# Patient Record
Sex: Female | Born: 1945 | Race: White | Hispanic: No | Smoking: Never smoker
Health system: Southern US, Community
[De-identification: ages and names within clinical notes are randomized; demographics above are authoritative.]

## PROBLEM LIST (undated history)

## (undated) DIAGNOSIS — N959 Unspecified menopausal and perimenopausal disorder: Secondary | ICD-10-CM

## (undated) DIAGNOSIS — M545 Low back pain, unspecified: Secondary | ICD-10-CM

## (undated) DIAGNOSIS — I495 Sick sinus syndrome: Secondary | ICD-10-CM

## (undated) DIAGNOSIS — E039 Hypothyroidism, unspecified: Secondary | ICD-10-CM

## (undated) DIAGNOSIS — M810 Age-related osteoporosis without current pathological fracture: Secondary | ICD-10-CM

## (undated) DIAGNOSIS — L259 Unspecified contact dermatitis, unspecified cause: Secondary | ICD-10-CM

## (undated) DIAGNOSIS — R252 Cramp and spasm: Secondary | ICD-10-CM

## (undated) DIAGNOSIS — E785 Hyperlipidemia, unspecified: Secondary | ICD-10-CM

## (undated) DIAGNOSIS — R12 Heartburn: Secondary | ICD-10-CM

## (undated) DIAGNOSIS — D151 Benign neoplasm of heart: Secondary | ICD-10-CM

## (undated) DIAGNOSIS — E119 Type 2 diabetes mellitus without complications: Secondary | ICD-10-CM

## (undated) DIAGNOSIS — F32A Depression, unspecified: Secondary | ICD-10-CM

## (undated) DIAGNOSIS — I10 Essential (primary) hypertension: Secondary | ICD-10-CM

## (undated) DIAGNOSIS — Z9861 Coronary angioplasty status: Secondary | ICD-10-CM

## (undated) DIAGNOSIS — I428 Other cardiomyopathies: Secondary | ICD-10-CM

## (undated) DIAGNOSIS — I498 Other specified cardiac arrhythmias: Secondary | ICD-10-CM

## (undated) DIAGNOSIS — I480 Paroxysmal atrial fibrillation: Secondary | ICD-10-CM

## (undated) DIAGNOSIS — T148XXA Other injury of unspecified body region, initial encounter: Secondary | ICD-10-CM

## (undated) DIAGNOSIS — I5022 Chronic systolic (congestive) heart failure: Secondary | ICD-10-CM

## (undated) DIAGNOSIS — I2510 Atherosclerotic heart disease of native coronary artery without angina pectoris: Secondary | ICD-10-CM

## (undated) HISTORY — DX: Atherosclerotic heart disease of native coronary artery without angina pectoris: I25.10

## (undated) HISTORY — DX: Unspecified menopausal and perimenopausal disorder: N95.9

## (undated) HISTORY — DX: Sick sinus syndrome: I49.5

## (undated) HISTORY — DX: Heartburn: R12

## (undated) HISTORY — DX: Cramp and spasm: R25.2

## (undated) HISTORY — DX: Other specified cardiac arrhythmias: I49.8

## (undated) HISTORY — DX: Type 2 diabetes mellitus without complications: E11.9

## (undated) HISTORY — DX: Depression, unspecified: F32.A

## (undated) HISTORY — DX: Benign neoplasm of heart: D15.1

## (undated) HISTORY — DX: Chronic systolic (congestive) heart failure: I50.22

## (undated) HISTORY — DX: Coronary angioplasty status: Z98.61

## (undated) HISTORY — PX: ATRIAL MYXOMA EXCISION: SHX1198

## (undated) HISTORY — DX: Unspecified contact dermatitis, unspecified cause: L25.9

## (undated) HISTORY — DX: Essential (primary) hypertension: I10

## (undated) HISTORY — DX: Low back pain: M54.5

## (undated) HISTORY — DX: Paroxysmal atrial fibrillation: I48.0

## (undated) HISTORY — DX: Age-related osteoporosis without current pathological fracture: M81.0

## (undated) HISTORY — PX: VAGINAL HYSTERECTOMY: SUR661

## (undated) HISTORY — DX: Hyperlipidemia, unspecified: E78.5

## (undated) HISTORY — DX: Hypothyroidism, unspecified: E03.9

## (undated) HISTORY — DX: Low back pain, unspecified: M54.50

## (undated) HISTORY — DX: Other cardiomyopathies: I42.8

## (undated) HISTORY — DX: Other injury of unspecified body region, initial encounter: T14.8XXA

---

## 2003-09-05 ENCOUNTER — Other Ambulatory Visit: Payer: Self-pay

## 2004-06-29 ENCOUNTER — Emergency Department: Payer: Self-pay | Admitting: Internal Medicine

## 2004-11-23 ENCOUNTER — Emergency Department: Payer: Self-pay | Admitting: Internal Medicine

## 2005-09-25 ENCOUNTER — Emergency Department: Payer: Self-pay | Admitting: Internal Medicine

## 2005-09-25 ENCOUNTER — Other Ambulatory Visit: Payer: Self-pay

## 2005-12-13 ENCOUNTER — Emergency Department: Payer: Self-pay | Admitting: Unknown Physician Specialty

## 2006-08-30 ENCOUNTER — Other Ambulatory Visit: Payer: Self-pay

## 2006-08-30 ENCOUNTER — Emergency Department: Payer: Self-pay | Admitting: General Practice

## 2007-05-29 ENCOUNTER — Emergency Department: Payer: Self-pay

## 2007-08-10 ENCOUNTER — Emergency Department: Payer: Self-pay | Admitting: Emergency Medicine

## 2007-10-26 ENCOUNTER — Emergency Department: Payer: Self-pay | Admitting: Emergency Medicine

## 2008-02-01 ENCOUNTER — Emergency Department: Payer: Self-pay | Admitting: Emergency Medicine

## 2008-09-05 ENCOUNTER — Emergency Department: Payer: Self-pay | Admitting: Emergency Medicine

## 2009-06-07 HISTORY — PX: CORONARY ARTERY BYPASS GRAFT: SHX141

## 2010-03-04 ENCOUNTER — Ambulatory Visit: Payer: Self-pay | Admitting: Gastroenterology

## 2010-03-06 LAB — PATHOLOGY REPORT

## 2010-06-07 HISTORY — PX: CARDIAC CATHETERIZATION: SHX172

## 2010-08-11 ENCOUNTER — Emergency Department: Payer: Self-pay | Admitting: Emergency Medicine

## 2011-07-07 ENCOUNTER — Ambulatory Visit: Payer: Self-pay | Admitting: Family Medicine

## 2011-07-07 DIAGNOSIS — Z1231 Encounter for screening mammogram for malignant neoplasm of breast: Secondary | ICD-10-CM | POA: Diagnosis not present

## 2011-10-01 DIAGNOSIS — I2510 Atherosclerotic heart disease of native coronary artery without angina pectoris: Secondary | ICD-10-CM | POA: Diagnosis not present

## 2011-10-01 DIAGNOSIS — E119 Type 2 diabetes mellitus without complications: Secondary | ICD-10-CM | POA: Diagnosis not present

## 2011-10-01 DIAGNOSIS — E039 Hypothyroidism, unspecified: Secondary | ICD-10-CM | POA: Diagnosis not present

## 2011-10-01 DIAGNOSIS — R12 Heartburn: Secondary | ICD-10-CM | POA: Diagnosis not present

## 2011-10-12 DIAGNOSIS — R011 Cardiac murmur, unspecified: Secondary | ICD-10-CM | POA: Diagnosis not present

## 2011-10-12 DIAGNOSIS — I209 Angina pectoris, unspecified: Secondary | ICD-10-CM | POA: Diagnosis not present

## 2011-10-12 DIAGNOSIS — R0609 Other forms of dyspnea: Secondary | ICD-10-CM | POA: Diagnosis not present

## 2012-01-19 DIAGNOSIS — E039 Hypothyroidism, unspecified: Secondary | ICD-10-CM | POA: Diagnosis not present

## 2012-01-19 DIAGNOSIS — E119 Type 2 diabetes mellitus without complications: Secondary | ICD-10-CM | POA: Diagnosis not present

## 2012-01-19 DIAGNOSIS — E785 Hyperlipidemia, unspecified: Secondary | ICD-10-CM | POA: Diagnosis not present

## 2012-01-19 DIAGNOSIS — I10 Essential (primary) hypertension: Secondary | ICD-10-CM | POA: Diagnosis not present

## 2012-01-19 DIAGNOSIS — R252 Cramp and spasm: Secondary | ICD-10-CM | POA: Diagnosis not present

## 2012-01-24 DIAGNOSIS — I2510 Atherosclerotic heart disease of native coronary artery without angina pectoris: Secondary | ICD-10-CM | POA: Diagnosis not present

## 2012-01-24 DIAGNOSIS — R0609 Other forms of dyspnea: Secondary | ICD-10-CM | POA: Diagnosis not present

## 2012-01-24 DIAGNOSIS — R0989 Other specified symptoms and signs involving the circulatory and respiratory systems: Secondary | ICD-10-CM | POA: Diagnosis not present

## 2012-03-08 DIAGNOSIS — E119 Type 2 diabetes mellitus without complications: Secondary | ICD-10-CM | POA: Diagnosis not present

## 2012-03-08 DIAGNOSIS — E039 Hypothyroidism, unspecified: Secondary | ICD-10-CM | POA: Diagnosis not present

## 2012-03-08 DIAGNOSIS — E785 Hyperlipidemia, unspecified: Secondary | ICD-10-CM | POA: Diagnosis not present

## 2012-03-08 DIAGNOSIS — Z23 Encounter for immunization: Secondary | ICD-10-CM | POA: Diagnosis not present

## 2012-03-08 DIAGNOSIS — I10 Essential (primary) hypertension: Secondary | ICD-10-CM | POA: Diagnosis not present

## 2012-07-11 DIAGNOSIS — Z0000 Encounter for general adult medical examination without abnormal findings: Secondary | ICD-10-CM | POA: Diagnosis not present

## 2012-07-11 DIAGNOSIS — Z1211 Encounter for screening for malignant neoplasm of colon: Secondary | ICD-10-CM | POA: Diagnosis not present

## 2012-07-11 DIAGNOSIS — Z1331 Encounter for screening for depression: Secondary | ICD-10-CM | POA: Diagnosis not present

## 2012-07-11 DIAGNOSIS — Z1239 Encounter for other screening for malignant neoplasm of breast: Secondary | ICD-10-CM | POA: Diagnosis not present

## 2012-07-11 DIAGNOSIS — Z1159 Encounter for screening for other viral diseases: Secondary | ICD-10-CM | POA: Diagnosis not present

## 2012-07-26 DIAGNOSIS — I209 Angina pectoris, unspecified: Secondary | ICD-10-CM | POA: Diagnosis not present

## 2012-07-26 DIAGNOSIS — R011 Cardiac murmur, unspecified: Secondary | ICD-10-CM | POA: Diagnosis not present

## 2012-08-28 DIAGNOSIS — I2510 Atherosclerotic heart disease of native coronary artery without angina pectoris: Secondary | ICD-10-CM | POA: Diagnosis not present

## 2012-08-28 DIAGNOSIS — I10 Essential (primary) hypertension: Secondary | ICD-10-CM | POA: Diagnosis not present

## 2012-08-28 DIAGNOSIS — I209 Angina pectoris, unspecified: Secondary | ICD-10-CM | POA: Diagnosis not present

## 2012-08-30 ENCOUNTER — Ambulatory Visit: Payer: Self-pay | Admitting: General Surgery

## 2012-08-30 DIAGNOSIS — Z5309 Procedure and treatment not carried out because of other contraindication: Secondary | ICD-10-CM | POA: Diagnosis not present

## 2012-08-30 DIAGNOSIS — Z7982 Long term (current) use of aspirin: Secondary | ICD-10-CM | POA: Diagnosis not present

## 2012-08-30 DIAGNOSIS — Z1211 Encounter for screening for malignant neoplasm of colon: Secondary | ICD-10-CM

## 2012-08-30 DIAGNOSIS — E119 Type 2 diabetes mellitus without complications: Secondary | ICD-10-CM | POA: Diagnosis not present

## 2012-08-30 DIAGNOSIS — Z79899 Other long term (current) drug therapy: Secondary | ICD-10-CM | POA: Diagnosis not present

## 2012-08-30 DIAGNOSIS — Z951 Presence of aortocoronary bypass graft: Secondary | ICD-10-CM | POA: Diagnosis not present

## 2012-08-30 DIAGNOSIS — K644 Residual hemorrhoidal skin tags: Secondary | ICD-10-CM | POA: Diagnosis not present

## 2012-08-30 DIAGNOSIS — I10 Essential (primary) hypertension: Secondary | ICD-10-CM | POA: Diagnosis not present

## 2012-08-30 DIAGNOSIS — K5730 Diverticulosis of large intestine without perforation or abscess without bleeding: Secondary | ICD-10-CM

## 2012-08-30 DIAGNOSIS — E7800 Pure hypercholesterolemia, unspecified: Secondary | ICD-10-CM | POA: Diagnosis not present

## 2012-09-04 ENCOUNTER — Encounter: Payer: Self-pay | Admitting: General Surgery

## 2012-09-05 ENCOUNTER — Ambulatory Visit: Payer: Self-pay | Admitting: Family Medicine

## 2012-09-05 DIAGNOSIS — M545 Low back pain, unspecified: Secondary | ICD-10-CM | POA: Diagnosis not present

## 2012-09-05 DIAGNOSIS — I10 Essential (primary) hypertension: Secondary | ICD-10-CM | POA: Diagnosis not present

## 2012-09-05 DIAGNOSIS — E039 Hypothyroidism, unspecified: Secondary | ICD-10-CM | POA: Diagnosis not present

## 2012-09-05 DIAGNOSIS — E119 Type 2 diabetes mellitus without complications: Secondary | ICD-10-CM | POA: Diagnosis not present

## 2012-09-05 DIAGNOSIS — M539 Dorsopathy, unspecified: Secondary | ICD-10-CM | POA: Diagnosis not present

## 2012-09-21 DIAGNOSIS — I498 Other specified cardiac arrhythmias: Secondary | ICD-10-CM | POA: Diagnosis not present

## 2012-09-21 DIAGNOSIS — I10 Essential (primary) hypertension: Secondary | ICD-10-CM | POA: Diagnosis not present

## 2012-10-20 DIAGNOSIS — I2510 Atherosclerotic heart disease of native coronary artery without angina pectoris: Secondary | ICD-10-CM | POA: Diagnosis not present

## 2012-10-20 DIAGNOSIS — R252 Cramp and spasm: Secondary | ICD-10-CM | POA: Diagnosis not present

## 2012-10-20 DIAGNOSIS — I10 Essential (primary) hypertension: Secondary | ICD-10-CM | POA: Diagnosis not present

## 2012-11-20 DIAGNOSIS — T148XXA Other injury of unspecified body region, initial encounter: Secondary | ICD-10-CM | POA: Diagnosis not present

## 2012-11-20 DIAGNOSIS — N959 Unspecified menopausal and perimenopausal disorder: Secondary | ICD-10-CM | POA: Diagnosis not present

## 2012-11-20 DIAGNOSIS — I10 Essential (primary) hypertension: Secondary | ICD-10-CM | POA: Diagnosis not present

## 2012-11-22 ENCOUNTER — Ambulatory Visit: Payer: Self-pay | Admitting: Family Medicine

## 2012-11-22 DIAGNOSIS — Z780 Asymptomatic menopausal state: Secondary | ICD-10-CM | POA: Diagnosis not present

## 2012-11-22 DIAGNOSIS — N959 Unspecified menopausal and perimenopausal disorder: Secondary | ICD-10-CM | POA: Diagnosis not present

## 2012-11-22 DIAGNOSIS — M810 Age-related osteoporosis without current pathological fracture: Secondary | ICD-10-CM | POA: Diagnosis not present

## 2012-12-21 DIAGNOSIS — R000 Tachycardia, unspecified: Secondary | ICD-10-CM | POA: Diagnosis not present

## 2012-12-21 DIAGNOSIS — M810 Age-related osteoporosis without current pathological fracture: Secondary | ICD-10-CM | POA: Diagnosis not present

## 2012-12-21 DIAGNOSIS — I10 Essential (primary) hypertension: Secondary | ICD-10-CM | POA: Diagnosis not present

## 2013-01-01 ENCOUNTER — Encounter: Payer: Self-pay | Admitting: *Deleted

## 2013-01-12 ENCOUNTER — Encounter: Payer: Self-pay | Admitting: Cardiovascular Disease

## 2013-01-12 ENCOUNTER — Telehealth: Payer: Self-pay | Admitting: *Deleted

## 2013-01-12 ENCOUNTER — Ambulatory Visit (INDEPENDENT_AMBULATORY_CARE_PROVIDER_SITE_OTHER): Payer: Medicare Other | Admitting: Cardiovascular Disease

## 2013-01-12 VITALS — BP 134/84 | HR 49 | Ht 59.0 in | Wt 152.5 lb

## 2013-01-12 DIAGNOSIS — R002 Palpitations: Secondary | ICD-10-CM | POA: Diagnosis not present

## 2013-01-12 DIAGNOSIS — I10 Essential (primary) hypertension: Secondary | ICD-10-CM | POA: Insufficient documentation

## 2013-01-12 DIAGNOSIS — R000 Tachycardia, unspecified: Secondary | ICD-10-CM | POA: Diagnosis not present

## 2013-01-12 DIAGNOSIS — I2510 Atherosclerotic heart disease of native coronary artery without angina pectoris: Secondary | ICD-10-CM | POA: Insufficient documentation

## 2013-01-12 DIAGNOSIS — D151 Benign neoplasm of heart: Secondary | ICD-10-CM | POA: Diagnosis not present

## 2013-01-12 MED ORDER — METOPROLOL SUCCINATE ER 50 MG PO TB24: 50.0000 mg | ORAL_TABLET | Freq: Two times a day (BID) | ORAL | Status: DC

## 2013-01-12 NOTE — Telephone Encounter (Signed)
Faxed order to [facility] for pt to receive 48 hour holter monitor. Elaine/Kayron will contact pt and let her know when she can come in to have 48 hour holter monitor placed.

## 2013-01-12 NOTE — Assessment & Plan Note (Signed)
She had recurrent atypical chest pain since her CABG. Most recent stress test was in March of this year which showed no evidence of ischemia with normal ejection fraction.

## 2013-01-12 NOTE — Telephone Encounter (Signed)
Message copied by Kendrick Fries on Fri Jan 12, 2013 12:11 PM ------      Message from: Oneida Arenas      Created: Fri Jan 12, 2013 11:42 AM      Regarding: Holter Monitor       Needs 48 hour holter monitor from [facility]. ------

## 2013-01-12 NOTE — Assessment & Plan Note (Signed)
The etiology of this is and not entirely clear. Although his symptoms improved after increasing the dose of Toprol, she is now ready bradycardic with a heart rate of 49 beats per minute. Thus, I will decrease the dose back to 50 mg once daily and request a 48-hour Holter monitor for evaluation.

## 2013-01-12 NOTE — Progress Notes (Signed)
HPI  This is a 67 year old female who was referred by Dr. Thana Ates for evaluation of palpitations and tachycardia. She had previous atrial myxoma resection and one-vessel CABG in 2012 done at [facility]. She has multiple chronic medical conditions including hypertension, hyperlipidemia and diabetes. She had recurrent chest pain post CABG. She underwent a nuclear stress test in March of 2014 by Dr. Juliann Pares, her previous cardiologist which showed no evidence of ischemia with normal ejection fraction. She reports chest pain when she is under stress. She has been having palpitations. It was mentioned that she was tachycardic during a recent routine visit. However, by vital signs and heart rate was 80 beats per minute. Due to her symptoms, the dose of Toprol was increased to 100 mg once daily. She reports improvement and palpitations but she is more tired with a heart rate of 49 beats per minute today. She complains of exertional dyspnea. No orthopnea or PND.  No Known Allergies   Current Outpatient Prescriptions on File Prior to Visit  Medication Sig Dispense Refill  . amLODipine (NORVASC) 10 MG tablet Take 10 mg by mouth daily.      Marland Kitchen aspirin 81 MG tablet Take 81 mg by mouth daily.      Marland Kitchen atorvastatin (LIPITOR) 80 MG tablet Take 80 mg by mouth daily.      . chlorthalidone (HYGROTON) 25 MG tablet Take 25 mg by mouth daily.      . furosemide (LASIX) 20 MG tablet Take 20 mg by mouth as needed.      Marland Kitchen levothyroxine (SYNTHROID, LEVOTHROID) 50 MCG tablet Take 50 mcg by mouth daily before breakfast.      . magnesium oxide (MAG-OX) 400 MG tablet Take 400 mg by mouth 2 (two) times daily.      . metFORMIN (GLUCOPHAGE) 500 MG tablet Take 500 mg by mouth daily with breakfast.      . ranitidine (ZANTAC) 300 MG tablet Take 300 mg by mouth at bedtime.       No current facility-administered medications on file prior to visit.     Past Medical History  Diagnosis Date  . Postsurgical percutaneous transluminal  coronary angioplasty status   . Type II or unspecified type diabetes mellitus without mention of complication, not stated as uncontrolled   . Other and unspecified hyperlipidemia   . Osteoporosis, unspecified   . Lumbago   . Unspecified hypothyroidism   . Contusion of unspecified site   . Unspecified menopausal and postmenopausal disorder   . Cramp of limb   . Heartburn   . Contact dermatitis and other eczema, due to unspecified cause   . Other specified cardiac dysrhythmias(427.89)   . Benign neoplasm of heart   . Essential hypertension, benign   . Atrial myxoma     Status post resection in 2012 at [facility]  . Coronary atherosclerosis of unspecified type of vessel, native or graft     Status post CABG in March of 2012 at the time of atrial myxoma resection with LIMA to LAD done at [facility]     Past Surgical History  Procedure Laterality Date  . Atrial myxoma excision Left   . Vaginal hysterectomy    . Cardiac catheterization  2011    [facility]; right and left heart 75% ostial lesion  . Coronary artery bypass graft  2011    [facility]     Family History  Problem Relation Age of Onset  . Heart failure Mother      History  Social History  . Marital Status: Married    Spouse Name: N/A    Number of Children: N/A  . Years of Education: N/A   Occupational History  . Not on file.   Social History Main Topics  . Smoking status: Never Smoker   . Smokeless tobacco: Not on file  . Alcohol Use: No  . Drug Use: No  . Sexually Active: Not on file   Other Topics Concern  . Not on file   Social History Narrative  . No narrative on file     ROS Constitutional: Negative for fever, chills, diaphoresis, activity change, appetite change and fatigue.  HENT: Negative for hearing loss, nosebleeds, congestion, sore throat, facial swelling, drooling, trouble swallowing, neck pain, voice change, sinus pressure and tinnitus.  Eyes: Negative for photophobia, pain, discharge and visual disturbance.    Respiratory: Negative for apnea, cough and wheezing.  Cardiovascular: Negative for  leg swelling.  Gastrointestinal: Negative for nausea, vomiting, abdominal pain, diarrhea, constipation, blood in stool and abdominal distention.  Genitourinary: Negative for dysuria, urgency, frequency, hematuria and decreased urine volume.  Musculoskeletal: Negative for myalgias, back pain, joint swelling, arthralgias and gait problem.  Skin: Negative for color change, pallor, rash and wound.  Neurological: Negative for dizziness, tremors, seizures, syncope, speech difficulty, weakness, light-headedness, numbness and headaches.  Psychiatric/Behavioral: Negative for suicidal ideas, hallucinations, behavioral problems and agitation. The patient is not nervous/anxious.     PHYSICAL EXAM   BP 134/84  Pulse 49  Ht 4\' 11"  (1.499 m)  Wt 152 lb 8 oz (69.174 kg)  BMI 30.79 kg/m2 Constitutional: She is oriented to person, place, and time. She appears well-developed and well-nourished. No distress.  HENT: No nasal discharge.  Head: Normocephalic and atraumatic.  Eyes: Pupils are equal and round. Right eye exhibits no discharge. Left eye exhibits no discharge.  Neck: Normal range of motion. Neck supple. No JVD present. No thyromegaly present.  Cardiovascular: Normal rate, regular rhythm, normal heart sounds. Exam reveals no gallop and no friction rub. No murmur heard.  Pulmonary/Chest: Effort normal and breath sounds normal. No stridor. No respiratory distress. She has no wheezes. She has no rales. She exhibits no tenderness.  Abdominal: Soft. Bowel sounds are normal. She exhibits no distension. There is no tenderness. There is no rebound and no guarding.  Musculoskeletal: Normal range of motion. She exhibits no edema and no tenderness.  Neurological: She is alert and oriented to person, place, and time. Coordination normal.  Skin: Skin is warm and dry. No rash noted. She is not diaphoretic. No erythema. No  pallor.  Psychiatric: She has a normal mood and affect. Her behavior is normal. Judgment and thought content normal.     EKG: Marked   sinus Bradycardia  -  Nonspecific T-abnormality.   ABNORMAL     ASSESSMENT AND PLAN

## 2013-01-12 NOTE — Patient Instructions (Addendum)
Decrease Metoprolol (Toprol) back to 50 mg once daily.   Your physician has requested that you have an echocardiogram. Echocardiography is a painless test that uses sound waves to create images of your heart. It provides your doctor with information about the size and shape of your heart and how well your heart's chambers and valves are working. This procedure takes approximately one hour. There are no restrictions for this procedure.  Your physician has recommended that you wear a holter monitor. Holter monitors are medical devices that record the heart's electrical activity. Doctors most often use these monitors to diagnose arrhythmias. Arrhythmias are problems with the speed or rhythm of the heartbeat. The monitor is a small, portable device. You can wear one while you do your normal daily activities. This is usually used to diagnose what is causing palpitations/syncope (passing out).  Follow up in 1 month

## 2013-01-12 NOTE — Assessment & Plan Note (Signed)
She had previous atrial myxoma resection. A yearly echocardiogram is recommended to ensure no recurrent tumor. I requested an echocardiogram for evaluation.

## 2013-01-13 IMAGING — MG MM CAD SCREENING MAMMO
1 series · 5 of 5 positions shown · non-contrast
Comparison: none

REASON FOR EXAM: scr mammo no order
COMMENTS:

[R CC · right · 5 of 5 slices shown]
[im 1/5]
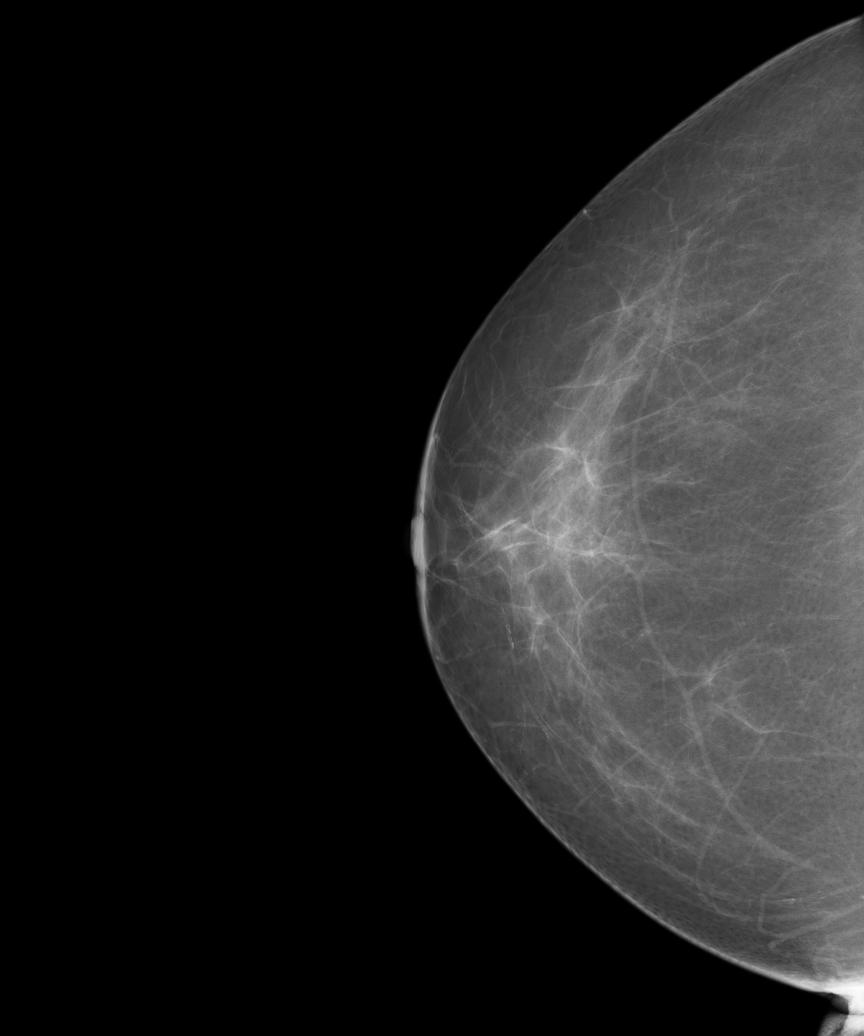
[im 2/5]
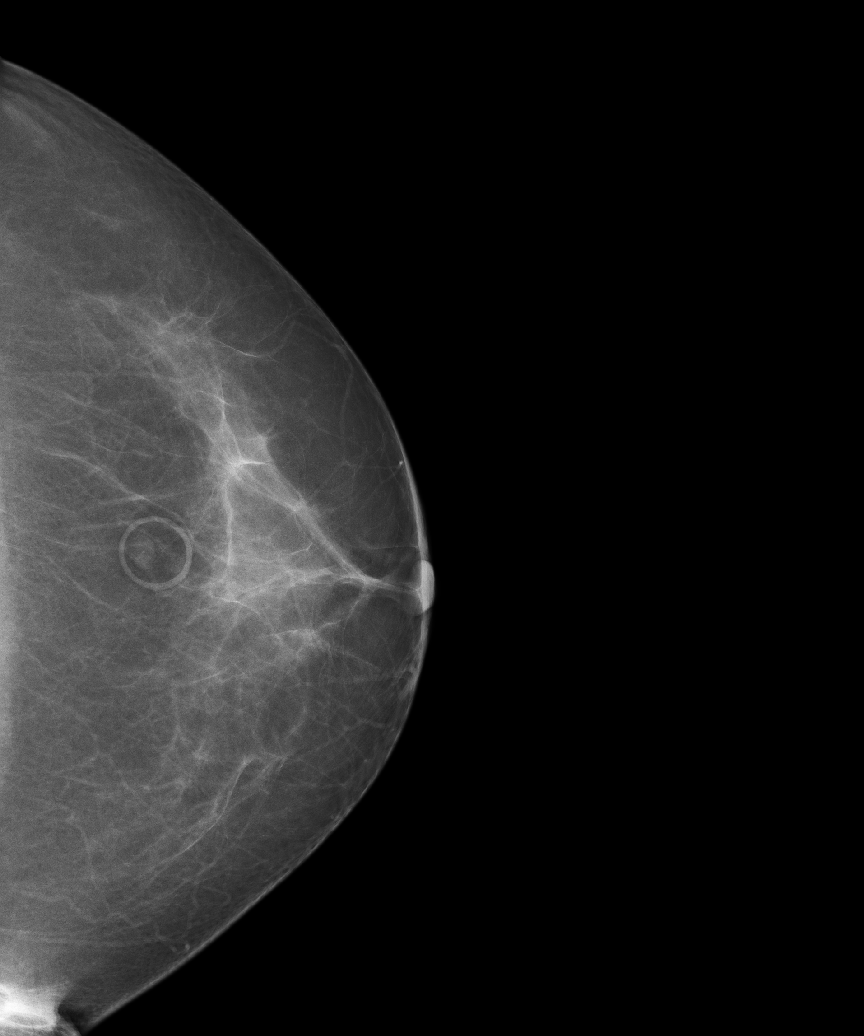
[im 3/5]
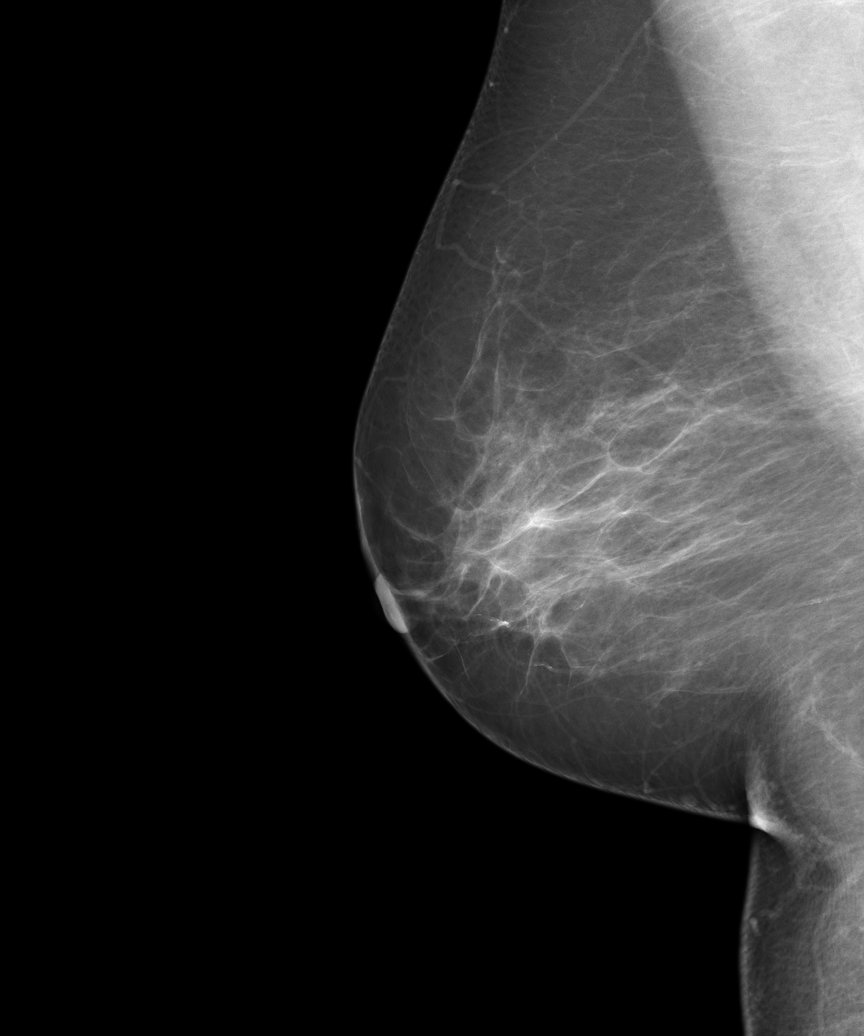
[im 4/5]
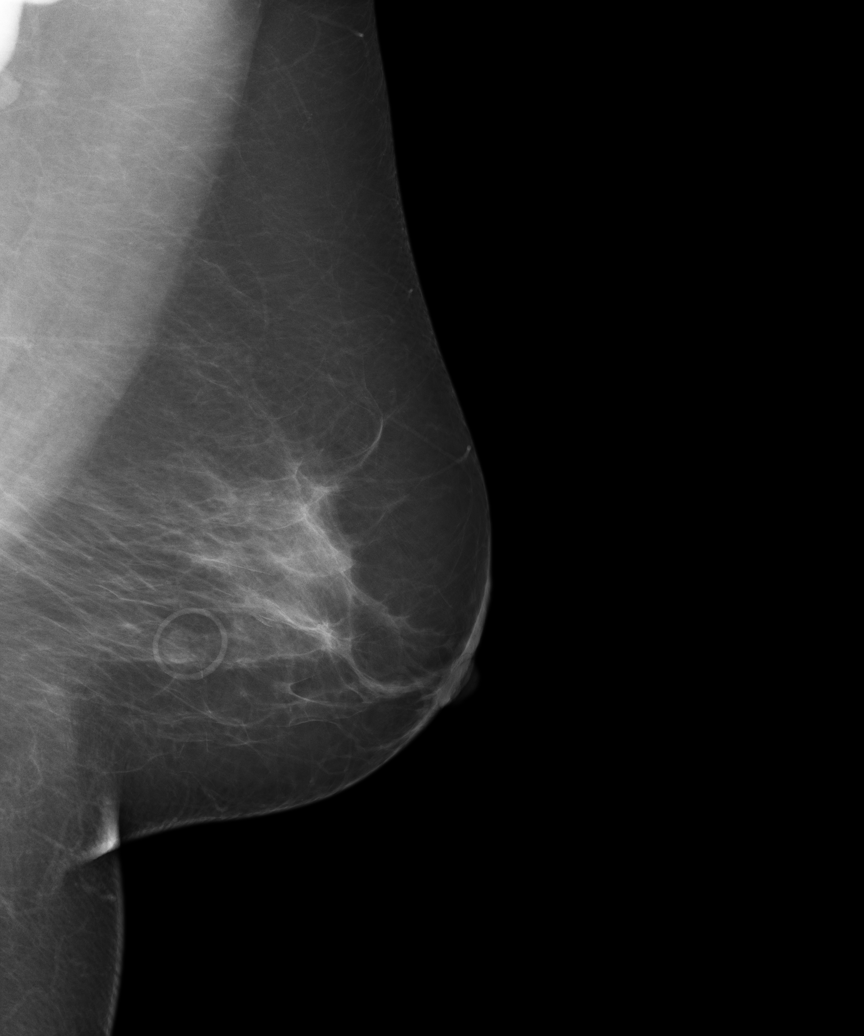
[im 5/5]
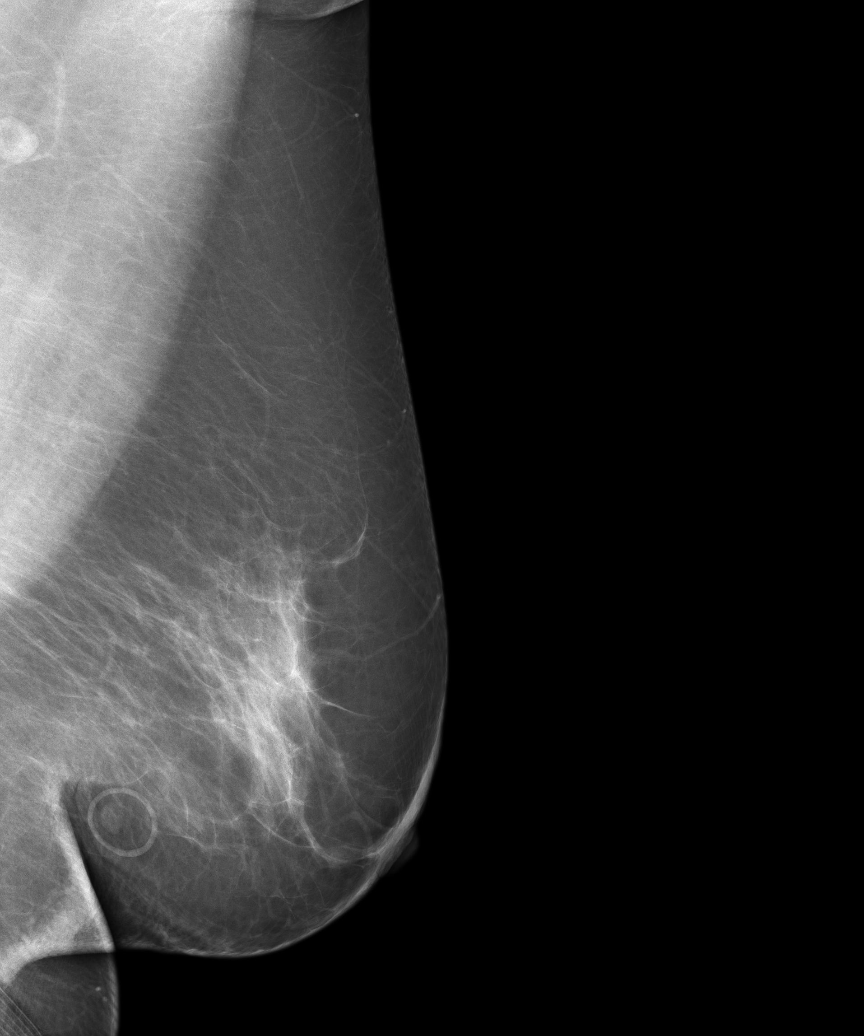

[5 of 5 positions shown; findings below may reference images not displayed]

PROCEDURE:     MAM - MAM DGTL SCRN MAM NO ORDER W/CAD  - July 07, 2011  [DATE]

RESULT:     This is a baseline mammogram. There is no known family history
of breast cancer. The breasts exhibit a mild parenchymal density. A skin
lesion is marked in the 6 o'clock region of the left breast. There is no
evidence of an area of dominant mass or malignant-appearing calcification.
No architectural distortion is present.
IMPRESSION: 1. Benign-appearing bilateral mammogram.

BI-RADS: Category 1 - Negative

RECOMMENDATIONS:

1. Please continue to encourage appropriate mammographic follow-up.

A NEGATIVE MAMMOGRAM REPORT DOES NOT PRECLUDE BIOPSY OR OTHER EVALUATION OF
A CLINICALLY PALPABLE OR OTHERWISE SUSPICIOUS MASS OR LESION. BREAST CANCER
MAY NOT BE DETECTED BY MAMMOGRAPHY IN UP TO 10% OF CASES.

## 2013-01-16 ENCOUNTER — Other Ambulatory Visit: Payer: Medicare Other

## 2013-01-24 DIAGNOSIS — I10 Essential (primary) hypertension: Secondary | ICD-10-CM | POA: Diagnosis not present

## 2013-01-24 DIAGNOSIS — E119 Type 2 diabetes mellitus without complications: Secondary | ICD-10-CM | POA: Diagnosis not present

## 2013-01-24 DIAGNOSIS — E039 Hypothyroidism, unspecified: Secondary | ICD-10-CM | POA: Diagnosis not present

## 2013-01-24 DIAGNOSIS — E785 Hyperlipidemia, unspecified: Secondary | ICD-10-CM | POA: Diagnosis not present

## 2013-01-30 ENCOUNTER — Other Ambulatory Visit: Payer: Self-pay

## 2013-01-30 ENCOUNTER — Other Ambulatory Visit (INDEPENDENT_AMBULATORY_CARE_PROVIDER_SITE_OTHER): Payer: Medicare Other

## 2013-01-30 DIAGNOSIS — I059 Rheumatic mitral valve disease, unspecified: Secondary | ICD-10-CM | POA: Diagnosis not present

## 2013-01-30 DIAGNOSIS — R079 Chest pain, unspecified: Secondary | ICD-10-CM | POA: Diagnosis not present

## 2013-01-30 DIAGNOSIS — R002 Palpitations: Secondary | ICD-10-CM

## 2013-02-04 ENCOUNTER — Encounter: Payer: Self-pay | Admitting: Cardiovascular Disease

## 2013-02-05 HISTORY — PX: INSERT / REPLACE / REMOVE PACEMAKER: SUR710

## 2013-02-09 DIAGNOSIS — I209 Angina pectoris, unspecified: Secondary | ICD-10-CM | POA: Diagnosis not present

## 2013-02-09 DIAGNOSIS — I10 Essential (primary) hypertension: Secondary | ICD-10-CM | POA: Diagnosis not present

## 2013-02-09 DIAGNOSIS — I2510 Atherosclerotic heart disease of native coronary artery without angina pectoris: Secondary | ICD-10-CM | POA: Diagnosis not present

## 2013-02-12 ENCOUNTER — Encounter: Payer: Self-pay | Admitting: Cardiovascular Disease

## 2013-02-12 ENCOUNTER — Ambulatory Visit (INDEPENDENT_AMBULATORY_CARE_PROVIDER_SITE_OTHER): Payer: Medicare Other | Admitting: Cardiovascular Disease

## 2013-02-12 VITALS — BP 141/78 | HR 51 | Ht 60.0 in | Wt 156.0 lb

## 2013-02-12 DIAGNOSIS — R002 Palpitations: Secondary | ICD-10-CM | POA: Diagnosis not present

## 2013-02-12 DIAGNOSIS — I10 Essential (primary) hypertension: Secondary | ICD-10-CM | POA: Diagnosis not present

## 2013-02-12 DIAGNOSIS — I2510 Atherosclerotic heart disease of native coronary artery without angina pectoris: Secondary | ICD-10-CM

## 2013-02-12 DIAGNOSIS — D151 Benign neoplasm of heart: Secondary | ICD-10-CM | POA: Diagnosis not present

## 2013-02-12 NOTE — Patient Instructions (Addendum)
Your physician recommends that you continue on your current medications as directed. Please refer to the Current Medication list given to you today.  Your physician has recommended that you wear an 30 day event monitor. Event monitors are medical devices that record the heart's electrical activity. Doctors most often [place] these monitors to diagnose arrhythmias. Arrhythmias are problems with the speed or rhythm of the heartbeat. The monitor is a small, portable device. You can wear one while you do your normal daily activities. This is usually used to diagnose what is causing palpitations/syncope (passing out).  Your physician recommends that you schedule a follow-up appointment in: 2 months with Dr. Kirke Corin

## 2013-02-12 NOTE — Assessment & Plan Note (Signed)
Recent echo showed no evidence of recurrent myxoma.

## 2013-02-12 NOTE — Assessment & Plan Note (Signed)
The patient is having intermittent episodes of tachycardia of unclear etiology. Symptoms include palpitations and dizziness without syncope or presyncope. She was noted to be tachycardic during the echocardiogram. It was possibly multifocal atrial tachycardia given that the rhythm was irregular but A waves were present on Doppler. This makes it fibrillation less likely. Current episodes are happening at an average of once weekly. Thus, a Holter monitor will probably be not sufficient to capture this.  Thus, I recommend a two-week outpatient telemetry. Continue current dose of Toprol. She has baseline bradycardia.

## 2013-02-12 NOTE — Assessment & Plan Note (Signed)
She has no symptoms suggestive of angina. Continue medical therapy. 

## 2013-02-12 NOTE — Progress Notes (Signed)
HPI  This is a 67 year old female who is here today for a followup visit regarding palpitations and tachycardia. She had previous atrial myxoma resection and one-vessel CABG in 2012 done at [facility]. She has known nonischemic cardiomyopathy with previous ejection fraction of 45%. She has multiple chronic medical conditions including hypertension, hyperlipidemia and diabetes. She had recurrent chest pain post CABG. She underwent a nuclear stress test in March of 2014 by Dr. Juliann Pares, her previous cardiologist which showed no evidence of ischemia with normal ejection fraction. She reports chest pain when she is under stress.  She was seen recently for palpitations and intermittent tachycardia. Toprol was increased to 100 mg once daily. During my initial evaluation she was noted to be bradycardic with a heart rate of  49 beats per minute with significant fatigue. Thus, I decreased the dose of Toprol back to 50 mg once daily. I scheduled her for an echocardiogram which showed mildly reduced LV systolic function with an EF of 45-50% which is stable with mild to moderate mitral regurgitation and moderate tricuspid regurgitation. She was noted to be tachycardic during the echocardiogram with a heart rate around 110 beats per minute. She reports having palpitations and tachycardia when she is upset and under distress. I requested a 48-hour Holter monitor but she never picked up the monitor .  She reports now that her symptoms are happening on average of once a week. She feels fast heartbeats associated with mild dizziness but no syncope or presyncope.    No Known Allergies   Current Outpatient Prescriptions on File Prior to Visit  Medication Sig Dispense Refill  . amLODipine (NORVASC) 10 MG tablet Take 10 mg by mouth daily.      Marland Kitchen aspirin 81 MG tablet Take 81 mg by mouth daily.      Marland Kitchen atorvastatin (LIPITOR) 80 MG tablet Take 80 mg by mouth daily.      . chlorthalidone (HYGROTON) 25 MG tablet Take 25 mg by mouth  daily.      . furosemide (LASIX) 20 MG tablet Take 20 mg by mouth as needed.      Marland Kitchen levothyroxine (SYNTHROID, LEVOTHROID) 50 MCG tablet Take 50 mcg by mouth daily before breakfast.      . magnesium oxide (MAG-OX) 400 MG tablet Take 400 mg by mouth 2 (two) times daily.      . metFORMIN (GLUCOPHAGE) 500 MG tablet Take 500 mg by mouth daily with breakfast.      . metoprolol succinate (TOPROL-XL) 50 MG 24 hr tablet Take 1 tablet (50 mg total) by mouth 2 (two) times daily. Take with or immediately following a meal.      . ranitidine (ZANTAC) 300 MG tablet Take 300 mg by mouth at bedtime.       No current facility-administered medications on file prior to visit.     Past Medical History  Diagnosis Date  . Postsurgical percutaneous transluminal coronary angioplasty status   . Type II or unspecified type diabetes mellitus without mention of complication, not stated as uncontrolled   . Other and unspecified hyperlipidemia   . Osteoporosis, unspecified   . Lumbago   . Unspecified hypothyroidism   . Contusion of unspecified site   . Unspecified menopausal and postmenopausal disorder   . Cramp of limb   . Heartburn   . Contact dermatitis and other eczema, due to unspecified cause   . Other specified cardiac dysrhythmias(427.89)   . Benign neoplasm of heart   . Essential hypertension, benign   .  Atrial myxoma     Status post resection in 2012 at [facility]  . Coronary atherosclerosis of unspecified type of vessel, native or graft     Status post CABG in March of 2012 at the time of atrial myxoma resection with LIMA to LAD done at [facility]  . Nonischemic cardiomyopathy     EF: 45% in 2012     Past Surgical History  Procedure Laterality Date  . Atrial myxoma excision Left   . Vaginal hysterectomy    . Coronary artery bypass graft  2011    [facility]  . Cardiac catheterization  2012    [facility]; right and left heart 75% ostial LAD lesion     Family History  Problem Relation Age of Onset  . Heart failure  Mother      History   Social History  . Marital Status: Married    Spouse Name: N/A    Number of Children: N/A  . Years of Education: N/A   Occupational History  . Not on file.   Social History Main Topics  . Smoking status: Never Smoker   . Smokeless tobacco: Not on file  . Alcohol Use: No  . Drug Use: No  . Sexual Activity: Not on file   Other Topics Concern  . Not on file   Social History Narrative  . No narrative on file       PHYSICAL EXAM   There were no vitals taken for this visit. Constitutional: She is oriented to person, place, and time. She appears well-developed and well-nourished. No distress.  HENT: No nasal discharge.  Head: Normocephalic and atraumatic.  Eyes: Pupils are equal and round. Right eye exhibits no discharge. Left eye exhibits no discharge.  Neck: Normal range of motion. Neck supple. No JVD present. No thyromegaly present.  Cardiovascular: Normal rate, regular rhythm, normal heart sounds. Exam reveals no gallop and no friction rub. No murmur heard.  Pulmonary/Chest: Effort normal and breath sounds normal. No stridor. No respiratory distress. She has no wheezes. She has no rales. She exhibits no tenderness.  Abdominal: Soft. Bowel sounds are normal. She exhibits no distension. There is no tenderness. There is no rebound and no guarding.  Musculoskeletal: Normal range of motion. She exhibits no edema and no tenderness.  Neurological: She is alert and oriented to person, place, and time. Coordination normal.  Skin: Skin is warm and dry. No rash noted. She is not diaphoretic. No erythema. No pallor.  Psychiatric: She has a normal mood and affect. Her behavior is normal. Judgment and thought content normal.     EKG: Sinus bradycardia with PACs and sinus arrhythmia -  Nonspecific T-abnormality.   Low voltage -possible pulmonary disease.   ABNORMAL    ASSESSMENT AND PLAN

## 2013-02-13 ENCOUNTER — Telehealth: Payer: Self-pay

## 2013-02-13 NOTE — Telephone Encounter (Signed)
Message copied by Marilynne Halsted on Tue Feb 13, 2013  9:31 AM ------      Message from: Lorine Bears A      Created: Sun Feb 04, 2013  1:18 PM       Inform patient that echo showed stable heart function. Heart rate was high during the study. I need to review her Holter monitor. Do we have it yet? ------

## 2013-02-13 NOTE — Telephone Encounter (Signed)
Message copied by Marilynne Halsted on Tue Feb 13, 2013  9:32 AM ------      Message from: Felicia Poole      Created: Sun Feb 04, 2013  1:18 PM       Inform patient that echo showed stable heart function. Heart rate was high during the study. I need to review her Holter monitor. Do we have it yet? ------

## 2013-02-13 NOTE — Telephone Encounter (Signed)
Pt came in for visit w/ Dr. Kirke Corin 02/12/13.  Has not worn monitor yet.

## 2013-02-15 DIAGNOSIS — R002 Palpitations: Secondary | ICD-10-CM

## 2013-02-20 DIAGNOSIS — R002 Palpitations: Secondary | ICD-10-CM | POA: Diagnosis not present

## 2013-02-21 ENCOUNTER — Telehealth: Payer: Self-pay

## 2013-02-21 NOTE — Telephone Encounter (Signed)
Spoke w/ pt's daughter.  She did not want to make an appt until she talked to her husband and asked if she could call our office next week. Explained the gravity of her mother's condition and the need for a pacemaker.  Daughter asked if she could call in the morning.  Dr. Graciela Husbands spoke with pt and explained situation in detail. Daughter asked if she could take mother to [facility]. Dr. Graciela Husbands asked me to copy pt's [facility] report and leave for pt at front desk to pick up.

## 2013-02-21 NOTE — Telephone Encounter (Signed)
After multiple attempts, spoke w/ pt's daughter.  She states that pt is sleeping at the moment. Pt did not complain of any type of symptom during episode reported to [facility] last night and did not want to schedule an appt before November. Instructed her that if pt has any symptoms of syncope or dizziness to call 911.

## 2013-02-21 NOTE — Telephone Encounter (Signed)
To Dr. Kirke Corin

## 2013-02-21 NOTE — Telephone Encounter (Signed)
Received call from [facility] that pt had instance of atrial flutter with heart rate reaching 163. They will fax report to our office. Spoke w/ daughter who states that pt is resting comfortably with no symptoms of any kind.

## 2013-02-21 NOTE — Telephone Encounter (Signed)
She needs to be seen by EP ASAP  for consultation regarding need for pacemaker. IF we treat the fast heart beats, we might slow down the heart too much. Thus, a pacemaker is needed.

## 2013-02-21 NOTE — Telephone Encounter (Signed)
Spoke w/ Dr. Kirke Corin about e[facility] report; 9/16 8:19pm, a fib w/ 3.3 sec pause.   Instructed to call and check on pt to see if she experienced any dizziness or other symptoms at that time.  No answer, left message to call back as soon as possible.

## 2013-02-22 ENCOUNTER — Telehealth: Payer: Self-pay | Admitting: Physician Assistant

## 2013-02-22 ENCOUNTER — Telehealth: Payer: Self-pay | Admitting: Cardiovascular Disease

## 2013-02-22 ENCOUNTER — Telehealth: Payer: Self-pay | Admitting: *Deleted

## 2013-02-22 DIAGNOSIS — I517 Cardiomegaly: Secondary | ICD-10-CM | POA: Diagnosis not present

## 2013-02-22 DIAGNOSIS — E785 Hyperlipidemia, unspecified: Secondary | ICD-10-CM | POA: Diagnosis present

## 2013-02-22 DIAGNOSIS — E119 Type 2 diabetes mellitus without complications: Secondary | ICD-10-CM | POA: Insufficient documentation

## 2013-02-22 DIAGNOSIS — Z951 Presence of aortocoronary bypass graft: Secondary | ICD-10-CM | POA: Diagnosis not present

## 2013-02-22 DIAGNOSIS — M810 Age-related osteoporosis without current pathological fracture: Secondary | ICD-10-CM | POA: Diagnosis present

## 2013-02-22 DIAGNOSIS — I495 Sick sinus syndrome: Secondary | ICD-10-CM | POA: Insufficient documentation

## 2013-02-22 DIAGNOSIS — I519 Heart disease, unspecified: Secondary | ICD-10-CM | POA: Diagnosis not present

## 2013-02-22 DIAGNOSIS — R079 Chest pain, unspecified: Secondary | ICD-10-CM | POA: Diagnosis not present

## 2013-02-22 DIAGNOSIS — I5020 Unspecified systolic (congestive) heart failure: Secondary | ICD-10-CM | POA: Diagnosis not present

## 2013-02-22 DIAGNOSIS — I498 Other specified cardiac arrhythmias: Secondary | ICD-10-CM | POA: Diagnosis not present

## 2013-02-22 DIAGNOSIS — I4430 Unspecified atrioventricular block: Secondary | ICD-10-CM | POA: Diagnosis not present

## 2013-02-22 DIAGNOSIS — I10 Essential (primary) hypertension: Secondary | ICD-10-CM | POA: Diagnosis present

## 2013-02-22 DIAGNOSIS — E039 Hypothyroidism, unspecified: Secondary | ICD-10-CM | POA: Diagnosis not present

## 2013-02-22 DIAGNOSIS — I499 Cardiac arrhythmia, unspecified: Secondary | ICD-10-CM | POA: Diagnosis not present

## 2013-02-22 DIAGNOSIS — R918 Other nonspecific abnormal finding of lung field: Secondary | ICD-10-CM | POA: Diagnosis not present

## 2013-02-22 DIAGNOSIS — I4581 Long QT syndrome: Secondary | ICD-10-CM | POA: Diagnosis not present

## 2013-02-22 DIAGNOSIS — I2510 Atherosclerotic heart disease of native coronary artery without angina pectoris: Secondary | ICD-10-CM | POA: Diagnosis not present

## 2013-02-22 DIAGNOSIS — J90 Pleural effusion, not elsewhere classified: Secondary | ICD-10-CM | POA: Diagnosis not present

## 2013-02-22 DIAGNOSIS — I059 Rheumatic mitral valve disease, unspecified: Secondary | ICD-10-CM | POA: Diagnosis not present

## 2013-02-22 DIAGNOSIS — I4891 Unspecified atrial fibrillation: Secondary | ICD-10-CM | POA: Diagnosis not present

## 2013-02-22 DIAGNOSIS — R9431 Abnormal electrocardiogram [ECG] [EKG]: Secondary | ICD-10-CM | POA: Diagnosis not present

## 2013-02-22 DIAGNOSIS — K219 Gastro-esophageal reflux disease without esophagitis: Secondary | ICD-10-CM | POA: Diagnosis not present

## 2013-02-22 NOTE — Telephone Encounter (Signed)
Call from [facility] that the patient is still in a-fib with a rate around 160 bpm at 9:01 am. I advised that Dr. Kirke Corin has already spoken with the patient's daughter this morning and has advised them to go to [facility] ER for evaluation and possible PPM implant. [facility] states they will not notify us of further events today.

## 2013-02-22 NOTE — Telephone Encounter (Signed)
Received call from Tany with [facility] that pt had episode of atrial fib 160's then conversion to SB HR 48 with 4.4sec pause. Called pt twice with no answer. LMOM to call us back. See previous phone notes - our office has been in communication with pt and her daughter regarding need for pacemaker/EP eval based on similar monitor findings yesterday and pt's daughter asked to take pt to [facility]. Will update EP team as well and continue to attempt to contact pt. Daune Divirgilio PA-C

## 2013-02-22 NOTE — Telephone Encounter (Signed)
Monitoring showed atrial fib 160's then conversion to SB HR 48 with 4.4sec pause. I called the patient's daughter and instructed to take the patient to the emergency room for admission and pacemaker placement. They live closer to [facility] and want to take her there. We will fax our records and monitoring strips.

## 2013-02-22 NOTE — Telephone Encounter (Signed)
Was able to reach pt's daughter on 3rd try. Her mom was sleeping during time of the pause/AF. She woke her mom up and her mom felt fine, just a little tired. No syncope or other current symptoms. It was not clear to me that the daughter had concrete plans of what to do next. She initially mentioned going to [facility], then said they were going to just show up at the office today. I asked her what office and she told me our office but said she didn't have an appointment. I spoke with Dr. Kirke Corin - our office tried to get her to come into clinic yesterday for eval but they declined, instead requesting an appointment further out in timeframe. We are not sure she understood the importance of the monitor findings. At this point we both agree she needs further intervention and would recommend hospitalization. Dr. Kirke Corin said the [facility] office staff would take it from here in contacting the patient with further instructions. I told the patient's daughter that our office would be contacting her, and in the meantime if the patient develops any dizziness, syncope, presyncope, CP, SOB or other concerning symptoms, to call 911. She verbalized understanding. Kess Mcilwain PA-C

## 2013-02-26 DIAGNOSIS — R5381 Other malaise: Secondary | ICD-10-CM | POA: Insufficient documentation

## 2013-02-28 ENCOUNTER — Ambulatory Visit (INDEPENDENT_AMBULATORY_CARE_PROVIDER_SITE_OTHER): Payer: Medicare Other

## 2013-02-28 DIAGNOSIS — I4891 Unspecified atrial fibrillation: Secondary | ICD-10-CM | POA: Diagnosis not present

## 2013-02-28 LAB — POCT INR: INR: 5.2

## 2013-03-01 DIAGNOSIS — E119 Type 2 diabetes mellitus without complications: Secondary | ICD-10-CM | POA: Diagnosis not present

## 2013-03-01 DIAGNOSIS — I2510 Atherosclerotic heart disease of native coronary artery without angina pectoris: Secondary | ICD-10-CM | POA: Diagnosis not present

## 2013-03-01 DIAGNOSIS — I10 Essential (primary) hypertension: Secondary | ICD-10-CM | POA: Diagnosis not present

## 2013-03-01 DIAGNOSIS — Z7901 Long term (current) use of anticoagulants: Secondary | ICD-10-CM | POA: Diagnosis not present

## 2013-03-01 DIAGNOSIS — I5020 Unspecified systolic (congestive) heart failure: Secondary | ICD-10-CM | POA: Diagnosis not present

## 2013-03-01 DIAGNOSIS — Z9181 History of falling: Secondary | ICD-10-CM | POA: Diagnosis not present

## 2013-03-01 DIAGNOSIS — Z48812 Encounter for surgical aftercare following surgery on the circulatory system: Secondary | ICD-10-CM | POA: Diagnosis not present

## 2013-03-01 DIAGNOSIS — K449 Diaphragmatic hernia without obstruction or gangrene: Secondary | ICD-10-CM | POA: Diagnosis not present

## 2013-03-01 DIAGNOSIS — Z950 Presence of cardiac pacemaker: Secondary | ICD-10-CM | POA: Diagnosis not present

## 2013-03-01 DIAGNOSIS — IMO0002 Reserved for concepts with insufficient information to code with codable children: Secondary | ICD-10-CM | POA: Diagnosis not present

## 2013-03-01 DIAGNOSIS — I495 Sick sinus syndrome: Secondary | ICD-10-CM | POA: Diagnosis not present

## 2013-03-01 DIAGNOSIS — I4430 Unspecified atrioventricular block: Secondary | ICD-10-CM | POA: Diagnosis not present

## 2013-03-01 DIAGNOSIS — I509 Heart failure, unspecified: Secondary | ICD-10-CM | POA: Diagnosis not present

## 2013-03-02 ENCOUNTER — Ambulatory Visit (INDEPENDENT_AMBULATORY_CARE_PROVIDER_SITE_OTHER): Payer: Medicare Other | Admitting: Cardiovascular Disease

## 2013-03-02 ENCOUNTER — Encounter: Payer: Self-pay | Admitting: Internal Medicine

## 2013-03-02 ENCOUNTER — Encounter: Payer: Self-pay | Admitting: Cardiovascular Disease

## 2013-03-02 ENCOUNTER — Ambulatory Visit (INDEPENDENT_AMBULATORY_CARE_PROVIDER_SITE_OTHER): Payer: Medicare Other | Admitting: *Deleted

## 2013-03-02 VITALS — BP 114/81 | HR 70 | Ht 60.0 in | Wt 147.2 lb

## 2013-03-02 DIAGNOSIS — I509 Heart failure, unspecified: Secondary | ICD-10-CM | POA: Diagnosis not present

## 2013-03-02 DIAGNOSIS — I4430 Unspecified atrioventricular block: Secondary | ICD-10-CM | POA: Diagnosis not present

## 2013-03-02 DIAGNOSIS — I5020 Unspecified systolic (congestive) heart failure: Secondary | ICD-10-CM | POA: Diagnosis not present

## 2013-03-02 DIAGNOSIS — I428 Other cardiomyopathies: Secondary | ICD-10-CM | POA: Diagnosis not present

## 2013-03-02 DIAGNOSIS — I4891 Unspecified atrial fibrillation: Secondary | ICD-10-CM

## 2013-03-02 DIAGNOSIS — I495 Sick sinus syndrome: Secondary | ICD-10-CM | POA: Insufficient documentation

## 2013-03-02 DIAGNOSIS — E119 Type 2 diabetes mellitus without complications: Secondary | ICD-10-CM | POA: Diagnosis not present

## 2013-03-02 DIAGNOSIS — Z48812 Encounter for surgical aftercare following surgery on the circulatory system: Secondary | ICD-10-CM | POA: Diagnosis not present

## 2013-03-02 LAB — PACEMAKER DEVICE OBSERVATION
AL IMPEDENCE PM: 475 Ohm
AL THRESHOLD: 0.5 V
ATRIAL PACING PM: 99.91
BAMS-0001: 170 {beats}/min
RV LEAD AMPLITUDE: 13.875 mv
RV LEAD IMPEDENCE PM: 646 Ohm
RV LEAD THRESHOLD: 0.75 V
VENTRICULAR PACING PM: 0.03

## 2013-03-02 NOTE — Progress Notes (Signed)
Wound check-PPM in office. 

## 2013-03-02 NOTE — Assessment & Plan Note (Signed)
She is maintaining in sinus rhythm with sotalol. QT interval appears to be normal. Continue long-term anticoagulation with warfarin. She was enrolled in our anticoagulation clinic. Recent INR was elevated with adjustments in the dose. If her INR is difficult to maintain, I will consider switching her to a novel agent.

## 2013-03-02 NOTE — Patient Instructions (Addendum)
Stop taking Aspirin.  Continue other medications.   Your physician wants you to follow-up in: 3 months.  You will receive a reminder letter in the mail two months in advance. If you don't receive a letter, please call our office to schedule the follow-up appointment.  Your physician recommends that you schedule a follow-up appointment in: 3 months with Dr Graciela Husbands for pacer check

## 2013-03-02 NOTE — Progress Notes (Signed)
Primary care physician: Dr. Thana Ates  HPI  This is a 67 year old female who is here today for a followup after a recent pacemaker placement. She had previous atrial myxoma resection and one-vessel CABG in 2012 done at [facility]. She has known nonischemic cardiomyopathy with previous ejection fraction of 45%. She has multiple chronic medical conditions including hypertension, hyperlipidemia and diabetes. She had recurrent chest pain post CABG. She underwent a nuclear stress test in March of 2014 by Dr. Juliann Pares, her previous cardiologist which showed no evidence of ischemia with normal ejection fraction. She reports chest pain when she is under stress.  She was seen recently for palpitations and dizziness. Echocardiogram showed mildly reduced LV systolic function with an EF of 45-50% which is stable with mild to moderate mitral regurgitation and moderate tricuspid regurgitation.  She underwent evaluation with an outpatient telemetry which showed frequent episodes of atrial fibrillation with rapid ventricular response up to 160 beats per minute followed by prolonged 3-6 seconds pauses with bradycardia. She was instructed to go to the emergency room and decided to go to [facility]. She underwent dual-chamber pacemaker placement without complications. She was started on sotalol for rhythm control. Warfarin was initiated after pacemaker placement. Since then, the patient reports resolution of palpitations and dizziness.   No Known Allergies   Current Outpatient Prescriptions on File Prior to Visit  Medication Sig Dispense Refill  . amLODipine (NORVASC) 10 MG tablet Take 10 mg by mouth daily.      Marland Kitchen atorvastatin (LIPITOR) 80 MG tablet Take 80 mg by mouth daily.      . chlorthalidone (HYGROTON) 25 MG tablet Take 25 mg by mouth daily.      Marland Kitchen levothyroxine (SYNTHROID, LEVOTHROID) 50 MCG tablet Take 50 mcg by mouth daily before breakfast.      . magnesium oxide (MAG-OX) 400 MG tablet Take 800 mg by mouth daily.       .  metFORMIN (GLUCOPHAGE) 500 MG tablet Take 500 mg by mouth daily with breakfast.      . ranitidine (ZANTAC) 300 MG tablet Take 300 mg by mouth at bedtime.      Marland Kitchen warfarin (COUMADIN) 2.5 MG tablet Take 2.5 mg by mouth as directed.       No current facility-administered medications on file prior to visit.     Past Medical History  Diagnosis Date  . Postsurgical percutaneous transluminal coronary angioplasty status   . Type II or unspecified type diabetes mellitus without mention of complication, not stated as uncontrolled   . Other and unspecified hyperlipidemia   . Osteoporosis, unspecified   . Lumbago   . Unspecified hypothyroidism   . Contusion of unspecified site   . Unspecified menopausal and postmenopausal disorder   . Cramp of limb   . Heartburn   . Contact dermatitis and other eczema, due to unspecified cause   . Other specified cardiac dysrhythmias(427.89)   . Benign neoplasm of heart   . Essential hypertension, benign   . Atrial myxoma     Status post resection in 2012 at [facility]  . Coronary atherosclerosis of unspecified type of vessel, native or graft     Status post CABG in March of 2012 at the time of atrial myxoma resection with LIMA to LAD done at [facility]  . Nonischemic cardiomyopathy     EF: 45% in 2012     Past Surgical History  Procedure Laterality Date  . Atrial myxoma excision Left   . Vaginal hysterectomy    . Coronary artery  bypass graft  2011    [facility]  . Cardiac catheterization  2012    [facility]; right and left heart 75% ostial LAD lesion  . Insert / replace / remove pacemaker  02/2013    Dual chamber MDT advisa pacemaker. [id number]     Family History  Problem Relation Age of Onset  . Heart failure Mother      History   Social History  . Marital Status: Married    Spouse Name: N/A    Number of Children: N/A  . Years of Education: N/A   Occupational History  . Not on file.   Social History Main Topics  . Smoking status: Never Smoker   . Smokeless  tobacco: Not on file  . Alcohol Use: No  . Drug Use: No  . Sexual Activity: Not on file   Other Topics Concern  . Not on file   Social History Narrative  . No narrative on file       PHYSICAL EXAM   BP 114/81  Pulse 70  Ht 5' (1.524 m)  Wt 147 lb 4 oz (66.792 kg)  BMI 28.76 kg/m2 Constitutional: She is oriented to person, place, and time. She appears well-developed and well-nourished. No distress.  HENT: No nasal discharge.  Head: Normocephalic and atraumatic.  Eyes: Pupils are equal and round. Right eye exhibits no discharge. Left eye exhibits no discharge.  Neck: Normal range of motion. Neck supple. No JVD present. No thyromegaly present.  Cardiovascular: Normal rate, regular rhythm, normal heart sounds. Exam reveals no gallop and no friction rub. No murmur heard.  Pulmonary/Chest: Effort normal and breath sounds normal. No stridor. No respiratory distress. She has no wheezes. She has no rales. She exhibits no tenderness.  Abdominal: Soft. Bowel sounds are normal. She exhibits no distension. There is no tenderness. There is no rebound and no guarding.  Musculoskeletal: Normal range of motion. She exhibits no edema and no tenderness.  Neurological: She is alert and oriented to person, place, and time. Coordination normal.  Skin: Skin is warm and dry. No rash noted. She is not diaphoretic. No erythema. No pallor.  Psychiatric: She has a normal mood and affect. Her behavior is normal. Judgment and thought content normal.     EKG: Electronic atrial pacemaker  Low voltage in limb leads.   -  Negative T-waves  - Anterior ischemia. Normal QT interval  ABNORMAL     ASSESSMENT AND PLAN

## 2013-03-02 NOTE — Assessment & Plan Note (Signed)
Recent ejection fraction was 45% which is stable. Continue blood pressure control. I might consider increasing the dose of lisinopril and decreasing other antihypertensive medications in the near future.

## 2013-03-02 NOTE — Assessment & Plan Note (Signed)
She underwent recent dual-chamber pacemaker placement at [facility] without complications. The device was interrogated today with wound check. She is to continue followup at the device clinic.

## 2013-03-06 DIAGNOSIS — I495 Sick sinus syndrome: Secondary | ICD-10-CM | POA: Diagnosis not present

## 2013-03-06 DIAGNOSIS — E119 Type 2 diabetes mellitus without complications: Secondary | ICD-10-CM | POA: Diagnosis not present

## 2013-03-06 DIAGNOSIS — Z950 Presence of cardiac pacemaker: Secondary | ICD-10-CM | POA: Diagnosis not present

## 2013-03-06 DIAGNOSIS — I4430 Unspecified atrioventricular block: Secondary | ICD-10-CM | POA: Diagnosis not present

## 2013-03-06 DIAGNOSIS — I509 Heart failure, unspecified: Secondary | ICD-10-CM | POA: Diagnosis not present

## 2013-03-06 DIAGNOSIS — Z48812 Encounter for surgical aftercare following surgery on the circulatory system: Secondary | ICD-10-CM | POA: Diagnosis not present

## 2013-03-06 DIAGNOSIS — I5020 Unspecified systolic (congestive) heart failure: Secondary | ICD-10-CM | POA: Diagnosis not present

## 2013-03-06 DIAGNOSIS — Z9861 Coronary angioplasty status: Secondary | ICD-10-CM | POA: Diagnosis not present

## 2013-03-06 DIAGNOSIS — I10 Essential (primary) hypertension: Secondary | ICD-10-CM | POA: Diagnosis not present

## 2013-03-07 ENCOUNTER — Telehealth: Payer: Self-pay

## 2013-03-07 NOTE — Telephone Encounter (Signed)
Attempted to call pt after missed appt at [facility] in [place] today.  LMOM TCB.

## 2013-03-09 DIAGNOSIS — I4430 Unspecified atrioventricular block: Secondary | ICD-10-CM | POA: Diagnosis not present

## 2013-03-09 DIAGNOSIS — I5020 Unspecified systolic (congestive) heart failure: Secondary | ICD-10-CM | POA: Diagnosis not present

## 2013-03-09 DIAGNOSIS — Z48812 Encounter for surgical aftercare following surgery on the circulatory system: Secondary | ICD-10-CM | POA: Diagnosis not present

## 2013-03-09 DIAGNOSIS — I509 Heart failure, unspecified: Secondary | ICD-10-CM | POA: Diagnosis not present

## 2013-03-09 DIAGNOSIS — E119 Type 2 diabetes mellitus without complications: Secondary | ICD-10-CM | POA: Diagnosis not present

## 2013-03-09 DIAGNOSIS — I495 Sick sinus syndrome: Secondary | ICD-10-CM | POA: Diagnosis not present

## 2013-03-14 ENCOUNTER — Ambulatory Visit (INDEPENDENT_AMBULATORY_CARE_PROVIDER_SITE_OTHER): Payer: Medicare Other | Admitting: General Practice

## 2013-03-14 DIAGNOSIS — I4891 Unspecified atrial fibrillation: Secondary | ICD-10-CM | POA: Diagnosis not present

## 2013-03-14 NOTE — Telephone Encounter (Signed)
Pt is scheduled to follow-up in [facility] today 03/14/13.  Will discuss importance of compliance and risks associated with non-compliance at today's OV.

## 2013-03-15 ENCOUNTER — Telehealth: Payer: Self-pay

## 2013-03-15 ENCOUNTER — Encounter: Payer: Self-pay | Admitting: Cardiovascular Disease

## 2013-03-15 NOTE — Telephone Encounter (Signed)
Patient's daughter called to confirm that patient has been taking the warfarin 2.5 mg tablets. The patient was instructed to follow the instructions on coag visit from 03/14/2013.

## 2013-03-15 NOTE — Telephone Encounter (Signed)
Spoke with Bonita Quin pt's daughter, she states pt has been holding Coumadin since visit on 02/28/13.  Per Daughter's report, she advised pt not to take anymore Coumadin until INR recheck.  She states pt did resume Coumadin per our instructions yesterday.  Advised daughter to administer Coumadin per dosage instructions on AVS from anticoagulation visit 03/14/13.  Pt's daughter verbalized understanding.

## 2013-03-16 DIAGNOSIS — I4430 Unspecified atrioventricular block: Secondary | ICD-10-CM | POA: Diagnosis not present

## 2013-03-16 DIAGNOSIS — E119 Type 2 diabetes mellitus without complications: Secondary | ICD-10-CM | POA: Diagnosis not present

## 2013-03-16 DIAGNOSIS — I5020 Unspecified systolic (congestive) heart failure: Secondary | ICD-10-CM | POA: Diagnosis not present

## 2013-03-16 DIAGNOSIS — I495 Sick sinus syndrome: Secondary | ICD-10-CM | POA: Diagnosis not present

## 2013-03-16 DIAGNOSIS — I509 Heart failure, unspecified: Secondary | ICD-10-CM | POA: Diagnosis not present

## 2013-03-16 DIAGNOSIS — Z48812 Encounter for surgical aftercare following surgery on the circulatory system: Secondary | ICD-10-CM | POA: Diagnosis not present

## 2013-03-21 ENCOUNTER — Ambulatory Visit (INDEPENDENT_AMBULATORY_CARE_PROVIDER_SITE_OTHER): Payer: Medicare Other | Admitting: General Practice

## 2013-03-21 DIAGNOSIS — I4891 Unspecified atrial fibrillation: Secondary | ICD-10-CM | POA: Diagnosis not present

## 2013-03-28 ENCOUNTER — Ambulatory Visit (INDEPENDENT_AMBULATORY_CARE_PROVIDER_SITE_OTHER): Payer: Medicare Other | Admitting: General Practice

## 2013-03-28 DIAGNOSIS — R002 Palpitations: Secondary | ICD-10-CM

## 2013-03-28 DIAGNOSIS — I4891 Unspecified atrial fibrillation: Secondary | ICD-10-CM

## 2013-04-04 ENCOUNTER — Ambulatory Visit (INDEPENDENT_AMBULATORY_CARE_PROVIDER_SITE_OTHER): Payer: Medicare Other | Admitting: General Practice

## 2013-04-04 DIAGNOSIS — I4891 Unspecified atrial fibrillation: Secondary | ICD-10-CM

## 2013-04-04 LAB — POCT INR: INR: 3

## 2013-04-12 DIAGNOSIS — J40 Bronchitis, not specified as acute or chronic: Secondary | ICD-10-CM | POA: Diagnosis not present

## 2013-04-12 DIAGNOSIS — I499 Cardiac arrhythmia, unspecified: Secondary | ICD-10-CM | POA: Diagnosis not present

## 2013-04-16 ENCOUNTER — Ambulatory Visit (INDEPENDENT_AMBULATORY_CARE_PROVIDER_SITE_OTHER): Payer: Medicare Other | Admitting: General Practice

## 2013-04-16 ENCOUNTER — Ambulatory Visit (INDEPENDENT_AMBULATORY_CARE_PROVIDER_SITE_OTHER): Payer: Medicare Other | Admitting: Cardiovascular Disease

## 2013-04-16 ENCOUNTER — Encounter: Payer: Self-pay | Admitting: Cardiovascular Disease

## 2013-04-16 VITALS — BP 118/86 | HR 71 | Ht 59.0 in | Wt 144.5 lb

## 2013-04-16 DIAGNOSIS — I2510 Atherosclerotic heart disease of native coronary artery without angina pectoris: Secondary | ICD-10-CM

## 2013-04-16 DIAGNOSIS — I10 Essential (primary) hypertension: Secondary | ICD-10-CM | POA: Diagnosis not present

## 2013-04-16 DIAGNOSIS — I4891 Unspecified atrial fibrillation: Secondary | ICD-10-CM

## 2013-04-16 DIAGNOSIS — I495 Sick sinus syndrome: Secondary | ICD-10-CM

## 2013-04-16 NOTE — Progress Notes (Signed)
Primary care physician: Dr. Thana Ates  HPI  This is a 67 year old female who is here today for a followup visit. She had previous atrial myxoma resection and one-vessel CABG in 2012 done at [facility]. She has known nonischemic cardiomyopathy with previous ejection fraction of 45%. She has multiple chronic medical conditions including hypertension, hyperlipidemia and diabetes. She had recurrent chest pain post CABG. Most recent nuclear stress test in March of 2014 showed no evidence of ischemia with normal ejection fraction. She reports chest pain when she is under stress.  She was seen in August for palpitations and dizziness. Echocardiogram showed mildly reduced LV systolic function with an EF of 45-50% which is stable with mild to moderate mitral regurgitation and moderate tricuspid regurgitation.  She underwent evaluation with an outpatient telemetry which showed frequent episodes of atrial fibrillation with rapid ventricular response up to 160 beats per minute followed by prolonged 3-6 seconds pauses with bradycardia.  She underwent dual-chamber pacemaker placement at [facility] without complications. She was started on sotalol for rhythm control. Warfarin was initiated after pacemaker placement. Since then, the patient reports resolution of palpitations and dizziness. She had a cold and bronchitis recently and was started on Levaquin.    No Known Allergies   Current Outpatient Prescriptions on File Prior to Visit  Medication Sig Dispense Refill  . amLODipine (NORVASC) 10 MG tablet Take 10 mg by mouth daily.      Marland Kitchen atorvastatin (LIPITOR) 80 MG tablet Take 80 mg by mouth daily.      . Calcium Carbonate-Vitamin D (CALCIUM-VITAMIN D) 500-200 MG-UNIT per tablet Take 1 tablet by mouth 2 (two) times daily with a meal.      . chlorthalidone (HYGROTON) 25 MG tablet Take 25 mg by mouth daily.      . ergocalciferol (VITAMIN D2) 50000 UNITS capsule Take 50,000 Units by mouth once a week.      . furosemide (LASIX)  40 MG tablet Take 40 mg by mouth daily.      Marland Kitchen levothyroxine (SYNTHROID, LEVOTHROID) 50 MCG tablet Take 50 mcg by mouth daily before breakfast.      . lisinopril (PRINIVIL,ZESTRIL) 5 MG tablet Take 5 mg by mouth daily.      . magnesium oxide (MAG-OX) 400 MG tablet Take 800 mg by mouth daily.       . metFORMIN (GLUCOPHAGE) 500 MG tablet Take 500 mg by mouth daily with breakfast.      . ranitidine (ZANTAC) 300 MG tablet Take 300 mg by mouth at bedtime.      . sotalol (BETAPACE) 80 MG tablet Take 80 mg by mouth every 12 (twelve) hours.      Marland Kitchen warfarin (COUMADIN) 2.5 MG tablet Take 2.5 mg by mouth as directed.       No current facility-administered medications on file prior to visit.     Past Medical History  Diagnosis Date  . Postsurgical percutaneous transluminal coronary angioplasty status   . Type II or unspecified type diabetes mellitus without mention of complication, not stated as uncontrolled   . Other and unspecified hyperlipidemia   . Osteoporosis, unspecified   . Lumbago   . Unspecified hypothyroidism   . Contusion of unspecified site   . Unspecified menopausal and postmenopausal disorder   . Cramp of limb   . Heartburn   . Contact dermatitis and other eczema, due to unspecified cause   . Other specified cardiac dysrhythmias(427.89)   . Benign neoplasm of heart   . Essential hypertension, benign   .  Atrial myxoma     Status post resection in 2012 at [facility]  . Coronary atherosclerosis of unspecified type of vessel, native or graft     Status post CABG in March of 2012 at the time of atrial myxoma resection with LIMA to LAD done at [facility]  . Nonischemic cardiomyopathy     EF: 45% in 2012  . Nonischemic cardiomyopathy      Past Surgical History  Procedure Laterality Date  . Atrial myxoma excision Left   . Vaginal hysterectomy    . Coronary artery bypass graft  2011    [facility]  . Cardiac catheterization  2012    [facility]; right and left heart 75% ostial LAD lesion  . Insert /  replace / remove pacemaker  02/2013    Dual chamber MDT advisa pacemaker. [id number]     Family History  Problem Relation Age of Onset  . Heart failure Mother      History   Social History  . Marital Status: Married    Spouse Name: N/A    Number of Children: N/A  . Years of Education: N/A   Occupational History  . Not on file.   Social History Main Topics  . Smoking status: Never Smoker   . Smokeless tobacco: Not on file  . Alcohol Use: No  . Drug Use: No  . Sexual Activity: Not on file   Other Topics Concern  . Not on file   Social History Narrative  . No narrative on file       PHYSICAL EXAM   BP 118/86  Pulse 71  Ht 4\' 11"  (1.499 m)  Wt 144 lb 8 oz (65.545 kg)  BMI 29.17 kg/m2 Constitutional: She is oriented to person, place, and time. She appears well-developed and well-nourished. No distress.  HENT: No nasal discharge.  Head: Normocephalic and atraumatic.  Eyes: Pupils are equal and round. Right eye exhibits no discharge. Left eye exhibits no discharge.  Neck: Normal range of motion. Neck supple. No JVD present. No thyromegaly present.  Cardiovascular: Normal rate, regular rhythm, normal heart sounds. Exam reveals no gallop and no friction rub. No murmur heard.  Pulmonary/Chest: Effort normal and breath sounds normal. No stridor. No respiratory distress. She has no wheezes. She has no rales. She exhibits no tenderness.  Abdominal: Soft. Bowel sounds are normal. She exhibits no distension. There is no tenderness. There is no rebound and no guarding.  Musculoskeletal: Normal range of motion. She exhibits no edema and no tenderness.  Neurological: She is alert and oriented to person, place, and time. Coordination normal.  Skin: Skin is warm and dry. No rash noted. She is not diaphoretic. No erythema. No pallor.  Psychiatric: She has a normal mood and affect. Her behavior is normal. Judgment and thought content normal.     EKG: Electronic atrial pacemaker    Low voltage in limb leads.   -  Non specific STTW changes.     ASSESSMENT AND PLAN

## 2013-04-16 NOTE — Patient Instructions (Signed)
Continue same medications.   Your physician wants you to follow-up in: 4 months.  You will receive a reminder letter in the mail two months in advance. If you don't receive a letter, please call our office to schedule the follow-up appointment.  

## 2013-04-17 NOTE — Assessment & Plan Note (Signed)
She has no symptoms of angina. Continue medical therapy. 

## 2013-04-17 NOTE — Assessment & Plan Note (Signed)
Status post dual-chamber pacemaker placement. She has a followup appointment with Dr. Graciela Husbands in December.

## 2013-04-17 NOTE — Assessment & Plan Note (Signed)
Blood pressure is well controlled on current medications. She uses Lasix only as needed and on the average of once a week.

## 2013-04-17 NOTE — Assessment & Plan Note (Signed)
She is maintaining in NSR with Sotalol which will be continued. Continue anticoagulation with warfarin. INR is therapeutic today. We had some difficulty maintaining therapeutic INR initially but this has improved. We can consider switching to a NOAC in the future if needed.

## 2013-04-18 ENCOUNTER — Ambulatory Visit (INDEPENDENT_AMBULATORY_CARE_PROVIDER_SITE_OTHER): Payer: Medicare Other | Admitting: *Deleted

## 2013-04-18 DIAGNOSIS — Z48812 Encounter for surgical aftercare following surgery on the circulatory system: Secondary | ICD-10-CM | POA: Diagnosis not present

## 2013-04-18 DIAGNOSIS — I4891 Unspecified atrial fibrillation: Secondary | ICD-10-CM

## 2013-04-18 LAB — POCT INR: INR: 2.5

## 2013-04-25 ENCOUNTER — Ambulatory Visit (INDEPENDENT_AMBULATORY_CARE_PROVIDER_SITE_OTHER): Payer: Medicare Other | Admitting: General Practice

## 2013-04-25 DIAGNOSIS — I4891 Unspecified atrial fibrillation: Secondary | ICD-10-CM

## 2013-04-27 ENCOUNTER — Other Ambulatory Visit: Payer: Self-pay

## 2013-04-27 MED ORDER — SOTALOL HCL 80 MG PO TABS: 80.0000 mg | ORAL_TABLET | Freq: Two times a day (BID) | ORAL | Status: DC

## 2013-04-27 NOTE — Telephone Encounter (Signed)
Requested Prescriptions   Signed Prescriptions Disp Refills  . sotalol (BETAPACE) 80 MG tablet 60 tablet 3    Sig: Take 1 tablet (80 mg total) by mouth every 12 (twelve) hours.    Authorizing Provider: Lorine Bears A    Ordering User: Kendrick Fries

## 2013-05-09 ENCOUNTER — Ambulatory Visit (INDEPENDENT_AMBULATORY_CARE_PROVIDER_SITE_OTHER): Payer: Medicare Other | Admitting: General Practice

## 2013-05-09 DIAGNOSIS — I4891 Unspecified atrial fibrillation: Secondary | ICD-10-CM | POA: Diagnosis not present

## 2013-05-24 ENCOUNTER — Ambulatory Visit (INDEPENDENT_AMBULATORY_CARE_PROVIDER_SITE_OTHER): Payer: Medicare Other | Admitting: Internal Medicine

## 2013-05-24 ENCOUNTER — Other Ambulatory Visit: Payer: Self-pay | Admitting: Internal Medicine

## 2013-05-24 ENCOUNTER — Encounter: Payer: Self-pay | Admitting: Internal Medicine

## 2013-05-24 ENCOUNTER — Ambulatory Visit (INDEPENDENT_AMBULATORY_CARE_PROVIDER_SITE_OTHER): Payer: Medicare Other | Admitting: Pharmacist

## 2013-05-24 VITALS — BP 100/72 | HR 72 | Ht 59.0 in | Wt 140.5 lb

## 2013-05-24 DIAGNOSIS — I495 Sick sinus syndrome: Secondary | ICD-10-CM | POA: Diagnosis not present

## 2013-05-24 DIAGNOSIS — I4891 Unspecified atrial fibrillation: Secondary | ICD-10-CM

## 2013-05-24 DIAGNOSIS — Z45018 Encounter for adjustment and management of other part of cardiac pacemaker: Secondary | ICD-10-CM

## 2013-05-24 DIAGNOSIS — I428 Other cardiomyopathies: Secondary | ICD-10-CM | POA: Diagnosis not present

## 2013-05-24 NOTE — Progress Notes (Signed)
Patient Care Team: Dennison Mascot, MD as PCP - General (Unknown Physician Specialty)   HPI  Felicia Poole is a 67 y.o. female Seen for pacemaker followup for atrial fibrillation and significant post termination pauses. She then given an event recorder by Dr. Darin Engels demonstrate pauses were evident she and her daughter chose to go to [facility] where pacemaker insertion was accomplished  She has a history of a prior atrial myxoma status post resection with one-vessel CABG at [facility] in 2012. She is a history of a nonischemic cardiomyopathy with ejection fraction 45% and a nuclear test March 2014 by Dr. Juliann Pares that apparently showed no ischemia and normal left ventricular function.    Past Medical History  Diagnosis Date  . Postsurgical percutaneous transluminal coronary angioplasty status   . Type II or unspecified type diabetes mellitus without mention of complication, not stated as uncontrolled   . Other and unspecified hyperlipidemia   . Osteoporosis, unspecified   . Lumbago   . Unspecified hypothyroidism   . Contusion of unspecified site   . Unspecified menopausal and postmenopausal disorder   . Cramp of limb   . Heartburn   . Contact dermatitis and other eczema, due to unspecified cause   . Other specified cardiac dysrhythmias(427.89)   . Benign neoplasm of heart   . Essential hypertension, benign   . Atrial myxoma     Status post resection in 2012 at [facility]  . Coronary atherosclerosis of unspecified type of vessel, native or graft     Status post CABG in March of 2012 at the time of atrial myxoma resection with LIMA to LAD done at [facility]  . Nonischemic cardiomyopathy     EF: 45% in 2012  . Nonischemic cardiomyopathy     Past Surgical History  Procedure Laterality Date  . Atrial myxoma excision Left   . Vaginal hysterectomy    . Coronary artery bypass graft  2011    [facility]  . Cardiac catheterization  2012    [facility]; right and left heart 75% ostial LAD lesion  . Insert /  replace / remove pacemaker  02/2013    Dual chamber MDT advisa pacemaker. [id number]    Current Outpatient Prescriptions  Medication Sig Dispense Refill  . alendronate (FOSAMAX) 70 MG tablet Take 70 mg by mouth once a week.       Marland Kitchen amLODipine (NORVASC) 10 MG tablet Take 10 mg by mouth daily.      Marland Kitchen atorvastatin (LIPITOR) 80 MG tablet Take 80 mg by mouth daily.      . Calcium Carbonate-Vitamin D (CALCIUM-VITAMIN D) 500-200 MG-UNIT per tablet Take 1 tablet by mouth 2 (two) times daily with a meal.      . chlorthalidone (HYGROTON) 25 MG tablet Take 25 mg by mouth daily.      . ergocalciferol (VITAMIN D2) 50000 UNITS capsule Take 50,000 Units by mouth once a week.      . furosemide (LASIX) 40 MG tablet Take 40 mg by mouth daily.      Marland Kitchen levothyroxine (SYNTHROID, LEVOTHROID) 50 MCG tablet Take 50 mcg by mouth daily before breakfast.      . lisinopril (PRINIVIL,ZESTRIL) 5 MG tablet Take 5 mg by mouth daily.      . magnesium oxide (MAG-OX) 400 MG tablet Take 800 mg by mouth daily.       . metFORMIN (GLUCOPHAGE) 500 MG tablet Take 500 mg by mouth daily with breakfast.      . ranitidine (ZANTAC) 300  MG tablet Take 300 mg by mouth at bedtime.      . sotalol (BETAPACE) 80 MG tablet Take 1 tablet (80 mg total) by mouth every 12 (twelve) hours.  60 tablet  3  . warfarin (COUMADIN) 2.5 MG tablet Take 2.5 mg by mouth as directed.       No current facility-administered medications for this visit.    No Known Allergies  Review of Systems negative except from HPI and PMH  Physical Exam BP 100/72  Pulse 72  Ht 4\' 11"  (1.499 m)  Wt 140 lb 8 oz (63.73 kg)  BMI 28.36 kg/m2 Well developed and well nourished in no acute distress HENT normal E scleral and icterus clear Neck Supple JVP flat; carotids brisk and full Clear to ausculation Device pocket well healed; without hematoma or erythema.  There is no tethering Regular rate and rhythm, no murmurs gallops or rub Soft with active bowel sounds No  clubbing cyanosis none Edema Alert and oriented, grossly normal motor and sensory function Skin Warm and Dry   ECG demonstrates atrial pacing with a PR interval of 240 ms/08/42 Assessment and  Plan

## 2013-05-24 NOTE — Assessment & Plan Note (Signed)
Her ejection fraction was 45%. She has been treated with ARB therapy  The event that we can add a beta blocker that may help LV function over the long-term. Currently her blood pressures to low so we will stop her amlodipine. I will defer this decision to Dr. Marcheta Grammes when he sees her in a few months

## 2013-05-24 NOTE — Patient Instructions (Addendum)
Your physician wants you to follow-up in: 9 months with Dr. Graciela Husbands.  You will receive a reminder letter in the mail two months in advance. If you don't receive a letter, please call our office to schedule the follow-up appointment.  Your physician wants you to follow-up in: 5 months with Dr. Kirke Corin.  You will receive a reminder letter in the mail two months in advance. If you don't receive a letter, please call our office to schedule the follow-up appointment.  Labs today: BMP, Mg, and INR  Your physician has recommended you make the following change in your medication: STOP AMLODIPINE  Please call us to clarify if you are taking furosemide or chlorthalidone.

## 2013-05-24 NOTE — Assessment & Plan Note (Signed)
She is on sotalol. We will check ametabolic profile and magnesium. She has not had significant atrial fibrillation as detected by her device. She is atrial pacing a great deal of the time. She has no heart rate excursion but has no limitations on activity  QT interval is okay. The dosing of her sotalol will depend upon her renal function which was normal 7/14

## 2013-05-24 NOTE — Assessment & Plan Note (Signed)
The patient's device was interrogated and the information was fully reviewed.  The device was reprogrammed to  Maximize longevity 

## 2013-05-25 ENCOUNTER — Other Ambulatory Visit: Payer: Self-pay | Admitting: *Deleted

## 2013-05-25 DIAGNOSIS — E875 Hyperkalemia: Secondary | ICD-10-CM

## 2013-05-25 LAB — BASIC METABOLIC PANEL
BUN/Creatinine Ratio: 22 (ref 11–26)
GFR calc Af Amer: 61 mL/min/{1.73_m2} (ref >59)
GFR calc non Af Amer: 53 mL/min/{1.73_m2} — ABNORMAL LOW (ref >59)
Glucose: 83 mg/dL (ref 65–99)
Potassium: 5.3 mmol/L — ABNORMAL HIGH (ref 3.5–5.2)
Sodium: 140 mmol/L (ref 134–144)

## 2013-05-25 LAB — MAGNESIUM: Magnesium: 2 mg/dL (ref 1.6–2.6)

## 2013-05-28 ENCOUNTER — Ambulatory Visit: Payer: Medicare Other | Admitting: Cardiovascular Disease

## 2013-06-04 DIAGNOSIS — E039 Hypothyroidism, unspecified: Secondary | ICD-10-CM | POA: Diagnosis not present

## 2013-06-04 DIAGNOSIS — E119 Type 2 diabetes mellitus without complications: Secondary | ICD-10-CM | POA: Diagnosis not present

## 2013-06-04 DIAGNOSIS — E785 Hyperlipidemia, unspecified: Secondary | ICD-10-CM | POA: Diagnosis not present

## 2013-06-04 DIAGNOSIS — I10 Essential (primary) hypertension: Secondary | ICD-10-CM | POA: Diagnosis not present

## 2013-06-11 ENCOUNTER — Encounter: Payer: Self-pay | Admitting: Cardiovascular Disease

## 2013-06-11 ENCOUNTER — Ambulatory Visit (INDEPENDENT_AMBULATORY_CARE_PROVIDER_SITE_OTHER): Payer: Medicare Other | Admitting: Cardiovascular Disease

## 2013-06-11 VITALS — BP 137/88 | HR 72 | Ht 59.0 in | Wt 142.5 lb

## 2013-06-11 DIAGNOSIS — I2510 Atherosclerotic heart disease of native coronary artery without angina pectoris: Secondary | ICD-10-CM

## 2013-06-11 DIAGNOSIS — I428 Other cardiomyopathies: Secondary | ICD-10-CM

## 2013-06-11 DIAGNOSIS — I4891 Unspecified atrial fibrillation: Secondary | ICD-10-CM

## 2013-06-11 DIAGNOSIS — D151 Benign neoplasm of heart: Secondary | ICD-10-CM

## 2013-06-11 NOTE — Patient Instructions (Signed)
Your physician recommends that you schedule a follow-up appointment in: 4 months.  Your physician recommends that you continue on your current medications as directed. Please refer to the Current Medication list given to you today.  

## 2013-06-11 NOTE — Assessment & Plan Note (Signed)
She has no symptoms of angina. Continue medical therapy. 

## 2013-06-11 NOTE — Assessment & Plan Note (Signed)
Most recent ejection fraction was 45-50%. There was a question from Dr. Caryl Comes whether she should be on a beta blocker due to that. Given that she is on sotalol which has a beta blocker activity, I prefer to increase the dose of lisinopril instead. She had recent borderline hypokalemia. She had blood work done with Dr. Rutherford Nail. I will request these labs. Most likely I will increase the dose of lisinopril. She uses Lasix only as needed. An average of once a week.

## 2013-06-11 NOTE — Assessment & Plan Note (Signed)
Status post resection. An echocardiogram every 2-3 years is recommended to evaluate for recurrence.

## 2013-06-11 NOTE — Assessment & Plan Note (Signed)
She is maintaining a normal sinus rhythm with sotalol. Recent renal function was normal. QT is mildly prolonged. Continue to monitor. Avoid medications that can cause QT prolongation especially certain antibiotics. Continue long-term anticoagulation with warfarin.

## 2013-06-11 NOTE — Progress Notes (Signed)
Primary care physician: Dr. Rutherford Nail  HPI  This is a 68 year old female who is here today for a followup visit. She had previous atrial myxoma resection and one-vessel CABG in 2012 done at [facility]. She has known nonischemic cardiomyopathy with previous ejection fraction of 45-50%. She has multiple chronic medical conditions including hypertension, hyperlipidemia and diabetes. She had recurrent chest pain post CABG. Most recent nuclear stress test in March of 2014 showed no evidence of ischemia with normal ejection fraction.  She was seen in August for palpitations and dizziness. Echocardiogram showed mildly reduced LV systolic function with an EF of 45-50% which is stable with mild to moderate mitral regurgitation and moderate tricuspid regurgitation.  She underwent evaluation with an outpatient telemetry which showed frequent episodes of atrial fibrillation with rapid ventricular response up to 160 beats per minute followed by prolonged 3-6 seconds pauses with bradycardia.  She underwent dual-chamber pacemaker placement at [facility] without complications. She was started on sotalol for rhythm control. She is on warfarin for anticoagulation. She has been doing very well and denies any chest pain, dizziness, worsening dyspnea or palpitations. She was taken off amlodipine by Dr. Caryl Comes during last visit.   No Known Allergies   Current Outpatient Prescriptions on File Prior to Visit  Medication Sig Dispense Refill  . atorvastatin (LIPITOR) 80 MG tablet Take 80 mg by mouth daily.      . chlorthalidone (HYGROTON) 25 MG tablet Take 25 mg by mouth daily.      . furosemide (LASIX) 40 MG tablet Take 40 mg by mouth daily.      Marland Kitchen levothyroxine (SYNTHROID, LEVOTHROID) 50 MCG tablet Take 50 mcg by mouth daily before breakfast.      . lisinopril (PRINIVIL,ZESTRIL) 5 MG tablet Take 5 mg by mouth daily.      . ranitidine (ZANTAC) 300 MG tablet Take 300 mg by mouth at bedtime.      . sotalol (BETAPACE) 80 MG tablet Take  1 tablet (80 mg total) by mouth every 12 (twelve) hours.  60 tablet  3  . warfarin (COUMADIN) 2.5 MG tablet Take 2.5 mg by mouth as directed.       No current facility-administered medications on file prior to visit.     Past Medical History  Diagnosis Date  . Postsurgical percutaneous transluminal coronary angioplasty status   . Type II or unspecified type diabetes mellitus without mention of complication, not stated as uncontrolled   . Other and unspecified hyperlipidemia   . Osteoporosis, unspecified   . Lumbago   . Unspecified hypothyroidism   . Contusion of unspecified site   . Unspecified menopausal and postmenopausal disorder   . Cramp of limb   . Heartburn   . Contact dermatitis and other eczema, due to unspecified cause   . Other specified cardiac dysrhythmias(427.89)   . Benign neoplasm of heart   . Essential hypertension, benign   . Atrial myxoma     Status post resection in 2012 at [facility]  . Coronary atherosclerosis of unspecified type of vessel, native or graft     Status post CABG in March of 2012 at the time of atrial myxoma resection with LIMA to LAD done at [facility]  . Nonischemic cardiomyopathy     EF: 45% in 2012  . Nonischemic cardiomyopathy      Past Surgical History  Procedure Laterality Date  . Atrial myxoma excision Left   . Vaginal hysterectomy    . Coronary artery bypass graft  2011  [facility]  . Cardiac catheterization  2012    [facility]; right and left heart 75% ostial LAD lesion  . Insert / replace / remove pacemaker  02/2013    Dual chamber MDT advisa pacemaker. [id number]     Family History  Problem Relation Age of Onset  . Heart failure Mother      History   Social History  . Marital Status: Married    Spouse Name: N/A    Number of Children: N/A  . Years of Education: N/A   Occupational History  . Not on file.   Social History Main Topics  . Smoking status: Never Smoker   . Smokeless tobacco: Not on file  . Alcohol Use: No  . Drug Use: No    . Sexual Activity: Not on file   Other Topics Concern  . Not on file   Social History Narrative  . No narrative on file       PHYSICAL EXAM   BP 137/88  Pulse 72  Ht 4\' 11"  (1.499 m)  Wt 142 lb 8 oz (64.638 kg)  BMI 28.77 kg/m2 Constitutional: She is oriented to person, place, and time. She appears well-developed and well-nourished. No distress.  HENT: No nasal discharge.  Head: Normocephalic and atraumatic.  Eyes: Pupils are equal and round. Right eye exhibits no discharge. Left eye exhibits no discharge.  Neck: Normal range of motion. Neck supple. No JVD present. No thyromegaly present.  Cardiovascular: Normal rate, regular rhythm, normal heart sounds. Exam reveals no gallop and no friction rub. No murmur heard.  Pulmonary/Chest: Effort normal and breath sounds normal. No stridor. No respiratory distress. She has no wheezes. She has no rales. She exhibits no tenderness.  Abdominal: Soft. Bowel sounds are normal. She exhibits no distension. There is no tenderness. There is no rebound and no guarding.  Musculoskeletal: Normal range of motion. She exhibits no edema and no tenderness.  Neurological: She is alert and oriented to person, place, and time. Coordination normal.  Skin: Skin is warm and dry. No rash noted. She is not diaphoretic. No erythema. No pallor.  Psychiatric: She has a normal mood and affect. Her behavior is normal. Judgment and thought content normal.     EKG: Electronic atrial pacemaker  Low voltage in limb leads.   -  Non specific STTW changes. Mildly prolonged QT interval    ASSESSMENT AND PLAN

## 2013-06-15 ENCOUNTER — Other Ambulatory Visit: Payer: Medicare Other

## 2013-06-18 ENCOUNTER — Other Ambulatory Visit: Payer: Self-pay | Admitting: *Deleted

## 2013-06-18 ENCOUNTER — Other Ambulatory Visit: Payer: Self-pay

## 2013-06-18 MED ORDER — WARFARIN SODIUM 2.5 MG PO TABS: 2.5000 mg | ORAL_TABLET | ORAL | Status: DC

## 2013-06-18 NOTE — Telephone Encounter (Signed)
Please review for refill.  

## 2013-06-18 NOTE — Telephone Encounter (Signed)
Please review and refill, Thank you.

## 2013-06-19 MED ORDER — LISINOPRIL 10 MG PO TABS: 10.0000 mg | ORAL_TABLET | Freq: Every day | ORAL | Status: DC

## 2013-06-19 NOTE — Telephone Encounter (Signed)
Left voicemail for patient to call office so I could inform her that per Dr. Fletcher Anon "Renal function looks ok. Increase Lisinopril to 10 mg once daily".

## 2013-06-19 NOTE — Telephone Encounter (Signed)
Left voicemail to inform patient that per Dr. Fletcher Anon "Renal function looks ok. Increase Lisinopril to 10 mg once daily". Asked patient to call us back.

## 2013-06-19 NOTE — Telephone Encounter (Signed)
Message copied by Tracie Harrier on Tue Jun 19, 2013  2:52 PM ------      Message from: Kathlyn Sacramento A      Created: Mon Jun 18, 2013  5:25 PM       Renal function looks ok. Increase Lisinopril to 10 mg once daily.             ----- Message -----         From: Tracie Harrier, RN         Sent: 06/18/2013  10:28 AM           To: Wellington Hampshire, MD            Just FYI the labs on this patient that you requested from Dr. Rutherford Nail are being scanned in right now.         ------

## 2013-06-19 NOTE — Telephone Encounter (Signed)
Message copied by Tracie Harrier on Tue Jun 19, 2013  2:57 PM ------      Message from: Kathlyn Sacramento A      Created: Mon Jun 18, 2013  5:25 PM       Renal function looks ok. Increase Lisinopril to 10 mg once daily.             ----- Message -----         From: Tracie Harrier, RN         Sent: 06/18/2013  10:28 AM           To: Wellington Hampshire, MD            Just FYI the labs on this patient that you requested from Dr. Rutherford Nail are being scanned in right now.         ------

## 2013-06-20 NOTE — Telephone Encounter (Signed)
Message copied by Tracie Harrier on Wed Jun 20, 2013  8:47 AM ------      Message from: Kathlyn Sacramento A      Created: Mon Jun 18, 2013  5:25 PM       Renal function looks ok. Increase Lisinopril to 10 mg once daily.             ----- Message -----         From: Tracie Harrier, RN         Sent: 06/18/2013  10:28 AM           To: Wellington Hampshire, MD            Just FYI the labs on this patient that you requested from Dr. Rutherford Nail are being scanned in right now.         ------

## 2013-06-20 NOTE — Telephone Encounter (Signed)
Informed daughter of patient that patient renal function is ok and she can increase her lisinopril to 10 mg daily. Daughter states she gives her mother her medications and will make the change as ordered. She will have patient call me be back to confirm change.

## 2013-06-26 ENCOUNTER — Telehealth: Payer: Self-pay | Admitting: *Deleted

## 2013-06-26 NOTE — Telephone Encounter (Signed)
Attempted to call patient everyday everyday since 06/18/13. Left voicemail each time to inform patient that per Dr. Fletcher Anon her renal function looks ok and she needs to increase her lisinopril to 10 mg once daily. Patient has not returned call. Dr. Jacklynn Ganong message was left on her voicemail several times.

## 2013-06-26 NOTE — Telephone Encounter (Signed)
Message copied by Tracie Harrier on Tue Jun 26, 2013 11:33 AM ------      Message from: Kathlyn Sacramento A      Created: Mon Jun 18, 2013  5:25 PM       Renal function looks ok. Increase Lisinopril to 10 mg once daily.             ----- Message -----         From: Tracie Harrier, RN         Sent: 06/18/2013  10:28 AM           To: Wellington Hampshire, MD            Just FYI the labs on this patient that you requested from Dr. Rutherford Nail are being scanned in right now.         ------

## 2013-06-27 ENCOUNTER — Ambulatory Visit (INDEPENDENT_AMBULATORY_CARE_PROVIDER_SITE_OTHER): Payer: Medicare Other

## 2013-06-27 DIAGNOSIS — I4891 Unspecified atrial fibrillation: Secondary | ICD-10-CM | POA: Diagnosis not present

## 2013-06-27 LAB — POCT INR: INR: 2.6

## 2013-06-27 NOTE — Telephone Encounter (Signed)
Informed patient during INR visit that per Dr. Fletcher Anon her renal function looks ok and she can increase lisinopril to 10 mg daily. Patient verbalized understanding.

## 2013-08-08 ENCOUNTER — Ambulatory Visit (INDEPENDENT_AMBULATORY_CARE_PROVIDER_SITE_OTHER): Payer: Medicare Other

## 2013-08-08 DIAGNOSIS — I4891 Unspecified atrial fibrillation: Secondary | ICD-10-CM

## 2013-08-08 DIAGNOSIS — Z5181 Encounter for therapeutic drug level monitoring: Secondary | ICD-10-CM | POA: Diagnosis not present

## 2013-08-08 LAB — POCT INR: INR: 1.6

## 2013-08-17 ENCOUNTER — Other Ambulatory Visit: Payer: Self-pay | Admitting: Cardiovascular Disease

## 2013-08-17 ENCOUNTER — Telehealth: Payer: Self-pay

## 2013-08-17 NOTE — Telephone Encounter (Signed)
Error

## 2013-08-29 ENCOUNTER — Ambulatory Visit (INDEPENDENT_AMBULATORY_CARE_PROVIDER_SITE_OTHER): Payer: Medicare Other

## 2013-08-29 DIAGNOSIS — Z5181 Encounter for therapeutic drug level monitoring: Secondary | ICD-10-CM | POA: Diagnosis not present

## 2013-08-29 DIAGNOSIS — I4891 Unspecified atrial fibrillation: Secondary | ICD-10-CM | POA: Diagnosis not present

## 2013-08-29 LAB — POCT INR: INR: 2.1

## 2013-09-26 ENCOUNTER — Ambulatory Visit (INDEPENDENT_AMBULATORY_CARE_PROVIDER_SITE_OTHER): Payer: Medicare Other

## 2013-09-26 DIAGNOSIS — I4891 Unspecified atrial fibrillation: Secondary | ICD-10-CM | POA: Diagnosis not present

## 2013-09-26 DIAGNOSIS — Z5181 Encounter for therapeutic drug level monitoring: Secondary | ICD-10-CM

## 2013-09-26 LAB — POCT INR: INR: 1.9

## 2013-10-05 ENCOUNTER — Other Ambulatory Visit: Payer: Self-pay | Admitting: Cardiovascular Disease

## 2013-10-08 NOTE — Telephone Encounter (Signed)
Please review for refill, Thank You. 

## 2013-10-12 ENCOUNTER — Encounter: Payer: Self-pay | Admitting: Cardiovascular Disease

## 2013-10-12 ENCOUNTER — Ambulatory Visit (INDEPENDENT_AMBULATORY_CARE_PROVIDER_SITE_OTHER): Payer: Medicare Other | Admitting: Cardiovascular Disease

## 2013-10-12 VITALS — BP 138/97 | HR 76 | Ht 59.0 in | Wt 132.2 lb

## 2013-10-12 DIAGNOSIS — I10 Essential (primary) hypertension: Secondary | ICD-10-CM

## 2013-10-12 DIAGNOSIS — I428 Other cardiomyopathies: Secondary | ICD-10-CM

## 2013-10-12 DIAGNOSIS — D151 Benign neoplasm of heart: Secondary | ICD-10-CM

## 2013-10-12 DIAGNOSIS — I2510 Atherosclerotic heart disease of native coronary artery without angina pectoris: Secondary | ICD-10-CM

## 2013-10-12 DIAGNOSIS — I4891 Unspecified atrial fibrillation: Secondary | ICD-10-CM | POA: Diagnosis not present

## 2013-10-12 MED ORDER — LISINOPRIL 20 MG PO TABS: 20.0000 mg | ORAL_TABLET | Freq: Every day | ORAL | Status: DC

## 2013-10-12 NOTE — Assessment & Plan Note (Signed)
He is maintaining in sinus rhythm on sotalol. Continue current dose. Continue long-term anticoagulation with warfarin. I will check basic metabolic profile in 6 months from now to ensure stable renal function.

## 2013-10-12 NOTE — Assessment & Plan Note (Signed)
Blood pressure is still elevated. Increase lisinopril to 20 mg once daily.

## 2013-10-12 NOTE — Assessment & Plan Note (Signed)
She appears to be euvolemic. She takes Lasix only as needed.

## 2013-10-12 NOTE — Patient Instructions (Signed)
Your physician has recommended you make the following change in your medication:  Increase Lisinopril to 20 mg daily   Your physician wants you to follow-up in: 6 months.  You will receive a reminder letter in the mail two months in advance. If you don't receive a letter, please call our office to schedule the follow-up appointment.

## 2013-10-12 NOTE — Progress Notes (Signed)
Primary care physician: Dr. Rutherford Nail  HPI  This is a 68 year old female who is here today for a followup visit. She had previous atrial myxoma resection and one-vessel CABG in 2012 done at [facility]. She has known nonischemic cardiomyopathy with previous ejection fraction of 45-50%, persistent atrial fibrillation maintained in sinus rhythm on sotalol and sick sinus syndrome status post pacemaker placement. She has multiple chronic medical conditions including hypertension, hyperlipidemia and diabetes. She had recurrent chest pain post CABG. Most recent nuclear stress test in March of 2014 showed no evidence of ischemia with normal ejection fraction. She has been doing reasonably well and denies chest pain or significant dyspnea. No palpitations, dizziness or syncope.   No Known Allergies   Current Outpatient Prescriptions on File Prior to Visit  Medication Sig Dispense Refill  . atorvastatin (LIPITOR) 80 MG tablet Take 80 mg by mouth daily.      . chlorthalidone (HYGROTON) 25 MG tablet Take 25 mg by mouth daily.      . fluticasone (FLONASE) 50 MCG/ACT nasal spray 1 spray as needed.       . furosemide (LASIX) 40 MG tablet Take 40 mg by mouth daily as needed.       Marland Kitchen levothyroxine (SYNTHROID, LEVOTHROID) 50 MCG tablet Take 50 mcg by mouth daily before breakfast.      . ranitidine (ZANTAC) 300 MG tablet Take 300 mg by mouth at bedtime.      . sotalol (BETAPACE) 80 MG tablet TAKE 1 TABLET (80 MG TOTAL) BY MOUTH EVERY 12 (TWELVE) HOURS.  60 tablet  3  . warfarin (COUMADIN) 2.5 MG tablet TAKE 1 TABLET (2.5 MG TOTAL) BY MOUTH AS DIRECTED.  30 tablet  3   No current facility-administered medications on file prior to visit.     Past Medical History  Diagnosis Date  . Postsurgical percutaneous transluminal coronary angioplasty status   . Type II or unspecified type diabetes mellitus without mention of complication, not stated as uncontrolled   . Other and unspecified hyperlipidemia   . Osteoporosis,  unspecified   . Lumbago   . Unspecified hypothyroidism   . Contusion of unspecified site   . Unspecified menopausal and postmenopausal disorder   . Cramp of limb   . Heartburn   . Contact dermatitis and other eczema, due to unspecified cause   . Other specified cardiac dysrhythmias(427.89)   . Benign neoplasm of heart   . Essential hypertension, benign   . Atrial myxoma     Status post resection in 2012 at [facility]  . Coronary atherosclerosis of unspecified type of vessel, native or graft     Status post CABG in March of 2012 at the time of atrial myxoma resection with LIMA to LAD done at [facility]  . Nonischemic cardiomyopathy     EF: 45% in 2012  . Nonischemic cardiomyopathy      Past Surgical History  Procedure Laterality Date  . Atrial myxoma excision Left   . Vaginal hysterectomy    . Coronary artery bypass graft  2011    [facility]  . Cardiac catheterization  2012    [facility]; right and left heart 75% ostial LAD lesion  . Insert / replace / remove pacemaker  02/2013    Dual chamber MDT advisa pacemaker. [id number]     Family History  Problem Relation Age of Onset  . Heart failure Mother      History   Social History  . Marital Status: Married    Spouse Name: N/A  Number of Children: N/A  . Years of Education: N/A   Occupational History  . Not on file.   Social History Main Topics  . Smoking status: Never Smoker   . Smokeless tobacco: Not on file  . Alcohol Use: No  . Drug Use: No  . Sexual Activity: Not on file   Other Topics Concern  . Not on file   Social History Narrative  . No narrative on file       PHYSICAL EXAM   BP 138/97  Pulse 76  Ht 4\' 11"  (1.499 m)  Wt 132 lb 4 oz (59.988 kg)  BMI 26.70 kg/m2 Constitutional: She is oriented to person, place, and time. She appears well-developed and well-nourished. No distress.  HENT: No nasal discharge.  Head: Normocephalic and atraumatic.  Eyes: Pupils are equal and round. Right eye exhibits no discharge. Left  eye exhibits no discharge.  Neck: Normal range of motion. Neck supple. No JVD present. No thyromegaly present.  Cardiovascular: Normal rate, regular rhythm, normal heart sounds. Exam reveals no gallop and no friction rub. No murmur heard.  Pulmonary/Chest: Effort normal and breath sounds normal. No stridor. No respiratory distress. She has no wheezes. She has no rales. She exhibits no tenderness.  Abdominal: Soft. Bowel sounds are normal. She exhibits no distension. There is no tenderness. There is no rebound and no guarding.  Musculoskeletal: Normal range of motion. She exhibits no edema and no tenderness.  Neurological: She is alert and oriented to person, place, and time. Coordination normal.  Skin: Skin is warm and dry. No rash noted. She is not diaphoretic. No erythema. No pallor.  Psychiatric: She has a normal mood and affect. Her behavior is normal. Judgment and thought content normal.     EKG: Sinus rhythm P:QRS - 1:1, Superior P axis, Short PRi, H Rate 76 -  Negative T-waves  -Possible  Anterior  ischemia.   Low voltage -possible pulmonary disease. Normal QT interval  ABNORMAL     ASSESSMENT AND PLAN

## 2013-10-12 NOTE — Assessment & Plan Note (Signed)
Status post resection. An echocardiogram every 2-3 years is recommended to evaluate for recurrence.

## 2013-10-15 DIAGNOSIS — E7800 Pure hypercholesterolemia, unspecified: Secondary | ICD-10-CM | POA: Insufficient documentation

## 2013-10-15 DIAGNOSIS — R609 Edema, unspecified: Secondary | ICD-10-CM | POA: Insufficient documentation

## 2013-10-15 DIAGNOSIS — D219 Benign neoplasm of connective and other soft tissue, unspecified: Secondary | ICD-10-CM | POA: Insufficient documentation

## 2013-10-24 ENCOUNTER — Ambulatory Visit (INDEPENDENT_AMBULATORY_CARE_PROVIDER_SITE_OTHER): Payer: Medicare Other

## 2013-10-24 DIAGNOSIS — Z5181 Encounter for therapeutic drug level monitoring: Secondary | ICD-10-CM

## 2013-10-24 DIAGNOSIS — I4891 Unspecified atrial fibrillation: Secondary | ICD-10-CM

## 2013-10-24 LAB — POCT INR: INR: 1.7

## 2013-10-25 DIAGNOSIS — E785 Hyperlipidemia, unspecified: Secondary | ICD-10-CM | POA: Diagnosis not present

## 2013-10-25 DIAGNOSIS — I10 Essential (primary) hypertension: Secondary | ICD-10-CM | POA: Diagnosis not present

## 2013-10-25 DIAGNOSIS — E119 Type 2 diabetes mellitus without complications: Secondary | ICD-10-CM | POA: Diagnosis not present

## 2013-10-25 DIAGNOSIS — E039 Hypothyroidism, unspecified: Secondary | ICD-10-CM | POA: Diagnosis not present

## 2013-11-28 ENCOUNTER — Ambulatory Visit (INDEPENDENT_AMBULATORY_CARE_PROVIDER_SITE_OTHER): Payer: Medicare Other

## 2013-11-28 DIAGNOSIS — I4891 Unspecified atrial fibrillation: Secondary | ICD-10-CM | POA: Diagnosis not present

## 2013-11-28 DIAGNOSIS — Z5181 Encounter for therapeutic drug level monitoring: Secondary | ICD-10-CM | POA: Diagnosis not present

## 2013-11-28 LAB — POCT INR: INR: 1.5

## 2013-11-28 MED ORDER — WARFARIN SODIUM 2.5 MG PO TABS: ORAL_TABLET | ORAL | Status: DC

## 2013-12-18 ENCOUNTER — Other Ambulatory Visit: Payer: Self-pay | Admitting: Cardiovascular Disease

## 2013-12-19 ENCOUNTER — Ambulatory Visit (INDEPENDENT_AMBULATORY_CARE_PROVIDER_SITE_OTHER): Payer: Medicare Other

## 2013-12-19 DIAGNOSIS — I4891 Unspecified atrial fibrillation: Secondary | ICD-10-CM | POA: Diagnosis not present

## 2013-12-19 DIAGNOSIS — Z5181 Encounter for therapeutic drug level monitoring: Secondary | ICD-10-CM

## 2013-12-19 LAB — POCT INR: INR: 1.7

## 2014-01-23 ENCOUNTER — Ambulatory Visit (INDEPENDENT_AMBULATORY_CARE_PROVIDER_SITE_OTHER): Payer: Medicare Other

## 2014-01-23 DIAGNOSIS — Z5181 Encounter for therapeutic drug level monitoring: Secondary | ICD-10-CM | POA: Diagnosis not present

## 2014-01-23 DIAGNOSIS — I4891 Unspecified atrial fibrillation: Secondary | ICD-10-CM | POA: Diagnosis not present

## 2014-01-23 LAB — POCT INR: INR: 1.9

## 2014-02-13 ENCOUNTER — Ambulatory Visit (INDEPENDENT_AMBULATORY_CARE_PROVIDER_SITE_OTHER): Payer: Medicare Other

## 2014-02-13 DIAGNOSIS — Z5181 Encounter for therapeutic drug level monitoring: Secondary | ICD-10-CM

## 2014-02-13 DIAGNOSIS — I4891 Unspecified atrial fibrillation: Secondary | ICD-10-CM | POA: Diagnosis not present

## 2014-02-13 LAB — POCT INR: INR: 2.3

## 2014-02-16 ENCOUNTER — Other Ambulatory Visit: Payer: Self-pay | Admitting: Cardiovascular Disease

## 2014-02-18 NOTE — Telephone Encounter (Signed)
Please review for refill, Thank you. 

## 2014-02-27 DIAGNOSIS — E785 Hyperlipidemia, unspecified: Secondary | ICD-10-CM | POA: Diagnosis not present

## 2014-02-27 DIAGNOSIS — E119 Type 2 diabetes mellitus without complications: Secondary | ICD-10-CM | POA: Diagnosis not present

## 2014-02-27 DIAGNOSIS — I10 Essential (primary) hypertension: Secondary | ICD-10-CM | POA: Diagnosis not present

## 2014-02-27 DIAGNOSIS — E039 Hypothyroidism, unspecified: Secondary | ICD-10-CM | POA: Diagnosis not present

## 2014-02-27 DIAGNOSIS — L84 Corns and callosities: Secondary | ICD-10-CM | POA: Diagnosis not present

## 2014-03-05 ENCOUNTER — Encounter: Payer: Medicare Other | Admitting: Internal Medicine

## 2014-03-12 ENCOUNTER — Encounter: Payer: Self-pay | Admitting: *Deleted

## 2014-03-15 IMAGING — CR DG LUMBAR SPINE COMPLETE W/ BEND
1 series · 7 of 7 positions shown · non-contrast
Comparison: none

REASON FOR EXAM: lumbago
COMMENTS:

[Series 1: w lumbar spine lat · 0.14mm/px · 7 of 7 slices shown]
[im 1/7]
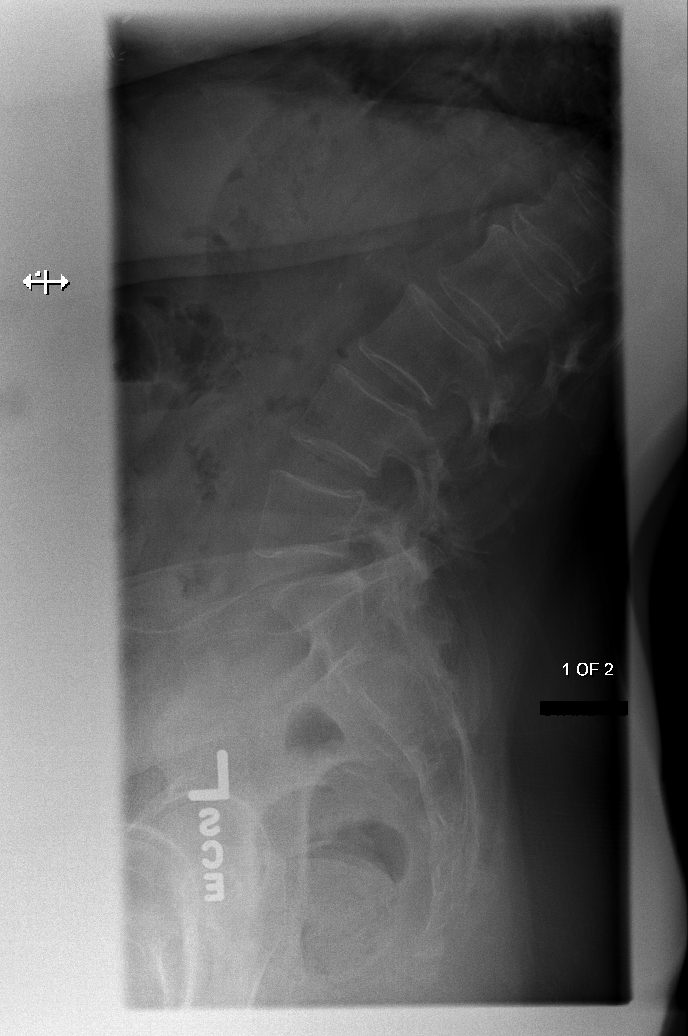
[im 2/7]
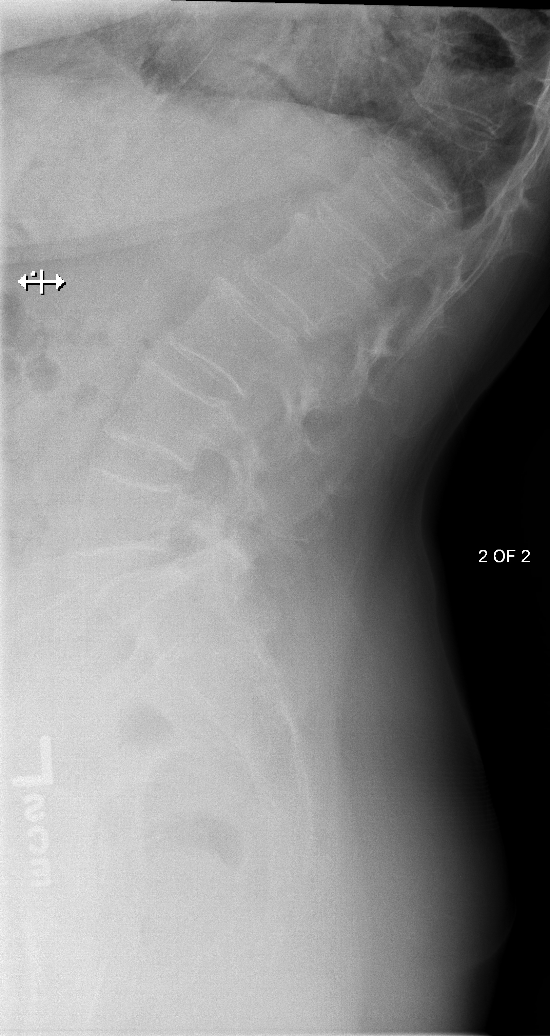
[im 3/7]
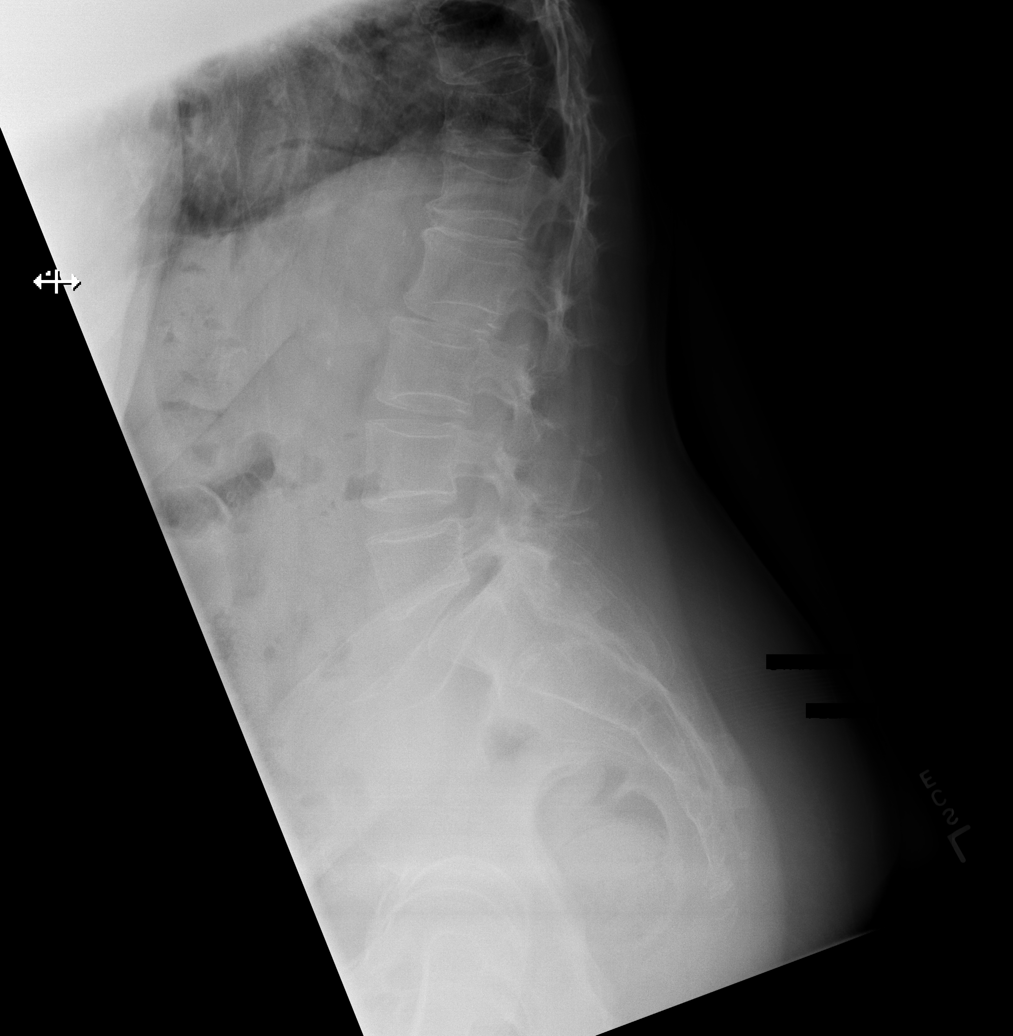
[im 4/7]
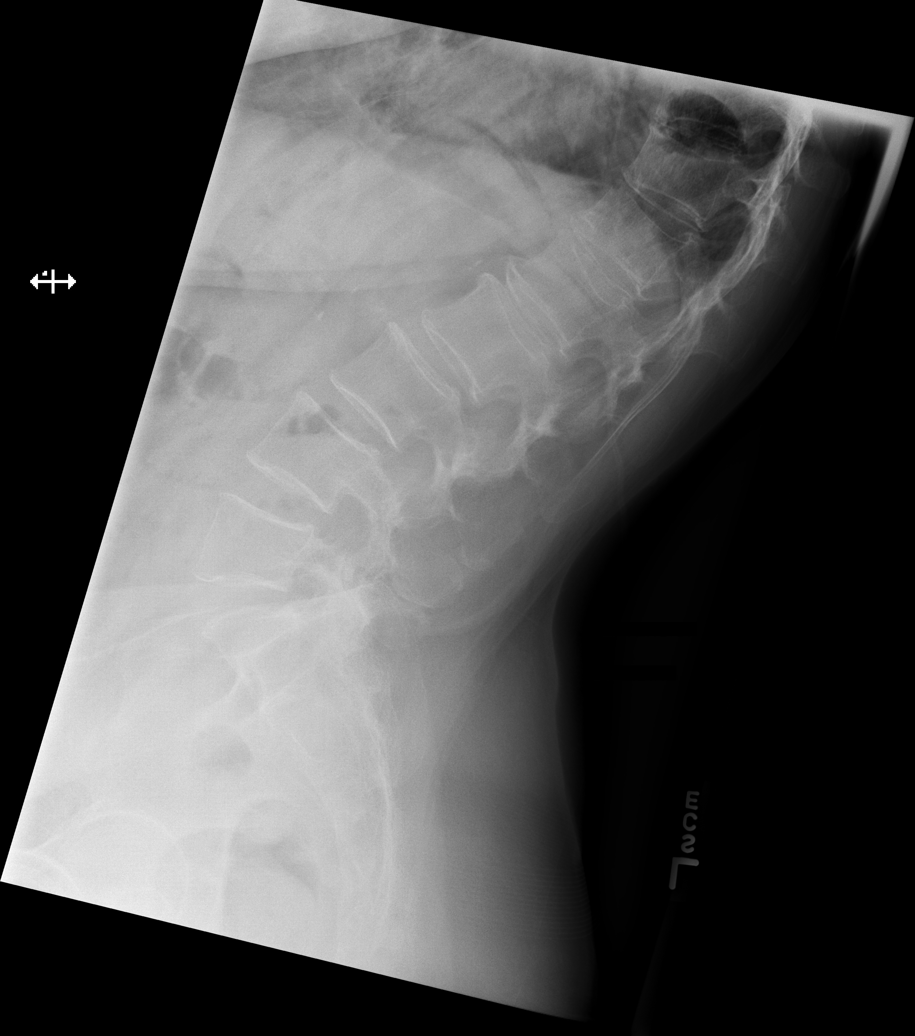
[im 5/7]
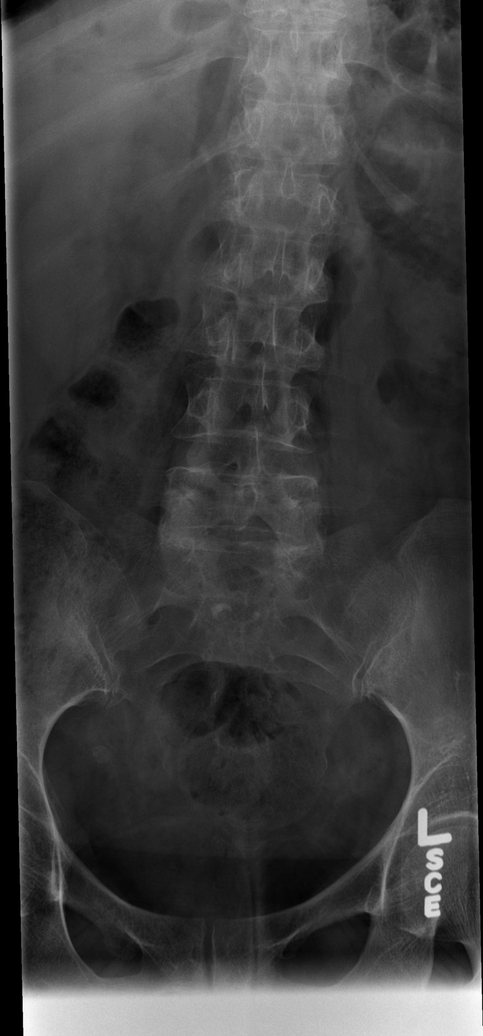
[im 6/7]
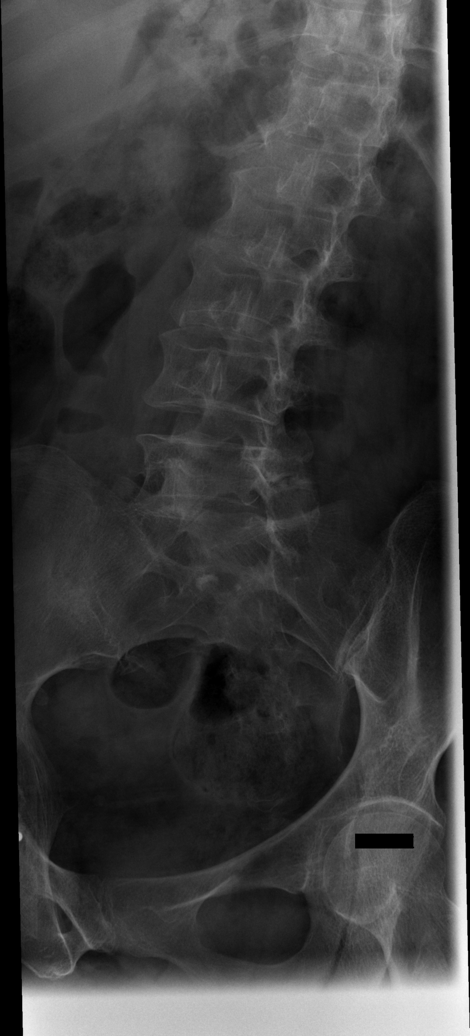
[im 7/7]
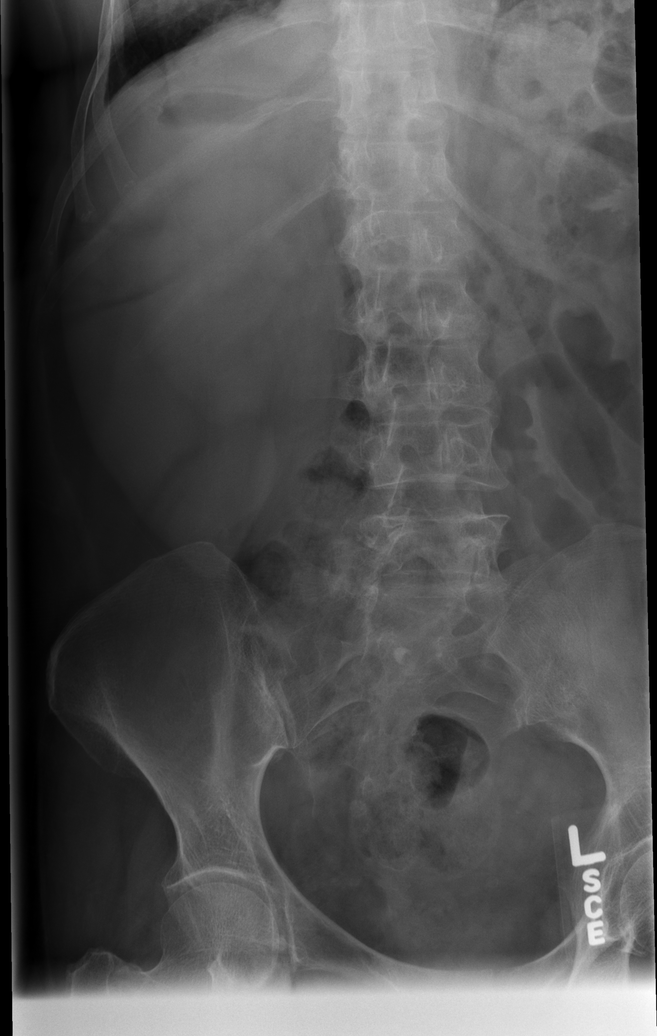

[7 of 7 positions shown; findings below may reference images not displayed]

PROCEDURE:     DXR - DXR LSP COMPLETE W/ FLEX/EXTEN  - September 05, 2012 [DATE]

RESULT:     Neutral, flexion and extension lateral views with the patient
standing along with full lumbar spine series were obtained. Within the
standing neutral position there appears to be an exaggerated lumbar lordosis
in the L4-S1 region. There does not appear to be significant difference
between the extension and standing neutral images. The standing lateral
flexion view shows slight reduction of the extension of the spine. There is
grade 1 anterolisthesis of L5 on S1 that appears to be present on the
lateral views diffusely without evidence of instability demonstrated. No
bony destruction is evident otherwise. There is no bony destruction. The
facets appear to be normally aligned. No fracture is seen.
IMPRESSION: 1. There is limited range of motion. Admitted for position it appears the
patient is in extension. There is no significant flexion motion. The study
does not show evidence of instability. Minimal anterolisthesis of L5 on S1
is noted.

[REDACTED]

## 2014-03-20 ENCOUNTER — Ambulatory Visit (INDEPENDENT_AMBULATORY_CARE_PROVIDER_SITE_OTHER): Payer: Medicare Other

## 2014-03-20 DIAGNOSIS — I4891 Unspecified atrial fibrillation: Secondary | ICD-10-CM

## 2014-03-20 DIAGNOSIS — Z5181 Encounter for therapeutic drug level monitoring: Secondary | ICD-10-CM

## 2014-03-20 LAB — POCT INR: INR: 1.6

## 2014-04-03 ENCOUNTER — Ambulatory Visit (INDEPENDENT_AMBULATORY_CARE_PROVIDER_SITE_OTHER): Payer: Medicare Other

## 2014-04-03 DIAGNOSIS — I4891 Unspecified atrial fibrillation: Secondary | ICD-10-CM | POA: Diagnosis not present

## 2014-04-03 DIAGNOSIS — Z5181 Encounter for therapeutic drug level monitoring: Secondary | ICD-10-CM | POA: Diagnosis not present

## 2014-04-03 LAB — POCT INR: INR: 1.8

## 2014-04-16 DIAGNOSIS — Z9181 History of falling: Secondary | ICD-10-CM | POA: Diagnosis not present

## 2014-04-16 DIAGNOSIS — Z1389 Encounter for screening for other disorder: Secondary | ICD-10-CM | POA: Diagnosis not present

## 2014-04-16 DIAGNOSIS — Z1211 Encounter for screening for malignant neoplasm of colon: Secondary | ICD-10-CM | POA: Diagnosis not present

## 2014-04-16 DIAGNOSIS — Z0000 Encounter for general adult medical examination without abnormal findings: Secondary | ICD-10-CM | POA: Diagnosis not present

## 2014-04-17 ENCOUNTER — Ambulatory Visit (INDEPENDENT_AMBULATORY_CARE_PROVIDER_SITE_OTHER): Payer: Medicare Other | Admitting: Pharmacist

## 2014-04-17 DIAGNOSIS — Z5181 Encounter for therapeutic drug level monitoring: Secondary | ICD-10-CM | POA: Diagnosis not present

## 2014-04-17 DIAGNOSIS — I4891 Unspecified atrial fibrillation: Secondary | ICD-10-CM

## 2014-04-17 LAB — POCT INR: INR: 2.1

## 2014-04-20 ENCOUNTER — Other Ambulatory Visit: Payer: Self-pay | Admitting: Cardiovascular Disease

## 2014-04-23 ENCOUNTER — Ambulatory Visit (INDEPENDENT_AMBULATORY_CARE_PROVIDER_SITE_OTHER): Payer: Medicare Other | Admitting: Internal Medicine

## 2014-04-23 ENCOUNTER — Encounter: Payer: Self-pay | Admitting: Internal Medicine

## 2014-04-23 VITALS — BP 104/78 | HR 72 | Ht 59.0 in | Wt 141.2 lb

## 2014-04-23 DIAGNOSIS — R0602 Shortness of breath: Secondary | ICD-10-CM

## 2014-04-23 DIAGNOSIS — Z45018 Encounter for adjustment and management of other part of cardiac pacemaker: Secondary | ICD-10-CM | POA: Diagnosis not present

## 2014-04-23 DIAGNOSIS — I2510 Atherosclerotic heart disease of native coronary artery without angina pectoris: Secondary | ICD-10-CM | POA: Diagnosis not present

## 2014-04-23 DIAGNOSIS — I495 Sick sinus syndrome: Secondary | ICD-10-CM | POA: Diagnosis not present

## 2014-04-23 NOTE — Progress Notes (Signed)
Patient Care Team: Ashok Norris, MD as PCP - General (Unknown Physician Specialty)   HPI  Felicia Poole is a 68 y.o. female Seen for pacemaker followup for atrial fibrillation and significant post termination pauses. She then given an event recorder by Dr. Fletcher Anon which demonstrate pauses were evident she and her daughter chose to go to [facility] where pacemaker insertion was accomplished  She complains that her heart rate races every time she walks.  She has a history of a prior atrial myxoma status post resection with one-vessel CABG at [facility] in 2012. She is a history of a nonischemic cardiomyopathy with ejection fraction 45% and a nuclear test March 2014 by Dr. Clayborn Bigness that apparently showed no ischemia and normal left ventricular function.    Past Medical History  Diagnosis Date  . Postsurgical percutaneous transluminal coronary angioplasty status   . Type II or unspecified type diabetes mellitus without mention of complication, not stated as uncontrolled   . Other and unspecified hyperlipidemia   . Osteoporosis, unspecified   . Lumbago   . Unspecified hypothyroidism   . Contusion of unspecified site   . Unspecified menopausal and postmenopausal disorder   . Cramp of limb   . Heartburn   . Contact dermatitis and other eczema, due to unspecified cause   . Other specified cardiac dysrhythmias(427.89)   . Benign neoplasm of heart   . Essential hypertension, benign   . Atrial myxoma     Status post resection in 2012 at [facility]  . Coronary atherosclerosis of unspecified type of vessel, native or graft     Status post CABG in March of 2012 at the time of atrial myxoma resection with LIMA to LAD done at [facility]  . Nonischemic cardiomyopathy     EF: 45% in 2012  . Nonischemic cardiomyopathy     Past Surgical History  Procedure Laterality Date  . Atrial myxoma excision Left   . Vaginal hysterectomy    . Coronary artery bypass graft  2011    [facility]  . Cardiac catheterization   2012    [facility]; right and left heart 75% ostial LAD lesion  . Insert / replace / remove pacemaker  02/2013    Dual chamber MDT advisa pacemaker. [id number]    Current Outpatient Prescriptions  Medication Sig Dispense Refill  . atorvastatin (LIPITOR) 80 MG tablet Take 80 mg by mouth daily.    . chlorthalidone (HYGROTON) 25 MG tablet Take 25 mg by mouth daily.    . fluticasone (FLONASE) 50 MCG/ACT nasal spray 1 spray as needed.     . furosemide (LASIX) 40 MG tablet Take 40 mg by mouth daily as needed.     Marland Kitchen levothyroxine (SYNTHROID, LEVOTHROID) 50 MCG tablet Take 50 mcg by mouth daily before breakfast.    . lisinopril (PRINIVIL,ZESTRIL) 20 MG tablet Take 1 tablet (20 mg total) by mouth daily. 90 tablet 3  . ranitidine (ZANTAC) 300 MG tablet Take 300 mg by mouth at bedtime.    . sotalol (BETAPACE) 80 MG tablet TAKE 1 TABLET BY MOUTH EVERY 12 HRS 60 tablet 3  . warfarin (COUMADIN) 2.5 MG tablet Take 1-1.5 tablets (2.5-3.75 mg total) by mouth daily. As directed 45 tablet 3   No current facility-administered medications for this visit.    No Known Allergies  Review of Systems negative except from HPI and PMH  Physical Exam BP 104/78 mmHg  Ht 4\' 11"  (1.499 m)  Wt 141 lb 4 oz (64.071 kg)  BMI 28.51 kg/m2 Well developed and well nourished in no acute distress HENT normal E scleral and icterus clear Neck Supple JVP flat; carotids brisk and full Clear to ausculation Device pocket well healed; without hematoma or erythema.  There is no tethering Regular rate and rhythm, no murmurs gallops or rub Soft with active bowel sounds No clubbing cyanosis none Edema Alert and oriented, grossly normal motor and sensory function Skin Warm and Dry   ECG demonstrates atrial pacing with a PR interval of 240 ms/08/42 Assessment and  Plan  Paroxysmal atrial fibrillation with posttermination pausing  Sinus node dysfunction  Pacemaker The patient's device was interrogated and the information was fully  reviewed.  The device was reprogrammed to  Decrease rate response to minimize her tachypalpitations  Atrial myxoma history of resection  tachypalpitations   She is bette with reprogramming even walking in the office

## 2014-04-23 NOTE — Patient Instructions (Signed)
Your physician wants you to follow-up in: 6 months with Nevin Bloodgood in the device clinic & 1 year with Dr. Caryl Comes. You will receive a reminder letter in the mail two months in advance. If you don't receive a letter, please call our office to schedule the follow-up appointment.  Your physician recommends that you continue on your current medications as directed. Please refer to the Current Medication list given to you today.

## 2014-05-09 ENCOUNTER — Ambulatory Visit (INDEPENDENT_AMBULATORY_CARE_PROVIDER_SITE_OTHER): Payer: Medicare Other | Admitting: Pharmacist

## 2014-05-09 DIAGNOSIS — I4891 Unspecified atrial fibrillation: Secondary | ICD-10-CM | POA: Diagnosis not present

## 2014-05-09 DIAGNOSIS — Z5181 Encounter for therapeutic drug level monitoring: Secondary | ICD-10-CM

## 2014-05-09 LAB — POCT INR: INR: 2.1

## 2014-06-05 ENCOUNTER — Ambulatory Visit (INDEPENDENT_AMBULATORY_CARE_PROVIDER_SITE_OTHER): Payer: Medicare Other

## 2014-06-05 DIAGNOSIS — Z5181 Encounter for therapeutic drug level monitoring: Secondary | ICD-10-CM

## 2014-06-05 DIAGNOSIS — I4891 Unspecified atrial fibrillation: Secondary | ICD-10-CM | POA: Diagnosis not present

## 2014-06-05 LAB — POCT INR: INR: 1.6

## 2014-06-17 ENCOUNTER — Other Ambulatory Visit: Payer: Self-pay | Admitting: Cardiovascular Disease

## 2014-06-17 NOTE — Telephone Encounter (Signed)
Please review for refill. Thanks!  

## 2014-06-21 ENCOUNTER — Ambulatory Visit (INDEPENDENT_AMBULATORY_CARE_PROVIDER_SITE_OTHER): Payer: Medicare Other | Admitting: Pharmacist

## 2014-06-21 DIAGNOSIS — Z5181 Encounter for therapeutic drug level monitoring: Secondary | ICD-10-CM

## 2014-06-21 DIAGNOSIS — I4891 Unspecified atrial fibrillation: Secondary | ICD-10-CM | POA: Diagnosis not present

## 2014-06-21 LAB — POCT INR: INR: 2.1

## 2014-07-17 ENCOUNTER — Ambulatory Visit (INDEPENDENT_AMBULATORY_CARE_PROVIDER_SITE_OTHER): Payer: Medicare Other | Admitting: Pharmacist

## 2014-07-17 DIAGNOSIS — Z5181 Encounter for therapeutic drug level monitoring: Secondary | ICD-10-CM

## 2014-07-17 DIAGNOSIS — I4891 Unspecified atrial fibrillation: Secondary | ICD-10-CM | POA: Diagnosis not present

## 2014-07-17 LAB — POCT INR: INR: 2.1

## 2014-07-24 ENCOUNTER — Telehealth: Payer: Self-pay | Admitting: Cardiology

## 2014-07-24 ENCOUNTER — Encounter: Payer: Medicare Other | Admitting: *Deleted

## 2014-07-24 NOTE — Telephone Encounter (Signed)
LMOVM reminding pt to send remote transmission.   

## 2014-07-25 ENCOUNTER — Encounter: Payer: Self-pay | Admitting: Cardiology

## 2014-08-06 DIAGNOSIS — Z7901 Long term (current) use of anticoagulants: Secondary | ICD-10-CM | POA: Diagnosis not present

## 2014-08-06 DIAGNOSIS — K219 Gastro-esophageal reflux disease without esophagitis: Secondary | ICD-10-CM | POA: Diagnosis not present

## 2014-08-06 DIAGNOSIS — Z950 Presence of cardiac pacemaker: Secondary | ICD-10-CM | POA: Diagnosis not present

## 2014-08-06 DIAGNOSIS — E559 Vitamin D deficiency, unspecified: Secondary | ICD-10-CM | POA: Diagnosis not present

## 2014-08-06 DIAGNOSIS — E669 Obesity, unspecified: Secondary | ICD-10-CM | POA: Diagnosis not present

## 2014-08-06 DIAGNOSIS — E785 Hyperlipidemia, unspecified: Secondary | ICD-10-CM | POA: Diagnosis not present

## 2014-08-06 DIAGNOSIS — I2510 Atherosclerotic heart disease of native coronary artery without angina pectoris: Secondary | ICD-10-CM | POA: Diagnosis not present

## 2014-08-06 DIAGNOSIS — I10 Essential (primary) hypertension: Secondary | ICD-10-CM | POA: Diagnosis not present

## 2014-08-14 ENCOUNTER — Ambulatory Visit (INDEPENDENT_AMBULATORY_CARE_PROVIDER_SITE_OTHER): Payer: Medicare Other

## 2014-08-14 DIAGNOSIS — Z5181 Encounter for therapeutic drug level monitoring: Secondary | ICD-10-CM | POA: Diagnosis not present

## 2014-08-14 DIAGNOSIS — I4891 Unspecified atrial fibrillation: Secondary | ICD-10-CM

## 2014-08-14 LAB — POCT INR: INR: 2

## 2014-08-21 ENCOUNTER — Other Ambulatory Visit: Payer: Self-pay | Admitting: *Deleted

## 2014-08-21 ENCOUNTER — Other Ambulatory Visit: Payer: Self-pay | Admitting: Cardiovascular Disease

## 2014-08-21 MED ORDER — SOTALOL HCL 80 MG PO TABS: ORAL_TABLET | ORAL | Status: DC

## 2014-08-21 NOTE — Telephone Encounter (Signed)
°  1. Which medications need to be refilled? Sotalol   2. Which pharmacy is medication to be sent to? [facility] in [place]  3. Do they need a 30 day or 90 day supply? 90 day please   4. Would they like a call back once the medication has been sent to the pharmacy? Yes

## 2014-08-21 NOTE — Telephone Encounter (Signed)
Refill sent for a 90 day supply for sotalol.

## 2014-09-25 ENCOUNTER — Ambulatory Visit (INDEPENDENT_AMBULATORY_CARE_PROVIDER_SITE_OTHER): Payer: Medicare Other

## 2014-09-25 DIAGNOSIS — Z5181 Encounter for therapeutic drug level monitoring: Secondary | ICD-10-CM

## 2014-09-25 DIAGNOSIS — I4891 Unspecified atrial fibrillation: Secondary | ICD-10-CM

## 2014-09-25 LAB — POCT INR: INR: 1.9

## 2014-11-06 ENCOUNTER — Ambulatory Visit (INDEPENDENT_AMBULATORY_CARE_PROVIDER_SITE_OTHER): Payer: Medicare Other

## 2014-11-06 DIAGNOSIS — I4891 Unspecified atrial fibrillation: Secondary | ICD-10-CM | POA: Diagnosis not present

## 2014-11-06 DIAGNOSIS — Z5181 Encounter for therapeutic drug level monitoring: Secondary | ICD-10-CM

## 2014-11-06 LAB — POCT INR: INR: 2.1

## 2014-11-21 ENCOUNTER — Other Ambulatory Visit: Payer: Self-pay | Admitting: Family Medicine

## 2014-12-10 ENCOUNTER — Encounter: Payer: Self-pay | Admitting: Family Medicine

## 2014-12-10 ENCOUNTER — Encounter (INDEPENDENT_AMBULATORY_CARE_PROVIDER_SITE_OTHER): Payer: Self-pay

## 2014-12-10 ENCOUNTER — Ambulatory Visit (INDEPENDENT_AMBULATORY_CARE_PROVIDER_SITE_OTHER): Payer: Medicare Other | Admitting: Family Medicine

## 2014-12-10 VITALS — BP 118/74 | HR 79 | Temp 97.7°F | Resp 16 | Ht <= 58 in | Wt 151.4 lb

## 2014-12-10 DIAGNOSIS — E669 Obesity, unspecified: Secondary | ICD-10-CM | POA: Insufficient documentation

## 2014-12-10 DIAGNOSIS — E785 Hyperlipidemia, unspecified: Secondary | ICD-10-CM | POA: Diagnosis not present

## 2014-12-10 DIAGNOSIS — I429 Cardiomyopathy, unspecified: Secondary | ICD-10-CM | POA: Diagnosis not present

## 2014-12-10 DIAGNOSIS — I10 Essential (primary) hypertension: Secondary | ICD-10-CM | POA: Diagnosis not present

## 2014-12-10 DIAGNOSIS — E039 Hypothyroidism, unspecified: Secondary | ICD-10-CM | POA: Diagnosis not present

## 2014-12-10 DIAGNOSIS — I5020 Unspecified systolic (congestive) heart failure: Secondary | ICD-10-CM | POA: Diagnosis not present

## 2014-12-10 DIAGNOSIS — Z8639 Personal history of other endocrine, nutritional and metabolic disease: Secondary | ICD-10-CM | POA: Diagnosis not present

## 2014-12-10 DIAGNOSIS — E119 Type 2 diabetes mellitus without complications: Secondary | ICD-10-CM | POA: Diagnosis not present

## 2014-12-10 DIAGNOSIS — I480 Paroxysmal atrial fibrillation: Secondary | ICD-10-CM | POA: Diagnosis not present

## 2014-12-10 DIAGNOSIS — I428 Other cardiomyopathies: Secondary | ICD-10-CM

## 2014-12-10 LAB — POCT GLYCOSYLATED HEMOGLOBIN (HGB A1C): Hemoglobin A1C: 5.5

## 2014-12-10 LAB — GLUCOSE, POCT (MANUAL RESULT ENTRY): POC Glucose: 91 mg/dl (ref 70–99)

## 2014-12-10 NOTE — Progress Notes (Signed)
Name: Felicia Poole   MRN: [id number]    DOB: November 06, 1945   Date:12/10/2014       Progress Note  Subjective  Chief Complaint  Chief Complaint  Patient presents with  . Hypertension  . Thyroid Problem  . Hyperlipidemia  . Gastrophageal Reflux    Hypertension This is a chronic problem. The current episode started more than 1 year ago. The problem is unchanged. The problem is controlled. Associated symptoms include anxiety, chest pain, headaches, malaise/fatigue, palpitations, PND and shortness of breath. Pertinent negatives include no blurred vision, neck pain or orthopnea. There are no associated agents to hypertension. Risk factors for coronary artery disease include dyslipidemia, obesity, diabetes mellitus, post-menopausal state and sedentary lifestyle. Past treatments include calcium channel blockers and diuretics. Hypertensive end-organ damage includes a thyroid problem.  Thyroid Problem Presents for follow-up visit. Symptoms include fatigue, palpitations and weight gain. Patient reports no anxiety, constipation, diarrhea, tremors or weight loss. The symptoms have been improving. Past treatments include levothyroxine. Her past medical history is significant for diabetes and hyperlipidemia.  Hyperlipidemia This is a chronic problem. The current episode started more than 1 year ago. The problem is uncontrolled. Recent lipid tests were reviewed and are high. Exacerbating diseases include diabetes, hypothyroidism and obesity. Associated symptoms include chest pain and shortness of breath. Pertinent negatives include no focal weakness or myalgias. Risk factors for coronary artery disease include diabetes mellitus, dyslipidemia, hypertension, obesity, post-menopausal, a sedentary lifestyle and stress.  Gastrophageal Reflux She complains of chest pain and heartburn. She reports no coughing, no nausea or no sore throat. This is a chronic problem. The current episode started more than 1 year ago. The  problem occurs occasionally. The symptoms are aggravated by certain foods. Associated symptoms include fatigue. Pertinent negatives include no weight loss.  Heart Problem This is a chronic problem. The current episode started more than 1 year ago. The problem occurs rarely. The problem has been resolved. Associated symptoms include chest pain, fatigue, headaches and weakness. Pertinent negatives include no chills, congestion, coughing, fever, myalgias, nausea, neck pain, sore throat or vomiting. The symptoms are aggravated by stress. The treatment provided moderate relief.  Arthritis Presents for follow-up visit. She complains of pain and stiffness. Affected locations include the right knee, left knee, left shoulder and right shoulder. Associated symptoms include fatigue. Pertinent negatives include no diarrhea, dysuria, fever or weight loss. There is no history of rheumatoid arthritis.  Her pertinent risk factors include overuse. Past treatments include acetaminophen.      Past Medical History  Diagnosis Date  . Postsurgical percutaneous transluminal coronary angioplasty status   . Type II or unspecified type diabetes mellitus without mention of complication, not stated as uncontrolled   . Other and unspecified hyperlipidemia   . Osteoporosis, unspecified   . Lumbago   . Unspecified hypothyroidism   . Contusion of unspecified site   . Unspecified menopausal and postmenopausal disorder   . Cramp of limb   . Heartburn   . Contact dermatitis and other eczema, due to unspecified cause   . Other specified cardiac dysrhythmias(427.89)   . Benign neoplasm of heart   . Essential hypertension, benign   . Atrial myxoma     Status post resection in 2012 at [facility]  . Coronary atherosclerosis of unspecified type of vessel, native or graft     Status post CABG in March of 2012 at the time of atrial myxoma resection with LIMA to LAD done at [facility]  . Nonischemic cardiomyopathy  EF: 45% in 2012  .  Nonischemic cardiomyopathy     History  Substance Use Topics  . Smoking status: Never Smoker   . Smokeless tobacco: Not on file  . Alcohol Use: No     Current outpatient prescriptions:  .  amLODipine (NORVASC) 10 MG tablet, TAKE 1 TABLET BY MOUTH DAILY, Disp: 30 tablet, Rfl: 7 .  atorvastatin (LIPITOR) 80 MG tablet, Take 80 mg by mouth daily., Disp: , Rfl:  .  chlorthalidone (HYGROTON) 25 MG tablet, Take 25 mg by mouth daily., Disp: , Rfl:  .  fluticasone (FLONASE) 50 MCG/ACT nasal spray, 1 spray as needed. , Disp: , Rfl:  .  furosemide (LASIX) 40 MG tablet, Take 40 mg by mouth daily as needed. , Disp: , Rfl:  .  levothyroxine (SYNTHROID, LEVOTHROID) 50 MCG tablet, Take 50 mcg by mouth daily before breakfast., Disp: , Rfl:  .  levothyroxine (SYNTHROID, LEVOTHROID) 75 MCG tablet, TAKE 1 TABLET BY MOUTH EVERY DAY, Disp: 30 tablet, Rfl: 7 .  lisinopril (PRINIVIL,ZESTRIL) 20 MG tablet, Take 1 tablet (20 mg total) by mouth daily., Disp: 90 tablet, Rfl: 3 .  ranitidine (ZANTAC) 300 MG tablet, Take 300 mg by mouth at bedtime., Disp: , Rfl:  .  sotalol (BETAPACE) 80 MG tablet, TAKE 1 TABLET BY MOUTH EVERY 12 HRS, Disp: 180 tablet, Rfl: 3 .  warfarin (COUMADIN) 2.5 MG tablet, TAKE 1-1.5 TABLETS (2.5-3.75 MG TOTAL) BY MOUTH DAILY. AS DIRECTED, Disp: 45 tablet, Rfl: 3  No Known Allergies  Review of Systems  Constitutional: Positive for weight gain, malaise/fatigue and fatigue. Negative for fever, chills and weight loss.  HENT: Negative for congestion, hearing loss, sore throat and tinnitus.   Eyes: Negative for blurred vision, double vision and redness.  Respiratory: Positive for shortness of breath. Negative for cough and hemoptysis.   Cardiovascular: Positive for chest pain, palpitations, leg swelling and PND. Negative for orthopnea and claudication.  Gastrointestinal: Positive for heartburn. Negative for nausea, vomiting, diarrhea, constipation and blood in stool.  Genitourinary: Negative  for dysuria, urgency, frequency and hematuria.  Musculoskeletal: Positive for back pain, joint pain, arthritis and stiffness. Negative for myalgias, falls and neck pain.  Skin: Negative for itching.  Neurological: Positive for dizziness, weakness and headaches. Negative for tingling, tremors, focal weakness, seizures and loss of consciousness.  Endo/Heme/Allergies: Does not bruise/bleed easily.  Psychiatric/Behavioral: Negative for depression and substance abuse. The patient is not nervous/anxious and does not have insomnia.      Objective  Filed Vitals:   12/10/14 1506  BP: 118/74  Pulse: 79  Temp: 97.7 F (36.5 C)  Resp: 16  Height: 4\' 7"  (1.397 m)  Weight: 151 lb 7 oz (68.692 kg)  SpO2: 96%     Physical Exam  Constitutional: She is oriented to person, place, and time and well-developed, well-nourished, and in no distress.  HENT:  Head: Normocephalic.  Eyes: EOM are normal. Pupils are equal, round, and reactive to light.  Neck: Normal range of motion. No thyromegaly present.  Cardiovascular: Normal rate, regular rhythm and normal heart sounds.   No murmur heard. Pulmonary/Chest: Effort normal and breath sounds normal.  Abdominal: Soft. Bowel sounds are normal.  Musculoskeletal: Normal range of motion. She exhibits no edema.  Neurological: She is alert and oriented to person, place, and time. No cranial nerve deficit. Gait normal.  Skin: Skin is warm and dry. No rash noted.  Psychiatric: Memory and affect normal.      Assessment & Plan  1.  Obesity Encourage dietary compliance and exercise as tolerated  2. Essential hypertension, benign Well-controlled  3. Paroxysmal atrial fibrillation Controlled  4. Nonischemic cardiomyopathy Controlled and followed by cardiologist  5. Congestive heart failure with left ventricular systolic dysfunction Controlled follow-up with cardiologist - Comprehensive metabolic panel  6. Hypothyroidism, unspecified hypothyroidism  type Today - TSH  7. Type 2 diabetes mellitus without complication   - POCT Glucose (CBG) - POCT HgB A1C  9. Hyperlipidemia Pap today - Lipid panel

## 2014-12-11 LAB — COMPREHENSIVE METABOLIC PANEL
A/G RATIO: 1.6 (ref 1.1–2.5)
ALBUMIN: 4.1 g/dL (ref 3.6–4.8)
ALK PHOS: 98 IU/L (ref 39–117)
ALT: 13 IU/L (ref 0–32)
AST: 17 IU/L (ref 0–40)
BUN / CREAT RATIO: 16 (ref 11–26)
BUN: 16 mg/dL (ref 8–27)
Bilirubin Total: 0.6 mg/dL (ref 0.0–1.2)
CO2: 24 mmol/L (ref 18–29)
CREATININE: 1.01 mg/dL — AB (ref 0.57–1.00)
Calcium: 9.1 mg/dL (ref 8.7–10.3)
Chloride: 105 mmol/L (ref 97–108)
GFR calc Af Amer: 66 mL/min/{1.73_m2} (ref >59)
GFR, EST NON AFRICAN AMERICAN: 57 mL/min/{1.73_m2} — AB (ref >59)
GLOBULIN, TOTAL: 2.5 g/dL (ref 1.5–4.5)
Glucose: 91 mg/dL (ref 65–99)
Potassium: 4.5 mmol/L (ref 3.5–5.2)
SODIUM: 142 mmol/L (ref 134–144)
TOTAL PROTEIN: 6.6 g/dL (ref 6.0–8.5)

## 2014-12-11 LAB — LIPID PANEL
CHOLESTEROL TOTAL: 227 mg/dL — AB (ref 100–199)
Chol/HDL Ratio: 4.7 ratio units — ABNORMAL HIGH (ref 0.0–4.4)
HDL: 48 mg/dL (ref >39)
LDL Calculated: 151 mg/dL — ABNORMAL HIGH (ref 0–99)
TRIGLYCERIDES: 138 mg/dL (ref 0–149)
VLDL Cholesterol Cal: 28 mg/dL (ref 5–40)

## 2014-12-11 LAB — TSH: TSH: 0.527 u[IU]/mL (ref 0.450–4.500)

## 2014-12-12 ENCOUNTER — Other Ambulatory Visit: Payer: Self-pay | Admitting: Family Medicine

## 2014-12-15 NOTE — Patient Instructions (Signed)
Compliant with diet and exercise

## 2014-12-18 ENCOUNTER — Encounter: Payer: Self-pay | Admitting: Internal Medicine

## 2014-12-20 ENCOUNTER — Ambulatory Visit (INDEPENDENT_AMBULATORY_CARE_PROVIDER_SITE_OTHER): Payer: Medicare Other

## 2014-12-20 DIAGNOSIS — I480 Paroxysmal atrial fibrillation: Secondary | ICD-10-CM

## 2014-12-20 DIAGNOSIS — I4891 Unspecified atrial fibrillation: Secondary | ICD-10-CM | POA: Diagnosis not present

## 2014-12-20 DIAGNOSIS — Z5181 Encounter for therapeutic drug level monitoring: Secondary | ICD-10-CM

## 2014-12-20 LAB — POCT INR: INR: 1.5

## 2014-12-26 ENCOUNTER — Other Ambulatory Visit: Payer: Self-pay | Admitting: Pharmacist

## 2014-12-26 ENCOUNTER — Other Ambulatory Visit: Payer: Self-pay | Admitting: Family Medicine

## 2014-12-26 MED ORDER — WARFARIN SODIUM 2.5 MG PO TABS: ORAL_TABLET | ORAL | Status: DC

## 2014-12-27 ENCOUNTER — Ambulatory Visit (INDEPENDENT_AMBULATORY_CARE_PROVIDER_SITE_OTHER): Payer: Medicare Other

## 2014-12-27 DIAGNOSIS — Z5181 Encounter for therapeutic drug level monitoring: Secondary | ICD-10-CM

## 2014-12-27 DIAGNOSIS — I480 Paroxysmal atrial fibrillation: Secondary | ICD-10-CM

## 2014-12-27 DIAGNOSIS — I4891 Unspecified atrial fibrillation: Secondary | ICD-10-CM | POA: Diagnosis not present

## 2014-12-27 LAB — POCT INR: INR: 1.5

## 2015-01-10 ENCOUNTER — Ambulatory Visit (INDEPENDENT_AMBULATORY_CARE_PROVIDER_SITE_OTHER): Payer: Medicare Other | Admitting: Pharmacist

## 2015-01-10 DIAGNOSIS — Z5181 Encounter for therapeutic drug level monitoring: Secondary | ICD-10-CM | POA: Diagnosis not present

## 2015-01-10 DIAGNOSIS — I480 Paroxysmal atrial fibrillation: Secondary | ICD-10-CM

## 2015-01-10 DIAGNOSIS — I4891 Unspecified atrial fibrillation: Secondary | ICD-10-CM | POA: Diagnosis not present

## 2015-01-10 LAB — POCT INR: INR: 1.7

## 2015-01-24 ENCOUNTER — Encounter: Payer: Medicare Other | Admitting: Pharmacist

## 2015-01-24 ENCOUNTER — Ambulatory Visit (INDEPENDENT_AMBULATORY_CARE_PROVIDER_SITE_OTHER): Payer: Medicare Other | Admitting: *Deleted

## 2015-01-24 DIAGNOSIS — I480 Paroxysmal atrial fibrillation: Secondary | ICD-10-CM

## 2015-01-24 DIAGNOSIS — I4891 Unspecified atrial fibrillation: Secondary | ICD-10-CM | POA: Diagnosis not present

## 2015-01-24 DIAGNOSIS — Z5181 Encounter for therapeutic drug level monitoring: Secondary | ICD-10-CM

## 2015-01-24 LAB — POCT INR: INR: 1.2

## 2015-01-29 ENCOUNTER — Ambulatory Visit (INDEPENDENT_AMBULATORY_CARE_PROVIDER_SITE_OTHER): Payer: Medicare Other | Admitting: Pharmacist

## 2015-01-29 DIAGNOSIS — Z5181 Encounter for therapeutic drug level monitoring: Secondary | ICD-10-CM | POA: Diagnosis not present

## 2015-01-29 DIAGNOSIS — I480 Paroxysmal atrial fibrillation: Secondary | ICD-10-CM | POA: Diagnosis not present

## 2015-01-29 DIAGNOSIS — I4891 Unspecified atrial fibrillation: Secondary | ICD-10-CM | POA: Diagnosis not present

## 2015-01-29 LAB — POCT INR: INR: 1.4

## 2015-01-29 MED ORDER — WARFARIN SODIUM 2.5 MG PO TABS: ORAL_TABLET | ORAL | Status: DC

## 2015-02-05 ENCOUNTER — Ambulatory Visit (INDEPENDENT_AMBULATORY_CARE_PROVIDER_SITE_OTHER): Payer: Medicare Other

## 2015-02-05 DIAGNOSIS — I4891 Unspecified atrial fibrillation: Secondary | ICD-10-CM

## 2015-02-05 DIAGNOSIS — Z5181 Encounter for therapeutic drug level monitoring: Secondary | ICD-10-CM

## 2015-02-05 DIAGNOSIS — I480 Paroxysmal atrial fibrillation: Secondary | ICD-10-CM

## 2015-02-05 LAB — POCT INR: INR: 2.2

## 2015-03-05 ENCOUNTER — Ambulatory Visit (INDEPENDENT_AMBULATORY_CARE_PROVIDER_SITE_OTHER): Payer: Medicare Other

## 2015-03-05 DIAGNOSIS — Z5181 Encounter for therapeutic drug level monitoring: Secondary | ICD-10-CM

## 2015-03-05 DIAGNOSIS — I4891 Unspecified atrial fibrillation: Secondary | ICD-10-CM

## 2015-03-05 DIAGNOSIS — I480 Paroxysmal atrial fibrillation: Secondary | ICD-10-CM | POA: Diagnosis not present

## 2015-03-05 LAB — POCT INR: INR: 2.2

## 2015-04-02 ENCOUNTER — Encounter (INDEPENDENT_AMBULATORY_CARE_PROVIDER_SITE_OTHER): Payer: Medicare Other

## 2015-04-02 DIAGNOSIS — I4891 Unspecified atrial fibrillation: Secondary | ICD-10-CM | POA: Diagnosis not present

## 2015-04-02 LAB — POCT INR: INR: 0

## 2015-04-02 NOTE — Progress Notes (Signed)
This encounter was created in error - please disregard.  This encounter was created in error - please disregard.

## 2015-04-09 ENCOUNTER — Ambulatory Visit (INDEPENDENT_AMBULATORY_CARE_PROVIDER_SITE_OTHER): Payer: Medicare Other

## 2015-04-09 DIAGNOSIS — Z5181 Encounter for therapeutic drug level monitoring: Secondary | ICD-10-CM | POA: Diagnosis not present

## 2015-04-09 DIAGNOSIS — I4891 Unspecified atrial fibrillation: Secondary | ICD-10-CM

## 2015-04-09 DIAGNOSIS — I480 Paroxysmal atrial fibrillation: Secondary | ICD-10-CM

## 2015-04-09 LAB — POCT INR: INR: 2.2

## 2015-04-15 ENCOUNTER — Ambulatory Visit: Payer: Medicare Other | Admitting: Family Medicine

## 2015-04-24 ENCOUNTER — Encounter: Payer: Medicare Other | Admitting: Internal Medicine

## 2015-04-29 ENCOUNTER — Ambulatory Visit (INDEPENDENT_AMBULATORY_CARE_PROVIDER_SITE_OTHER): Payer: Medicare Other | Admitting: Family Medicine

## 2015-04-29 ENCOUNTER — Encounter: Payer: Self-pay | Admitting: Family Medicine

## 2015-04-29 VITALS — BP 106/68 | HR 83 | Temp 98.2°F | Resp 16 | Ht <= 58 in | Wt 148.1 lb

## 2015-04-29 DIAGNOSIS — E7800 Pure hypercholesterolemia, unspecified: Secondary | ICD-10-CM

## 2015-04-29 DIAGNOSIS — Z7901 Long term (current) use of anticoagulants: Secondary | ICD-10-CM | POA: Insufficient documentation

## 2015-04-29 DIAGNOSIS — E1169 Type 2 diabetes mellitus with other specified complication: Secondary | ICD-10-CM

## 2015-04-29 DIAGNOSIS — D692 Other nonthrombocytopenic purpura: Secondary | ICD-10-CM | POA: Insufficient documentation

## 2015-04-29 DIAGNOSIS — B354 Tinea corporis: Secondary | ICD-10-CM | POA: Diagnosis not present

## 2015-04-29 DIAGNOSIS — E1129 Type 2 diabetes mellitus with other diabetic kidney complication: Secondary | ICD-10-CM | POA: Diagnosis not present

## 2015-04-29 DIAGNOSIS — R809 Proteinuria, unspecified: Secondary | ICD-10-CM

## 2015-04-29 DIAGNOSIS — R238 Other skin changes: Secondary | ICD-10-CM | POA: Diagnosis not present

## 2015-04-29 DIAGNOSIS — I482 Chronic atrial fibrillation, unspecified: Secondary | ICD-10-CM

## 2015-04-29 DIAGNOSIS — I10 Essential (primary) hypertension: Secondary | ICD-10-CM | POA: Diagnosis not present

## 2015-04-29 DIAGNOSIS — I2510 Atherosclerotic heart disease of native coronary artery without angina pectoris: Secondary | ICD-10-CM | POA: Diagnosis not present

## 2015-04-29 DIAGNOSIS — Z23 Encounter for immunization: Secondary | ICD-10-CM

## 2015-04-29 DIAGNOSIS — E039 Hypothyroidism, unspecified: Secondary | ICD-10-CM | POA: Diagnosis not present

## 2015-04-29 DIAGNOSIS — R233 Spontaneous ecchymoses: Secondary | ICD-10-CM

## 2015-04-29 LAB — POCT UA - MICROALBUMIN: MICROALBUMIN (UR) POC: 100 mg/L

## 2015-04-29 LAB — GLUCOSE, POCT (MANUAL RESULT ENTRY): POC Glucose: 97 mg/dl (ref 70–99)

## 2015-04-29 LAB — POCT GLYCOSYLATED HEMOGLOBIN (HGB A1C): Hemoglobin A1C: 5.7

## 2015-04-29 MED ORDER — TERBINAFINE HCL 1 % EX CREA: 1.0000 "application " | TOPICAL_CREAM | Freq: Two times a day (BID) | CUTANEOUS | Status: DC

## 2015-04-29 NOTE — Progress Notes (Signed)
Name: Felicia Poole   MRN: [id number]    DOB: May 02, 1946   Date:04/29/2015       Progress Note  Subjective  Chief Complaint  Chief Complaint  Patient presents with  . Hypertension    4 month follow up  . Diabetes  . Hypothyroidism  . Hyperlipidemia    HPI  Hypertension   Patient presents for follow-up of hypertension. It has been present for over over 5 years.  Patient states that there is compliance with medical regimen which consists of hypertonic 25 mg daily lisinopril 20 mg daily furosemide 40 mg daily amlodipine 10 mg daily . There is no end organ disease. Cardiac risk factors include hypertension hyperlipidemia and diabetes.  Exercise regimen consist of minimal walking .  Diet consist of salt restriction  Diabetes  Patient presents for follow-up of diabetes which is present for over 5 years. Is currently on a regimen of diet and exercise only . Patient states is compliance with their diet and exercise. There's been no hypoglycemic episodes and there is no polyuria polydipsia polyphagia. His average fasting glucoses been in the low around - with a high around - . There is no end organ disease.  Last diabetic eye exam was earlier this year.   Last visit with dietitian was earlier this year. Last microalbumin was obtained today and is 100 . Marland Kitchen  Patient presents for follow-up of hypothyroidism. It has been present for over 5 years.  Current symptoms consist of fatigue . Current medication regimen consist of levothyroxin 50 micrograms daily .   There is good compliance with regimen.    Coronary artery disease and atrial fibrillation  Patient currently followed by cardiologist at [facility]. She is on a regimen of warfarin 2.5 mg and Betapace 80 mg daily. She currently denies any sustained problem with any irregularity of heartbeat.  Past Medical History  Diagnosis Date  . Postsurgical percutaneous transluminal coronary angioplasty status   . Type II or unspecified type diabetes  mellitus without mention of complication, not stated as uncontrolled   . Other and unspecified hyperlipidemia   . Osteoporosis, unspecified   . Lumbago   . Unspecified hypothyroidism   . Contusion of unspecified site   . Unspecified menopausal and postmenopausal disorder   . Cramp of limb   . Heartburn   . Contact dermatitis and other eczema, due to unspecified cause   . Other specified cardiac dysrhythmias(427.89)   . Benign neoplasm of heart   . Essential hypertension, benign   . Atrial myxoma     Status post resection in 2012 at [facility]  . Coronary atherosclerosis of unspecified type of vessel, native or graft     Status post CABG in March of 2012 at the time of atrial myxoma resection with LIMA to LAD done at [facility]  . Nonischemic cardiomyopathy ([place])     EF: 45% in 2012  . Nonischemic cardiomyopathy [facility])     Social History  Substance Use Topics  . Smoking status: Never Smoker   . Smokeless tobacco: Not on file  . Alcohol Use: No     Current outpatient prescriptions:  .  amLODipine (NORVASC) 10 MG tablet, TAKE 1 TABLET BY MOUTH DAILY, Disp: 30 tablet, Rfl: 7 .  atorvastatin (LIPITOR) 80 MG tablet, Take 80 mg by mouth daily., Disp: , Rfl:  .  chlorthalidone (HYGROTON) 25 MG tablet, Take 25 mg by mouth daily., Disp: , Rfl:  .  fluticasone (FLONASE) 50 MCG/ACT nasal spray, SPRAY 1 SPRAY  IN EACH NOSTRIL 2 TIMES A DAY, Disp: 16 g, Rfl: 0 .  furosemide (LASIX) 40 MG tablet, Take 40 mg by mouth daily as needed. , Disp: , Rfl:  .  levothyroxine (SYNTHROID, LEVOTHROID) 50 MCG tablet, Take 50 mcg by mouth daily before breakfast., Disp: , Rfl:  .  levothyroxine (SYNTHROID, LEVOTHROID) 75 MCG tablet, TAKE 1 TABLET BY MOUTH EVERY DAY, Disp: 30 tablet, Rfl: 7 .  lisinopril (PRINIVIL,ZESTRIL) 20 MG tablet, Take 1 tablet (20 mg total) by mouth daily., Disp: 90 tablet, Rfl: 3 .  lisinopril (PRINIVIL,ZESTRIL) 5 MG tablet, TAKE 1 TABLET BY MOUTH DAILY, Disp: 30 tablet, Rfl: 7 .  ranitidine  (ZANTAC) 300 MG tablet, Take 300 mg by mouth at bedtime., Disp: , Rfl:  .  sotalol (BETAPACE) 80 MG tablet, TAKE 1 TABLET BY MOUTH EVERY 12 HRS, Disp: 180 tablet, Rfl: 3 .  warfarin (COUMADIN) 2.5 MG tablet, Take 1.5 - 2 tablets daily as directed by [facility], Disp: 60 tablet, Rfl: 3  No Known Allergies  Review of Systems  Constitutional: Positive for malaise/fatigue. Negative for fever, chills and weight loss.  HENT: Negative for congestion, hearing loss, sore throat and tinnitus.   Eyes: Negative for blurred vision, double vision and redness.  Respiratory: Positive for shortness of breath. Negative for cough and hemoptysis.   Cardiovascular: Positive for chest pain, palpitations and leg swelling. Negative for orthopnea and claudication.  Gastrointestinal: Negative for heartburn, nausea, vomiting, diarrhea, constipation and blood in stool.  Genitourinary: Negative for dysuria, urgency, frequency and hematuria.  Musculoskeletal: Positive for myalgias and joint pain. Negative for back pain, falls and neck pain.  Skin: Negative for itching.  Neurological: Negative for dizziness, tingling, tremors, focal weakness, seizures, loss of consciousness, weakness and headaches.  Endo/Heme/Allergies: Bruises/bleeds easily.  Psychiatric/Behavioral: Positive for depression and memory loss. Negative for substance abuse. The patient is nervous/anxious. The patient does not have insomnia.      Objective  Filed Vitals:   04/29/15 1022  BP: 106/68  Pulse: 83  Temp: 98.2 F (36.8 C)  TempSrc: Oral  Resp: 16  Height: 4\' 7"  (1.397 m)  Weight: 148 lb 1.6 oz (67.178 kg)  SpO2: 96%     Physical Exam  Constitutional: She is oriented to person, place, and time.  Obese and in no acute distress  HENT:  Head: Normocephalic.  Eyes: EOM are normal. Pupils are equal, round, and reactive to light.  Neck: Normal range of motion. No thyromegaly present.  Cardiovascular: Intact distal pulses.   No  murmur heard. Irregularly irregular rate and rhythm  Pulmonary/Chest: Effort normal and breath sounds normal.  Abdominal: Soft. Bowel sounds are normal.  Musculoskeletal: She exhibits tenderness. She exhibits no edema.  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 No cranial nerve deficit. Gait normal.  Skin: Skin is warm and dry. No rash noted.  Psychiatric: Memory normal.  Flat with mild cognitive impairment      Assessment & Plan  1. Type 2 diabetes mellitus with other specified complication (HCC) Stable - POCT HgB A1C - POCT Glucose (CBG) - POCT UA - Microalbumin - Comprehensive Metabolic Panel (CMET)  2. Arteriosclerosis of coronary artery Stable - Comprehensive Metabolic Panel (CMET) - Magnesium  3. Chronic atrial fibrillation (HCC) Stable - Comprehensive Metabolic Panel (CMET) - Magnesium  4. Essential hypertension, benign Well-controlled - Comprehensive Metabolic Panel (CMET)  5. Hypothyroidism, unspecified hypothyroidism type TSH - Comprehensive Metabolic  Panel (CMET)  6. Hypercholesterolemia Stable  7. Diabetes mellitus with microalbuminuria (HCC) Microalbumin of 100 - Lipid panel  8. Tinea corporis With wheezing or diarrhea - terbinafine (LAMISIL AT) 1 % cream; Apply 1 application topically 2 (two) times daily.  Dispense: 30 g; Refill: 0 - TSH  9. Easy bruisability Continues   10. Chronic anticoagulation Per cardiologist  11. Need for influenza vaccination Given today - Flu vaccine HIGH DOSE PF (Fluzone High dose)  12. Need for pneumococcal vaccination Given today - Pneumococcal conjugate vaccine 13-valent

## 2015-04-30 LAB — COMPREHENSIVE METABOLIC PANEL
ALK PHOS: 87 IU/L (ref 39–117)
ALT: 11 IU/L (ref 0–32)
AST: 18 IU/L (ref 0–40)
Albumin/Globulin Ratio: 1.6 (ref 1.1–2.5)
Albumin: 4.3 g/dL (ref 3.5–4.8)
BUN/Creatinine Ratio: 18 (ref 11–26)
BUN: 16 mg/dL (ref 8–27)
Bilirubin Total: 0.5 mg/dL (ref 0.0–1.2)
CO2: 26 mmol/L (ref 18–29)
CREATININE: 0.88 mg/dL (ref 0.57–1.00)
Calcium: 9.7 mg/dL (ref 8.7–10.3)
Chloride: 106 mmol/L (ref 97–106)
GFR calc Af Amer: 77 mL/min/{1.73_m2} (ref >59)
GFR, EST NON AFRICAN AMERICAN: 67 mL/min/{1.73_m2} (ref >59)
Globulin, Total: 2.7 g/dL (ref 1.5–4.5)
Glucose: 108 mg/dL — ABNORMAL HIGH (ref 65–99)
Potassium: 4.5 mmol/L (ref 3.5–5.2)
Sodium: 143 mmol/L (ref 136–144)
Total Protein: 7 g/dL (ref 6.0–8.5)

## 2015-04-30 LAB — LIPID PANEL
CHOLESTEROL TOTAL: 228 mg/dL — AB (ref 100–199)
Chol/HDL Ratio: 5 ratio units — ABNORMAL HIGH (ref 0.0–4.4)
HDL: 46 mg/dL (ref >39)
LDL CALC: 153 mg/dL — AB (ref 0–99)
TRIGLYCERIDES: 143 mg/dL (ref 0–149)
VLDL CHOLESTEROL CAL: 29 mg/dL (ref 5–40)

## 2015-04-30 LAB — TSH: TSH: 0.547 u[IU]/mL (ref 0.450–4.500)

## 2015-04-30 LAB — MAGNESIUM: Magnesium: 2 mg/dL (ref 1.6–2.3)

## 2015-05-07 ENCOUNTER — Telehealth: Payer: Self-pay | Admitting: Emergency Medicine

## 2015-05-07 NOTE — Telephone Encounter (Signed)
Patient  daughter notified. Will check her medication and make sure she is taking Atorvastatin.

## 2015-05-08 ENCOUNTER — Encounter: Payer: Self-pay | Admitting: Family Medicine

## 2015-05-08 ENCOUNTER — Ambulatory Visit (INDEPENDENT_AMBULATORY_CARE_PROVIDER_SITE_OTHER): Payer: Medicare Other | Admitting: Family Medicine

## 2015-05-08 VITALS — BP 106/71 | HR 84 | Temp 98.2°F | Resp 17 | Ht <= 58 in | Wt 150.7 lb

## 2015-05-08 DIAGNOSIS — I2510 Atherosclerotic heart disease of native coronary artery without angina pectoris: Secondary | ICD-10-CM | POA: Diagnosis not present

## 2015-05-08 DIAGNOSIS — H109 Unspecified conjunctivitis: Secondary | ICD-10-CM | POA: Diagnosis not present

## 2015-05-08 DIAGNOSIS — E119 Type 2 diabetes mellitus without complications: Secondary | ICD-10-CM | POA: Insufficient documentation

## 2015-05-08 MED ORDER — CIPROFLOXACIN HCL 0.3 % OP SOLN
2.0000 [drp] | OPHTHALMIC | Status: AC
Start: 2015-05-08 — End: 2015-05-13

## 2015-05-08 NOTE — Progress Notes (Addendum)
Name: Felicia Poole   MRN: [id number]    DOB: [birth year]   Date:05/08/2015       Progress Note  Subjective  Chief Complaint  Chief Complaint  Patient presents with  . Acute Visit    Patient states her eys has been Red and runny x1 week, hurts on bottom of eye    HPI  Eye irritation  Patient presents with a one-week history of red and runny eyes. She states her some discomfort in the inferior aspect of the eyes.  There is minimal minimal photophobia and minimal discomfort. The eyes are not particularly matted together in the a.m. There's been no history of any transient trauma to the eyes. There is no history of glaucoma.     Past Medical History  Diagnosis Date  . Postsurgical percutaneous transluminal coronary angioplasty status   . Type II or unspecified type diabetes mellitus without mention of complication, not stated as uncontrolled   . Other and unspecified hyperlipidemia   . Osteoporosis, unspecified   . Lumbago   . Unspecified hypothyroidism   . Contusion of unspecified site   . Unspecified menopausal and postmenopausal disorder   . Cramp of limb   . Heartburn   . Contact dermatitis and other eczema, due to unspecified cause   . Other specified cardiac dysrhythmias(427.89)   . Benign neoplasm of heart   . Essential hypertension, benign   . Atrial myxoma     Status post resection in 2012 at [facility]  . Coronary atherosclerosis of unspecified type of vessel, native or graft     Status post CABG in March of 2012 at the time of atrial myxoma resection with LIMA to LAD done at [facility]  . Nonischemic cardiomyopathy ([place])     EF: 45% in 2012  . Nonischemic cardiomyopathy [facility])     Social History  Substance Use Topics  . Smoking status: Never Smoker   . Smokeless tobacco: Not on file  . Alcohol Use: No     Current outpatient prescriptions:  .  amLODipine (NORVASC) 10 MG tablet, TAKE 1 TABLET BY MOUTH DAILY, Disp: 30 tablet, Rfl: 7 .  atorvastatin (LIPITOR) 80 MG  tablet, Take 80 mg by mouth daily., Disp: , Rfl:  .  chlorthalidone (HYGROTON) 25 MG tablet, Take 25 mg by mouth daily., Disp: , Rfl:  .  fluticasone (FLONASE) 50 MCG/ACT nasal spray, SPRAY 1 SPRAY IN EACH NOSTRIL 2 TIMES A DAY, Disp: 16 g, Rfl: 0 .  furosemide (LASIX) 40 MG tablet, Take 40 mg by mouth daily as needed. , Disp: , Rfl:  .  levothyroxine (SYNTHROID, LEVOTHROID) 50 MCG tablet, Take 50 mcg by mouth daily before breakfast., Disp: , Rfl:  .  levothyroxine (SYNTHROID, LEVOTHROID) 75 MCG tablet, TAKE 1 TABLET BY MOUTH EVERY DAY, Disp: 30 tablet, Rfl: 7 .  lisinopril (PRINIVIL,ZESTRIL) 20 MG tablet, Take 1 tablet (20 mg total) by mouth daily., Disp: 90 tablet, Rfl: 3 .  lisinopril (PRINIVIL,ZESTRIL) 5 MG tablet, TAKE 1 TABLET BY MOUTH DAILY, Disp: 30 tablet, Rfl: 7 .  ranitidine (ZANTAC) 300 MG tablet, Take 300 mg by mouth at bedtime., Disp: , Rfl:  .  sotalol (BETAPACE) 80 MG tablet, TAKE 1 TABLET BY MOUTH EVERY 12 HRS, Disp: 180 tablet, Rfl: 3 .  terbinafine (LAMISIL AT) 1 % cream, Apply 1 application topically 2 (two) times daily., Disp: 30 g, Rfl: 0 .  warfarin (COUMADIN) 2.5 MG tablet, Take 1.5 - 2 tablets daily as directed by [facility],  Disp: 60 tablet, Rfl: 3  No Known Allergies  Review of Systems  Eyes: Positive for pain, discharge and redness.     Objective  Filed Vitals:   05/08/15 0934  BP: 106/71  Pulse: 84  Temp: 98.2 F (36.8 C)  TempSrc: Oral  Resp: 17  Height: 4\' 7"  (1.397 m)  Weight: 150 lb 11.2 oz (68.357 kg)  SpO2: 93%     Physical Exam  Eyes:  Bilateral conjunctival injection which is watery. No exudate. Ophthalmoscopic exam is unremarkable. There is full extraocular movement      Assessment & Plan  . 1. Bilateral conjunctivitis  - ciprofloxacin (CILOXAN) 0.3 % ophthalmic solution; Place 2 drops into both eyes every 4 (four) hours while awake. Administer 1 drop, every 2 hours, while awake, for 2 days. Then 1 drop, every 4 hours, while  awake, for the next 5 days.  Dispense: 5 mL; Refill: 0

## 2015-05-08 NOTE — Patient Instructions (Signed)
Allergic Conjunctivitis Allergic conjunctivitis is inflammation of the clear membrane that covers the white part of your eye and the inner surface of your eyelid (conjunctiva), and it is caused by allergies. The blood vessels in the conjunctiva become inflamed, and this causes the eye to become red or pink, and it often causes itchiness in the eye. Allergic conjunctivitis cannot be spread by one person to another person (noncontagious). CAUSES This condition is caused by an allergic reaction. Common causes of an allergic reaction (allergens) include:  Dust.  Pollen.  Mold.  Animal dander or secretions. RISK FACTORS This condition is more likely to develop if you are exposed to high levels of allergens that cause the allergic reaction. This might include being outdoors when air pollen levels are high or being around animals that you are allergic to. SYMPTOMS Symptoms of this condition may include:  Eye redness.  Tearing of the eyes.  Watery eyes.  Itchy eyes.  Burning feeling in the eyes.  Clear drainage from the eyes.  Swollen eyelids. DIAGNOSIS This condition may be diagnosed by medical history and physical exam. If you have drainage from your eyes, it may be tested to rule out other causes of conjunctivitis. TREATMENT Treatment for this condition often includes medicines. These may be eye drops, ointments, or oral medicines. They may be prescription medicines or over-the-counter medicines. HOME CARE INSTRUCTIONS  Take or apply medicines only as directed by your health care provider.  Do not touch or rub your eyes.  Do not wear contact lenses until the inflammation is gone. Wear glasses instead.  Do not wear eye makeup until the inflammation is gone.  Apply a cool, clean washcloth to your eye for 10-20 minutes, 3-4 times a day.  Try to avoid whatever allergen is causing the allergic reaction. SEEK MEDICAL CARE IF:  Your symptoms get worse.  You have pus draining  from your eye.  You have new symptoms.  You have a fever.   This information is not intended to replace advice given to you by your health care provider. Make sure you discuss any questions you have with your health care provider.   Document Released: 08/14/2002 Document Revised: 06/14/2014 Document Reviewed: 03/05/2014 Elsevier Interactive Patient Education 2016 [employer]  

## 2015-05-21 ENCOUNTER — Ambulatory Visit (INDEPENDENT_AMBULATORY_CARE_PROVIDER_SITE_OTHER): Payer: Medicare Other

## 2015-05-21 DIAGNOSIS — I4891 Unspecified atrial fibrillation: Secondary | ICD-10-CM | POA: Diagnosis not present

## 2015-05-21 DIAGNOSIS — I482 Chronic atrial fibrillation, unspecified: Secondary | ICD-10-CM

## 2015-05-21 DIAGNOSIS — Z5181 Encounter for therapeutic drug level monitoring: Secondary | ICD-10-CM | POA: Diagnosis not present

## 2015-05-21 LAB — POCT INR: INR: 1.9

## 2015-05-29 ENCOUNTER — Other Ambulatory Visit: Payer: Self-pay

## 2015-06-10 ENCOUNTER — Other Ambulatory Visit: Payer: Self-pay

## 2015-06-10 MED ORDER — AMLODIPINE BESYLATE 10 MG PO TABS: 10.0000 mg | ORAL_TABLET | Freq: Every day | ORAL | Status: DC

## 2015-06-10 MED ORDER — LISINOPRIL 20 MG PO TABS: 20.0000 mg | ORAL_TABLET | Freq: Every day | ORAL | Status: DC

## 2015-06-10 MED ORDER — LEVOTHYROXINE SODIUM 75 MCG PO TABS: 75.0000 ug | ORAL_TABLET | Freq: Every day | ORAL | Status: DC

## 2015-06-24 ENCOUNTER — Encounter: Payer: Medicare Other | Admitting: Internal Medicine

## 2015-07-02 ENCOUNTER — Ambulatory Visit (INDEPENDENT_AMBULATORY_CARE_PROVIDER_SITE_OTHER): Payer: Medicare Other

## 2015-07-02 DIAGNOSIS — I4891 Unspecified atrial fibrillation: Secondary | ICD-10-CM | POA: Diagnosis not present

## 2015-07-02 DIAGNOSIS — I482 Chronic atrial fibrillation, unspecified: Secondary | ICD-10-CM

## 2015-07-02 DIAGNOSIS — Z5181 Encounter for therapeutic drug level monitoring: Secondary | ICD-10-CM

## 2015-07-02 LAB — POCT INR: INR: 2.2

## 2015-07-08 ENCOUNTER — Encounter: Payer: Self-pay | Admitting: Internal Medicine

## 2015-07-08 ENCOUNTER — Ambulatory Visit (INDEPENDENT_AMBULATORY_CARE_PROVIDER_SITE_OTHER): Payer: Medicare Other | Admitting: Internal Medicine

## 2015-07-08 VITALS — BP 110/78 | HR 73 | Ht <= 58 in | Wt 142.5 lb

## 2015-07-08 DIAGNOSIS — I429 Cardiomyopathy, unspecified: Secondary | ICD-10-CM | POA: Diagnosis not present

## 2015-07-08 DIAGNOSIS — I5020 Unspecified systolic (congestive) heart failure: Secondary | ICD-10-CM

## 2015-07-08 DIAGNOSIS — I482 Chronic atrial fibrillation, unspecified: Secondary | ICD-10-CM

## 2015-07-08 DIAGNOSIS — I428 Other cardiomyopathies: Secondary | ICD-10-CM

## 2015-07-08 LAB — CUP PACEART INCLINIC DEVICE CHECK
Implantable Lead Implant Date: 20140919
Implantable Lead Implant Date: 20140919
Implantable Lead Location: 753860
Implantable Lead Model: 5086
Implantable Lead Model: 5086
Lead Channel Pacing Threshold Amplitude: 0.75 V
Lead Channel Pacing Threshold Amplitude: 1 V
Lead Channel Pacing Threshold Pulse Width: 0.4 ms
Lead Channel Sensing Intrinsic Amplitude: 13 mV
Lead Channel Setting Pacing Amplitude: 1.5 V
Lead Channel Setting Pacing Amplitude: 2 V
MDC IDC LEAD LOCATION: 753859
MDC IDC MSMT LEADCHNL RA PACING THRESHOLD PULSEWIDTH: 0.4 ms
MDC IDC SESS DTM: 20170204104200
MDC IDC SET LEADCHNL RV PACING PULSEWIDTH: 0.4 ms
MDC IDC SET LEADCHNL RV SENSING SENSITIVITY: 0.9 mV
MDC IDC STAT BRADY AP VP PERCENT: 0.1 %
MDC IDC STAT BRADY AP VS PERCENT: 99.8 %
MDC IDC STAT BRADY AS VP PERCENT: 0.1 %
MDC IDC STAT BRADY AS VS PERCENT: 0.1 %

## 2015-07-08 NOTE — Patient Instructions (Addendum)
Medication Instructions: - no changes  Labwork: - none  Procedures/Testing: - none  Follow-Up: - Your physician wants you to follow-up in: 6 months with Dr. Caryl Comes. You will receive a reminder letter in the mail two months in advance. If you don't receive a letter, please call our office to schedule the follow-up appointment.   Any Additional Special Instructions Will Be Listed Below (If Applicable).     If you need a refill on your cardiac medications before your next appointment, please call your pharmacy.

## 2015-07-08 NOTE — Progress Notes (Signed)
Patient Care Team: Ashok Norris, MD as PCP - General (Unknown Physician Specialty)   HPI  Felicia Poole is a 70 y.o. female Seen for pacemaker followup for atrial fibrillation and significant post termination pauses. She then given an event recorder by Dr. Fletcher Anon which demonstrate pauses were evident;  she and her daughter chose to go to [facility] where pacemaker insertion was accomplished.    She is tired but she her daughter just recently moved.  She denies chest pain or shortness of breath. No notable edema or palpitations.  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 She is a history of a nonischemic cardiomyopathy with ejection fraction 45% and a nuclear test March 2014 by Dr. Clayborn Bigness that apparently showed no ischemia and normal left ventricular function.    Past Medical History  Diagnosis Date  . Postsurgical percutaneous transluminal coronary angioplasty status   . Type II or unspecified type diabetes mellitus without mention of complication, not stated as uncontrolled   . Other and unspecified hyperlipidemia   . Osteoporosis, unspecified   . Lumbago   . Unspecified hypothyroidism   . Contusion of unspecified site   . Unspecified menopausal and postmenopausal disorder   . Cramp of limb   . Heartburn   . Contact dermatitis and other eczema, due to unspecified cause   . Other specified cardiac dysrhythmias(427.89)   . Benign neoplasm of heart   . Essential hypertension, benign   . Atrial myxoma     Status post resection in 2012 at [facility]  . Coronary atherosclerosis of unspecified type of vessel, native or graft     Status post CABG in March of 2012 at the time of atrial myxoma resection with LIMA to LAD done at [facility]  . Nonischemic cardiomyopathy ([place])     EF: 45% in 2012  . Nonischemic cardiomyopathy [facility])     Past Surgical History  Procedure Laterality Date  . Atrial myxoma excision Left   . Vaginal  hysterectomy    . Coronary artery bypass graft  2011    [facility]  . Cardiac catheterization  2012    [facility]; right and left heart 75% ostial LAD lesion  . Insert / replace / remove pacemaker  02/2013    Dual chamber MDT advisa pacemaker. [id number]    Current Outpatient Prescriptions  Medication Sig Dispense Refill  . amLODipine (NORVASC) 10 MG tablet Take 1 tablet (10 mg total) by mouth daily. 90 tablet 0  . atorvastatin (LIPITOR) 80 MG tablet Take 80 mg by mouth daily.    . chlorthalidone (HYGROTON) 25 MG tablet Take 25 mg by mouth daily.    . fluticasone (FLONASE) 50 MCG/ACT nasal spray SPRAY 1 SPRAY IN EACH NOSTRIL 2 TIMES A DAY 16 g 0  . furosemide (LASIX) 40 MG tablet Take 40 mg by mouth daily as needed.     Marland Kitchen levothyroxine (SYNTHROID, LEVOTHROID) 50 MCG tablet Take 50 mcg by mouth daily before breakfast.    . levothyroxine (SYNTHROID, LEVOTHROID) 75 MCG tablet Take 1 tablet (75 mcg total) by mouth daily. 90 tablet 0  . lisinopril (PRINIVIL,ZESTRIL) 20 MG tablet Take 1 tablet (20 mg total) by mouth daily. 90 tablet 0  . ranitidine (ZANTAC) 300 MG tablet Take 300 mg by mouth at bedtime.    . sotalol (BETAPACE) 80 MG tablet TAKE 1 TABLET BY MOUTH EVERY 12 HRS 180 tablet 3  . terbinafine (LAMISIL  AT) 1 % cream Apply 1 application topically 2 (two) times daily. 30 g 0  . warfarin (COUMADIN) 2.5 MG tablet Take 1.5 - 2 tablets daily as directed by [facility] 60 tablet 3   No current facility-administered medications for this visit.    No Known Allergies  Review of Systems negative except from HPI and PMH  Physical Exam BP 110/78 mmHg  Pulse 73  Ht 4\' 7"  (1.397 m)  Wt 142 lb 8 oz (64.638 kg)  BMI 33.12 kg/m2 Well developed and well nourished in no acute distress HENT normal E scleral and icterus clear Neck Supple JVP flat; carotids brisk and full Clear to ausculation Device pocket well healed; without hematoma or erythema.  There is no tethering superficial collaterals Regular  rate and rhythm, no murmurs gallops or rub Soft with active bowel sounds No clubbing cyanosis no  Edema Alert and oriented, grossly normal motor and sensory function Skin Warm and Dry   ECG11/15  demonstrates atrial pacing with a PR interval of 23/09/38 ECG repeated today demonstrates a QTC of 451 again with atrial pacing.   Blood work was apparently checked by her PCP a few weeks ago. I've called his office. Assessment and  Plan  Paroxysmal atrial fibrillation with posttermination pausing  Sinus node dysfunction  100% apacing  High risk medication surveillance  Pacemaker The patient's device was interrogated and the information was fully reviewed.  The device was reprogrammed to  Decrease rate response to minimize her tachypalpitations  Atrial myxoma history of resection    No significant interval atrial fibrillation  No bleeding issues on her warfarin  We have reviewed proarrhythmia related to sotalol; we will work on getting her blood work  We will plan to see her again in 6 months given her high risk medication

## 2015-07-24 ENCOUNTER — Encounter: Payer: Self-pay | Admitting: Internal Medicine

## 2015-08-22 ENCOUNTER — Other Ambulatory Visit: Payer: Self-pay | Admitting: Family Medicine

## 2015-08-22 ENCOUNTER — Other Ambulatory Visit: Payer: Self-pay | Admitting: Internal Medicine

## 2015-08-28 ENCOUNTER — Ambulatory Visit: Payer: Self-pay | Admitting: Family Medicine

## 2015-09-10 ENCOUNTER — Ambulatory Visit (INDEPENDENT_AMBULATORY_CARE_PROVIDER_SITE_OTHER): Payer: Medicare Other

## 2015-09-10 DIAGNOSIS — Z5181 Encounter for therapeutic drug level monitoring: Secondary | ICD-10-CM

## 2015-09-10 DIAGNOSIS — I482 Chronic atrial fibrillation, unspecified: Secondary | ICD-10-CM

## 2015-09-10 DIAGNOSIS — I4891 Unspecified atrial fibrillation: Secondary | ICD-10-CM

## 2015-09-10 LAB — POCT INR: INR: 2.1

## 2015-10-06 ENCOUNTER — Emergency Department: Payer: Medicare Other

## 2015-10-06 ENCOUNTER — Emergency Department
Admission: EM | Admit: 2015-10-06 | Discharge: 2015-10-06 | Disposition: A | Discharge status: Home / Self Care | Payer: Medicare Other | Attending: Emergency Medicine | Admitting: Emergency Medicine

## 2015-10-06 ENCOUNTER — Encounter: Payer: Self-pay | Admitting: Emergency Medicine

## 2015-10-06 DIAGNOSIS — I4891 Unspecified atrial fibrillation: Secondary | ICD-10-CM | POA: Insufficient documentation

## 2015-10-06 DIAGNOSIS — I5020 Unspecified systolic (congestive) heart failure: Secondary | ICD-10-CM | POA: Insufficient documentation

## 2015-10-06 DIAGNOSIS — M810 Age-related osteoporosis without current pathological fracture: Secondary | ICD-10-CM | POA: Diagnosis not present

## 2015-10-06 DIAGNOSIS — Z950 Presence of cardiac pacemaker: Secondary | ICD-10-CM | POA: Insufficient documentation

## 2015-10-06 DIAGNOSIS — W230XXA Caught, crushed, jammed, or pinched between moving objects, initial encounter: Secondary | ICD-10-CM | POA: Diagnosis not present

## 2015-10-06 DIAGNOSIS — Y929 Unspecified place or not applicable: Secondary | ICD-10-CM | POA: Diagnosis not present

## 2015-10-06 DIAGNOSIS — I2510 Atherosclerotic heart disease of native coronary artery without angina pectoris: Secondary | ICD-10-CM | POA: Diagnosis not present

## 2015-10-06 DIAGNOSIS — Z7901 Long term (current) use of anticoagulants: Secondary | ICD-10-CM | POA: Diagnosis not present

## 2015-10-06 DIAGNOSIS — E669 Obesity, unspecified: Secondary | ICD-10-CM | POA: Diagnosis not present

## 2015-10-06 DIAGNOSIS — Y999 Unspecified external cause status: Secondary | ICD-10-CM | POA: Diagnosis not present

## 2015-10-06 DIAGNOSIS — Z951 Presence of aortocoronary bypass graft: Secondary | ICD-10-CM | POA: Diagnosis not present

## 2015-10-06 DIAGNOSIS — I110 Hypertensive heart disease with heart failure: Secondary | ICD-10-CM | POA: Insufficient documentation

## 2015-10-06 DIAGNOSIS — S61211A Laceration without foreign body of left index finger without damage to nail, initial encounter: Secondary | ICD-10-CM | POA: Diagnosis not present

## 2015-10-06 DIAGNOSIS — S6992XA Unspecified injury of left wrist, hand and finger(s), initial encounter: Secondary | ICD-10-CM | POA: Diagnosis not present

## 2015-10-06 DIAGNOSIS — Z7951 Long term (current) use of inhaled steroids: Secondary | ICD-10-CM | POA: Diagnosis not present

## 2015-10-06 DIAGNOSIS — I429 Cardiomyopathy, unspecified: Secondary | ICD-10-CM | POA: Diagnosis not present

## 2015-10-06 DIAGNOSIS — E119 Type 2 diabetes mellitus without complications: Secondary | ICD-10-CM | POA: Insufficient documentation

## 2015-10-06 DIAGNOSIS — S60022A Contusion of left index finger without damage to nail, initial encounter: Secondary | ICD-10-CM | POA: Diagnosis not present

## 2015-10-06 DIAGNOSIS — S61219A Laceration without foreign body of unspecified finger without damage to nail, initial encounter: Secondary | ICD-10-CM

## 2015-10-06 DIAGNOSIS — Y939 Activity, unspecified: Secondary | ICD-10-CM | POA: Diagnosis not present

## 2015-10-06 DIAGNOSIS — E039 Hypothyroidism, unspecified: Secondary | ICD-10-CM | POA: Insufficient documentation

## 2015-10-06 DIAGNOSIS — Z79899 Other long term (current) drug therapy: Secondary | ICD-10-CM | POA: Diagnosis not present

## 2015-10-06 DIAGNOSIS — S6000XA Contusion of unspecified finger without damage to nail, initial encounter: Secondary | ICD-10-CM

## 2015-10-06 MED ORDER — BACITRACIN ZINC 500 UNIT/GM EX OINT
TOPICAL_OINTMENT | CUTANEOUS | Status: AC
  Administered 2015-10-06: 1 via TOPICAL
  Filled 2015-10-06: qty 0.9

## 2015-10-06 MED ORDER — TRAMADOL HCL 50 MG PO TABS
50.0000 mg | ORAL_TABLET | Freq: Once | ORAL | Status: AC
  Administered 2015-10-06: 50 mg via ORAL
  Filled 2015-10-06: qty 1

## 2015-10-06 MED ORDER — TRAMADOL HCL 50 MG PO TABS: 50.0000 mg | ORAL_TABLET | Freq: Two times a day (BID) | ORAL | Status: DC | PRN

## 2015-10-06 MED ORDER — LIDOCAINE HCL (PF) 1 % IJ SOLN
INTRAMUSCULAR | Status: AC
  Administered 2015-10-06: 5 mL via INTRADERMAL
  Filled 2015-10-06: qty 5

## 2015-10-06 MED ORDER — BACITRACIN ZINC 500 UNIT/GM EX OINT
TOPICAL_OINTMENT | Freq: Once | CUTANEOUS | Status: AC
  Administered 2015-10-06: 1 via TOPICAL

## 2015-10-06 MED ORDER — LIDOCAINE HCL (PF) 1 % IJ SOLN
5.0000 mL | Freq: Once | INTRAMUSCULAR | Status: AC
  Administered 2015-10-06: 5 mL via INTRADERMAL

## 2015-10-06 NOTE — ED Provider Notes (Signed)
[facility] Emergency Department Provider Note   ____________________________________________  Time seen: Approximately 9:28 PM  I have reviewed the triage vital signs and the nursing notes.   HISTORY  Chief Complaint Finger Injury    HPI Felicia Poole is a 70 y.o. female patient complaining of pain and laceration second digit left hand. Patient's stay hand was slammed in a door. Patient denies loss sensation or loss of function. Hemorrhaging is controlled at this time. Patient advised she is taking Coumadin but has not taken her nighttime dosage today. Patient rates the pain as a 1/10. No palliative measures taken for this complaint.   Past Medical History  Diagnosis Date  . Postsurgical percutaneous transluminal coronary angioplasty status   . Type II or unspecified type diabetes mellitus without mention of complication, not stated as uncontrolled   . Other and unspecified hyperlipidemia   . Osteoporosis, unspecified   . Lumbago   . Unspecified hypothyroidism   . Contusion of unspecified site   . Unspecified menopausal and postmenopausal disorder   . Cramp of limb   . Heartburn   . Contact dermatitis and other eczema, due to unspecified cause   . Other specified cardiac dysrhythmias(427.89)   . Benign neoplasm of heart   . Essential hypertension, benign   . Atrial myxoma     Status post resection in 2012 at [facility]  . Coronary atherosclerosis of unspecified type of vessel, native or graft     Status post CABG in March of 2012 at the time of atrial myxoma resection with LIMA to LAD done at [facility]  . Nonischemic cardiomyopathy ([place])     EF: 45% in 2012  . Nonischemic cardiomyopathy [facility])     Patient Active Problem List   Diagnosis Date Noted  . Diabetes mellitus ([place]) 05/08/2015  . 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 Easy bruisability 04/29/2015  . Chronic anticoagulation 04/29/2015  . Adiposity 12/10/2014  . Obesity  12/10/2014  . Accumulation of fluid in tissues 10/15/2013  . Hypercholesterolemia 10/15/2013  . Encounter for therapeutic drug monitoring 08/08/2013  . Pacemaker -Medtronic 05/24/2013  . Tachy-brady syndrome ([place]) 03/02/2013  . Nonischemic cardiomyopathy ([place])   . Atrial fibrillation ([place]) 02/28/2013  . Other malaise 02/26/2013  . Arteriosclerosis of coronary artery 02/22/2013  . BP (high blood pressure) 02/22/2013  . Adult hypothyroidism 02/22/2013  . Congestive heart failure with left ventricular systolic dysfunction ([place]) 02/22/2013  . Type 2 diabetes mellitus ([place]) 02/22/2013  . Tachycardia-bradycardia ([place]) 02/22/2013  . Essential hypertension, benign   . Atrial myxoma   . Coronary atherosclerosis     Past Surgical History  Procedure Laterality Date  . Atrial myxoma excision Left   . Vaginal hysterectomy    . Coronary artery bypass graft  2011    [facility]  . Cardiac catheterization  2012    [facility]; right and left heart 75% ostial LAD lesion  . Insert / replace / remove pacemaker  02/2013    Dual chamber MDT advisa pacemaker. [id number]    Current Outpatient Rx  Name  Route  Sig  Dispense  Refill  . amLODipine (NORVASC) 10 MG tablet   Oral   Take 1 tablet (10 mg total) by mouth daily.   90 tablet   0   . atorvastatin (LIPITOR) 80 MG tablet   Oral   Take 80 mg by mouth daily.         . chlorthalidone (HYGROTON) 25 MG tablet   Oral  Take 25 mg by mouth daily.         . fluticasone (FLONASE) 50 MCG/ACT nasal spray      SPRAY 1 SPRAY IN EACH NOSTRIL 2 TIMES A DAY   16 g   0   . furosemide (LASIX) 40 MG tablet   Oral   Take 40 mg by mouth daily as needed.          Marland Kitchen levothyroxine (SYNTHROID, LEVOTHROID) 50 MCG tablet   Oral   Take 50 mcg by mouth daily before breakfast.         . levothyroxine (SYNTHROID, LEVOTHROID) 75 MCG tablet   Oral   Take 1 tablet (75 mcg total) by mouth daily.   90 tablet   0   . levothyroxine (SYNTHROID, LEVOTHROID) 75 MCG  tablet      TAKE 1 TABLET BY MOUTH EVERY DAY   30 tablet   7   . lisinopril (PRINIVIL,ZESTRIL) 20 MG tablet      TAKE 1 TABLET (20 MG TOTAL) BY MOUTH DAILY.   90 tablet   0   . ranitidine (ZANTAC) 300 MG tablet   Oral   Take 300 mg by mouth at bedtime.         . sotalol (BETAPACE) 80 MG tablet      TAKE 1 TABLET BY MOUTH EVERY 12 HOURS   180 tablet   3   . terbinafine (LAMISIL AT) 1 % cream   Topical   Apply 1 application topically 2 (two) times daily.   30 g   0   . traMADol (ULTRAM) 50 MG tablet   Oral   Take 1 tablet (50 mg total) by mouth every 12 (twelve) hours as needed for moderate pain.   10 tablet   0   . warfarin (COUMADIN) 2.5 MG tablet      Take 1.5 - 2 tablets daily as directed by [facility]   60 tablet   3     Allergies Review of patient's allergies indicates no known allergies.  Family History  Problem Relation Age of Onset  . Heart failure Mother     Social History Social History  Substance Use Topics  . Smoking status: Never Smoker   . Smokeless tobacco: None  . Alcohol Use: No    Review of Systems Constitutional: No fever/chills Eyes: No visual changes. ENT: No sore throat. Cardiovascular: Denies chest pain. Respiratory: Denies shortness of breath. Gastrointestinal: No abdominal pain.  No nausea, no vomiting.  No diarrhea.  No constipation. Genitourinary: Negative for dysuria. Musculoskeletal: Pain and edema left index finger Skin: Negative for rash. Laceration left index finger Neurological: Negative for headaches, focal weakness or numbness. Endocrine:Hypertension hyperlipidemia ____________________________________________   PHYSICAL EXAM:  VITAL SIGNS: ED Triage Vitals  Enc Vitals Group     BP 10/06/15 2113 115/76 mmHg     Pulse Rate 10/06/15 2113 73     Resp 10/06/15 2113 18     Temp 10/06/15 2113 97.9 F (36.6 C)     Temp Source 10/06/15 2113 Oral     SpO2 10/06/15 2113 98 %     Weight 10/06/15 2113  146 lb (66.225 kg)     Height 10/06/15 2113 4\' 7"  (1.397 m)     Head Cir --      Peak Flow --      Pain Score 10/06/15 2115 1     Pain Loc --      Pain Edu? --  Excl. in [place]? --     Constitutional: Alert and oriented. Well appearing and in no acute distress. Eyes: Conjunctivae are normal. PERRL. EOMI. Head: Atraumatic. Nose: No congestion/rhinnorhea. Mouth/Throat: Mucous membranes are moist.  Oropharynx non-erythematous. Neck: No stridor.  No cervical spine tenderness to palpation. Hematological/Lymphatic/Immunilogical: No cervical lymphadenopathy. Cardiovascular: Normal rate, regular rhythm. Grossly normal heart sounds.  Good peripheral circulation. Respiratory: Normal respiratory effort.  No retractions. Lungs CTAB. Gastrointestinal: Soft and nontender. No distention. No abdominal bruits. No CVA tenderness. Musculoskeletal: No obvious deformity to the left index finger. Full and equal range of motion. Neurologic:  Normal speech and language. No gross focal neurologic deficits are appreciated. No gait instability. Skin:  Skin is warm, dry and intact. No rash noted. 2.5 cm laceration palmar aspect of the second digit left hand Psychiatric: Mood and affect are normal. Speech and behavior are normal.  ____________________________________________   LABS (all labs ordered are listed, but only abnormal results are displayed)  Labs Reviewed - No data to display ____________________________________________  EKG   ____________________________________________  RADIOLOGY  No acute findings on x-ray of the second digit left hand. ____________________________________________   PROCEDURES  Procedure(s) performed: LACERATION REPAIR Performed by: Sable Feil Authorized by: Sable Feil Consent: Verbal consent obtained. Risks and benefits: risks, benefits and alternatives were discussed Consent given by: patient Patient identity confirmed: provided demographic  data Prepped and Draped in normal sterile fashion Wound explored  Laceration Location: Second digit left hand  Laceration Length: 2.5cm  No Foreign Bodies seen or palpated  Anesthesia: Digital block  Local anesthetic: Lidocaine 1% without epinephrine  Anesthetic total: 5 ml  Irrigation method: syringe Amount of cleaning: standard  Skin closure: 4-0 Prolene Number of sutures: 6   Technique: Interrupted   Patient tolerance: Patient tolerated the procedure well with no immediate complications.   Critical Care performed: No  ____________________________________________   INITIAL IMPRESSION / ASSESSMENT AND PLAN / ED COURSE  Pertinent labs & imaging results that were available during my care of the patient were reviewed by me and considered in my medical decision making (see chart for details).  Aspiration contusion second digit left hand. Patient given discharge care instructions for laceration care. Patient advised to have sutures removed in 10 days. Patient given prescription for tramadol to take as needed for pain. Return by ER for this wound reopens. Patient advised to wear splint for 2-3 days. ____________________________________________   FINAL CLINICAL IMPRESSION(S) / ED DIAGNOSES  Final diagnoses:  Finger laceration, initial encounter  Finger contusion, initial encounter      NEW MEDICATIONS STARTED DURING THIS VISIT:  New Prescriptions   TRAMADOL (ULTRAM) 50 MG TABLET    Take 1 tablet (50 mg total) by mouth every 12 (twelve) hours as needed for moderate pain.     Note:  This document was prepared using Dragon voice recognition software and may include unintentional dictation errors.    Sable Feil, PA-C 10/06/15 2217  Nena Polio, MD 10/07/15 [phone]

## 2015-10-06 NOTE — ED Notes (Signed)
Pt arrived to the ED for a laceration to the second digit of the left hand. Pt is AOx4 in no apparent distress. No bleeding at this time.

## 2015-10-06 NOTE — Discharge Instructions (Signed)
Laceration Care, Adult A laceration is a cut that goes through all of the layers of the skin and into the tissue that is right under the skin. Some lacerations heal on their own. Others need to be closed with stitches (sutures), staples, skin adhesive strips, or skin glue. Proper laceration care minimizes the risk of infection and helps the laceration to heal better. HOW TO CARE FOR YOUR LACERATION If sutures or staples were used:  Keep the wound clean and dry.  If you were given a bandage (dressing), you should change it at least one time per day or as told by your health care provider. You should also change it if it becomes wet or dirty.  Keep the wound completely dry for the first 24 hours or as told by your health care provider. After that time, you may shower or bathe. However, make sure that the wound is not soaked in water until after the sutures or staples have been removed.  Clean the wound one time each day or as told by your health care provider:  Wash the wound with soap and water.  Rinse the wound with water to remove all soap.  Pat the wound dry with a clean towel. Do not rub the wound.  After cleaning the wound, apply a thin layer of antibiotic ointmentas told by your health care provider. This will help to prevent infection and keep the dressing from sticking to the wound.  Have the sutures or staples removed as told by your health care provider. If skin adhesive strips were used:  Keep the wound clean and dry.  If you were given a bandage (dressing), you should change it at least one time per day or as told by your health care provider. You should also change it if it becomes dirty or wet.  Do not get the skin adhesive strips wet. You may shower or bathe, but be careful to keep the wound dry.  If the wound gets wet, pat it dry with a clean towel. Do not rub the wound.  Skin adhesive strips fall off on their own. You may trim the strips as the wound heals. Do not  remove skin adhesive strips that are still stuck to the wound. They will fall off in time. If skin glue was used:  Try to keep the wound dry, but you may briefly wet it in the shower or bath. Do not soak the wound in water, such as by swimming.  After you have showered or bathed, gently pat the wound dry with a clean towel. Do not rub the wound.  Do not do any activities that will make you sweat heavily until the skin glue has fallen off on its own.  Do not apply liquid, cream, or ointment medicine to the wound while the skin glue is in place. Using those may loosen the film before the wound has healed.  If you were given a bandage (dressing), you should change it at least one time per day or as told by your health care provider. You should also change it if it becomes dirty or wet.  If a dressing is placed over the wound, be careful not to apply tape directly over the skin glue. Doing that may cause the glue to be pulled off before the wound has healed.  Do not pick at the glue. The skin glue usually remains in place for 5-10 days, then it falls off of the skin. General Instructions  Take over-the-counter and prescription  medicines only as told by your health care provider.  If you were prescribed an antibiotic medicine or ointment, take or apply it as told by your doctor. Do not stop using it even if your condition improves.  To help prevent scarring, make sure to cover your wound with sunscreen whenever you are outside after stitches are removed, after adhesive strips are removed, or when glue remains in place and the wound is healed. Make sure to wear a sunscreen of at least 30 SPF.  Do not scratch or pick at the wound.  Keep all follow-up visits as told by your health care provider. This is important.  Check your wound every day for signs of infection. Watch for:  Redness, swelling, or pain.  Fluid, blood, or pus.  Raise (elevate) the injured area above the level of your heart  while you are sitting or lying down, if possible. SEEK MEDICAL CARE IF:  You received a tetanus shot and you have swelling, severe pain, redness, or bleeding at the injection site.  You have a fever.  A wound that was closed breaks open.  You notice a bad smell coming from your wound or your dressing.  You notice something coming out of the wound, such as wood or glass.  Your pain is not controlled with medicine.  You have increased redness, swelling, or pain at the site of your wound.  You have fluid, blood, or pus coming from your wound.  You notice a change in the color of your skin near your wound.  You need to change the dressing frequently due to fluid, blood, or pus draining from the wound.  You develop a new rash.  You develop numbness around the wound. SEEK IMMEDIATE MEDICAL CARE IF:  You develop severe swelling around the wound.  Your pain suddenly increases and is severe.  You develop painful lumps near the wound or on skin that is anywhere on your body.  You have a red streak going away from your wound.  The wound is on your hand or foot and you cannot properly move a finger or toe.  The wound is on your hand or foot and you notice that your fingers or toes look pale or bluish.   This information is not intended to replace advice given to you by your health care provider. Make sure you discuss any questions you have with your health care provider.   Document Released: 05/24/2005 Document Revised: 10/08/2014 Document Reviewed: 05/20/2014 Elsevier Interactive Patient Education 2016 [place] A contusion is a deep bruise. Contusions happen when an injury causes bleeding under the skin. Symptoms of bruising include pain, swelling, and discolored skin. The skin may turn blue, purple, or yellow. HOME CARE: Wear splint for 2-3 days as needed.  Rest the injured area.  If told, put ice on the injured area.  Put ice in a plastic bag.  Place a  towel between your skin and the bag.  Leave the ice on for 20 minutes, 2-3 times per day.  If told, put light pressure (compression) on the injured area using an elastic bandage. Make sure the bandage is not too tight. Remove it and put it back on as told by your doctor.  If possible, raise (elevate) the injured area above the level of your heart while you are sitting or lying down.  Take over-the-counter and prescription medicines only as told by your doctor. GET HELP IF:  Your symptoms do not get better after several  days of treatment.  Your symptoms get worse.  You have trouble moving the injured area. GET HELP RIGHT AWAY IF:   You have very bad pain.  You have a loss of feeling (numbness) in a hand or foot.  Your hand or foot turns pale or cold.   This information is not intended to replace advice given to you by your health care provider. Make sure you discuss any questions you have with your health care provider.   Document Released: 11/10/2007 Document Revised: 02/12/2015 Document Reviewed: 10/09/2014 Elsevier Interactive Patient Education [employer].

## 2015-10-19 ENCOUNTER — Encounter: Payer: Self-pay | Admitting: Emergency Medicine

## 2015-10-19 ENCOUNTER — Emergency Department
Admission: EM | Admit: 2015-10-19 | Discharge: 2015-10-19 | Disposition: A | Discharge status: Home / Self Care | Payer: Medicare Other | Attending: Emergency Medicine | Admitting: Emergency Medicine

## 2015-10-19 DIAGNOSIS — E039 Hypothyroidism, unspecified: Secondary | ICD-10-CM | POA: Insufficient documentation

## 2015-10-19 DIAGNOSIS — M810 Age-related osteoporosis without current pathological fracture: Secondary | ICD-10-CM | POA: Diagnosis not present

## 2015-10-19 DIAGNOSIS — Z4802 Encounter for removal of sutures: Secondary | ICD-10-CM | POA: Insufficient documentation

## 2015-10-19 DIAGNOSIS — I110 Hypertensive heart disease with heart failure: Secondary | ICD-10-CM | POA: Insufficient documentation

## 2015-10-19 DIAGNOSIS — Z950 Presence of cardiac pacemaker: Secondary | ICD-10-CM | POA: Diagnosis not present

## 2015-10-19 DIAGNOSIS — E669 Obesity, unspecified: Secondary | ICD-10-CM | POA: Diagnosis not present

## 2015-10-19 DIAGNOSIS — I2510 Atherosclerotic heart disease of native coronary artery without angina pectoris: Secondary | ICD-10-CM | POA: Insufficient documentation

## 2015-10-19 DIAGNOSIS — Z7901 Long term (current) use of anticoagulants: Secondary | ICD-10-CM | POA: Insufficient documentation

## 2015-10-19 DIAGNOSIS — I509 Heart failure, unspecified: Secondary | ICD-10-CM | POA: Insufficient documentation

## 2015-10-19 DIAGNOSIS — I429 Cardiomyopathy, unspecified: Secondary | ICD-10-CM | POA: Diagnosis not present

## 2015-10-19 DIAGNOSIS — I4891 Unspecified atrial fibrillation: Secondary | ICD-10-CM | POA: Insufficient documentation

## 2015-10-19 DIAGNOSIS — E785 Hyperlipidemia, unspecified: Secondary | ICD-10-CM | POA: Insufficient documentation

## 2015-10-19 DIAGNOSIS — Z79899 Other long term (current) drug therapy: Secondary | ICD-10-CM | POA: Diagnosis not present

## 2015-10-19 DIAGNOSIS — E1129 Type 2 diabetes mellitus with other diabetic kidney complication: Secondary | ICD-10-CM | POA: Diagnosis not present

## 2015-10-19 NOTE — Discharge Instructions (Signed)

## 2015-10-19 NOTE — ED Provider Notes (Signed)
[facility] Emergency Department Provider Note  ____________________________________________  Time seen: Approximately 4:46 PM  I have reviewed the triage vital signs and the nursing notes.   HISTORY  Chief Complaint Suture / Staple Removal   HPI Felicia Poole is a 70 y.o. female is here for suture removal from her left index finger. Patient states that she was not having any problems and has not seen any redness from the area. Sutures were placed on 10/06/15.She denies any pain at this time.   Past Medical History  Diagnosis Date  . Postsurgical percutaneous transluminal coronary angioplasty status   . Type II or unspecified type diabetes mellitus without mention of complication, not stated as uncontrolled   . Other and unspecified hyperlipidemia   . Osteoporosis, unspecified   . Lumbago   . Unspecified hypothyroidism   . Contusion of unspecified site   . Unspecified menopausal and postmenopausal disorder   . Cramp of limb   . Heartburn   . Contact dermatitis and other eczema, due to unspecified cause   . Other specified cardiac dysrhythmias(427.89)   . Benign neoplasm of heart   . Essential hypertension, benign   . Atrial myxoma     Status post resection in 2012 at [facility]  . Coronary atherosclerosis of unspecified type of vessel, native or graft     Status post CABG in March of 2012 at the time of atrial myxoma resection with LIMA to LAD done at [facility]  . Nonischemic cardiomyopathy ([place])     EF: 45% in 2012  . Nonischemic cardiomyopathy [facility])     Patient Active Problem List   Diagnosis Date Noted  . Diabetes mellitus ([place]) 05/08/2015  . Diabetes mellitus with microalbuminuria ([place]) 04/29/2015  . Easy bruisability 04/29/2015  . Chronic anticoagulation 04/29/2015  . Adiposity 12/10/2014  . Obesity 12/10/2014  . Accumulation of fluid in tissues 10/15/2013  . Hypercholesterolemia 10/15/2013  . Encounter for therapeutic drug monitoring  08/08/2013  . Pacemaker -Medtronic 05/24/2013  . Tachy-brady syndrome ([place]) 03/02/2013  . Nonischemic cardiomyopathy ([place])   . Atrial fibrillation ([place]) 02/28/2013  . Other malaise 02/26/2013  . Arteriosclerosis of coronary artery 02/22/2013  . BP (high blood pressure) 02/22/2013  . Adult hypothyroidism 02/22/2013  . Congestive heart failure with left ventricular systolic dysfunction ([place]) 02/22/2013  . Type 2 diabetes mellitus ([place]) 02/22/2013  . Tachycardia-bradycardia ([place]) 02/22/2013  . Essential hypertension, benign   . Atrial myxoma   . Coronary atherosclerosis     Past Surgical History  Procedure Laterality Date  . Atrial myxoma excision Left   . Vaginal hysterectomy    . Coronary artery bypass graft  2011    [facility]  . Cardiac catheterization  2012    [facility]; right and left heart 75% ostial LAD lesion  . Insert / replace / remove pacemaker  02/2013    Dual chamber MDT advisa pacemaker. [id number]    Current Outpatient Rx  Name  Route  Sig  Dispense  Refill  . amLODipine (NORVASC) 10 MG tablet   Oral   Take 1 tablet (10 mg total) by mouth daily.   90 tablet   0   . atorvastatin (LIPITOR) 80 MG tablet   Oral   Take 80 mg by mouth daily.         . chlorthalidone (HYGROTON) 25 MG tablet   Oral   Take 25 mg by mouth daily.         . fluticasone (FLONASE) 50 MCG/ACT nasal spray  SPRAY 1 SPRAY IN EACH NOSTRIL 2 TIMES A DAY   16 g   0   . furosemide (LASIX) 40 MG tablet   Oral   Take 40 mg by mouth daily as needed.          Marland Kitchen levothyroxine (SYNTHROID, LEVOTHROID) 50 MCG tablet   Oral   Take 50 mcg by mouth daily before breakfast.         . levothyroxine (SYNTHROID, LEVOTHROID) 75 MCG tablet   Oral   Take 1 tablet (75 mcg total) by mouth daily.   90 tablet   0   . levothyroxine (SYNTHROID, LEVOTHROID) 75 MCG tablet      TAKE 1 TABLET BY MOUTH EVERY DAY   30 tablet   7   . lisinopril (PRINIVIL,ZESTRIL) 20 MG tablet      TAKE 1 TABLET (20 MG  TOTAL) BY MOUTH DAILY.   90 tablet   0   . ranitidine (ZANTAC) 300 MG tablet   Oral   Take 300 mg by mouth at bedtime.         . sotalol (BETAPACE) 80 MG tablet      TAKE 1 TABLET BY MOUTH EVERY 12 HOURS   180 tablet   3   . terbinafine (LAMISIL AT) 1 % cream   Topical   Apply 1 application topically 2 (two) times daily.   30 g   0   . traMADol (ULTRAM) 50 MG tablet   Oral   Take 1 tablet (50 mg total) by mouth every 12 (twelve) hours as needed for moderate pain.   10 tablet   0   . warfarin (COUMADIN) 2.5 MG tablet      Take 1.5 - 2 tablets daily as directed by Coumadin Clinic   60 tablet   3     Allergies Review of patient's allergies indicates no known allergies.  Family History  Problem Relation Age of Onset  . Heart failure Mother     Social History Social History  Substance Use Topics  . Smoking status: Never Smoker   . Smokeless tobacco: None  . Alcohol Use: No    Review of Systems Constitutional: No fever/chills Cardiovascular: Denies chest pain. Respiratory: Denies shortness of breath. Musculoskeletal: She denies any left hand pain. Skin: Negative for rash. She denies any signs of infection at the laceration site. Neurological: Negative for headaches, focal weakness or numbness.  10-point ROS otherwise negative.  ____________________________________________   PHYSICAL EXAM:  VITAL SIGNS: ED Triage Vitals  Enc Vitals Group     BP --      Pulse --      Resp --      Temp --      Temp src --      SpO2 --      Weight --      Height --      Head Cir --      Peak Flow --      Pain Score --      Pain Loc --      Pain Edu? --      Excl. in [place]? --     Constitutional: Alert and oriented. Well appearing and in no acute distress. Eyes: Conjunctivae are normal. PERRL. EOMI. Head: Atraumatic. Nose: No congestion/rhinnorhea. Neck: No stridor.   Cardiovascular:  Good peripheral circulation. Respiratory: Normal respiratory effort.  No  retractions. Lungs CTAB. Musculoskeletal: No lower extremity tenderness nor edema.  No joint effusions. Neurologic:  Normal speech and language. No gross focal neurologic deficits are appreciated. No gait instability. Skin:  Skin is warm, dry. Area appears to be well healed without any signs of infection. Psychiatric: Mood and affect are normal. Speech and behavior are normal.  ____________________________________________   LABS (all labs ordered are listed, but only abnormal results are displayed)  Labs Reviewed - No data to display  PROCEDURES  Procedure(s) performed: None  Critical Care performed: No  ____________________________________________   INITIAL IMPRESSION / ASSESSMENT AND PLAN / ED COURSE  Pertinent labs & imaging results that were available during my care of the patient were reviewed by me and considered in my medical decision making (see chart for details).  Sutures were removed by RN. Patient is follow-up with primary care doctor if any continued problems. ____________________________________________   FINAL CLINICAL IMPRESSION(S) / ED DIAGNOSES  Final diagnoses:  Encounter for removal of sutures      NEW MEDICATIONS STARTED DURING THIS VISIT:  Discharge Medication List as of 10/19/2015  4:47 PM       Note:  This document was prepared using Dragon voice recognition software and may include unintentional dictation errors.    Johnn Hai, PA-C 10/19/15 1749  Nance Pear, MD 10/20/15 [id number]

## 2015-10-19 NOTE — ED Notes (Signed)
Pt comes into the ED via POV to have sutures removed from her left index finger.

## 2015-10-22 ENCOUNTER — Ambulatory Visit (INDEPENDENT_AMBULATORY_CARE_PROVIDER_SITE_OTHER): Payer: Medicare Other

## 2015-10-22 DIAGNOSIS — I482 Chronic atrial fibrillation, unspecified: Secondary | ICD-10-CM

## 2015-10-22 DIAGNOSIS — Z5181 Encounter for therapeutic drug level monitoring: Secondary | ICD-10-CM | POA: Diagnosis not present

## 2015-10-22 DIAGNOSIS — I4891 Unspecified atrial fibrillation: Secondary | ICD-10-CM | POA: Diagnosis not present

## 2015-10-22 LAB — POCT INR: INR: 1.6

## 2015-11-21 ENCOUNTER — Ambulatory Visit (INDEPENDENT_AMBULATORY_CARE_PROVIDER_SITE_OTHER): Payer: Medicare Other

## 2015-11-21 DIAGNOSIS — I482 Chronic atrial fibrillation, unspecified: Secondary | ICD-10-CM

## 2015-11-21 DIAGNOSIS — I4891 Unspecified atrial fibrillation: Secondary | ICD-10-CM

## 2015-11-21 DIAGNOSIS — Z5181 Encounter for therapeutic drug level monitoring: Secondary | ICD-10-CM | POA: Diagnosis not present

## 2015-11-21 LAB — POCT INR: INR: 1.5

## 2015-12-08 ENCOUNTER — Other Ambulatory Visit: Payer: Self-pay | Admitting: Family Medicine

## 2015-12-09 ENCOUNTER — Other Ambulatory Visit: Payer: Self-pay | Admitting: Family Medicine

## 2015-12-16 NOTE — Telephone Encounter (Signed)
Last visit I see here is 05/08/15. Would like refill

## 2015-12-24 ENCOUNTER — Ambulatory Visit (INDEPENDENT_AMBULATORY_CARE_PROVIDER_SITE_OTHER): Payer: Medicare Other

## 2015-12-24 DIAGNOSIS — I4891 Unspecified atrial fibrillation: Secondary | ICD-10-CM | POA: Diagnosis not present

## 2015-12-24 DIAGNOSIS — Z5181 Encounter for therapeutic drug level monitoring: Secondary | ICD-10-CM

## 2015-12-24 DIAGNOSIS — I482 Chronic atrial fibrillation, unspecified: Secondary | ICD-10-CM

## 2015-12-24 LAB — POCT INR: INR: 1.3

## 2016-01-14 ENCOUNTER — Ambulatory Visit (INDEPENDENT_AMBULATORY_CARE_PROVIDER_SITE_OTHER): Payer: Medicare Other

## 2016-01-14 DIAGNOSIS — I4891 Unspecified atrial fibrillation: Secondary | ICD-10-CM

## 2016-01-14 DIAGNOSIS — Z5181 Encounter for therapeutic drug level monitoring: Secondary | ICD-10-CM

## 2016-01-14 LAB — POCT INR: INR: 1

## 2016-01-14 MED ORDER — WARFARIN SODIUM 2.5 MG PO TABS: ORAL_TABLET | ORAL | 3 refills | Status: DC

## 2016-01-28 ENCOUNTER — Ambulatory Visit (INDEPENDENT_AMBULATORY_CARE_PROVIDER_SITE_OTHER): Payer: Medicare Other

## 2016-01-28 DIAGNOSIS — Z5181 Encounter for therapeutic drug level monitoring: Secondary | ICD-10-CM

## 2016-01-28 DIAGNOSIS — I4891 Unspecified atrial fibrillation: Secondary | ICD-10-CM | POA: Diagnosis not present

## 2016-01-28 LAB — POCT INR: INR: 1.7

## 2016-02-18 ENCOUNTER — Ambulatory Visit (INDEPENDENT_AMBULATORY_CARE_PROVIDER_SITE_OTHER): Payer: Medicare Other | Admitting: *Deleted

## 2016-02-18 DIAGNOSIS — Z5181 Encounter for therapeutic drug level monitoring: Secondary | ICD-10-CM | POA: Diagnosis not present

## 2016-02-18 DIAGNOSIS — I4891 Unspecified atrial fibrillation: Secondary | ICD-10-CM | POA: Diagnosis not present

## 2016-02-18 LAB — POCT INR: INR: 1.4

## 2016-02-25 ENCOUNTER — Ambulatory Visit (INDEPENDENT_AMBULATORY_CARE_PROVIDER_SITE_OTHER): Payer: Medicare Other | Admitting: *Deleted

## 2016-02-25 DIAGNOSIS — I4891 Unspecified atrial fibrillation: Secondary | ICD-10-CM | POA: Diagnosis not present

## 2016-02-25 DIAGNOSIS — Z5181 Encounter for therapeutic drug level monitoring: Secondary | ICD-10-CM

## 2016-02-25 LAB — POCT INR: INR: 1.4

## 2016-03-03 ENCOUNTER — Ambulatory Visit (INDEPENDENT_AMBULATORY_CARE_PROVIDER_SITE_OTHER): Payer: Medicare Other

## 2016-03-03 DIAGNOSIS — Z5181 Encounter for therapeutic drug level monitoring: Secondary | ICD-10-CM

## 2016-03-03 DIAGNOSIS — I4891 Unspecified atrial fibrillation: Secondary | ICD-10-CM

## 2016-03-03 LAB — POCT INR: INR: 1.3

## 2016-03-10 ENCOUNTER — Ambulatory Visit (INDEPENDENT_AMBULATORY_CARE_PROVIDER_SITE_OTHER): Payer: Medicare Other

## 2016-03-10 DIAGNOSIS — I4891 Unspecified atrial fibrillation: Secondary | ICD-10-CM

## 2016-03-10 DIAGNOSIS — Z5181 Encounter for therapeutic drug level monitoring: Secondary | ICD-10-CM

## 2016-03-10 LAB — POCT INR: INR: 1.1

## 2016-03-17 ENCOUNTER — Ambulatory Visit (INDEPENDENT_AMBULATORY_CARE_PROVIDER_SITE_OTHER): Payer: Medicare Other

## 2016-03-17 DIAGNOSIS — I4891 Unspecified atrial fibrillation: Secondary | ICD-10-CM

## 2016-03-17 DIAGNOSIS — Z5181 Encounter for therapeutic drug level monitoring: Secondary | ICD-10-CM | POA: Diagnosis not present

## 2016-03-17 LAB — POCT INR: INR: 1.4

## 2016-03-22 ENCOUNTER — Telehealth: Payer: Self-pay | Admitting: Family Medicine

## 2016-03-22 NOTE — Telephone Encounter (Signed)
Called Pt to schedule AWV with [facility] and CPE with Manuella Ghazi for 10/23-knb

## 2016-03-24 ENCOUNTER — Ambulatory Visit (INDEPENDENT_AMBULATORY_CARE_PROVIDER_SITE_OTHER): Payer: Medicare Other | Admitting: *Deleted

## 2016-03-24 DIAGNOSIS — Z5181 Encounter for therapeutic drug level monitoring: Secondary | ICD-10-CM

## 2016-03-24 DIAGNOSIS — I4891 Unspecified atrial fibrillation: Secondary | ICD-10-CM

## 2016-03-24 LAB — POCT INR: INR: 1.4

## 2016-03-31 ENCOUNTER — Ambulatory Visit (INDEPENDENT_AMBULATORY_CARE_PROVIDER_SITE_OTHER): Payer: Medicare Other

## 2016-03-31 DIAGNOSIS — I4891 Unspecified atrial fibrillation: Secondary | ICD-10-CM

## 2016-03-31 DIAGNOSIS — Z5181 Encounter for therapeutic drug level monitoring: Secondary | ICD-10-CM

## 2016-03-31 LAB — POCT INR: INR: 2.1

## 2016-04-06 ENCOUNTER — Ambulatory Visit (INDEPENDENT_AMBULATORY_CARE_PROVIDER_SITE_OTHER): Payer: Medicare Other | Admitting: Internal Medicine

## 2016-04-06 ENCOUNTER — Encounter: Payer: Self-pay | Admitting: Internal Medicine

## 2016-04-06 VITALS — BP 140/110 | HR 72 | Ht <= 58 in | Wt 155.5 lb

## 2016-04-06 DIAGNOSIS — Z79899 Other long term (current) drug therapy: Secondary | ICD-10-CM | POA: Diagnosis not present

## 2016-04-06 DIAGNOSIS — I480 Paroxysmal atrial fibrillation: Secondary | ICD-10-CM

## 2016-04-06 DIAGNOSIS — I495 Sick sinus syndrome: Secondary | ICD-10-CM

## 2016-04-06 DIAGNOSIS — Z950 Presence of cardiac pacemaker: Secondary | ICD-10-CM

## 2016-04-06 LAB — CUP PACEART INCLINIC DEVICE CHECK
Battery Remaining Longevity: 73 mo
Brady Statistic RA Percent Paced: 98.63 %
Brady Statistic RV Percent Paced: 0.04 %
Implantable Lead Implant Date: 20140919
Implantable Lead Implant Date: 20140919
Implantable Lead Location: 753860
Implantable Lead Model: 5086
Implantable Lead Model: 5086
Implantable Pulse Generator Implant Date: 20140919
Lead Channel Impedance Value: 418 Ohm
Lead Channel Impedance Value: 570 Ohm
Lead Channel Pacing Threshold Amplitude: 0.5 V
Lead Channel Pacing Threshold Pulse Width: 0.4 ms
Lead Channel Setting Pacing Amplitude: 2.5 V
Lead Channel Setting Sensing Sensitivity: 0.9 mV
MDC IDC LEAD LOCATION: 753859
MDC IDC MSMT BATTERY VOLTAGE: 3 V
MDC IDC MSMT LEADCHNL RA IMPEDANCE VALUE: 380 Ohm
MDC IDC MSMT LEADCHNL RV IMPEDANCE VALUE: 513 Ohm
MDC IDC MSMT LEADCHNL RV PACING THRESHOLD AMPLITUDE: 0.75 V
MDC IDC MSMT LEADCHNL RV PACING THRESHOLD PULSEWIDTH: 0.4 ms
MDC IDC MSMT LEADCHNL RV SENSING INTR AMPL: 12.875 mV
MDC IDC SESS DTM: 20171031161229
MDC IDC SET LEADCHNL RA PACING AMPLITUDE: 2 V
MDC IDC SET LEADCHNL RV PACING PULSEWIDTH: 0.4 ms
MDC IDC STAT BRADY AP VP PERCENT: 0.03 %
MDC IDC STAT BRADY AP VS PERCENT: 98.61 %
MDC IDC STAT BRADY AS VP PERCENT: 0 %
MDC IDC STAT BRADY AS VS PERCENT: 1.35 %

## 2016-04-06 NOTE — Patient Instructions (Addendum)
Medication Instructions: - Your physician recommends that you continue on your current medications as directed. Please refer to the Current Medication list given to you today.  Labwork: - Your physician recommends that you have lab work today: BMP/Magnesium  Procedures/Testing: - none ordered   Follow-Up: - Remote monitoring is used to monitor your Pacemaker of ICD from home. This monitoring reduces the number of office visits required to check your device to one time per year. It allows [place] to keep an eye on the functioning of your device to ensure it is working properly. You are scheduled for a device check from home on 07/06/16. You may send your transmission at any time that day. If you have a wireless device, the transmission will be sent automatically. After your physician reviews your transmission, you will receive a postcard with your next transmission date.  - Your physician wants you to follow-up in: 6 months with Dr. Caryl Comes. You will receive a reminder letter in the mail two months in advance. If you don't receive a letter, please call our office to schedule the follow-up appointment.  Any Additional Special Instructions Will Be Listed Below (If Applicable).     If you need a refill on your cardiac medications before your next appointment, please call your pharmacy.

## 2016-04-06 NOTE — Progress Notes (Signed)
Patient Care Team: Ashok Norris, MD as PCP - General (Unknown Physician Specialty)   HPI  Felicia Poole is a 70 y.o. female Seen for pacemaker followup for atrial fibrillation and significant post termination pauses. She then given an event recorder by Dr. Fletcher Anon which demonstrate pauses were evident;  she and her daughter chose to go to [facility] where pacemaker insertion was accomplished.     She denies chest pain   No notable edema or palpitations.  She does have exertiional Dypsnea--stable   She has a history of a prior atrial myxoma status post resection with one-vessel CABG at [facility] in 2012. She is a history of a nonischemic cardiomyopathy with ejection fraction 45% and a nuclear test March 2014 by Dr. Clayborn Bigness that apparently showed no ischemia and normal left ventricular function.  11/16 Cr 0.88 K  4.5  Past Medical History:  Diagnosis Date  . Atrial myxoma    Status post resection in 2012 at [facility]  . Benign neoplasm of heart   . Contact dermatitis and other eczema, due to unspecified cause   . Contusion of unspecified site   . Coronary atherosclerosis of unspecified type of vessel, native or graft    Status post CABG in March of 2012 at the time of atrial myxoma resection with LIMA to LAD done at [facility]  . Cramp of limb   . Essential hypertension, benign   . Heartburn   . Lumbago   . Nonischemic cardiomyopathy ([place])    EF: 45% in 2012  . Nonischemic cardiomyopathy ([place])   . Osteoporosis, unspecified   . Other and unspecified hyperlipidemia   . Other specified cardiac dysrhythmias(427.89)   . Postsurgical percutaneous transluminal coronary angioplasty status   . Type II or unspecified type diabetes mellitus without mention of complication, not stated as uncontrolled   . Unspecified hypothyroidism   . Unspecified menopausal and postmenopausal disorder     Past Surgical History:  Procedure Laterality Date  . ATRIAL MYXOMA EXCISION Left   . CARDIAC CATHETERIZATION   2012   [facility]; right and left heart 75% ostial LAD lesion  . CORONARY ARTERY BYPASS GRAFT  2011   [facility]  . INSERT / REPLACE / REMOVE PACEMAKER  02/2013   Dual chamber MDT advisa pacemaker. [id number]  . VAGINAL HYSTERECTOMY      Current Outpatient Prescriptions  Medication Sig Dispense Refill  . amLODipine (NORVASC) 10 MG tablet Take 1 tablet (10 mg total) by mouth daily. 90 tablet 0  . atorvastatin (LIPITOR) 80 MG tablet Take 80 mg by mouth daily.    . chlorthalidone (HYGROTON) 25 MG tablet Take 25 mg by mouth daily.    . fluticasone (FLONASE) 50 MCG/ACT nasal spray SPRAY 1 SPRAY IN EACH NOSTRIL 2 TIMES A DAY 16 g 0  . furosemide (LASIX) 40 MG tablet Take 40 mg by mouth daily as needed.     Marland Kitchen levothyroxine (SYNTHROID, LEVOTHROID) 75 MCG tablet Take 1 tablet (75 mcg total) by mouth daily. 90 tablet 0  . lisinopril (PRINIVIL,ZESTRIL) 20 MG tablet TAKE 1 TABLET (20 MG TOTAL) BY MOUTH DAILY. 90 tablet 0  . ranitidine (ZANTAC) 300 MG tablet Take 300 mg by mouth at bedtime.    . sotalol (BETAPACE) 80 MG tablet TAKE 1 TABLET BY MOUTH EVERY 12 HOURS 180 tablet 3  . terbinafine (LAMISIL AT) 1 % cream Apply 1 application topically 2 (two) times daily. 30 g 0  . traMADol (ULTRAM) 50 MG tablet Take 1 tablet (  50 mg total) by mouth every 12 (twelve) hours as needed for moderate pain. 10 tablet 0  . warfarin (COUMADIN) 2.5 MG tablet Take 1.5 - 2 tablets daily as directed by Coumadin Clinic 60 tablet 3   No current facility-administered medications for this visit.     No Known Allergies  Review of Systems negative except from HPI and PMH  Physical Exam BP (!) 140/110 (BP Location: Left Arm, Patient Position: Sitting, Cuff Size: Normal)   Pulse 72   Ht 4\' 7"  (1.397 m)   Wt 155 lb 8 oz (70.5 kg)   BMI 36.14 kg/m  Well developed and well nourished in no acute distress HENT normal E scleral and icterus clear Neck Supple JVP flat; carotids brisk and full Clear to ausculation Device pocket well  healed; without hematoma or erythema.  There is no tethering superficial collaterals Regular rate and rhythm, no murmurs gallops or rub Soft with active bowel sounds No clubbing cyanosis no  Edema Alert and oriented, grossly normal motor and sensory function Skin Warm and Dry   ECG11/15  demonstrates atrial pacing with a PR interval of 23/09/38 ECG repeated today demonstrates a QTC of 451 again with atrial pacing.     Assessment and  Plan  Paroxysmal atrial fibrillation with posttermination pausing  Sinus node dysfunction  100% apacing  High risk medication surveillance  HFpEF  Pacemaker The patient's device was interrogated and the information was fully reviewed.  The device was reprogrammed to  Decrease rate response to minimize her tachypalpitations  Atrial myxoma history of resection    No significant interval atrial fibrillation  No bleeding issues on her warfarin  We discussed the use of the NOACs compared to Coumadin. We briefly reviewed the data of at least comparability in stroke prevention, bleeding and outcome. We discussed some of the new once wherein somewhat associated with decreased ischemic stroke risk, one to be taken daily, and has been shown to be comparable and bleeding risk to aspirin.  We also discussed bleeding associated with warfarin as well as NOACs and a wall bleeding as a complication of all these drugs intracranial bleeding is more frequently associated with warfarin then the NOACs and a GI bleeding is more commonly associated with the latter  Will give  Her the NOAC list to see what she is most able to afford  We have reiterated proarrhythmia related to sotalol; we will betr blood work  We will plan to see her again in 6 months given her high risk medication  Euvolemic continue current meds

## 2016-04-14 ENCOUNTER — Ambulatory Visit (INDEPENDENT_AMBULATORY_CARE_PROVIDER_SITE_OTHER): Payer: Medicare Other

## 2016-04-14 ENCOUNTER — Other Ambulatory Visit (INDEPENDENT_AMBULATORY_CARE_PROVIDER_SITE_OTHER): Payer: Medicare Other | Admitting: *Deleted

## 2016-04-14 DIAGNOSIS — Z950 Presence of cardiac pacemaker: Secondary | ICD-10-CM | POA: Diagnosis not present

## 2016-04-14 DIAGNOSIS — I4891 Unspecified atrial fibrillation: Secondary | ICD-10-CM

## 2016-04-14 DIAGNOSIS — Z5181 Encounter for therapeutic drug level monitoring: Secondary | ICD-10-CM | POA: Diagnosis not present

## 2016-04-14 DIAGNOSIS — I495 Sick sinus syndrome: Secondary | ICD-10-CM | POA: Diagnosis not present

## 2016-04-14 DIAGNOSIS — Z79899 Other long term (current) drug therapy: Secondary | ICD-10-CM | POA: Diagnosis not present

## 2016-04-14 DIAGNOSIS — I480 Paroxysmal atrial fibrillation: Secondary | ICD-10-CM

## 2016-04-14 LAB — POCT INR: INR: 1.2

## 2016-04-15 LAB — BASIC METABOLIC PANEL
BUN / CREAT RATIO: 20 (ref 12–28)
BUN: 17 mg/dL (ref 8–27)
CHLORIDE: 101 mmol/L (ref 96–106)
CO2: 25 mmol/L (ref 18–29)
Calcium: 9.1 mg/dL (ref 8.7–10.3)
Creatinine, Ser: 0.84 mg/dL (ref 0.57–1.00)
GFR calc non Af Amer: 70 mL/min/{1.73_m2} (ref >59)
GFR, EST AFRICAN AMERICAN: 81 mL/min/{1.73_m2} (ref >59)
Glucose: 65 mg/dL (ref 65–99)
POTASSIUM: 4.5 mmol/L (ref 3.5–5.2)
SODIUM: 143 mmol/L (ref 134–144)

## 2016-04-15 LAB — MAGNESIUM: MAGNESIUM: 2 mg/dL (ref 1.6–2.3)

## 2016-04-19 NOTE — Addendum Note (Signed)
Addended by: Anselm Pancoast on: 04/19/2016 08:44 AM   Modules accepted: Orders

## 2016-04-21 ENCOUNTER — Ambulatory Visit (INDEPENDENT_AMBULATORY_CARE_PROVIDER_SITE_OTHER): Payer: Medicare Other

## 2016-04-21 DIAGNOSIS — Z5181 Encounter for therapeutic drug level monitoring: Secondary | ICD-10-CM | POA: Diagnosis not present

## 2016-04-21 DIAGNOSIS — I4891 Unspecified atrial fibrillation: Secondary | ICD-10-CM | POA: Diagnosis not present

## 2016-04-21 LAB — POCT INR: INR: 1.5

## 2016-05-06 ENCOUNTER — Encounter: Payer: Self-pay | Admitting: Internal Medicine

## 2016-05-08 ENCOUNTER — Other Ambulatory Visit: Payer: Self-pay | Admitting: Family Medicine

## 2016-05-19 ENCOUNTER — Ambulatory Visit (INDEPENDENT_AMBULATORY_CARE_PROVIDER_SITE_OTHER): Payer: Medicare Other

## 2016-05-19 DIAGNOSIS — I4891 Unspecified atrial fibrillation: Secondary | ICD-10-CM

## 2016-05-19 DIAGNOSIS — Z5181 Encounter for therapeutic drug level monitoring: Secondary | ICD-10-CM | POA: Diagnosis not present

## 2016-05-19 LAB — POCT INR: INR: 1.5

## 2016-06-08 ENCOUNTER — Ambulatory Visit: Payer: Medicare Other | Admitting: Family Medicine

## 2016-06-09 ENCOUNTER — Ambulatory Visit (INDEPENDENT_AMBULATORY_CARE_PROVIDER_SITE_OTHER): Payer: Medicare Other

## 2016-06-09 DIAGNOSIS — I4891 Unspecified atrial fibrillation: Secondary | ICD-10-CM

## 2016-06-09 DIAGNOSIS — Z5181 Encounter for therapeutic drug level monitoring: Secondary | ICD-10-CM

## 2016-06-09 LAB — POCT INR: INR: 1.1

## 2016-07-06 ENCOUNTER — Encounter: Payer: Medicare Other | Admitting: *Deleted

## 2016-07-06 ENCOUNTER — Telehealth: Payer: Self-pay | Admitting: Cardiology

## 2016-07-06 NOTE — Telephone Encounter (Signed)
LMOVM reminding pt to send remote transmission.   

## 2016-07-07 ENCOUNTER — Ambulatory Visit (INDEPENDENT_AMBULATORY_CARE_PROVIDER_SITE_OTHER): Payer: Medicare Other

## 2016-07-07 DIAGNOSIS — I4891 Unspecified atrial fibrillation: Secondary | ICD-10-CM | POA: Diagnosis not present

## 2016-07-07 DIAGNOSIS — Z5181 Encounter for therapeutic drug level monitoring: Secondary | ICD-10-CM

## 2016-07-07 LAB — POCT INR: INR: 1.5

## 2016-07-08 ENCOUNTER — Encounter: Payer: Self-pay | Admitting: Cardiology

## 2016-07-21 ENCOUNTER — Ambulatory Visit (INDEPENDENT_AMBULATORY_CARE_PROVIDER_SITE_OTHER): Payer: Medicare Other

## 2016-07-21 DIAGNOSIS — I4891 Unspecified atrial fibrillation: Secondary | ICD-10-CM

## 2016-07-21 DIAGNOSIS — Z5181 Encounter for therapeutic drug level monitoring: Secondary | ICD-10-CM | POA: Diagnosis not present

## 2016-07-21 LAB — POCT INR: INR: 2

## 2016-07-30 ENCOUNTER — Encounter: Payer: Self-pay | Admitting: Cardiology

## 2016-08-02 ENCOUNTER — Other Ambulatory Visit: Payer: Self-pay | Admitting: Cardiovascular Disease

## 2016-08-02 NOTE — Telephone Encounter (Signed)
Please review for refill. Thanks!  

## 2016-08-11 ENCOUNTER — Ambulatory Visit (INDEPENDENT_AMBULATORY_CARE_PROVIDER_SITE_OTHER): Payer: Medicare Other

## 2016-08-11 DIAGNOSIS — I4891 Unspecified atrial fibrillation: Secondary | ICD-10-CM

## 2016-08-11 DIAGNOSIS — Z5181 Encounter for therapeutic drug level monitoring: Secondary | ICD-10-CM

## 2016-08-11 LAB — POCT INR: INR: 3

## 2016-08-21 ENCOUNTER — Other Ambulatory Visit: Payer: Self-pay | Admitting: Cardiovascular Disease

## 2016-08-23 NOTE — Telephone Encounter (Signed)
Will you please review for refill, Thanks! 

## 2016-09-08 ENCOUNTER — Ambulatory Visit (INDEPENDENT_AMBULATORY_CARE_PROVIDER_SITE_OTHER): Payer: Medicare Other

## 2016-09-08 DIAGNOSIS — Z5181 Encounter for therapeutic drug level monitoring: Secondary | ICD-10-CM | POA: Diagnosis not present

## 2016-09-08 DIAGNOSIS — I4891 Unspecified atrial fibrillation: Secondary | ICD-10-CM

## 2016-09-08 LAB — POCT INR: INR: 2.3

## 2016-10-06 ENCOUNTER — Ambulatory Visit (INDEPENDENT_AMBULATORY_CARE_PROVIDER_SITE_OTHER): Payer: Medicare Other | Admitting: *Deleted

## 2016-10-06 DIAGNOSIS — Z5181 Encounter for therapeutic drug level monitoring: Secondary | ICD-10-CM | POA: Diagnosis not present

## 2016-10-06 DIAGNOSIS — I4891 Unspecified atrial fibrillation: Secondary | ICD-10-CM

## 2016-10-06 LAB — POCT INR: INR: 1.1

## 2016-10-13 ENCOUNTER — Ambulatory Visit (INDEPENDENT_AMBULATORY_CARE_PROVIDER_SITE_OTHER): Payer: Medicare Other

## 2016-10-13 DIAGNOSIS — I4891 Unspecified atrial fibrillation: Secondary | ICD-10-CM

## 2016-10-13 DIAGNOSIS — Z5181 Encounter for therapeutic drug level monitoring: Secondary | ICD-10-CM | POA: Diagnosis not present

## 2016-10-13 LAB — POCT INR: INR: 2.3

## 2016-10-26 ENCOUNTER — Encounter: Payer: Self-pay | Admitting: Family Medicine

## 2016-10-26 ENCOUNTER — Ambulatory Visit (INDEPENDENT_AMBULATORY_CARE_PROVIDER_SITE_OTHER): Payer: Medicare Other | Admitting: Family Medicine

## 2016-10-26 ENCOUNTER — Ambulatory Visit
Admission: RE | Admit: 2016-10-26 | Discharge: 2016-10-26 | Disposition: A | Discharge status: Home / Self Care | Payer: Medicare Other | Source: Ambulatory Visit | Attending: Family Medicine | Admitting: Family Medicine

## 2016-10-26 VITALS — BP 140/76 | HR 84 | Temp 97.9°F | Resp 17 | Ht <= 58 in | Wt 153.6 lb

## 2016-10-26 DIAGNOSIS — E039 Hypothyroidism, unspecified: Secondary | ICD-10-CM

## 2016-10-26 DIAGNOSIS — M7989 Other specified soft tissue disorders: Secondary | ICD-10-CM | POA: Insufficient documentation

## 2016-10-26 DIAGNOSIS — Z7901 Long term (current) use of anticoagulants: Secondary | ICD-10-CM | POA: Diagnosis not present

## 2016-10-26 DIAGNOSIS — I4891 Unspecified atrial fibrillation: Secondary | ICD-10-CM | POA: Diagnosis not present

## 2016-10-26 LAB — CBC WITH DIFFERENTIAL/PLATELET
BASOS ABS: 0 {cells}/uL (ref 0–200)
Basophils Relative: 0 %
EOS ABS: 88 {cells}/uL (ref 15–500)
Eosinophils Relative: 2 %
HCT: 41.4 % (ref 35.0–45.0)
HEMOGLOBIN: 13.7 g/dL (ref 11.7–15.5)
LYMPHS ABS: 1232 {cells}/uL (ref 850–3900)
Lymphocytes Relative: 28 %
MCH: 31.6 pg (ref 27.0–33.0)
MCHC: 33.1 g/dL (ref 32.0–36.0)
MCV: 95.4 fL (ref 80.0–100.0)
MONOS PCT: 4 %
MPV: 10.9 fL (ref 7.5–12.5)
Monocytes Absolute: 176 cells/uL — ABNORMAL LOW (ref 200–950)
NEUTROS PCT: 66 %
Neutro Abs: 2904 cells/uL (ref 1500–7800)
Platelets: 182 10*3/uL (ref 140–400)
RBC: 4.34 MIL/uL (ref 3.80–5.10)
RDW: 13.6 % (ref 11.0–15.0)
WBC: 4.4 10*3/uL (ref 3.8–10.8)

## 2016-10-26 MED ORDER — LEVOTHYROXINE SODIUM 75 MCG PO TABS: 75.0000 ug | ORAL_TABLET | Freq: Every day | ORAL | 0 refills | Status: DC

## 2016-10-26 NOTE — Progress Notes (Signed)
Name: Felicia Poole   MRN: [id number]    DOB: 10-18-1945   Date:10/26/2016       Progress Note  Subjective  Chief Complaint  Chief Complaint  Patient presents with  . Foot Swelling    Thyroid Problem  Presents for initial visit. Symptoms include fatigue. Patient reports no cold intolerance, constipation, depressed mood, diarrhea or palpitations. The symptoms have been stable. Her past medical history is significant for atrial fibrillation.    Patient presents for establishing care, previously followed by Dr. Rutherford Nail.   Patient has experienced bilateral swelling of the feet for one week, it has been painful to walk on the feet with the swelling. She has atrial fibrillation with non-ischemic cardiomyopathy and is on long-term anti-coagulation with Coumadin. She has also started taking Furosemide back after being off Furosemide for a long time. She is concerned that her toes appear to be swollen although not painful.    Past Medical History:  Diagnosis Date  . Atrial myxoma    Status post resection in 2012 at [facility]  . Benign neoplasm of heart   . Contact dermatitis and other eczema, due to unspecified cause   . Contusion of unspecified site   . Coronary atherosclerosis of unspecified type of vessel, native or graft    Status post CABG in March of 2012 at the time of atrial myxoma resection with LIMA to LAD done at [facility]  . Cramp of limb   . Essential hypertension, benign   . Heartburn   . Lumbago   . Nonischemic cardiomyopathy ([place])    EF: 45% in 2012  . Nonischemic cardiomyopathy ([place])   . Osteoporosis, unspecified   . Other and unspecified hyperlipidemia   . Other specified cardiac dysrhythmias(427.89)   . Postsurgical percutaneous transluminal coronary angioplasty status   . Type II or unspecified type diabetes mellitus without mention of complication, not stated as uncontrolled   . Unspecified hypothyroidism   . Unspecified menopausal and postmenopausal disorder     Past  Surgical History:  Procedure Laterality Date  . ATRIAL MYXOMA EXCISION Left   . CARDIAC CATHETERIZATION  2012   [facility]; right and left heart 75% ostial LAD lesion  . CORONARY ARTERY BYPASS GRAFT  2011   [facility]  . INSERT / REPLACE / REMOVE PACEMAKER  02/2013   Dual chamber MDT advisa pacemaker. [id number]  . VAGINAL HYSTERECTOMY      Family History  Problem Relation Age of Onset  . Heart failure Mother     Social History   Social History  . Marital status: Married    Spouse name: N/A  . Number of children: N/A  . Years of education: N/A   Occupational History  . Not on file.   Social History Main Topics  . Smoking status: Never Smoker  . Smokeless tobacco: Never Used  . Alcohol use No  . Drug use: No  . Sexual activity: Not on file   Other Topics Concern  . Not on file   Social History Narrative  . No narrative on file     Current Outpatient Prescriptions:  .  amLODipine (NORVASC) 10 MG tablet, Take 1 tablet (10 mg total) by mouth daily., Disp: 90 tablet, Rfl: 0 .  atorvastatin (LIPITOR) 80 MG tablet, Take 80 mg by mouth daily., Disp: , Rfl:  .  chlorthalidone (HYGROTON) 25 MG tablet, Take 25 mg by mouth daily., Disp: , Rfl:  .  fluticasone (FLONASE) 50 MCG/ACT nasal spray, SPRAY 1 SPRAY IN EACH NOSTRIL  2 TIMES A DAY, Disp: 16 g, Rfl: 0 .  furosemide (LASIX) 40 MG tablet, Take 40 mg by mouth daily as needed. , Disp: , Rfl:  .  levothyroxine (SYNTHROID, LEVOTHROID) 75 MCG tablet, Take 1 tablet (75 mcg total) by mouth daily., Disp: 90 tablet, Rfl: 0 .  lisinopril (PRINIVIL,ZESTRIL) 20 MG tablet, TAKE 1 TABLET (20 MG TOTAL) BY MOUTH DAILY., Disp: 90 tablet, Rfl: 0 .  ranitidine (ZANTAC) 300 MG tablet, Take 300 mg by mouth at bedtime., Disp: , Rfl:  .  sotalol (BETAPACE) 80 MG tablet, TAKE 1 TABLET BY MOUTH EVERY 12 HOURS, Disp: 180 tablet, Rfl: 3 .  terbinafine (LAMISIL AT) 1 % cream, Apply 1 application topically 2 (two) times daily., Disp: 30 g, Rfl: 0 .  traMADol (ULTRAM)  50 MG tablet, Take 1 tablet (50 mg total) by mouth every 12 (twelve) hours as needed for moderate pain., Disp: 10 tablet, Rfl: 0 .  warfarin (COUMADIN) 2.5 MG tablet, TAKE 1.5 - 2 TABLETS DAILY AS DIRECTED BY COUMADIN CLINIC, Disp: 60 tablet, Rfl: 3  No Known Allergies   Review of Systems  Constitutional: Positive for fatigue. Negative for chills and fever.  Respiratory: Positive for shortness of breath and wheezing. Negative for cough.   Cardiovascular: Positive for leg swelling. Negative for chest pain and palpitations.  Gastrointestinal: Negative for constipation and diarrhea.  Musculoskeletal: Negative for myalgias.  Endo/Heme/Allergies: Negative for cold intolerance.     Objective  Vitals:   10/26/16 1349  BP: 140/76  Pulse: 84  Resp: 17  Temp: 97.9 F (36.6 C)  TempSrc: Oral  SpO2: 96%  Weight: 153 lb 9.6 oz (69.7 kg)  Height: '4\' 7"'  (1.397 m)    Physical Exam  Constitutional: She is oriented to person, place, and time and well-developed, well-nourished, and in no distress.  HENT:  Head: Normocephalic and atraumatic.  Cardiovascular: Normal rate, regular rhythm, S1 normal, S2 normal and normal heart sounds.   No murmur heard. Pulmonary/Chest: Breath sounds normal. She has no wheezes. She has no rhonchi.  Abdominal: Soft. Bowel sounds are normal. There is no tenderness.  Musculoskeletal:       Right ankle: She exhibits swelling. No tenderness.       Left ankle: She exhibits swelling. No tenderness.  Multiple healing bruises on the distal feet over the toes, mild generalized swelling on both feet.   Neurological: She is alert and oriented to person, place, and time.  Psychiatric: Mood, memory, affect and judgment normal.  Nursing note and vitals reviewed.    Assessment & Plan  1. Adult hypothyroidism Obtain TSH and adjust the dosage of Synthroid as appropriate - TSH - levothyroxine (SYNTHROID, LEVOTHROID) 75 MCG tablet; Take 1 tablet (75 mcg total) by mouth  daily.  Dispense: 90 tablet; Refill: 0  2. Bilateral swelling of feet Entertained multiple etiologies, obtain lab work to rule out infection versus inflammation versus musculoskeletal, likely because of the underlying cardiomyopathy and the fact that she was not taking Lasix as prescribed. Obtain ultrasound to rule out DVTs. Follow-up after review of lab and imaging workup - DG Foot 2 Views Left; Future - DG Foot 2 Views Right; Future - CBC with Differential/Platelet - COMPLETE METABOLIC PANEL WITH GFR - Sed Rate (ESR) - Uric acid - VAS [place] LOWER EXTREMITY VENOUS (DVT); Future   Tynetta Bachmann [facility] 10/26/2016 2:01 PM

## 2016-10-27 ENCOUNTER — Ambulatory Visit (INDEPENDENT_AMBULATORY_CARE_PROVIDER_SITE_OTHER): Payer: Medicare Other

## 2016-10-27 DIAGNOSIS — I4891 Unspecified atrial fibrillation: Secondary | ICD-10-CM | POA: Diagnosis not present

## 2016-10-27 DIAGNOSIS — Z5181 Encounter for therapeutic drug level monitoring: Secondary | ICD-10-CM

## 2016-10-27 LAB — URIC ACID: URIC ACID, SERUM: 5 mg/dL (ref 2.5–7.0)

## 2016-10-27 LAB — COMPLETE METABOLIC PANEL WITH GFR
ALT: 15 U/L (ref 6–29)
AST: 21 U/L (ref 10–35)
Albumin: 4.2 g/dL (ref 3.6–5.1)
Alkaline Phosphatase: 96 U/L (ref 33–130)
BILIRUBIN TOTAL: 0.9 mg/dL (ref 0.2–1.2)
BUN: 18 mg/dL (ref 7–25)
CO2: 24 mmol/L (ref 20–31)
Calcium: 9.6 mg/dL (ref 8.6–10.4)
Chloride: 105 mmol/L (ref 98–110)
Creat: 1.02 mg/dL — ABNORMAL HIGH (ref 0.60–0.93)
GFR, EST AFRICAN AMERICAN: 64 mL/min (ref >=60)
GFR, EST NON AFRICAN AMERICAN: 55 mL/min — AB (ref >=60)
GLUCOSE: 74 mg/dL (ref 65–99)
POTASSIUM: 4.5 mmol/L (ref 3.5–5.3)
SODIUM: 141 mmol/L (ref 135–146)
Total Protein: 7 g/dL (ref 6.1–8.1)

## 2016-10-27 LAB — SEDIMENTATION RATE: SED RATE: 19 mm/h (ref 0–30)

## 2016-10-27 LAB — POCT INR: INR: 2.6

## 2016-10-27 LAB — TSH: TSH: 3.84 m[IU]/L

## 2016-11-24 ENCOUNTER — Ambulatory Visit (INDEPENDENT_AMBULATORY_CARE_PROVIDER_SITE_OTHER): Payer: Medicare Other | Admitting: *Deleted

## 2016-11-24 DIAGNOSIS — I4891 Unspecified atrial fibrillation: Secondary | ICD-10-CM

## 2016-11-24 DIAGNOSIS — Z5181 Encounter for therapeutic drug level monitoring: Secondary | ICD-10-CM | POA: Diagnosis not present

## 2016-11-24 LAB — POCT INR: INR: 1.4

## 2016-11-25 ENCOUNTER — Other Ambulatory Visit: Payer: Self-pay | Admitting: *Deleted

## 2016-11-25 MED ORDER — WARFARIN SODIUM 2.5 MG PO TABS: ORAL_TABLET | ORAL | 3 refills | Status: DC

## 2016-11-25 NOTE — Telephone Encounter (Signed)
Pharmacy requests ninety day. 

## 2016-11-26 ENCOUNTER — Other Ambulatory Visit: Payer: Self-pay | Admitting: *Deleted

## 2016-11-26 MED ORDER — WARFARIN SODIUM 2.5 MG PO TABS: ORAL_TABLET | ORAL | 3 refills | Status: DC

## 2016-11-26 NOTE — Telephone Encounter (Signed)
Pharmacy requests ninety day. 

## 2016-12-03 ENCOUNTER — Ambulatory Visit (INDEPENDENT_AMBULATORY_CARE_PROVIDER_SITE_OTHER): Payer: Medicare Other

## 2016-12-03 ENCOUNTER — Other Ambulatory Visit: Payer: Self-pay

## 2016-12-03 DIAGNOSIS — I4891 Unspecified atrial fibrillation: Secondary | ICD-10-CM | POA: Diagnosis not present

## 2016-12-03 DIAGNOSIS — Z7901 Long term (current) use of anticoagulants: Principal | ICD-10-CM

## 2016-12-03 DIAGNOSIS — Z5181 Encounter for therapeutic drug level monitoring: Secondary | ICD-10-CM

## 2016-12-03 LAB — POCT INR: INR: 2

## 2016-12-06 ENCOUNTER — Ambulatory Visit: Payer: Medicare Other

## 2016-12-22 ENCOUNTER — Ambulatory Visit (INDEPENDENT_AMBULATORY_CARE_PROVIDER_SITE_OTHER): Payer: Medicare Other | Admitting: *Deleted

## 2016-12-22 DIAGNOSIS — Z5181 Encounter for therapeutic drug level monitoring: Secondary | ICD-10-CM

## 2016-12-22 DIAGNOSIS — I4891 Unspecified atrial fibrillation: Secondary | ICD-10-CM

## 2016-12-22 LAB — POCT INR: INR: 2.1

## 2017-01-09 ENCOUNTER — Other Ambulatory Visit: Payer: Self-pay | Admitting: Internal Medicine

## 2017-01-10 ENCOUNTER — Encounter: Payer: Self-pay | Admitting: *Deleted

## 2017-01-10 DIAGNOSIS — E039 Hypothyroidism, unspecified: Secondary | ICD-10-CM | POA: Insufficient documentation

## 2017-01-10 DIAGNOSIS — Z79899 Other long term (current) drug therapy: Secondary | ICD-10-CM | POA: Insufficient documentation

## 2017-01-10 DIAGNOSIS — Z7901 Long term (current) use of anticoagulants: Secondary | ICD-10-CM | POA: Insufficient documentation

## 2017-01-10 DIAGNOSIS — Z760 Encounter for issue of repeat prescription: Secondary | ICD-10-CM | POA: Diagnosis not present

## 2017-01-10 DIAGNOSIS — I10 Essential (primary) hypertension: Secondary | ICD-10-CM | POA: Diagnosis not present

## 2017-01-10 DIAGNOSIS — I2510 Atherosclerotic heart disease of native coronary artery without angina pectoris: Secondary | ICD-10-CM | POA: Diagnosis not present

## 2017-01-10 DIAGNOSIS — E119 Type 2 diabetes mellitus without complications: Secondary | ICD-10-CM | POA: Insufficient documentation

## 2017-01-10 NOTE — ED Triage Notes (Signed)
Pt ambulatory to triage.  Pt out of sotalol fir 1 day,  Pt requesting med refill.  Pt alert.

## 2017-01-11 ENCOUNTER — Emergency Department
Admission: EM | Admit: 2017-01-11 | Discharge: 2017-01-11 | Disposition: A | Discharge status: Home / Self Care | Payer: Medicare Other | Attending: Emergency Medicine | Admitting: Emergency Medicine

## 2017-01-11 ENCOUNTER — Telehealth: Payer: Self-pay | Admitting: Family Medicine

## 2017-01-11 DIAGNOSIS — Z760 Encounter for issue of repeat prescription: Secondary | ICD-10-CM

## 2017-01-11 MED ORDER — SOTALOL HCL 80 MG PO TABS: 80.0000 mg | ORAL_TABLET | Freq: Two times a day (BID) | ORAL | 0 refills | Status: DC

## 2017-01-11 MED ORDER — SOTALOL HCL 80 MG PO TABS
80.0000 mg | ORAL_TABLET | Freq: Once | ORAL | Status: AC
  Administered 2017-01-11: 80 mg via ORAL
  Filled 2017-01-11: qty 1

## 2017-01-11 NOTE — ED Provider Notes (Signed)
[facility] Emergency Department Provider Note   ____________________________________________   First MD Initiated Contact with Patient 01/11/17 [phone]     (approximate)  I have reviewed the triage vital signs and the nursing notes.   HISTORY  Chief Complaint Medication Refill    HPI Felicia Poole is a 71 y.o. female who comes into the hospital today as she is out of her medication. The patient realized she was low on Sunday and went to the pharmacy and found that she didn't have any refills. The patient went back to the pharmacy today and the doctor did not yet call in a new prescription. The patient takes sotalol 80 mg twice a day. The patient's family states that she has not had a medication for a whole day. They're concerned because she really needs his medication. They called her 53 office and they said that they could not find a 24-hour pharmacy. They informed her that she should come to the ER to try to get her medication until she could get it filled in the morning. The patient has no complaints. They state that the physician called in a prescription and they will be able to get it tomorrow. She is here for her medication.   Past Medical History:  Diagnosis Date  . Atrial myxoma    Status post resection in 2012 at [facility]  . Benign neoplasm of heart   . Contact dermatitis and other eczema, due to unspecified cause   . Contusion of unspecified site   . Coronary atherosclerosis of unspecified type of vessel, native or graft    Status post CABG in March of 2012 at the time of atrial myxoma resection with LIMA to LAD done at [facility]  . Cramp of limb   . Essential hypertension, benign   . Heartburn   . Lumbago   . Nonischemic cardiomyopathy ([place])    EF: 45% in 2012  . Nonischemic cardiomyopathy ([place])   . Osteoporosis, unspecified   . Other and unspecified hyperlipidemia   . Other specified cardiac dysrhythmias(427.89)   . Postsurgical  percutaneous transluminal coronary angioplasty status   . Type II or unspecified type diabetes mellitus without mention of complication, not stated as uncontrolled   . Unspecified hypothyroidism   . Unspecified menopausal and postmenopausal disorder     Patient Active Problem List   Diagnosis Date Noted  . Diabetes mellitus ([place]) 05/08/2015  . 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 Easy bruisability 04/29/2015  . Chronic anticoagulation 04/29/2015  . Adiposity 12/10/2014  . Obesity 12/10/2014  . Accumulation of fluid in tissues 10/15/2013  . Hypercholesterolemia 10/15/2013  . Encounter for therapeutic drug monitoring 08/08/2013  . Pacemaker -Medtronic 05/24/2013  . Tachy-brady syndrome ([place]) 03/02/2013  . Nonischemic cardiomyopathy ([place])   . Atrial fibrillation ([place]) 02/28/2013  . Other malaise 02/26/2013  . Arteriosclerosis of coronary artery 02/22/2013  . BP (high blood pressure) 02/22/2013  . Adult hypothyroidism 02/22/2013  . Congestive heart failure with left ventricular systolic dysfunction ([place]) 02/22/2013  . Type 2 diabetes mellitus ([place]) 02/22/2013  . Tachycardia-bradycardia ([place]) 02/22/2013  . Essential hypertension, benign   . Atrial myxoma   . Coronary atherosclerosis     Past Surgical History:  Procedure Laterality Date  . ATRIAL MYXOMA EXCISION Left   . CARDIAC CATHETERIZATION  2012   [facility]; right and left heart 75% ostial LAD lesion  . CORONARY ARTERY BYPASS GRAFT  2011   [facility]  . INSERT / REPLACE / REMOVE PACEMAKER  02/2013   Dual chamber MDT advisa pacemaker. [id number]  . VAGINAL HYSTERECTOMY      Prior to Admission medications   Medication Sig Start Date End Date Taking? Authorizing Provider  amLODipine (NORVASC) 10 MG tablet Take 1 tablet (10 mg total) by mouth daily. 06/10/15   Ashok Norris, MD  atorvastatin (LIPITOR) 80 MG tablet Take 80 mg by mouth daily.    [provider]  chlorthalidone (HYGROTON) 25 MG tablet Take 25  mg by mouth daily.    [provider]  fluticasone (FLONASE) 50 MCG/ACT nasal spray SPRAY 1 SPRAY IN EACH NOSTRIL 2 TIMES A DAY 12/12/14   Ashok Norris, MD  furosemide (LASIX) 40 MG tablet Take 40 mg by mouth daily as needed.     [provider]  levothyroxine (SYNTHROID, LEVOTHROID) 75 MCG tablet Take 1 tablet (75 mcg total) by mouth daily. 10/26/16   Roselee Nova, MD  lisinopril (PRINIVIL,ZESTRIL) 20 MG tablet TAKE 1 TABLET (20 MG TOTAL) BY MOUTH DAILY. 09/02/15   Ashok Norris, MD  ranitidine (ZANTAC) 300 MG tablet Take 300 mg by mouth at bedtime.    [provider]  sotalol (BETAPACE) 80 MG tablet TAKE 1 TABLET BY MOUTH EVERY 12 HOURS 01/10/17   Deboraha Sprang, MD  sotalol (BETAPACE) 80 MG tablet Take 1 tablet (80 mg total) by mouth 2 (two) times daily. 01/11/17   Loney Hering, MD  terbinafine (LAMISIL AT) 1 % cream Apply 1 application topically 2 (two) times daily. 04/29/15   Ashok Norris, MD  traMADol (ULTRAM) 50 MG tablet Take 1 tablet (50 mg total) by mouth every 12 (twelve) hours as needed for moderate pain. 10/06/15   Sable Feil, PA-C  warfarin (COUMADIN) 2.5 MG tablet Take as directed by [facility] 11/26/16   Wellington Hampshire, MD    Allergies Patient has no known allergies.  Family History  Problem Relation Age of Onset  . Heart failure Mother     Social History Social History  Substance Use Topics  . Smoking status: Never Smoker  . Smokeless tobacco: Never Used  . Alcohol use No    Review of Systems  Constitutional: No fever/chills Eyes: No visual changes. ENT: No sore throat. Cardiovascular: Denies chest pain. Respiratory: Denies shortness of breath. Gastrointestinal: No abdominal pain.  No nausea, no vomiting.  No diarrhea.  No constipation. Genitourinary: Negative for dysuria. Musculoskeletal: Negative for back pain. Skin: Negative for rash. Neurological: Negative for headaches, focal weakness or  numbness.   ____________________________________________   PHYSICAL EXAM:  VITAL SIGNS: ED Triage Vitals  Enc Vitals Group     BP 01/10/17 2319 135/72     Pulse Rate 01/10/17 2319 80     Resp 01/10/17 2319 18     Temp 01/10/17 2319 98.9 F (37.2 C)     Temp Source 01/10/17 2319 Oral     SpO2 01/10/17 2319 96 %     Weight 01/10/17 2320 153 lb 8 oz (69.6 kg)     Height 01/10/17 2320 4\' 11"  (1.499 m)     Head Circumference --      Peak Flow --      Pain Score --      Pain Loc --      Pain Edu? --      Excl. in [place]? --     Constitutional: Alert and oriented. Well appearing and in no acute distress. Eyes: Conjunctivae are normal. PERRL. EOMI. Head: Atraumatic. Nose: No congestion/rhinnorhea.  Mouth/Throat: Mucous membranes are moist.  Oropharynx non-erythematous. Cardiovascular: Normal rate, regular rhythm. Grossly normal heart sounds.  Good peripheral circulation. Respiratory: Normal respiratory effort.  No retractions. Lungs CTAB. Gastrointestinal: Soft and nontender. No distention. Positive bowel sounds Musculoskeletal: No lower extremity tenderness nor edema.  Neurologic:  Normal speech and language.  Skin:  Skin is warm, dry and intact. Marland Kitchen Psychiatric: Mood and affect are normal..  ____________________________________________   LABS (all labs ordered are listed, but only abnormal results are displayed)  Labs Reviewed - No data to display ____________________________________________  EKG  none ____________________________________________  RADIOLOGY  No results found.  ____________________________________________   PROCEDURES  Procedure(s) performed: None  Procedures  Critical Care performed: No  ____________________________________________   INITIAL IMPRESSION / ASSESSMENT AND PLAN / ED COURSE  Pertinent labs & imaging results that were available during my care of the patient were reviewed by me and considered in my medical decision making (see  chart for details).  This is a 71 year old female who comes into the hospital today who is out of her medication. The patient is here as she is afraid to miss any doses of her medication. The patient has not received it at all today. I will give the patient a dose of her sotalol 80 mg and I did write a prescription for 6 pills. In the event that her doctor did not call in her prescription to her appropriate pharmacy she can fill this prescription in the morning. The patient's family and the patient understand that she should not fill it if her doctor has a ready called in a prescription. The patient be discharged home.      ____________________________________________   FINAL CLINICAL IMPRESSION(S) / ED DIAGNOSES  Final diagnoses:  Medication refill      NEW MEDICATIONS STARTED DURING THIS VISIT:  Discharge Medication List as of 01/11/2017  3:07 AM    START taking these medications   Details  !! sotalol (BETAPACE) 80 MG tablet Take 1 tablet (80 mg total) by mouth 2 (two) times daily., Starting Tue 01/11/2017, Print     !! - Potential duplicate medications found. Please discuss with provider.       Note:  This document was prepared using Dragon voice recognition software and may include unintentional dictation errors.    Loney Hering, MD 01/11/17 [phone]

## 2017-01-11 NOTE — ED Notes (Signed)
Per pt and family, pt has not had sotalol since yest. Family reports "no refill" on prescription and called her PCP who family states "was supposed to call it in but didn't." Family states she did not take her am or pm dose yest. Family states wanting med refill. Family has prescription with them at this time.

## 2017-01-11 NOTE — Telephone Encounter (Signed)
Late entry: I was called by after hours nurse last night; patient was out of sotalol; approved emergency supply to 24 hour pharmacy to be called in by the nurse last night Upon review of the chart, it appears that the cardiologist prescribes this medicine and he actually approved an entire year of this drug (sotalol) yesterday She should not be out at this point

## 2017-01-11 NOTE — Discharge Instructions (Signed)
Please follow up with your doctor to see if a refill was called in. Use this prescription only if your physician has not yet called in a refill.

## 2017-01-19 ENCOUNTER — Ambulatory Visit (INDEPENDENT_AMBULATORY_CARE_PROVIDER_SITE_OTHER): Payer: Medicare Other

## 2017-01-19 DIAGNOSIS — Z5181 Encounter for therapeutic drug level monitoring: Secondary | ICD-10-CM

## 2017-01-19 DIAGNOSIS — I4891 Unspecified atrial fibrillation: Secondary | ICD-10-CM | POA: Diagnosis not present

## 2017-01-19 LAB — POCT INR: INR: 1.5

## 2017-01-24 ENCOUNTER — Ambulatory Visit (INDEPENDENT_AMBULATORY_CARE_PROVIDER_SITE_OTHER): Payer: Medicare Other

## 2017-01-24 VITALS — BP 132/82 | HR 76 | Temp 97.4°F | Ht 59.0 in | Wt 155.3 lb

## 2017-01-24 DIAGNOSIS — Z0000 Encounter for general adult medical examination without abnormal findings: Secondary | ICD-10-CM

## 2017-01-24 NOTE — Patient Instructions (Signed)
Felicia Poole , Thank you for taking time to come for your Medicare Wellness Visit. I appreciate your ongoing commitment to your health goals. Please review the following plan we discussed and let me know if I can assist you in the future.   Screening recommendations/referrals: Colonoscopy: up to date, due 08/2022 Mammogram: needs referral from PCP Bone Density: up to date Recommended yearly ophthalmology/optometry visit for glaucoma screening and checkup Recommended yearly dental visit for hygiene and checkup  Vaccinations: Influenza vaccine: due fall 2018 Pneumococcal vaccine: completed series Tdap vaccine: completed 04/28/12, due 04/2022 Shingles vaccine: declined  Advanced directives: Advance directive discussed with you today. I have provided resources for you to complete at home and have notarized. Once this is complete please bring a copy in to our office so we can scan it into your chart.   Conditions/risks identified: Obesity; Recommend cutting out extra servings and snacking in between meals.   Next appointment: None, need to schedule a physical with PCP and 1 year AWV.   Preventive Care 72 Years and Older, Female Preventive care refers to lifestyle choices and visits with your health care provider that can promote health and wellness. What does preventive care include?  A yearly physical exam. This is also called an annual well check.  Dental exams once or twice a year.  Routine eye exams. Ask your health care provider how often you should have your eyes checked.  Personal lifestyle choices, including:  Daily care of your teeth and gums.  Regular physical activity.  Eating a healthy diet.  Avoiding tobacco and drug use.  Limiting alcohol use.  Practicing safe sex.  Taking low-dose aspirin every day.  Taking vitamin and mineral supplements as recommended by your health care provider. What happens during an annual well check? The services and screenings  done by your health care provider during your annual well check will depend on your age, overall health, lifestyle risk factors, and family history of disease. Counseling  Your health care provider may ask you questions about your:  Alcohol use.  Tobacco use.  Drug use.  Emotional well-being.  Home and relationship well-being.  Sexual activity.  Eating habits.  History of falls.  Memory and ability to understand (cognition).  Work and work Statistician.  Reproductive health. Screening  You may have the following tests or measurements:  Height, weight, and BMI.  Blood pressure.  Lipid and cholesterol levels. These may be checked every 5 years, or more frequently if you are over 30 years old.  Skin check.  Lung cancer screening. You may have this screening every year starting at age 44 if you have a 30-pack-year history of smoking and currently smoke or have quit within the past 15 years.  Fecal occult blood test (FOBT) of the stool. You may have this test every year starting at age 67.  Flexible sigmoidoscopy or colonoscopy. You may have a sigmoidoscopy every 5 years or a colonoscopy every 10 years starting at age 24.  Hepatitis C blood test.  Hepatitis B blood test.  Sexually transmitted disease (STD) testing.  Diabetes screening. This is done by checking your blood sugar (glucose) after you have not eaten for a while (fasting). You may have this done every 1-3 years.  Bone density scan. This is done to screen for osteoporosis. You may have this done starting at age 77.  Mammogram. This may be done every 1-2 years. Talk to your health care provider about how often you should have regular mammograms. Talk  with your health care provider about your test results, treatment options, and if necessary, the need for more tests. Vaccines  Your health care provider may recommend certain vaccines, such as:  Influenza vaccine. This is recommended every year.  Tetanus,  diphtheria, and acellular pertussis (Tdap, Td) vaccine. You may need a Td booster every 10 years.  Zoster vaccine. You may need this after age 37.  Pneumococcal 13-valent conjugate (PCV13) vaccine. One dose is recommended after age 10.  Pneumococcal polysaccharide (PPSV23) vaccine. One dose is recommended after age 3. Talk to your health care provider about which screenings and vaccines you need and how often you need them. This information is not intended to replace advice given to you by your health care provider. Make sure you discuss any questions you have with your health care provider. Document Released: 06/20/2015 Document Revised: 02/11/2016 Document Reviewed: 03/25/2015 Elsevier Interactive Patient Education  2017 [place] Prevention in the Home Falls can cause injuries. They can happen to people of all ages. There are many things you can do to make your home safe and to help prevent falls. What can I do on the outside of my home?  Regularly fix the edges of walkways and driveways and fix any cracks.  Remove anything that might make you trip as you walk through a door, such as a raised step or threshold.  Trim any bushes or trees on the path to your home.  Use bright outdoor lighting.  Clear any walking paths of anything that might make someone trip, such as rocks or tools.  Regularly check to see if handrails are loose or broken. Make sure that both sides of any steps have handrails.  Any raised decks and porches should have guardrails on the edges.  Have any leaves, snow, or ice cleared regularly.  Use sand or salt on walking paths during winter.  Clean up any spills in your garage right away. This includes oil or grease spills. What can I do in the bathroom?  Use night lights.  Install grab bars by the toilet and in the tub and shower. Do not use towel bars as grab bars.  Use non-skid mats or decals in the tub or shower.  If you need to sit down in  the shower, use a plastic, non-slip stool.  Keep the floor dry. Clean up any water that spills on the floor as soon as it happens.  Remove soap buildup in the tub or shower regularly.  Attach bath mats securely with double-sided non-slip rug tape.  Do not have throw rugs and other things on the floor that can make you trip. What can I do in the bedroom?  Use night lights.  Make sure that you have a light by your bed that is easy to reach.  Do not use any sheets or blankets that are too big for your bed. They should not hang down onto the floor.  Have a firm chair that has side arms. You can use this for support while you get dressed.  Do not have throw rugs and other things on the floor that can make you trip. What can I do in the kitchen?  Clean up any spills right away.  Avoid walking on wet floors.  Keep items that you use a lot in easy-to-reach places.  If you need to reach something above you, use a strong step stool that has a grab bar.  Keep electrical cords out of the way.  Do  not use floor polish or wax that makes floors slippery. If you must use wax, use non-skid floor wax.  Do not have throw rugs and other things on the floor that can make you trip. What can I do with my stairs?  Do not leave any items on the stairs.  Make sure that there are handrails on both sides of the stairs and use them. Fix handrails that are broken or loose. Make sure that handrails are as long as the stairways.  Check any carpeting to make sure that it is firmly attached to the stairs. Fix any carpet that is loose or worn.  Avoid having throw rugs at the top or bottom of the stairs. If you do have throw rugs, attach them to the floor with carpet tape.  Make sure that you have a light switch at the top of the stairs and the bottom of the stairs. If you do not have them, ask someone to add them for you. What else can I do to help prevent falls?  Wear shoes that:  Do not have high  heels.  Have rubber bottoms.  Are comfortable and fit you well.  Are closed at the toe. Do not wear sandals.  If you use a stepladder:  Make sure that it is fully opened. Do not climb a closed stepladder.  Make sure that both sides of the stepladder are locked into place.  Ask someone to hold it for you, if possible.  Clearly mark and make sure that you can see:  Any grab bars or handrails.  First and last steps.  Where the edge of each step is.  Use tools that help you move around (mobility aids) if they are needed. These include:  Canes.  Walkers.  Scooters.  Crutches.  Turn on the lights when you go into a dark area. Replace any light bulbs as soon as they burn out.  Set up your furniture so you have a clear path. Avoid moving your furniture around.  If any of your floors are uneven, fix them.  If there are any pets around you, be aware of where they are.  Review your medicines with your doctor. Some medicines can make you feel dizzy. This can increase your chance of falling. Ask your doctor what other things that you can do to help prevent falls. This information is not intended to replace advice given to you by your health care provider. Make sure you discuss any questions you have with your health care provider. Document Released: 03/20/2009 Document Revised: 10/30/2015 Document Reviewed: 06/28/2014 Elsevier Interactive Patient Education  2017 [employer].

## 2017-01-24 NOTE — Progress Notes (Signed)
Subjective:   Felicia Poole is a 71 y.o. female who presents for Medicare Annual (Subsequent) preventive examination.  Review of Systems:  N/A  Cardiac Risk Factors include: advanced age (>79men, >23 women);dyslipidemia;hypertension;obesity (BMI >30kg/m2);diabetes mellitus     Objective:     Vitals: BP 132/82 (BP Location: Left Arm)   Pulse 76   Temp (!) 97.4 F (36.3 C) (Oral)   Ht 4\' 11"  (1.499 m)   Wt 155 lb 4.8 oz (70.4 kg)   BMI 31.37 kg/m   Body mass index is 31.37 kg/m.   Tobacco History  Smoking Status  . Never Smoker  Smokeless Tobacco  . Never Used     Counseling given: Not Answered   Past Medical History:  Diagnosis Date  . Atrial myxoma    Status post resection in 2012 at [facility]  . Benign neoplasm of heart   . Contact dermatitis and other eczema, due to unspecified cause   . Contusion of unspecified site   . Coronary atherosclerosis of unspecified type of vessel, native or graft    Status post CABG in March of 2012 at the time of atrial myxoma resection with LIMA to LAD done at [facility]  . Cramp of limb   . Essential hypertension, benign   . Heartburn   . Lumbago   . Nonischemic cardiomyopathy ([place])    EF: 45% in 2012  . Nonischemic cardiomyopathy ([place])   . Osteoporosis, unspecified   . Other and unspecified hyperlipidemia   . Other specified cardiac dysrhythmias(427.89)   . Postsurgical percutaneous transluminal coronary angioplasty status   . Type II or unspecified type diabetes mellitus without mention of complication, not stated as uncontrolled   . Unspecified hypothyroidism   . Unspecified menopausal and postmenopausal disorder    Past Surgical History:  Procedure Laterality Date  . ATRIAL MYXOMA EXCISION Left   . CARDIAC CATHETERIZATION  2012   [facility]; right and left heart 75% ostial LAD lesion  . CORONARY ARTERY BYPASS GRAFT  2011   [facility]  . INSERT / REPLACE / REMOVE PACEMAKER  02/2013   Dual chamber MDT advisa pacemaker. [id number]  .  VAGINAL HYSTERECTOMY     Family History  Problem Relation Age of Onset  . Heart failure Mother    History  Sexual Activity  . Sexual activity: Not on file    Outpatient Encounter Prescriptions as of 01/24/2017  Medication Sig  . fluticasone (FLONASE) 50 MCG/ACT nasal spray SPRAY 1 SPRAY IN EACH NOSTRIL 2 TIMES A DAY  . furosemide (LASIX) 40 MG tablet Take 20 mg by mouth daily as needed.   Marland Kitchen levothyroxine (SYNTHROID, LEVOTHROID) 75 MCG tablet Take 1 tablet (75 mcg total) by mouth daily.  Marland Kitchen lisinopril (PRINIVIL,ZESTRIL) 20 MG tablet TAKE 1 TABLET (20 MG TOTAL) BY MOUTH DAILY.  . sotalol (BETAPACE) 80 MG tablet TAKE 1 TABLET BY MOUTH EVERY 12 HOURS  . warfarin (COUMADIN) 2.5 MG tablet Take as directed by [facility]  . [DISCONTINUED] sotalol (BETAPACE) 80 MG tablet Take 1 tablet (80 mg total) by mouth 2 (two) times daily.  Marland Kitchen amLODipine (NORVASC) 10 MG tablet Take 1 tablet (10 mg total) by mouth daily.  Marland Kitchen atorvastatin (LIPITOR) 80 MG tablet Take 80 mg by mouth daily.  . chlorthalidone (HYGROTON) 25 MG tablet Take 25 mg by mouth daily.  . ranitidine (ZANTAC) 300 MG tablet Take 300 mg by mouth at bedtime.  . terbinafine (LAMISIL AT) 1 % cream Apply 1 application topically 2 (two) times daily.  Marland Kitchen  traMADol (ULTRAM) 50 MG tablet Take 1 tablet (50 mg total) by mouth every 12 (twelve) hours as needed for moderate pain.   No facility-administered encounter medications on file as of 01/24/2017.     Activities of Daily Living In your present state of health, do you have any difficulty performing the following activities: 01/24/2017 10/26/2016  Hearing? N N  Vision? Y N  Difficulty concentrating or making decisions? N N  Walking or climbing stairs? N N  Comment SOB -  Dressing or bathing? Y N  Doing errands, shopping? Y N  Preparing Food and eating ? N -  Using the Toilet? N -  In the past six months, have you accidently leaked urine? N -  Do you have problems with loss of bowel control? N  -  Managing your Medications? N -  Managing your Finances? Y -  Housekeeping or managing your Housekeeping? N -  Some recent data might be hidden    Patient Care Team: Roselee Nova, MD as PCP - General (Family Medicine) Deboraha Sprang, MD as Consulting Physician (Cardiology)    Assessment:     Exercise Activities and Dietary recommendations Current Exercise Habits: The patient does not participate in regular exercise at present (walks a lot), Frequency (Times/Week): 7, Intensity: Mild, Exercise limited by: None identified  Goals    . Cut out extra servings          Recommend cutting out extra servings and snacking in between meals.       Fall Risk Fall Risk  01/24/2017 10/26/2016 05/08/2015 04/29/2015 12/10/2014  Falls in the past year? No No No No No   Depression Screen PHQ 2/9 Scores 01/24/2017 10/26/2016 05/08/2015 04/29/2015  PHQ - 2 Score 1 0 0 0     Cognitive Function- Pt declined screening today.        Immunization History  Administered Date(s) Administered  . Influenza, High Dose Seasonal PF 04/29/2015  . Pneumococcal Conjugate-13 04/29/2015  . Pneumococcal Polysaccharide-23 02/17/2011  . Tdap 04/28/2012   Screening Tests Health Maintenance  Topic Date Due  . FOOT EXAM  03/16/1956  . OPHTHALMOLOGY EXAM  03[birth year]16/1955  . MAMMOGRAM  03/16/1995  . HEMOGLOBIN A1C  10/27/2015  . INFLUENZA VACCINE  01/05/2017  . TETANUS/TDAP  04/28/2022  . COLONOSCOPY  08/31/2022  . DEXA SCAN  Completed  . Hepatitis C Screening  Completed  . PNA vac Low Risk Adult  Completed      Plan:  I have personally reviewed and addressed the Medicare Annual Wellness questionnaire and have noted the following in the patient's chart:  A. Medical and social history B. Use of alcohol, tobacco or illicit drugs  C. Current medications and supplements D. Functional ability and status E.  Nutritional status F.  Physical activity G. Advance directives H. List of other physicians I.    Hospitalizations, surgeries, and ER visits in previous 12 months J.  [place] such as hearing and vision if needed, cognitive and depression L. Referrals and appointments - none  In addition, I have reviewed and discussed with patient certain preventive protocols, quality metrics, and best practice recommendations. A written personalized care plan for preventive services as well as general preventive health recommendations were provided to patient.  See attached scanned questionnaire for additional information.   Signed,  Fabio Neighbors, LPN Nurse Health Advisor   MD Recommendations: Pt needs a diabetic foot exam and Hgb A1c at next OV. Daughter requesting a referral for a  mammogram (advised I cannot do this, only diagnostic referrals). Daughter to set up an eye exam by the end of this year.

## 2017-01-31 ENCOUNTER — Ambulatory Visit: Payer: Medicare Other | Admitting: Family Medicine

## 2017-02-02 ENCOUNTER — Ambulatory Visit (INDEPENDENT_AMBULATORY_CARE_PROVIDER_SITE_OTHER): Payer: Medicare Other

## 2017-02-02 DIAGNOSIS — I4891 Unspecified atrial fibrillation: Secondary | ICD-10-CM | POA: Diagnosis not present

## 2017-02-02 DIAGNOSIS — Z5181 Encounter for therapeutic drug level monitoring: Secondary | ICD-10-CM

## 2017-02-02 LAB — POCT INR: INR: 1.7

## 2017-02-08 ENCOUNTER — Ambulatory Visit: Payer: Medicare Other | Admitting: Family Medicine

## 2017-02-15 ENCOUNTER — Ambulatory Visit: Payer: Medicare Other | Admitting: Family Medicine

## 2017-02-17 ENCOUNTER — Ambulatory Visit: Payer: Medicare Other | Admitting: Family Medicine

## 2017-02-21 ENCOUNTER — Ambulatory Visit (INDEPENDENT_AMBULATORY_CARE_PROVIDER_SITE_OTHER): Payer: Medicare Other

## 2017-02-21 DIAGNOSIS — I4891 Unspecified atrial fibrillation: Secondary | ICD-10-CM | POA: Diagnosis not present

## 2017-02-21 DIAGNOSIS — Z5181 Encounter for therapeutic drug level monitoring: Secondary | ICD-10-CM

## 2017-02-21 LAB — POCT INR: INR: 4.1

## 2017-02-28 ENCOUNTER — Ambulatory Visit: Payer: Medicare Other | Admitting: Family Medicine

## 2017-03-07 ENCOUNTER — Ambulatory Visit (INDEPENDENT_AMBULATORY_CARE_PROVIDER_SITE_OTHER): Payer: Medicare Other

## 2017-03-07 DIAGNOSIS — I4891 Unspecified atrial fibrillation: Secondary | ICD-10-CM

## 2017-03-07 DIAGNOSIS — Z5181 Encounter for therapeutic drug level monitoring: Secondary | ICD-10-CM | POA: Diagnosis not present

## 2017-03-07 LAB — POCT INR: INR: 1.6

## 2017-03-09 ENCOUNTER — Ambulatory Visit: Payer: Medicare Other | Admitting: Family Medicine

## 2017-03-29 ENCOUNTER — Ambulatory Visit: Payer: Medicare Other | Admitting: Family Medicine

## 2017-03-30 ENCOUNTER — Ambulatory Visit (INDEPENDENT_AMBULATORY_CARE_PROVIDER_SITE_OTHER): Payer: Medicare Other

## 2017-03-30 DIAGNOSIS — Z5181 Encounter for therapeutic drug level monitoring: Secondary | ICD-10-CM

## 2017-03-30 DIAGNOSIS — I4891 Unspecified atrial fibrillation: Secondary | ICD-10-CM | POA: Diagnosis not present

## 2017-03-30 LAB — POCT INR: INR: 1.3

## 2017-04-08 ENCOUNTER — Ambulatory Visit: Payer: Self-pay | Admitting: *Deleted

## 2017-04-08 NOTE — Telephone Encounter (Signed)
Daughter reported patient's feet are sore to walk on and looks red. Ankles are not sore to touch and denies any calf pain.   Reason for Disposition . [1] MILD swelling of both ankles (i.e., pedal edema) AND [2] new onset or worsening  Answer Assessment - Initial Assessment Questions 1. LOCATION: "Which joint is swollen?"     Bilateral ankles 2. ONSET: "When did the swelling start?"     Last night 3. SIZE: "How large is the swelling?"    Minimal 4. PAIN: "Is there any pain?" If so, ask: "How bad is it?" (Scale 1-10; or mild, moderate, severe)     mild 5. CAUSE: "What do you think caused the swollen joint?"     Doesn't know 6. OTHER SYMPTOMS: "Do you have any other symptoms?" (e.g., fever, chest pain, difficulty breathing, calf pain)    no 7. PREGNANCY: "Is there any chance you are pregnant?" "When was your last menstrual period?"    no  Protocols used: LEG SWELLING AND EDEMA-A-AH, ANKLE SWELLING-A-AH

## 2017-04-11 ENCOUNTER — Telehealth: Payer: Self-pay | Admitting: Family Medicine

## 2017-04-11 ENCOUNTER — Ambulatory Visit (INDEPENDENT_AMBULATORY_CARE_PROVIDER_SITE_OTHER): Payer: Medicare Other | Admitting: Family Medicine

## 2017-04-11 ENCOUNTER — Encounter: Payer: Self-pay | Admitting: Family Medicine

## 2017-04-11 ENCOUNTER — Ambulatory Visit
Admission: RE | Admit: 2017-04-11 | Discharge: 2017-04-11 | Disposition: A | Discharge status: Home / Self Care | Payer: Medicare Other | Source: Ambulatory Visit | Attending: Family Medicine | Admitting: Family Medicine

## 2017-04-11 VITALS — BP 142/84 | HR 89 | Temp 98.0°F | Resp 16 | Ht 59.0 in | Wt 153.4 lb

## 2017-04-11 DIAGNOSIS — I10 Essential (primary) hypertension: Secondary | ICD-10-CM | POA: Diagnosis not present

## 2017-04-11 DIAGNOSIS — X58XXXA Exposure to other specified factors, initial encounter: Secondary | ICD-10-CM | POA: Insufficient documentation

## 2017-04-11 DIAGNOSIS — L03115 Cellulitis of right lower limb: Secondary | ICD-10-CM | POA: Diagnosis not present

## 2017-04-11 DIAGNOSIS — B351 Tinea unguium: Secondary | ICD-10-CM

## 2017-04-11 DIAGNOSIS — I5020 Unspecified systolic (congestive) heart failure: Secondary | ICD-10-CM

## 2017-04-11 DIAGNOSIS — S92512A Displaced fracture of proximal phalanx of left lesser toe(s), initial encounter for closed fracture: Secondary | ICD-10-CM | POA: Diagnosis not present

## 2017-04-11 DIAGNOSIS — I209 Angina pectoris, unspecified: Secondary | ICD-10-CM | POA: Insufficient documentation

## 2017-04-11 DIAGNOSIS — Z7901 Long term (current) use of anticoagulants: Secondary | ICD-10-CM | POA: Diagnosis not present

## 2017-04-11 DIAGNOSIS — M7989 Other specified soft tissue disorders: Secondary | ICD-10-CM | POA: Insufficient documentation

## 2017-04-11 MED ORDER — CLINDAMYCIN HCL 300 MG PO CAPS: 600.0000 mg | ORAL_CAPSULE | Freq: Two times a day (BID) | ORAL | 0 refills | Status: AC

## 2017-04-11 NOTE — Telephone Encounter (Signed)
Received call back from Felicia Poole with social services - she states that she simply needs verbal confirmation from Dr. Manuella Ghazi, PCP, that the patient is under the full time care of Felicia Poole.  Please call Felicia Poole back to confirm/deny this information - [phone] - you may leave a message if needed.  I sent the following to you earlier today as a staff message in explanation:  - Felicia Poole, Daughter of patient, requests a call to social services by PCP Dr. Manuella Ghazi.  Felicia Poole is applying for food stamps, and must perform community service to obtain these. She is also the sole caregiver for her mother who she believes requires 24/7 care.  She would like for Dr. Manuella Ghazi to provide verbal or written excuse stating that Felicia Poole is unable to provide community service because she is providing care to her mother.   - She is working with Felicia Poole, Social Services Intake, who can be reached at [phone].   I took the liberty of calling Felicia Poole, she did not answer, and I left a message for her to call our clinic.  I have sent a staff message via epic with this documentation copied to Dr. Manuella Ghazi

## 2017-04-11 NOTE — Progress Notes (Addendum)
Name: Felicia Poole   MRN: [id number]    DOB: 09-Nov-1945   Date:04/11/2017       Progress Note  Subjective  Chief Complaint  Chief Complaint  Patient presents with  . Foot Swelling    painful, was red    HPI  RIGHT LE swelling: Patient has chronic BLE edema which is normally well controlled with Lasix.  Endorses shortness of breath - no change from baseline.  4-5 days ago the RIGHT ankle began to swelling; did have some erythema a few days ago, but this has been slowly coming down; no recent injury.  No calf pain or redness, no chest pain; no recent travel or prolonged period of immobility.  - Dr. Manuella Ghazi PCP ordered US Venous bilateral  10/26/2016 and this was negative for DVT.  He also ordered Xrays of bilateral feet which she has not had performed.  Lastly, he had labs performed - Sefd rate was negative, uric acid was 5.0, CBC and CMP were non-contributory. - Onychomycosis: Patient has thickened, discolored toenails for many years.  Daughter requests referral to podiatry because she is having trouble filing/cutting nails.   Tilford Pillar, Daughter of patient, requests a call to social services by PCP Dr. Manuella Ghazi.  Ms. Huston Foley is applying for food stamps, and must perform community service to obtain these. She is also the sole caregiver for her mother who she believes requires 24/7 care.  She would like for Dr. Manuella Ghazi to provide excuse letter stating that Ms. Huston Foley is unable to provide community service because she is providing care to her mother.  She is working with Melynda Ripple, Social Services Intake, who can be reached at [phone].   I took the liberty of calling Ms. Daniel Nones, she did not answer, and I left a message for her to call our clinic.  I have sent a staff message via epic with this documentation copied to Dr. Manuella Ghazi.  Patient Active Problem List   Diagnosis Date Noted  . Diabetes mellitus ([place]) 05/08/2015  . 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 Easy  bruisability 04/29/2015  . Chronic anticoagulation 04/29/2015  . Adiposity 12/10/2014  . Obesity 12/10/2014  . Accumulation of fluid in tissues 10/15/2013  . Hypercholesterolemia 10/15/2013  . Encounter for therapeutic drug monitoring 08/08/2013  . Pacemaker -Medtronic 05/24/2013  . Tachy-brady syndrome ([place]) 03/02/2013  . Nonischemic cardiomyopathy ([place])   . Atrial fibrillation ([place]) 02/28/2013  . Other malaise 02/26/2013  . Arteriosclerosis of coronary artery 02/22/2013  . BP (high blood pressure) 02/22/2013  . Adult hypothyroidism 02/22/2013  . Congestive heart failure with left ventricular systolic dysfunction ([place]) 02/22/2013  . Type 2 diabetes mellitus ([place]) 02/22/2013  . 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 Essential hypertension, benign   . Atrial myxoma   . Coronary atherosclerosis     Social History   Tobacco Use  . Smoking status: Never Smoker  . Smokeless tobacco: Never Used  Substance Use Topics  . Alcohol use: No     Current Outpatient Medications:  .  amLODipine (NORVASC) 10 MG tablet, Take 1 tablet (10 mg total) by mouth daily., Disp: 90 tablet, Rfl: 0 .  atorvastatin (LIPITOR) 80 MG tablet, Take 80 mg by mouth daily., Disp: , Rfl:  .  chlorthalidone (HYGROTON) 25 MG tablet, Take 25 mg by mouth daily., Disp: , Rfl:  .  fluticasone (FLONASE) 50 MCG/ACT nasal spray, SPRAY 1 SPRAY IN EACH NOSTRIL 2 TIMES A DAY, Disp: 16 g, Rfl: 0 .  furosemide (LASIX)  40 MG tablet, Take 20 mg by mouth daily as needed. , Disp: , Rfl:  .  levothyroxine (SYNTHROID, LEVOTHROID) 75 MCG tablet, Take 1 tablet (75 mcg total) by mouth daily., Disp: 90 tablet, Rfl: 0 .  lisinopril (PRINIVIL,ZESTRIL) 20 MG tablet, TAKE 1 TABLET (20 MG TOTAL) BY MOUTH DAILY., Disp: 90 tablet, Rfl: 0 .  ranitidine (ZANTAC) 300 MG tablet, Take 300 mg by mouth at bedtime., Disp: , Rfl:  .  sotalol (BETAPACE) 80 MG tablet, TAKE 1 TABLET BY MOUTH EVERY 12 HOURS, Disp: 180 tablet, Rfl: 3 .  terbinafine  (LAMISIL AT) 1 % cream, Apply 1 application topically 2 (two) times daily., Disp: 30 g, Rfl: 0 .  traMADol (ULTRAM) 50 MG tablet, Take 1 tablet (50 mg total) by mouth every 12 (twelve) hours as needed for moderate pain., Disp: 10 tablet, Rfl: 0 .  warfarin (COUMADIN) 2.5 MG tablet, Take as directed by Coumadin Clinic, Disp: 65 tablet, Rfl: 3  No Known Allergies  ROS  Ten systems reviewed and is negative except as mentioned in HPI  Objective  Vitals:   04/11/17 1118  BP: (!) 142/84  Pulse: 89  Resp: 16  Temp: 98 F (36.7 C)  TempSrc: Oral  SpO2: 95%  Weight: 153 lb 6.4 oz (69.6 kg)  Height: 4\' 11"  (1.499 m)   Body mass index is 30.98 kg/m.  Nursing Note and Vital Signs reviewed.  Physical Exam  Constitutional: Patient appears well-developed and well-nourished. Obese No distress.  HEENT: head atraumatic, normocephalic Cardiovascular: Normal rate, regular rhythm, S1/S2 present.  No murmur or rub heard. No BLE edema. Pulmonary/Chest: Effort normal and breath sounds clear. No respiratory distress or retractions. Psychiatric: Patient has a normal mood and affect. behavior is normal. Judgment and thought content normal. MSK: Pedal pulses +2 bilaterally. RIGHT medial ankle has moderate non-pitting edema, is moderately erythematous, and is tender to palpation.  No red streaking, no calf tenderness/redness. Skin: No rash. No heat; moderate erythema over right medial ankle.  Onychomycosis present to bilateral toenails - non-tender.  Recent Results (from the past 2160 hour(s))  POCT INR     Status: None   Collection Time: 01/19/17  1:05 PM  Result Value Ref Range   INR 1.5   POCT INR     Status: None   Collection Time: 02/02/17 12:57 PM  Result Value Ref Range   INR 1.7   POCT INR     Status: None   Collection Time: 02/21/17  2:03 PM  Result Value Ref Range   INR 4.1   POCT INR     Status: None   Collection Time: 03/07/17  3:47 PM  Result Value Ref Range   INR 1.6   POCT  INR     Status: None   Collection Time: 03/30/17 11:16 AM  Result Value Ref Range   INR 1.3      Assessment & Plan  1. Cellulitis of right lower extremity - clindamycin (CLEOCIN) 300 MG capsule; Take 2 capsules (600 mg total) 2 (two) times daily for 7 days by mouth.  Dispense: 28 capsule; Refill: 0 - Well's Criteria for DVT Score is a -2 - no indication for [place] today. - Advised patient should go for bilateral foot Xrays previously ordered by Dr. Manuella Ghazi. - If not improving in 2-3 days, pt/pt's daughter will call to schedule appointment to re-evaluation, and we will consider performing labs and/or additional imaging at that time.  2. Onychomycosis of toenail - Ambulatory referral to  Podiatry  3. Congestive heart failure with left ventricular systolic dysfunction ([place]) - Advised she continue medications as prescribed, and that this is unlikely to be related to her CHF as it is unilateral and accompanied by erythema.  4. Essential hypertension Advised she continue her daily medications as prescribed, and that this is unlikely to be related to her taking Amlodipine as it is unilateral and accompanied by erythema.   - BP mildly elevated today, close follow up in 2 weeks is recommended with PCP for continued monitoring.  5. Chronic anticoagulation Chronic use of Warfarin is taken into consideration during antibiotic selection - Clindamycin has no stated interactions, so we will use this first line.   - Return for 2-3 days if not improving; 2week follow up w/ Dr. Manuella Ghazi.  -Red flags and when to present for emergency care or RTC including fever >101.67F, chest pain, shortness of breath, new/worsening/un-resolving symptoms, reviewed with patient at time of visit. Follow up and care instructions discussed and provided in AVS.  - Plan of care discussed with Dr. Manuella Ghazi PCP and he is in agreement.

## 2017-04-11 NOTE — Patient Instructions (Addendum)

## 2017-04-11 NOTE — Telephone Encounter (Signed)
I cannot provide any verbal confirmation until I see them face to face and have the chance to ask appropriate questions. Please schedule for appointment in 2 weeks.

## 2017-04-12 ENCOUNTER — Telehealth: Payer: Self-pay

## 2017-04-12 NOTE — Telephone Encounter (Signed)
-----   Message from Roselee Nova, MD sent at 04/11/2017  7:38 PM EST ----- Regarding: FW: Please see note below Reviewed message from Raquel Sarna, NP. Will wait for Melynda Ripple to return Emily's call and discuss at that time. She will likely need an appointment to discuss her situation and concerns.  ----- Message ----- From: Hubbard Hartshorn, FNP Sent: 04/11/2017  11:33 AM To: Roselee Nova, MD Subject: Please see note below                          Tilford Pillar, Daughter of Ms. Cummings, requests a call to social services by PCP Dr. Manuella Ghazi.  Ms. Huston Foley is applying for food stamps, and must perform community service to obtain these. She is also the sole caregiver for her mother who she believes requires 24/7 care.  She would like for Dr. Manuella Ghazi to provide excuse letter stating that Ms. Huston Foley is unable to provide community service because she is providing care to her mother.  She is working with Melynda Ripple, Social Services Intake, who can be reached at [phone].  I took the liberty of calling Ms. Daniel Nones, she did not answer, and I left a message for her to call our clinic.  Thanks, Raquel Sarna

## 2017-04-12 NOTE — Telephone Encounter (Signed)
Spoke with daughter [name]) and they already have appt for this month and they will wait until then.

## 2017-04-13 ENCOUNTER — Ambulatory Visit (INDEPENDENT_AMBULATORY_CARE_PROVIDER_SITE_OTHER): Payer: Medicare Other

## 2017-04-13 DIAGNOSIS — I4891 Unspecified atrial fibrillation: Secondary | ICD-10-CM | POA: Diagnosis not present

## 2017-04-13 DIAGNOSIS — Z5181 Encounter for therapeutic drug level monitoring: Secondary | ICD-10-CM

## 2017-04-13 LAB — POCT INR: INR: 1.6

## 2017-04-18 ENCOUNTER — Other Ambulatory Visit: Payer: Self-pay

## 2017-04-18 MED ORDER — WARFARIN SODIUM 2.5 MG PO TABS: ORAL_TABLET | ORAL | 1 refills | Status: DC

## 2017-04-25 ENCOUNTER — Ambulatory Visit: Payer: Medicare Other | Admitting: Family Medicine

## 2017-04-27 ENCOUNTER — Ambulatory Visit (INDEPENDENT_AMBULATORY_CARE_PROVIDER_SITE_OTHER): Payer: Medicare Other

## 2017-04-27 DIAGNOSIS — I4891 Unspecified atrial fibrillation: Secondary | ICD-10-CM | POA: Diagnosis not present

## 2017-04-27 DIAGNOSIS — Z5181 Encounter for therapeutic drug level monitoring: Secondary | ICD-10-CM | POA: Diagnosis not present

## 2017-04-27 LAB — POCT INR: INR: 1.9

## 2017-04-27 MED ORDER — WARFARIN SODIUM 2.5 MG PO TABS: ORAL_TABLET | ORAL | 1 refills | Status: DC

## 2017-04-27 NOTE — Addendum Note (Signed)
Addended by: Dede Query R on: 04/27/2017 01:01 PM   Modules accepted: Orders

## 2017-04-27 NOTE — Patient Instructions (Signed)
Start new dosage of 3 pills everyday, except 2 on Mondays and Fridays. Recheck INR in 2 weeks.

## 2017-05-02 ENCOUNTER — Ambulatory Visit (INDEPENDENT_AMBULATORY_CARE_PROVIDER_SITE_OTHER): Payer: Medicare Other | Admitting: Podiatry

## 2017-05-02 ENCOUNTER — Encounter: Payer: Self-pay | Admitting: Podiatry

## 2017-05-02 ENCOUNTER — Ambulatory Visit: Payer: Self-pay | Admitting: *Deleted

## 2017-05-02 DIAGNOSIS — M79675 Pain in left toe(s): Secondary | ICD-10-CM

## 2017-05-02 DIAGNOSIS — B351 Tinea unguium: Secondary | ICD-10-CM | POA: Diagnosis not present

## 2017-05-02 DIAGNOSIS — M76821 Posterior tibial tendinitis, right leg: Secondary | ICD-10-CM | POA: Diagnosis not present

## 2017-05-02 DIAGNOSIS — D689 Coagulation defect, unspecified: Secondary | ICD-10-CM

## 2017-05-02 DIAGNOSIS — M79674 Pain in right toe(s): Secondary | ICD-10-CM | POA: Diagnosis not present

## 2017-05-02 MED ORDER — MELOXICAM 15 MG PO TABS: 15.0000 mg | ORAL_TABLET | Freq: Every day | ORAL | 3 refills | Status: DC

## 2017-05-02 NOTE — Telephone Encounter (Signed)
Caregiver  Reports   Foul  Smelling  Rash    beltline  Area     With  Symptoms  X  2   Weeks . Has   Been   Applying  Unknown  Powder  To  The  Area     Reason for Disposition . Localized rash present > 7 days  Answer Assessment - Initial Assessment Questions 1. APPEARANCE of RASH: "Describe the rash."       Red  Long     2. LOCATION: "Where is the rash located?"        Along  beltline   On  Stomach    3. NUMBER: "How many spots are there?"       Long  Line    With  Whitish   Area    4. SIZE: "How big are the spots?" (Inches, centimeters or compare to size of a coin)        Long    3-4  Inches   Long   5. ONSET: "When did the rash start?"       Couple   Weeks    6. ITCHING: "Does the rash itch?" If so, ask: "How bad is the itch?"  (Scale 1-10; or mild, moderate, severe)     Iches   7. PAIN: "Does the rash hurt?" If so, ask: "How bad is the pain?"  (Scale 1-10; or mild, moderate, severe)     Burning  Pain   Scale  Of  7    8. OTHER SYMPTOMS: "Do you have any other symptoms?" (e.g., fever)     Foul  Odor   9. PREGNANCY: "Is there any chance you are pregnant?" "When was your last menstrual period?"     No  Protocols used: RASH OR REDNESS - LOCALIZED-A-AH

## 2017-05-02 NOTE — Progress Notes (Signed)
   Subjective:    Patient ID: Felicia Poole, female    DOB: [birth year], 71 y.o.   MRN: [id number]  HPIthis patient presents the office with chief complaint of long thick painful nails.  She states that the nails are painful walking and wearing her shoes.  She was referred to this office by Dr. Manuella Ghazi.  She has a history of having injured her left foot and recently injuring her right ankle. She says that she is having swelling in her right ankle and points to the inside of her right ankle as the site of most pain.  She says she has provided no self treatment nor sought any professional help.  She presents the office today for an evaluation and treatment of her diabetic feet.  Patient has been diagnosed with type 2 diabetes, but has stopped taking her metformin. Patient says she takes coumadin.     Review of Systems  Musculoskeletal: Positive for arthralgias, back pain and gait problem.  All other systems reviewed and are negative.      Objective:   Physical Exam General Appearance  Alert, conversant and in no acute stress.  Vascular  Dorsalis pedis pulses are palpable  bilaterally.  Absent posterior tibial pulses due to swelling ankles bilateral. Capillary return is within normal limits  Bilaterally. Temperature is within normal limits  Bilaterally  Neurologic  Senn-Weinstein monofilament wire test within normal limits  bilaterally. Muscle power  Within normal limits bilaterally.  Nails Thick disfigured discolored nails with subungual debris  bilaterally from hallux to fifth toes bilaterally. No evidence of bacterial infection or drainage bilaterally.  Orthopedic  No limitations of motion of motion feet bilaterally.  No crepitus or effusions noted.  HAV  B/L. Palpable pain along the course of the posterior tibial tendon of the right foot under the medial malleolus.  Skin  normotropic skin with no porokeratosis noted bilaterally.  No signs of infections or ulcers noted.            Assessment & Plan:  Onychomycosis  B/L  Diabetes with no complications  PTTD right ankle.  IE  Examined her previous x-rays which do reveal a fracture to the fifth toe of the left foot.  There is no noticeable bony pathology noted to the right foot.  Debridement  and grinding of long thick painful nails.  Compression anklet was dispensed to be worn on her right ankle.  RTC 18months  Gardiner Barefoot DPM

## 2017-05-03 ENCOUNTER — Encounter: Payer: Self-pay | Admitting: Family Medicine

## 2017-05-03 ENCOUNTER — Ambulatory Visit (INDEPENDENT_AMBULATORY_CARE_PROVIDER_SITE_OTHER): Payer: Medicare Other | Admitting: Family Medicine

## 2017-05-03 VITALS — BP 136/78 | HR 83 | Temp 97.4°F | Resp 16 | Ht 59.0 in | Wt 153.7 lb

## 2017-05-03 DIAGNOSIS — B372 Candidiasis of skin and nail: Secondary | ICD-10-CM | POA: Diagnosis not present

## 2017-05-03 DIAGNOSIS — R8290 Unspecified abnormal findings in urine: Secondary | ICD-10-CM | POA: Diagnosis not present

## 2017-05-03 DIAGNOSIS — N3946 Mixed incontinence: Secondary | ICD-10-CM

## 2017-05-03 DIAGNOSIS — H1011 Acute atopic conjunctivitis, right eye: Secondary | ICD-10-CM | POA: Diagnosis not present

## 2017-05-03 LAB — POCT URINALYSIS DIPSTICK
Bilirubin, UA: NEGATIVE
Blood, UA: NEGATIVE
Glucose, UA: NEGATIVE
Ketones, UA: NEGATIVE
Nitrite, UA: NEGATIVE
Protein, UA: NEGATIVE
Spec Grav, UA: 1.02 (ref 1.010–1.025)
Urobilinogen, UA: 1 U/dL
pH, UA: 6 (ref 5.0–8.0)

## 2017-05-03 MED ORDER — NYSTATIN 100000 UNIT/GM EX POWD: Freq: Two times a day (BID) | CUTANEOUS | 0 refills | Status: DC

## 2017-05-03 MED ORDER — OLOPATADINE HCL 0.1 % OP SOLN: 1.0000 [drp] | Freq: Two times a day (BID) | OPHTHALMIC | 3 refills | Status: DC

## 2017-05-03 MED ORDER — MIRABEGRON ER 25 MG PO TB24: 25.0000 mg | ORAL_TABLET | Freq: Every day | ORAL | 0 refills | Status: DC

## 2017-05-03 NOTE — Patient Instructions (Signed)
Skin Yeast Infection Skin yeast infection is a condition in which there is an overgrowth of yeast (candida) that normally lives on the skin. This condition usually occurs in areas of the skin that are constantly warm and moist, such as the armpits or the groin. What are the causes? This condition is caused by a change in the normal balance of the yeast and bacteria that live on the skin. What increases the risk? This condition is more likely to develop in:  People who are obese.  Pregnant women.  Women who take birth control pills.  People who have diabetes.  People who take antibiotic medicines.  People who take steroid medicines.  People who are malnourished.  People who have a weak defense (immune) system.  People who are 81 years of age or older.  What are the signs or symptoms? Symptoms of this condition include:  A red, swollen area of the skin.  Bumps on the skin.  Itchiness.  How is this diagnosed? This condition is diagnosed with a medical history and physical exam. Your health care provider may check for yeast by taking light scrapings of the skin to be viewed under a microscope. How is this treated? This condition is treated with medicine. Medicines may be prescribed or be available over-the-counter. The medicines may be:  Taken by mouth (orally).  Applied as a cream.  Follow these instructions at home:  Take or apply over-the-counter and prescription medicines only as told by your health care provider.  Eat more yogurt. This may help to keep your yeast infection from returning.  Maintain a healthy weight. If you need help losing weight, talk with your health care provider.  Keep your skin clean and dry.  If you have diabetes, keep your blood sugar under control. Contact a health care provider if:  Your symptoms go away and then return.  Your symptoms do not get better with treatment.  Your symptoms get worse.  Your rash spreads.  You have a  fever or chills.  You have new symptoms.  You have new warmth or redness of your skin. This information is not intended to replace advice given to you by your health care provider. Make sure you discuss any questions you have with your health care provider. Document Released: 02/09/2011 Document Revised: 01/18/2016 Document Reviewed: 11/25/2014 Elsevier Interactive Patient Education  2018 [employer].  Allergic Conjunctivitis A clear membrane (conjunctiva) covers the white part of your eye and the inner surface of your eyelid. Allergic conjunctivitis happens when this membrane has inflammation. This is caused by allergies. Common causes of allergic reactions (allergens)include:  Outdoor allergens, such as: ? Pollen. ? Grass and weeds. ? Mold spores.  Indoor allergens, such as: ? Dust. ? Smoke. ? Mold. ? Pet dander. ? Animal hair.  This condition can make your eye red or pink. It can also make your eye feel itchy. This condition cannot be spread from one person to another person (is not contagious). Follow these instructions at home:  Try not to be around things that you are allergic to.  Take or apply over-the-counter and prescription medicines only as told by your doctor. These include any eye drops.  Place a cool, clean washcloth on your eye for 10-20 minutes. Do this 3-4 times a day.  Do not touch or rub your eyes.  Do not wear contact lenses until the inflammation is gone. Wear glasses instead.  Do not wear eye makeup until the inflammation is gone.  Keep all  follow-up visits as told by your doctor. This is important. Contact a doctor if:  Your symptoms get worse.  Your symptoms do not get better with treatment.  You have mild eye pain.  You are sensitive to light,  You have spots or blisters on your eyes.  You have pus coming from your eye.  You have a fever. Get help right away if:  You have redness, swelling, or other symptoms in only one eye.  Your  vision is blurry.  You have vision changes.  You have very bad eye pain. Summary  Allergic conjunctivitis is caused by allergies. It can make your eye red or pink, and it can make your eye feel itchy.  This condition cannot be spread from one person to another person (is not contagious).  Try not to be around things that you are allergic to.  Take or apply over-the-counter and prescription medicines only as told by your doctor. These include any eye drops.  Contact your doctor if your symptoms get worse or they do not get better with treatment. This information is not intended to replace advice given to you by your health care provider. Make sure you discuss any questions you have with your health care provider. Document Released: 11/11/2009 Document Revised: 01/16/2016 Document Reviewed: 01/16/2016 Elsevier Interactive Patient Education  2017 [employer] Urinary Incontinence Urinary incontinence is the involuntary loss of urine from your bladder. What are the causes? There are many causes of urinary incontinence. They include:  Medicines.  Infections.  Prostatic enlargement, leading to overflow of urine from your bladder.  Surgery.  Neurological diseases.  Emotional factors.  What are the signs or symptoms? Urinary Incontinence can be divided into four types: 1. Urge incontinence. Urge incontinence is the involuntary loss of urine before you have the opportunity to go to the bathroom. There is a sudden urge to void but not enough time to reach a bathroom. 2. Stress incontinence. Stress incontinence is the sudden loss of urine with any activity that forces urine to pass. It is commonly caused by anatomical changes to the pelvis and sphincter areas of your body. 3. Overflow incontinence. Overflow incontinence is the loss of urine from an obstructed opening to your bladder. This results in a backup of urine and a resultant buildup of pressure within the bladder. When the  pressure within the bladder exceeds the closing pressure of the sphincter, the urine overflows, which causes incontinence, similar to water overflowing a dam. 4. Total incontinence. Total incontinence is the loss of urine as a result of the inability to store urine within your bladder.  How is this diagnosed? Evaluating the cause of incontinence may require:  A thorough and complete medical and obstetric history.  A complete physical exam.  Laboratory tests such as a urine culture and sensitivities.  When additional tests are indicated, they can include:  An ultrasound exam.  Kidney and bladder X-rays.  Cystoscopy. This is an exam of the bladder using a narrow scope.  Urodynamic testing to test the nerve function to the bladder and sphincter areas.  How is this treated? Treatment for urinary incontinence depends on the cause:  For urge incontinence caused by a bacterial infection, antibiotics will be prescribed. If the urge incontinence is related to medicines you take, your health care provider may have you change the medicine.  For stress incontinence, surgery to re-establish anatomical support to the bladder or sphincter, or both, will often correct the condition.  For overflow incontinence caused by  an enlarged prostate, an operation to open the channel through the enlarged prostate will allow the flow of urine out of the bladder. In women with fibroids, a hysterectomy may be recommended.  For total incontinence, surgery on your urinary sphincter may help. An artificial urinary sphincter (an inflatable cuff placed around the urethra) may be required. In women who have developed a hole-like passage between their bladder and vagina (vesicovaginal fistula), surgery to close the fistula often is required.  Follow these instructions at home:  Normal daily hygiene and the use of pads or adult diapers that are changed regularly will help prevent odors and skin damage.  Avoid  caffeine. It can overstimulate your bladder.  Use the bathroom regularly. Try about every 2-3 hours to go to the bathroom, even if you do not feel the need to do so. Take time to empty your bladder completely. After urinating, wait a minute. Then try to urinate again.  For causes involving nerve dysfunction, keep a log of the medicines you take and a journal of the times you go to the bathroom. Contact a health care provider if:  You experience worsening of pain instead of improvement in pain after your procedure.  Your incontinence becomes worse instead of better. Get help right away if:  You experience fever or shaking chills.  You are unable to pass your urine.  You have redness spreading into your groin or down into your thighs. This information is not intended to replace advice given to you by your health care provider. Make sure you discuss any questions you have with your health care provider. Document Released: 07/01/2004 Document Revised: 01/02/2016 Document Reviewed: 10/31/2012 Elsevier Interactive Patient Education  [employer].

## 2017-05-03 NOTE — Progress Notes (Signed)
Name: Felicia Poole   MRN: [id number]    DOB: 1945/07/18   Date:05/03/2017       Progress Note  Subjective  Chief Complaint  Chief Complaint  Patient presents with  . Rash    patient presents with rash across her the lower part of her abdomen. area is discolored and moist. daughter stated that it was smelling on yesterday. patient has tried some powders. she stated that she warm shower but did not put any soap on it.  . Eye Problem    right eye watering. patient stated that she had a sty last week.  . rx    patient is needing a ex for liners or depends for night time    HPI  Rash: Patient presents with rash x2 weeks under panus on the right side.  She endorses itching and mild pain. She has been using a friend's nystatin powder for 3 days and it has been improving significantly, would like an Rx for this today.  Denies fevers or chills, no drainage or bleeding.  Allergic Conjunctivitis: RIGHT eye watering and red, worse when her allergies are acting up (chronic issue); she denies purulent exudate, no vision changes. Denies nasal congestion, sore throat, ear pain/pressure, sinus pain/pressure.  Incontinence: Ongoing issue with stress and urge incontinence for several years. She notes recent back pain for a few days. She notes she always has foul smelling urine. No abdominal pain, NVD, dysuria, hematuria, fevers, or chills. No history of kidney stones.  Patient Active Problem List   Diagnosis Date Noted  . Angina pectoris ([place]) 04/11/2017  . Diabetes mellitus ([place]) 05/08/2015  . 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 Easy bruisability 04/29/2015  . Chronic anticoagulation 04/29/2015  . Adiposity 12/10/2014  . Obesity 12/10/2014  . Accumulation of fluid in tissues 10/15/2013  . Hypercholesterolemia 10/15/2013  . Encounter for therapeutic drug monitoring 08/08/2013  . Pacemaker -Medtronic 05/24/2013  . Tachy-brady syndrome ([place]) 03/02/2013  . Nonischemic  cardiomyopathy ([place])   . Atrial fibrillation ([place]) 02/28/2013  . Other malaise 02/26/2013  . Arteriosclerosis of coronary artery 02/22/2013  . BP (high blood pressure) 02/22/2013  . Adult hypothyroidism 02/22/2013  . Congestive heart failure with left ventricular systolic dysfunction ([place]) 02/22/2013  . Type 2 diabetes mellitus ([place]) 02/22/2013  . Tachycardia-bradycardia ([place]) 02/22/2013  . Essential hypertension, benign   . Atrial myxoma   . Coronary atherosclerosis     Social History   Tobacco Use  . Smoking status: Never Smoker  . Smokeless tobacco: Never Used  Substance Use Topics  . Alcohol use: No     Current Outpatient Medications:  .  alendronate (FOSAMAX) 70 MG tablet, Take 70 mg by mouth., Disp: , Rfl:  .  amLODipine (NORVASC) 10 MG tablet, Take 1 tablet (10 mg total) by mouth daily., Disp: 90 tablet, Rfl: 0 .  aspirin 81 MG chewable tablet, Chew 81 mg by mouth., Disp: , Rfl:  .  atorvastatin (LIPITOR) 80 MG tablet, Take 80 mg by mouth., Disp: , Rfl:  .  Calcium Carb-Cholecalciferol (CALCIUM-VITAMIN D) 500-200 MG-UNIT tablet, Take by mouth., Disp: , Rfl:  .  chlorthalidone (HYGROTON) 25 MG tablet, Take 25 mg by mouth daily., Disp: , Rfl:  .  fluticasone (FLONASE) 50 MCG/ACT nasal spray, SPRAY 1 SPRAY IN EACH NOSTRIL 2 TIMES A DAY, Disp: 16 g, Rfl: 0 .  furosemide (LASIX) 40 MG tablet, Take 20 mg by mouth., Disp: , Rfl:  .  levothyroxine (SYNTHROID, LEVOTHROID) 75 MCG tablet,  Take 1 tablet (75 mcg total) by mouth daily., Disp: 90 tablet, Rfl: 0 .  lisinopril (PRINIVIL,ZESTRIL) 20 MG tablet, TAKE 1 TABLET (20 MG TOTAL) BY MOUTH DAILY., Disp: 90 tablet, Rfl: 0 .  lisinopril (PRINIVIL,ZESTRIL) 5 MG tablet, Take 5 mg by mouth., Disp: , Rfl:  .  meloxicam (MOBIC) 15 MG tablet, Take 1 tablet (15 mg total) by mouth daily., Disp: 30 tablet, Rfl: 3 .  mirabegron ER (MYRBETRIQ) 25 MG TB24 tablet, Take 1 tablet (25 mg total) by mouth daily., Disp: 30 tablet, Rfl: 0 .  nystatin  (MYCOSTATIN/NYSTOP) powder, Apply topically 2 (two) times daily., Disp: 45 g, Rfl: 0 .  olopatadine (PATANOL) 0.1 % ophthalmic solution, Place 1 drop into both eyes 2 (two) times daily., Disp: 5 mL, Rfl: 3 .  ranitidine (ZANTAC) 300 MG tablet, Take 300 mg by mouth., Disp: , Rfl:  .  sotalol (BETAPACE) 80 MG tablet, Take 80 mg by mouth., Disp: , Rfl:  .  terbinafine (LAMISIL AT) 1 % cream, Apply 1 application topically 2 (two) times daily., Disp: 30 g, Rfl: 0 .  traMADol (ULTRAM) 50 MG tablet, Take 1 tablet (50 mg total) by mouth every 12 (twelve) hours as needed for moderate pain., Disp: 10 tablet, Rfl: 0 .  Vitamin D, Ergocalciferol, (DRISDOL) 50000 units CAPS capsule, Take by mouth., Disp: , Rfl:  .  warfarin (COUMADIN) 2.5 MG tablet, Take as directed by [facility], Disp: 65 tablet, Rfl: 1  No Known Allergies  ROS  Ten systems reviewed and is negative except as mentioned in HPI  Objective  Vitals:   05/03/17 1218  BP: 136/78  Pulse: 83  Resp: 16  Temp: (!) 97.4 F (36.3 C)  TempSrc: Oral  SpO2: 99%  Weight: 153 lb 11.2 oz (69.7 kg)  Height: 4\' 11"  (1.499 m)   Body mass index is 31.04 kg/m.  Nursing Note and Vital Signs reviewed.  Physical Exam  Constitutional: Patient appears well-developed and well-nourished. No distress.  HEENT: head atraumatic, normocephalic, pupils equal and reactive to light, EOM's intact, conjunctiva to the RIGHT eye is mildly erythematous and with small amount of tears present.  Neck supple without lymphadenopathy, oropharynx pink and moist without exudate Cardiovascular: Normal rate, regular rhythm, S1/S2 present.  No murmur or rub heard. No BLE edema. Pulmonary/Chest: Effort normal and breath sounds clear. No respiratory distress or retractions. Abdominal: Soft and non-tender, bowel sounds present x4 quadrants.  No CVA Tenderness  Psychiatric: Patient has a normal mood and affect. behavior is normal. Judgment and thought content normal. Skin:  Moderately sized ovoid area of erythema with white scaling, non-tender to palpation.   Recent Results (from the past 2160 hour(s))  POCT INR     Status: None   Collection Time: 02/21/17  2:03 PM  Result Value Ref Range   INR 4.1   POCT INR     Status: None   Collection Time: 03/07/17  3:47 PM  Result Value Ref Range   INR 1.6   POCT INR     Status: None   Collection Time: 03/30/17 11:16 AM  Result Value Ref Range   INR 1.3   POCT INR     Status: None   Collection Time: 04/13/17 11:04 AM  Result Value Ref Range   INR 1.6   POCT INR     Status: None   Collection Time: 04/27/17 11:28 AM  Result Value Ref Range   INR 1.9   POCT urinalysis dipstick  Status: Abnormal   Collection Time: 05/03/17 12:56 PM  Result Value Ref Range   Color, UA dark    Clarity, UA cloudy    Glucose, UA negative    Bilirubin, UA negative    Ketones, UA negative    Spec Grav, UA 1.020 1.010 - 1.025   Blood, UA negative    pH, UA 6.0 5.0 - 8.0   Protein, UA negative    Urobilinogen, UA 1.0 0.2 or 1.0 E.U./dL   Nitrite, UA negative    Leukocytes, UA Trace (A) Negative     Assessment & Plan  1. Candidiasis, intertrigo - nystatin (MYCOSTATIN/NYSTOP) powder; Apply topically 2 (two) times daily.  Dispense: 45 g; Refill: 0 - Advised to ensure excellent hygiene and to dry area completely after bathing.  2. Allergic conjunctivitis of right eye - olopatadine (PATANOL) 0.1 % ophthalmic solution; Place 1 drop into both eyes 2 (two) times daily.  Dispense: 5 mL; Refill: 3  3. Mixed incontinence urge and stress - Ambulatory referral to Physical Therapy - mirabegron ER (MYRBETRIQ) 25 MG TB24 tablet; Take 1 tablet (25 mg total) by mouth daily.  Dispense: 30 tablet; Refill: 0 - Patient and daughter decline urology referral today, but will try pelvic floor PT.  4. Foul smelling urine - POCT urinalysis dipstick - trace leukocytes only, will send for culture to determine if treatment is needed. - Urine  Culture - Drink plenty of fluids to stay hydrated  -Red flags and when to present for emergency care or RTC including fever >101.2F, chest pain, shortness of breath, new/worsening/un-resolving symptoms, reviewed with patient at time of visit. Follow up and care instructions discussed and provided in AVS.

## 2017-05-04 LAB — URINE CULTURE
MICRO NUMBER:: 81331447
SPECIMEN QUALITY:: ADEQUATE

## 2017-05-09 ENCOUNTER — Ambulatory Visit: Payer: Medicare Other

## 2017-05-11 ENCOUNTER — Ambulatory Visit (INDEPENDENT_AMBULATORY_CARE_PROVIDER_SITE_OTHER): Payer: Medicare Other

## 2017-05-11 DIAGNOSIS — Z5181 Encounter for therapeutic drug level monitoring: Secondary | ICD-10-CM

## 2017-05-11 DIAGNOSIS — I4891 Unspecified atrial fibrillation: Secondary | ICD-10-CM | POA: Diagnosis not present

## 2017-05-11 LAB — POCT INR: INR: 1.9

## 2017-05-11 NOTE — Patient Instructions (Signed)
Start new dosage of 3 pills everyday. Recheck INR in 2 weeks.

## 2017-05-16 ENCOUNTER — Ambulatory Visit: Payer: Medicare Other | Admitting: Family Medicine

## 2017-05-18 ENCOUNTER — Ambulatory Visit: Payer: Medicare Other

## 2017-05-24 ENCOUNTER — Encounter: Payer: Medicare Other | Admitting: Internal Medicine

## 2017-05-25 ENCOUNTER — Ambulatory Visit: Payer: Medicare Other | Admitting: Family Medicine

## 2017-05-25 ENCOUNTER — Ambulatory Visit (INDEPENDENT_AMBULATORY_CARE_PROVIDER_SITE_OTHER): Payer: Medicare Other

## 2017-05-25 DIAGNOSIS — I4891 Unspecified atrial fibrillation: Secondary | ICD-10-CM | POA: Diagnosis not present

## 2017-05-25 DIAGNOSIS — Z5181 Encounter for therapeutic drug level monitoring: Secondary | ICD-10-CM

## 2017-05-25 LAB — POCT INR: INR: 2.3

## 2017-05-25 NOTE — Patient Instructions (Signed)
Continue current dosage of 3 pills everyday. Recheck INR in 3 weeks.

## 2017-06-14 ENCOUNTER — Encounter: Payer: Self-pay | Admitting: Internal Medicine

## 2017-06-14 ENCOUNTER — Ambulatory Visit (INDEPENDENT_AMBULATORY_CARE_PROVIDER_SITE_OTHER): Payer: Medicare Other | Admitting: Internal Medicine

## 2017-06-14 ENCOUNTER — Ambulatory Visit (INDEPENDENT_AMBULATORY_CARE_PROVIDER_SITE_OTHER): Payer: Medicare Other | Admitting: Pharmacist

## 2017-06-14 VITALS — BP 143/91 | HR 71 | Ht 59.0 in | Wt 149.5 lb

## 2017-06-14 DIAGNOSIS — Z79899 Other long term (current) drug therapy: Secondary | ICD-10-CM

## 2017-06-14 DIAGNOSIS — Z5181 Encounter for therapeutic drug level monitoring: Secondary | ICD-10-CM | POA: Diagnosis not present

## 2017-06-14 DIAGNOSIS — I480 Paroxysmal atrial fibrillation: Secondary | ICD-10-CM

## 2017-06-14 DIAGNOSIS — I4891 Unspecified atrial fibrillation: Secondary | ICD-10-CM

## 2017-06-14 DIAGNOSIS — Z950 Presence of cardiac pacemaker: Secondary | ICD-10-CM

## 2017-06-14 DIAGNOSIS — I495 Sick sinus syndrome: Secondary | ICD-10-CM | POA: Diagnosis not present

## 2017-06-14 LAB — POCT INR: INR: 3

## 2017-06-14 NOTE — Progress Notes (Signed)
Patient Care Team: Roselee Nova, MD as PCP - General (Family Medicine) Deboraha Sprang, MD as Consulting Physician (Cardiology)   HPI  Felicia Poole is a 72 y.o. female Seen for pacemaker Medtronic  followup for atrial fibrillation and significant post termination pauses. An event recorder  demonstrated pauses ;  she and her daughter chose to go to [facility] for pacemaker insertion.   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 She is a history of a nonischemic cardiomyopathy with ejection fraction 45% and a nuclear test March 2014 by Dr. Clayborn Bigness that apparently showed no ischemia and normal left ventricular function.  DATE TEST    8/14 Echo    EF 45-50 %         Significant difficulty with time in the therapeutic range; no bleeding.  No chest pain or edema.  Stable shortness of breath.  Husband passed away last summer.  They have been married 50+ years.      Date Cr K  11/16 0.88 4.5          Past Medical History:  Diagnosis Date  . Atrial myxoma    Status post resection in 2012 at [facility]  . Benign neoplasm of heart   . Contact dermatitis and other eczema, due to unspecified cause   . Contusion of unspecified site   . Coronary atherosclerosis of unspecified type of vessel, native or graft    Status post CABG in March of 2012 at the time of atrial myxoma resection with LIMA to LAD done at [facility]  . Cramp of limb   . Essential hypertension, benign   . Heartburn   . Lumbago   . Nonischemic cardiomyopathy ([place])    EF: 45% in 2012  . Nonischemic cardiomyopathy ([place])   . Osteoporosis, unspecified   . Other and unspecified hyperlipidemia   . Other specified cardiac dysrhythmias(427.89)   . Postsurgical percutaneous transluminal coronary angioplasty status   . Type II or unspecified type diabetes mellitus without mention of complication, not stated as uncontrolled   . Unspecified hypothyroidism   .  Unspecified menopausal and postmenopausal disorder     Past Surgical History:  Procedure Laterality Date  . ATRIAL MYXOMA EXCISION Left   . CARDIAC CATHETERIZATION  2012   [facility]; right and left heart 75% ostial LAD lesion  . CORONARY ARTERY BYPASS GRAFT  2011   [facility]  . INSERT / REPLACE / REMOVE PACEMAKER  02/2013   Dual chamber MDT advisa pacemaker. [id number]  . VAGINAL HYSTERECTOMY      Current Outpatient Medications  Medication Sig Dispense Refill  . alendronate (FOSAMAX) 70 MG tablet Take 70 mg by mouth.    Marland Kitchen amLODipine (NORVASC) 10 MG tablet Take 1 tablet (10 mg total) by mouth daily. 90 tablet 0  . aspirin 81 MG chewable tablet Chew 81 mg by mouth.    Marland Kitchen atorvastatin (LIPITOR) 80 MG tablet Take 80 mg by mouth.    . Calcium Carb-Cholecalciferol (CALCIUM-VITAMIN D) 500-200 MG-UNIT tablet Take by mouth.    . chlorthalidone (HYGROTON) 25 MG tablet Take 25 mg by mouth daily.    . fluticasone (FLONASE) 50 MCG/ACT nasal spray SPRAY 1 SPRAY IN EACH NOSTRIL 2 TIMES A DAY 16 g 0  . furosemide (LASIX) 40 MG tablet Take 20 mg by mouth.    . levothyroxine (SYNTHROID, LEVOTHROID) 75 MCG tablet Take 1 tablet (75 mcg total) by  mouth daily. 90 tablet 0  . lisinopril (PRINIVIL,ZESTRIL) 20 MG tablet TAKE 1 TABLET (20 MG TOTAL) BY MOUTH DAILY. 90 tablet 0  . meloxicam (MOBIC) 15 MG tablet Take 1 tablet (15 mg total) by mouth daily. 30 tablet 3  . mirabegron ER (MYRBETRIQ) 25 MG TB24 tablet Take 1 tablet (25 mg total) by mouth daily. 30 tablet 0  . nystatin (MYCOSTATIN/NYSTOP) powder Apply topically 2 (two) times daily. 45 g 0  . olopatadine (PATANOL) 0.1 % ophthalmic solution Place 1 drop into both eyes 2 (two) times daily. 5 mL 3  . ranitidine (ZANTAC) 300 MG tablet Take 300 mg by mouth.    . sotalol (BETAPACE) 80 MG tablet Take 80 mg by mouth.    . terbinafine (LAMISIL AT) 1 % cream Apply 1 application topically 2 (two) times daily. 30 g 0  . traMADol (ULTRAM) 50 MG tablet Take 1 tablet (50 mg total)  by mouth every 12 (twelve) hours as needed for moderate pain. 10 tablet 0  . Vitamin D, Ergocalciferol, (DRISDOL) 50000 units CAPS capsule Take by mouth.    . warfarin (COUMADIN) 2.5 MG tablet Take as directed by [facility] 65 tablet 1   No current facility-administered medications for this visit.     No Known Allergies  Review of Systems negative except from HPI and PMH  Physical Exam BP (!) 143/91 (BP Location: Left Arm, Patient Position: Sitting, Cuff Size: Normal)   Pulse 71   Ht 4\' 11"  (1.499 m)   Wt 149 lb 8 oz (67.8 kg)   BMI 30.20 kg/m  Well developed and nourished in no acute distress HENT normal Neck supple with JVP-flat Clear Regular rate and rhythm, no murmurs or gallops Abd-soft with active BS No Clubbing cyanosis edema Skin-warm and dry A & Oriented  Grossly normal sensory and motor function      ECG sinus with P synchronous pacing     Assessment and  Plan  Paroxysmal atrial fibrillation with posttermination pausing  Sinus node dysfunction  100% A pacing  High risk medication surveillance  HFpEF  Pacemaker The patient's device was interrogated and the information was fully reviewed.  The device was reprogrammed to  Decrease rate response to minimize her tachypalpitations  Atrial myxoma history of resection  Overall she is stable.  Check labs on sotalol.  No interval atrial fibrillation.

## 2017-06-14 NOTE — Patient Instructions (Signed)
Description   Continue current dosage of 3 pills everyday. Recheck INR in 4 weeks.

## 2017-06-14 NOTE — Patient Instructions (Addendum)
Medication Instructions: - Your physician recommends that you continue on your current medications as directed. Please refer to the Current Medication list given to you today.  Labwork: - Your physician recommends that you have lab work today: BMP/ CBC/ Magnesium  Procedures/Testing: - none ordered  Follow-Up: - Your physician wants you to follow-up in: 6 months with [facility] & 1 year with Dr. Caryl Comes.  You will receive a reminder letter in the mail two months in advance. If you don't receive a letter, please call our office to schedule the follow-up appointment.   Any Additional Special Instructions Will Be Listed Below (If Applicable).     If you need a refill on your cardiac medications before your next appointment, please call your pharmacy.

## 2017-06-15 LAB — CBC WITH DIFFERENTIAL/PLATELET
BASOS ABS: 0 10*3/uL (ref 0.0–0.2)
BASOS: 1 %
EOS (ABSOLUTE): 0.1 10*3/uL (ref 0.0–0.4)
Eos: 3 %
Hematocrit: 36.5 % (ref 34.0–46.6)
Hemoglobin: 12.4 g/dL (ref 11.1–15.9)
IMMATURE GRANS (ABS): 0 10*3/uL (ref 0.0–0.1)
Immature Granulocytes: 0 %
LYMPHS ABS: 1.1 10*3/uL (ref 0.7–3.1)
LYMPHS: 27 %
MCH: 31.4 pg (ref 26.6–33.0)
MCHC: 34 g/dL (ref 31.5–35.7)
MCV: 92 fL (ref 79–97)
MONOCYTES: 5 %
Monocytes Absolute: 0.2 10*3/uL (ref 0.1–0.9)
NEUTROS ABS: 2.5 10*3/uL (ref 1.4–7.0)
Neutrophils: 64 %
Platelets: 238 10*3/uL (ref 150–379)
RBC: 3.95 x10E6/uL (ref 3.77–5.28)
RDW: 13.2 % (ref 12.3–15.4)
WBC: 3.9 10*3/uL (ref 3.4–10.8)

## 2017-06-15 LAB — BASIC METABOLIC PANEL
BUN / CREAT RATIO: 11 — AB (ref 12–28)
BUN: 9 mg/dL (ref 8–27)
CHLORIDE: 103 mmol/L (ref 96–106)
CO2: 25 mmol/L (ref 20–29)
Calcium: 9.2 mg/dL (ref 8.7–10.3)
Creatinine, Ser: 0.84 mg/dL (ref 0.57–1.00)
GFR calc non Af Amer: 70 mL/min/{1.73_m2} (ref >59)
GFR, EST AFRICAN AMERICAN: 80 mL/min/{1.73_m2} (ref >59)
Glucose: 95 mg/dL (ref 65–99)
Potassium: 4.6 mmol/L (ref 3.5–5.2)
SODIUM: 141 mmol/L (ref 134–144)

## 2017-06-15 LAB — MAGNESIUM: Magnesium: 2 mg/dL (ref 1.6–2.3)

## 2017-07-13 ENCOUNTER — Ambulatory Visit (INDEPENDENT_AMBULATORY_CARE_PROVIDER_SITE_OTHER): Payer: Medicare Other

## 2017-07-13 DIAGNOSIS — I4891 Unspecified atrial fibrillation: Secondary | ICD-10-CM

## 2017-07-13 DIAGNOSIS — Z5181 Encounter for therapeutic drug level monitoring: Secondary | ICD-10-CM | POA: Diagnosis not present

## 2017-07-13 LAB — POCT INR: INR: 2.1

## 2017-07-13 NOTE — Patient Instructions (Signed)
Continue current dosage of 3 pills everyday. Recheck INR in 5 weeks.

## 2017-07-26 ENCOUNTER — Encounter: Payer: Medicare Other | Admitting: Internal Medicine

## 2017-07-30 LAB — CUP PACEART INCLINIC DEVICE CHECK
Implantable Lead Implant Date: 20140919
Implantable Lead Location: 753860
Implantable Pulse Generator Implant Date: 20140919
MDC IDC LEAD IMPLANT DT: 20140919
MDC IDC LEAD LOCATION: 753859
MDC IDC SESS DTM: 20190223164706

## 2017-08-01 ENCOUNTER — Ambulatory Visit: Payer: Self-pay | Admitting: Podiatry

## 2017-08-15 ENCOUNTER — Ambulatory Visit: Payer: Self-pay | Admitting: Podiatry

## 2017-08-17 ENCOUNTER — Ambulatory Visit (INDEPENDENT_AMBULATORY_CARE_PROVIDER_SITE_OTHER): Payer: Medicare Other

## 2017-08-17 DIAGNOSIS — I4891 Unspecified atrial fibrillation: Secondary | ICD-10-CM

## 2017-08-17 DIAGNOSIS — Z5181 Encounter for therapeutic drug level monitoring: Secondary | ICD-10-CM | POA: Diagnosis not present

## 2017-08-17 LAB — POCT INR: INR: 1.9

## 2017-08-17 NOTE — Patient Instructions (Signed)
Please take 4 pills today, then continue current dosage of 3 pills everyday. Please pick day each week to have your greens and eat them every week on that day. Recheck INR in 4 weeks.

## 2017-08-22 ENCOUNTER — Ambulatory Visit: Payer: Self-pay | Admitting: Podiatry

## 2017-09-14 ENCOUNTER — Ambulatory Visit (INDEPENDENT_AMBULATORY_CARE_PROVIDER_SITE_OTHER): Payer: Medicare Other

## 2017-09-14 DIAGNOSIS — I4891 Unspecified atrial fibrillation: Secondary | ICD-10-CM | POA: Diagnosis not present

## 2017-09-14 DIAGNOSIS — Z5181 Encounter for therapeutic drug level monitoring: Secondary | ICD-10-CM

## 2017-09-14 LAB — POCT INR: INR: 1.9

## 2017-09-14 NOTE — Patient Instructions (Signed)
Please start new dosage of 3 pills everyday except 4 pills on Mondays and Fridays. Please pick day each week to have your greens and eat them every week on that day. Recheck INR in 3 weeks.

## 2017-10-04 ENCOUNTER — Telehealth: Payer: Self-pay

## 2017-10-04 NOTE — Telephone Encounter (Signed)
Copied from [place] [id number]. Topic: General - Other >> Oct 04, 2017  9:57 AM Carolyn Stare wrote:  Pt daughter call to say pt need a RX for pads to lay on bed and pads for underwear. Daughter said the underwear breaks her out . Pt is transfering from [facility] to [place]    Would like a call back  [phone]

## 2017-10-04 NOTE — Telephone Encounter (Signed)
I will send it during her visit in July

## 2017-10-05 ENCOUNTER — Ambulatory Visit (INDEPENDENT_AMBULATORY_CARE_PROVIDER_SITE_OTHER): Payer: Medicare Other

## 2017-10-05 DIAGNOSIS — Z5181 Encounter for therapeutic drug level monitoring: Secondary | ICD-10-CM

## 2017-10-05 DIAGNOSIS — I4891 Unspecified atrial fibrillation: Secondary | ICD-10-CM | POA: Diagnosis not present

## 2017-10-05 LAB — POCT INR: INR: 2.1

## 2017-10-05 MED ORDER — WARFARIN SODIUM 2.5 MG PO TABS: ORAL_TABLET | ORAL | 1 refills | Status: DC

## 2017-10-05 NOTE — Telephone Encounter (Signed)
Patient daughter Felicia Poole was notified we are unable to fill prescription since it has been November 2018 since she was seen. Dr. Ancil Boozer has never seen this patient and does not want to prescription until we know the cause of her rash and incontinence since it could be from a possible UTI or her DM being out of control. Felicia Poole made her mother an appointment for Tuesday Oct 11, 2017.

## 2017-10-05 NOTE — Patient Instructions (Signed)
Please start new dosage of 3 pills everyday except 4 pills on Mondays and Fridays. Please pick day each week to have your greens and eat them every week on that day. Recheck INR in 3 weeks.

## 2017-10-11 ENCOUNTER — Ambulatory Visit (INDEPENDENT_AMBULATORY_CARE_PROVIDER_SITE_OTHER): Payer: Medicare Other | Admitting: Nurse Practitioner

## 2017-10-11 ENCOUNTER — Encounter: Payer: Self-pay | Admitting: Nurse Practitioner

## 2017-10-11 VITALS — BP 134/86 | HR 75 | Temp 98.4°F | Resp 16 | Ht 59.0 in | Wt 151.8 lb

## 2017-10-11 DIAGNOSIS — J302 Other seasonal allergic rhinitis: Secondary | ICD-10-CM

## 2017-10-11 DIAGNOSIS — I159 Secondary hypertension, unspecified: Secondary | ICD-10-CM | POA: Diagnosis not present

## 2017-10-11 DIAGNOSIS — K219 Gastro-esophageal reflux disease without esophagitis: Secondary | ICD-10-CM | POA: Diagnosis not present

## 2017-10-11 DIAGNOSIS — N393 Stress incontinence (female) (male): Secondary | ICD-10-CM | POA: Diagnosis not present

## 2017-10-11 DIAGNOSIS — Z5181 Encounter for therapeutic drug level monitoring: Secondary | ICD-10-CM

## 2017-10-11 DIAGNOSIS — R809 Proteinuria, unspecified: Secondary | ICD-10-CM

## 2017-10-11 DIAGNOSIS — E1129 Type 2 diabetes mellitus with other diabetic kidney complication: Secondary | ICD-10-CM

## 2017-10-11 DIAGNOSIS — E039 Hypothyroidism, unspecified: Secondary | ICD-10-CM

## 2017-10-11 DIAGNOSIS — L304 Erythema intertrigo: Secondary | ICD-10-CM

## 2017-10-11 DIAGNOSIS — E7800 Pure hypercholesterolemia, unspecified: Secondary | ICD-10-CM

## 2017-10-11 MED ORDER — ADHERES INCONTINENCE PAD MISC: 1.0000 "application " | Freq: Four times a day (QID) | 6 refills | Status: DC

## 2017-10-11 MED ORDER — ATORVASTATIN CALCIUM 80 MG PO TABS: 80.0000 mg | ORAL_TABLET | Freq: Every day | ORAL | 0 refills | Status: DC

## 2017-10-11 MED ORDER — AMLODIPINE BESYLATE 10 MG PO TABS: 10.0000 mg | ORAL_TABLET | Freq: Every day | ORAL | 0 refills | Status: DC

## 2017-10-11 MED ORDER — RANITIDINE HCL 300 MG PO TABS: 300.0000 mg | ORAL_TABLET | Freq: Every day | ORAL | 0 refills | Status: DC

## 2017-10-11 MED ORDER — "ABDOMINAL PAD 8""X10"" PADS": 1.0000 "application " | MEDICATED_PAD | Freq: Four times a day (QID) | 5 refills | Status: DC

## 2017-10-11 MED ORDER — CALCIUM CARB-CHOLECALCIFEROL 500-200 MG-UNIT PO TABS: 1.0000 | ORAL_TABLET | Freq: Every day | ORAL | 0 refills | Status: DC

## 2017-10-11 MED ORDER — FLUTICASONE PROPIONATE 50 MCG/ACT NA SUSP
NASAL | 0 refills | Status: DC
Start: 2017-10-11 — End: 2017-11-17

## 2017-10-11 NOTE — Progress Notes (Addendum)
Name: Felicia Poole   MRN: [id number]    DOB: June 03, 1946   Date:10/11/2017       Progress Note  Subjective  Chief Complaint  Chief Complaint  Patient presents with  . Rash    Onset-months- needs a prescripiton for paps instead of briefs. Due to a red, itchy rash that occurs under her belly region. Patient has tried vaseline and powder which did not.  . Medication Refill    Needs refill on medications    HPI  Pt accompanied by daughter presents for medication refill and requesting rx for pads and incontinence pads due to inability to pay out of pocket. Pt was wearing briefs but states was irritating skin. Patient has had incontinence ongoing for years- states inability to hold urine for long time periods and more frequent episodes at night. Denies dysuria or hematuria. Notes redness and rash under abdominal fat- improving after keeping clean and free from urine.   Pt denies complaints with medication, takes them daily Follows up with Dr. Caryl Comes- cards for warfarin management for afib and chf.   Denies cp, headaches, blurry vision, myalgias. Sts had history of diabetes but hasn't been checked in years. Denies polydipsia or polyphagia. Pt ambulatory without assistance, lives with daughter.  Lab Results  Component Value Date   HGBA1C 5.7 04/29/2015     Patient Active Problem List   Diagnosis Date Noted  . Angina pectoris ([place]) 04/11/2017  . Diabetes mellitus ([place]) 05/08/2015  . 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 Easy bruisability 04/29/2015  . Chronic anticoagulation 04/29/2015  . Adiposity 12/10/2014  . Obesity 12/10/2014  . Accumulation of fluid in tissues 10/15/2013  . Hypercholesterolemia 10/15/2013  . Encounter for therapeutic drug monitoring 08/08/2013  . Pacemaker -Medtronic 05/24/2013  . Tachy-brady syndrome ([place]) 03/02/2013  . Nonischemic cardiomyopathy ([place])   . Atrial fibrillation ([place]) 02/28/2013  . Other malaise 02/26/2013  .  Arteriosclerosis of coronary artery 02/22/2013  . BP (high blood pressure) 02/22/2013  . Adult hypothyroidism 02/22/2013  . Congestive heart failure with left ventricular systolic dysfunction ([place]) 02/22/2013  . Type 2 diabetes mellitus ([place]) 02/22/2013  . 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 Essential hypertension, benign   . Atrial myxoma   . Coronary atherosclerosis     Past Medical History:  Diagnosis Date  . Atrial myxoma    Status post resection in 2012 at [facility]  . Benign neoplasm of heart   . Contact dermatitis and other eczema, due to unspecified cause   . Contusion of unspecified site   . Coronary atherosclerosis of unspecified type of vessel, native or graft    Status post CABG in March of 2012 at the time of atrial myxoma resection with LIMA to LAD done at [facility]  . Cramp of limb   . Essential hypertension, benign   . Heartburn   . Lumbago   . Nonischemic cardiomyopathy ([place])    EF: 45% in 2012  . Nonischemic cardiomyopathy ([place])   . Osteoporosis, unspecified   . Other and unspecified hyperlipidemia   . Other specified cardiac dysrhythmias(427.89)   . Postsurgical percutaneous transluminal coronary angioplasty status   . Type II or unspecified type diabetes mellitus without mention of complication, not stated as uncontrolled   . Unspecified hypothyroidism   . Unspecified menopausal and postmenopausal disorder     Past Surgical History:  Procedure Laterality Date  . ATRIAL MYXOMA EXCISION Left   . CARDIAC CATHETERIZATION  2012   [facility]; right and left heart 75% ostial  LAD lesion  . CORONARY ARTERY BYPASS GRAFT  2011   [facility]  . INSERT / REPLACE / REMOVE PACEMAKER  02/2013   Dual chamber MDT advisa pacemaker. [id number]  . VAGINAL HYSTERECTOMY      Social History   Tobacco Use  . Smoking status: Never Smoker  . Smokeless tobacco: Never Used  Substance Use Topics  . Alcohol use: No     Current Outpatient Medications:  .  amLODipine (NORVASC) 10 MG tablet,  Take 1 tablet (10 mg total) by mouth daily., Disp: 90 tablet, Rfl: 0 .  atorvastatin (LIPITOR) 80 MG tablet, Take 80 mg by mouth., Disp: , Rfl:  .  chlorthalidone (HYGROTON) 25 MG tablet, Take 25 mg by mouth daily., Disp: , Rfl:  .  fluticasone (FLONASE) 50 MCG/ACT nasal spray, SPRAY 1 SPRAY IN EACH NOSTRIL 2 TIMES A DAY, Disp: 16 g, Rfl: 0 .  furosemide (LASIX) 40 MG tablet, Take 40 mg by mouth as needed. , Disp: , Rfl:  .  levothyroxine (SYNTHROID, LEVOTHROID) 75 MCG tablet, Take 1 tablet (75 mcg total) by mouth daily., Disp: 90 tablet, Rfl: 0 .  lisinopril (PRINIVIL,ZESTRIL) 20 MG tablet, TAKE 1 TABLET (20 MG TOTAL) BY MOUTH DAILY., Disp: 90 tablet, Rfl: 0 .  nystatin (MYCOSTATIN/NYSTOP) powder, Apply topically 2 (two) times daily., Disp: 45 g, Rfl: 0 .  olopatadine (PATANOL) 0.1 % ophthalmic solution, Place 1 drop into both eyes 2 (two) times daily., Disp: 5 mL, Rfl: 3 .  ranitidine (ZANTAC) 300 MG tablet, Take 300 mg by mouth., Disp: , Rfl:  .  sotalol (BETAPACE) 80 MG tablet, Take 80 mg by mouth 2 (two) times daily., Disp: , Rfl:  .  terbinafine (LAMISIL AT) 1 % cream, Apply 1 application topically 2 (two) times daily., Disp: 30 g, Rfl: 0 .  traMADol (ULTRAM) 50 MG tablet, Take 1 tablet (50 mg total) by mouth every 12 (twelve) hours as needed for moderate pain., Disp: 10 tablet, Rfl: 0 .  Vitamin D, Ergocalciferol, (DRISDOL) 50000 units CAPS capsule, Take by mouth., Disp: , Rfl:  .  warfarin (COUMADIN) 2.5 MG tablet, Take as directed by [facility], Disp: 65 tablet, Rfl: 1 .  alendronate (FOSAMAX) 70 MG tablet, Take 70 mg by mouth once a week. , Disp: , Rfl:  .  Calcium Carb-Cholecalciferol (CALCIUM 500+D) 500-200 MG-UNIT TABS, Take by mouth., Disp: , Rfl:  .  Calcium Carb-Cholecalciferol (CALCIUM-VITAMIN D) 500-200 MG-UNIT tablet, Take by mouth., Disp: , Rfl:  .  meloxicam (MOBIC) 15 MG tablet, Take 1 tablet (15 mg total) by mouth daily., Disp: 30 tablet, Rfl: 3 .  mirabegron ER  (MYRBETRIQ) 25 MG TB24 tablet, Take 1 tablet (25 mg total) by mouth daily. (Patient not taking: Reported on 10/11/2017), Disp: 30 tablet, Rfl: 0  No Known Allergies  ROS No other specific complaints in a complete review of systems (except as listed in HPI above).  Objective  Vitals:   10/11/17 1141  BP: 134/86  Pulse: 75  Resp: 16  Temp: 98.4 F (36.9 C)  TempSrc: Oral  SpO2: 97%  Weight: 151 lb 12.8 oz (68.9 kg)  Height: 4\' 11"  (1.499 m)     Body mass index is 30.66 kg/m.  Nursing Note and Vital Signs reviewed.  Physical Exam  Constitutional: Patient appears well-developed and well-nourished.  No distress.  Cardiovascular: Normal rate, regular rhythm, S1/S2 present. Pulses intact. No carotid bruits auscultated- did not hear or feel irregular rhythm.  Pulmonary/Chest: Effort normal and breath  sounds clear. No respiratory distress or retractions. Pacemaker left chest.  Abdominal: Soft and non-tender, bowel sounds present  MSK: kyphosis, steady gait  Psychiatric: Patient has a normal mood and affect. behavior is normal. Judgment and thought content normal.  No results found for this or any previous visit (from the past 72 hour(s)).  Assessment & Plan  1. Adult hypothyroidism  - TSH  2. Stress incontinence of urine  - Incontinence Supply Disposable (ADHERES INCONTINENCE PAD) MISC; 1 application by Does not apply route 4 (four) times daily.  Dispense: 100 each; Refill: 6 - Gauze Pads & Dressings (ABDOMINAL PAD) 3"S28" PADS; 1 application by Does not apply route 4 (four) times daily.  Dispense: 360 each; Refill: 5  3. Hypercholesterolemia  - Lipid Profile - atorvastatin (LIPITOR) 80 MG tablet; Take 1 tablet (80 mg total) by mouth daily.  Dispense: 90 tablet; Refill: 0  4. Diabetes mellitus with microalbuminuria (HCC)  - COMPLETE METABOLIC PANEL WITH GFR - HgB A1c  5. Secondary hypertension Stable  - COMPLETE METABOLIC PANEL WITH GFR - amLODipine (NORVASC) 10 MG  tablet; Take 1 tablet (10 mg total) by mouth daily.  Dispense: 90 tablet; Refill: 0  6. Gastroesophageal reflux disease without esophagitis  - ranitidine (ZANTAC) 300 MG tablet; Take 1 tablet (300 mg total) by mouth at bedtime.  Dispense: 90 tablet; Refill: 0  7. Seasonal allergies  - fluticasone (FLONASE) 50 MCG/ACT nasal spray; SPRAY 1 SPRAY IN EACH NOSTRIL 2 TIMES A DAY  Dispense: 16 g; Refill: 0  8. Medication monitoring encounter  - COMPLETE METABOLIC PANEL WITH GFR  9. Intertrigo - cont with nystatin powder, keep clean and dry, can use abdominal pad to reduce moisture and friction   Patient presents with daughter late to visit. Informed will check labs, provide refills and follow-up more frequently initially to make sure care provided is optimal during transition of providers. Continue to follow-up with cardiology. Will refill BP medications, and thyroid medications pending results of kidney function and TSH.  -Red flags and when to present for emergency care or RTC including fever >101.17F, chest pain, shortness of breath, new/worsening/un-resolving symptoms, bleeding reviewed with patient at time of visit. Follow up and care instructions discussed and provided in AVS. -Reviewed Health Maintenance: will follow-up in one month for further work up and education of chronic conditions.   ------------------------------------------ I have reviewed this encounter including the documentation in this note and/or discussed this patient with the provider, Suezanne Cheshire DNP AGNP-C. I am certifying that I agree with the content of this note as supervising physician. Enid Derry, [facility] 10/31/2017, 8:56 PM

## 2017-10-11 NOTE — Patient Instructions (Signed)
DASH Eating Plan DASH stands for "Dietary Approaches to Stop Hypertension." The DASH eating plan is a healthy eating plan that has been shown to reduce high blood pressure (hypertension). It may also reduce your risk for type 2 diabetes, heart disease, and stroke. The DASH eating plan may also help with weight loss. What are tips for following this plan? General guidelines  Avoid eating more than 2,300 mg (milligrams) of salt (sodium) a day. If you have hypertension, you may need to reduce your sodium intake to 1,500 mg a day.  Limit alcohol intake to no more than 1 drink a day for nonpregnant women and 2 drinks a day for men. One drink equals 12 oz of beer, 5 oz of wine, or 1 oz of hard liquor.  Work with your health care provider to maintain a healthy body weight or to lose weight. Ask what an ideal weight is for you.  Get at least 30 minutes of exercise that causes your heart to beat faster (aerobic exercise) most days of the week. Activities may include walking, swimming, or biking.  Work with your health care provider or diet and nutrition specialist (dietitian) to adjust your eating plan to your individual calorie needs. Reading food labels  Check food labels for the amount of sodium per serving. Choose foods with less than 5 percent of the Daily Value of sodium. Generally, foods with less than 300 mg of sodium per serving fit into this eating plan.  To find whole grains, look for the word "whole" as the first word in the ingredient list. Shopping  Buy products labeled as "low-sodium" or "no salt added."  Buy fresh foods. Avoid canned foods and premade or frozen meals. Cooking  Avoid adding salt when cooking. Use salt-free seasonings or herbs instead of table salt or sea salt. Check with your health care provider or pharmacist before using salt substitutes.  Do not fry foods. Cook foods using healthy methods such as baking, boiling, grilling, and broiling instead.  Cook with  heart-healthy oils, such as olive, canola, soybean, or sunflower oil. Meal planning   Eat a balanced diet that includes: ? 5 or more servings of fruits and vegetables each day. At each meal, try to fill half of your plate with fruits and vegetables. ? Up to 6-8 servings of whole grains each day. ? Less than 6 oz of lean meat, poultry, or fish each day. A 3-oz serving of meat is about the same size as a deck of cards. One egg equals 1 oz. ? 2 servings of low-fat dairy each day. ? A serving of nuts, seeds, or beans 5 times each week. ? Heart-healthy fats. Healthy fats called Omega-3 fatty acids are found in foods such as flaxseeds and coldwater fish, like sardines, salmon, and mackerel.  Limit how much you eat of the following: ? Canned or prepackaged foods. ? Food that is high in trans fat, such as fried foods. ? Food that is high in saturated fat, such as fatty meat. ? Sweets, desserts, sugary drinks, and other foods with added sugar. ? Full-fat dairy products.  Do not salt foods before eating.  Try to eat at least 2 vegetarian meals each week.  Eat more home-cooked food and less restaurant, buffet, and fast food.  When eating at a restaurant, ask that your food be prepared with less salt or no salt, if possible. What foods are recommended? The items listed may not be a complete list. Talk with your dietitian about what   dietary choices are best for you. Grains Whole-grain or whole-wheat bread. Whole-grain or whole-wheat pasta. Brown rice. Oatmeal. Quinoa. Bulgur. Whole-grain and low-sodium cereals. Pita bread. Low-fat, low-sodium crackers. Whole-wheat flour tortillas. Vegetables Fresh or frozen vegetables (raw, steamed, roasted, or grilled). Low-sodium or reduced-sodium tomato and vegetable juice. Low-sodium or reduced-sodium tomato sauce and tomato paste. Low-sodium or reduced-sodium canned vegetables. Fruits All fresh, dried, or frozen fruit. Canned fruit in natural juice (without  added sugar). Meat and other protein foods Skinless chicken or turkey. Ground chicken or turkey. Pork with fat trimmed off. Fish and seafood. Egg whites. Dried beans, peas, or lentils. Unsalted nuts, nut butters, and seeds. Unsalted canned beans. Lean cuts of beef with fat trimmed off. Low-sodium, lean deli meat. Dairy Low-fat (1%) or fat-free (skim) milk. Fat-free, low-fat, or reduced-fat cheeses. Nonfat, low-sodium ricotta or cottage cheese. Low-fat or nonfat yogurt. Low-fat, low-sodium cheese. Fats and oils Soft margarine without trans fats. Vegetable oil. Low-fat, reduced-fat, or light mayonnaise and salad dressings (reduced-sodium). Canola, safflower, olive, soybean, and sunflower oils. Avocado. Seasoning and other foods Herbs. Spices. Seasoning mixes without salt. Unsalted popcorn and pretzels. Fat-free sweets. What foods are not recommended? The items listed may not be a complete list. Talk with your dietitian about what dietary choices are best for you. Grains Baked goods made with fat, such as croissants, muffins, or some breads. Dry pasta or rice meal packs. Vegetables Creamed or fried vegetables. Vegetables in a cheese sauce. Regular canned vegetables (not low-sodium or reduced-sodium). Regular canned tomato sauce and paste (not low-sodium or reduced-sodium). Regular tomato and vegetable juice (not low-sodium or reduced-sodium). Pickles. Olives. Fruits Canned fruit in a light or heavy syrup. Fried fruit. Fruit in cream or butter sauce. Meat and other protein foods Fatty cuts of meat. Ribs. Fried meat. Bacon. Sausage. Bologna and other processed lunch meats. Salami. Fatback. Hotdogs. Bratwurst. Salted nuts and seeds. Canned beans with added salt. Canned or smoked fish. Whole eggs or egg yolks. Chicken or turkey with skin. Dairy Whole or 2% milk, cream, and half-and-half. Whole or full-fat cream cheese. Whole-fat or sweetened yogurt. Full-fat cheese. Nondairy creamers. Whipped toppings.  Processed cheese and cheese spreads. Fats and oils Butter. Stick margarine. Lard. Shortening. Ghee. Bacon fat. Tropical oils, such as coconut, palm kernel, or palm oil. Seasoning and other foods Salted popcorn and pretzels. Onion salt, garlic salt, seasoned salt, table salt, and sea salt. Worcestershire sauce. Tartar sauce. Barbecue sauce. Teriyaki sauce. Soy sauce, including reduced-sodium. Steak sauce. Canned and packaged gravies. Fish sauce. Oyster sauce. Cocktail sauce. Horseradish that you find on the shelf. Ketchup. Mustard. Meat flavorings and tenderizers. Bouillon cubes. Hot sauce and Tabasco sauce. Premade or packaged marinades. Premade or packaged taco seasonings. Relishes. Regular salad dressings. Where to find more information:  National Heart, Lung, and Blood Institute: www.nhlbi.nih.gov  American Heart Association: www.heart.org Summary  The DASH eating plan is a healthy eating plan that has been shown to reduce high blood pressure (hypertension). It may also reduce your risk for type 2 diabetes, heart disease, and stroke.  With the DASH eating plan, you should limit salt (sodium) intake to 2,300 mg a day. If you have hypertension, you may need to reduce your sodium intake to 1,500 mg a day.  When on the DASH eating plan, aim to eat more fresh fruits and vegetables, whole grains, lean proteins, low-fat dairy, and heart-healthy fats.  Work with your health care provider or diet and nutrition specialist (dietitian) to adjust your eating plan to your individual   calorie needs. This information is not intended to replace advice given to you by your health care provider. Make sure you discuss any questions you have with your health care provider. Document Released: 05/13/2011 Document Revised: 05/17/2016 Document Reviewed: 05/17/2016 Elsevier Interactive Patient Education  2018 Elsevier Inc.  

## 2017-10-12 LAB — COMPLETE METABOLIC PANEL WITH GFR
AG Ratio: 1.6 (calc) (ref 1.0–2.5)
ALBUMIN MSPROF: 4.2 g/dL (ref 3.6–5.1)
ALKALINE PHOSPHATASE (APISO): 85 U/L (ref 33–130)
ALT: 14 U/L (ref 6–29)
AST: 20 U/L (ref 10–35)
BILIRUBIN TOTAL: 0.7 mg/dL (ref 0.2–1.2)
BUN / CREAT RATIO: 19 (calc) (ref 6–22)
BUN: 19 mg/dL (ref 7–25)
CO2: 30 mmol/L (ref 20–32)
CREATININE: 1.02 mg/dL — AB (ref 0.60–0.93)
Calcium: 9.7 mg/dL (ref 8.6–10.4)
Chloride: 103 mmol/L (ref 98–110)
GFR, EST AFRICAN AMERICAN: 64 mL/min/{1.73_m2} (ref >=60)
GFR, Est Non African American: 55 mL/min/{1.73_m2} — ABNORMAL LOW (ref >=60)
GLOBULIN: 2.7 g/dL (ref 1.9–3.7)
Glucose, Bld: 86 mg/dL (ref 65–99)
Potassium: 4.4 mmol/L (ref 3.5–5.3)
SODIUM: 140 mmol/L (ref 135–146)
Total Protein: 6.9 g/dL (ref 6.1–8.1)

## 2017-10-12 LAB — LIPID PANEL
CHOLESTEROL: 211 mg/dL — AB (ref <200)
HDL: 49 mg/dL — ABNORMAL LOW (ref >50)
LDL Cholesterol (Calc): 141 mg/dL (calc) — ABNORMAL HIGH
Non-HDL Cholesterol (Calc): 162 mg/dL (calc) — ABNORMAL HIGH (ref <130)
TRIGLYCERIDES: 100 mg/dL (ref <150)
Total CHOL/HDL Ratio: 4.3 (calc) (ref <5.0)

## 2017-10-12 LAB — HEMOGLOBIN A1C
EAG (MMOL/L): 6.6 (calc)
HEMOGLOBIN A1C: 5.8 %{Hb} — AB (ref <5.7)
MEAN PLASMA GLUCOSE: 120 (calc)

## 2017-10-12 LAB — TSH: TSH: 8.36 m[IU]/L — AB (ref 0.40–4.50)

## 2017-10-13 ENCOUNTER — Other Ambulatory Visit: Payer: Self-pay | Admitting: Nurse Practitioner

## 2017-10-13 DIAGNOSIS — E039 Hypothyroidism, unspecified: Secondary | ICD-10-CM

## 2017-10-13 IMAGING — US US EXTREM LOW VENOUS BILAT
1 series · 13 of 24 positions shown · non-contrast
Comparison: None.

CLINICAL DATA: 70-year-old female with bilateral swelling of the
for the past week. Initial encounter.

EXAM:
BILATERAL LOWER EXTREMITY VENOUS DOPPLER ULTRASOUND
TECHNIQUE: Gray-scale sonography with graded compression, as well as color
Doppler and duplex ultrasound were performed to evaluate the lower
extremity deep venous systems from the level of the common femoral
vein and including the common femoral, femoral, profunda femoral,
popliteal and calf veins including the posterior tibial, peroneal
and gastrocnemius veins when visible. Spectral Doppler was utilized
to evaluate flow at rest and with distal augmentation maneuvers in
the common femoral, femoral and popliteal veins.

[Series 1: us extrem low venous bilat · 0.07mm/px · 13 of 60 slices shown]
[im 1/60]
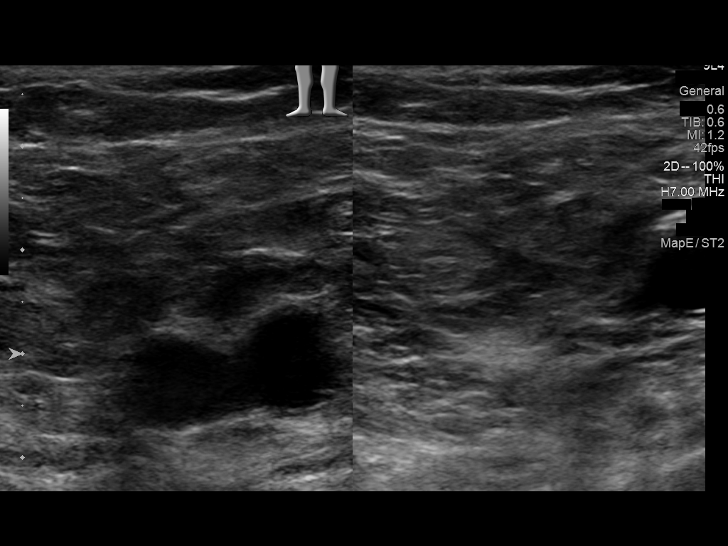
[im 6/60]
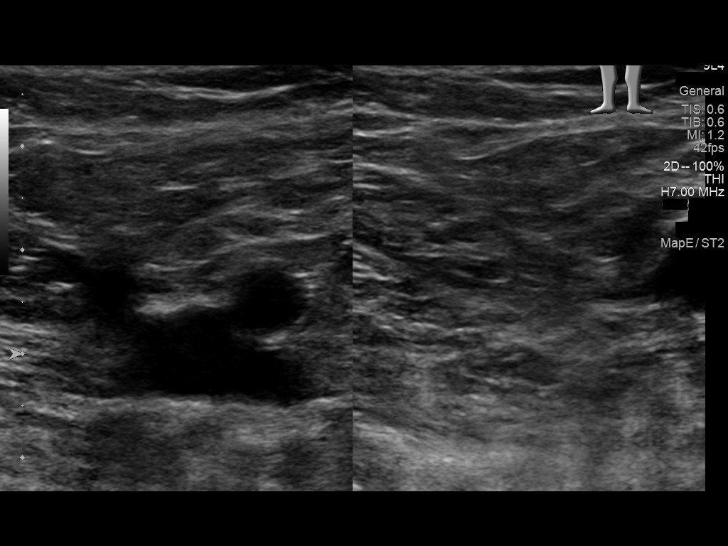
[im 11/60]
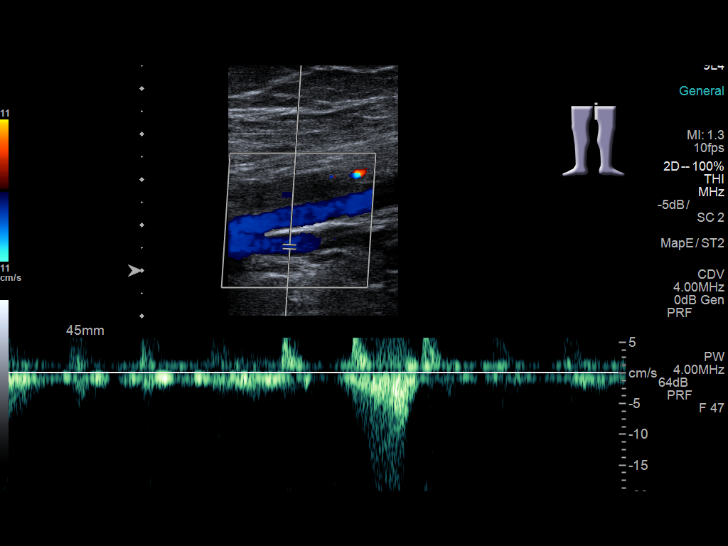
[im 16/60]
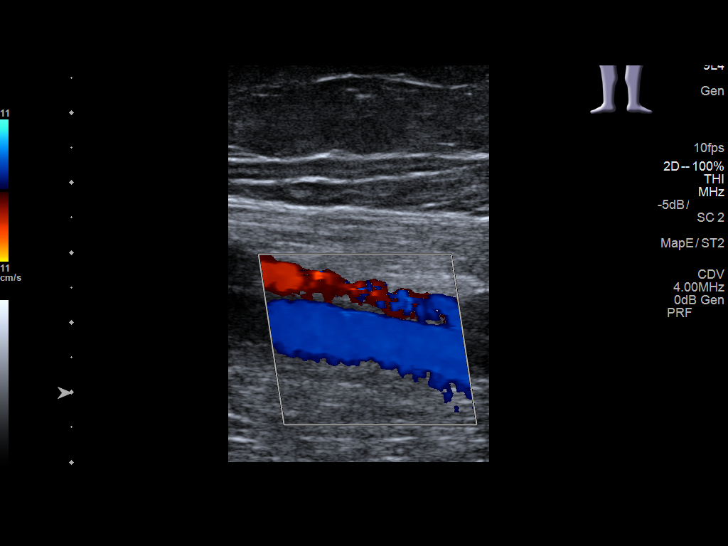
[im 21/60]
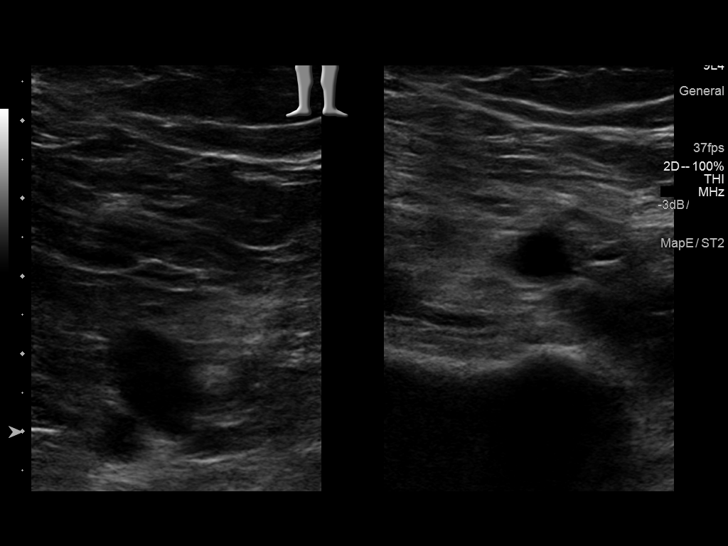
[im 26/60]
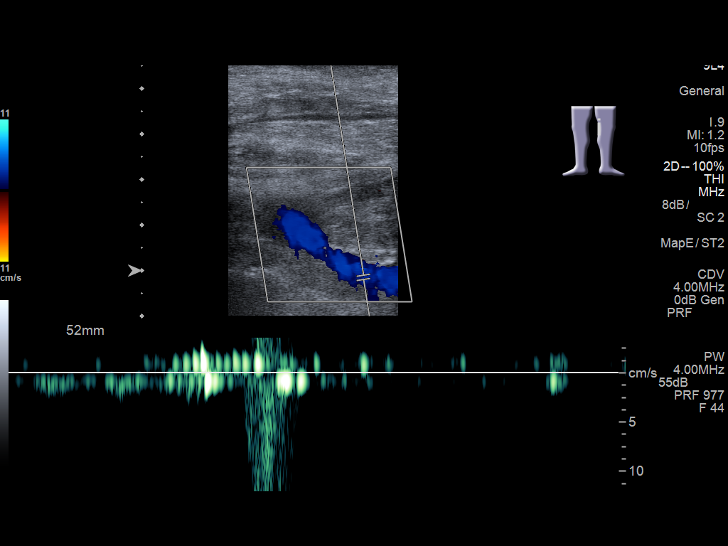
[im 31/60]
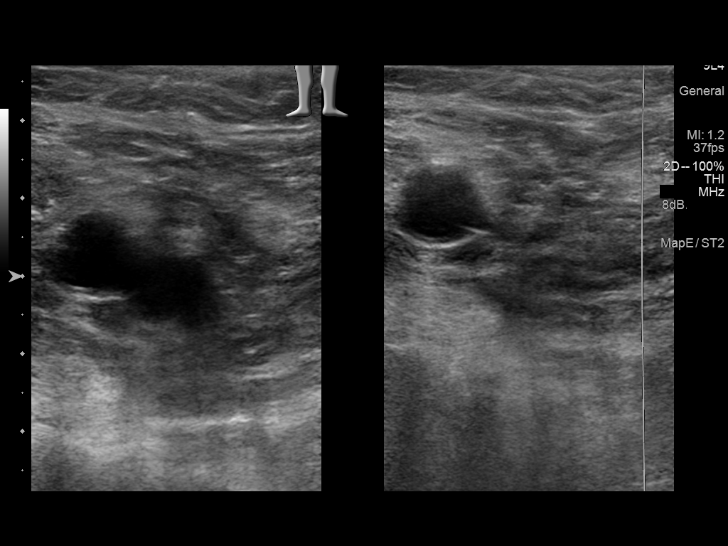
[im 34/60]
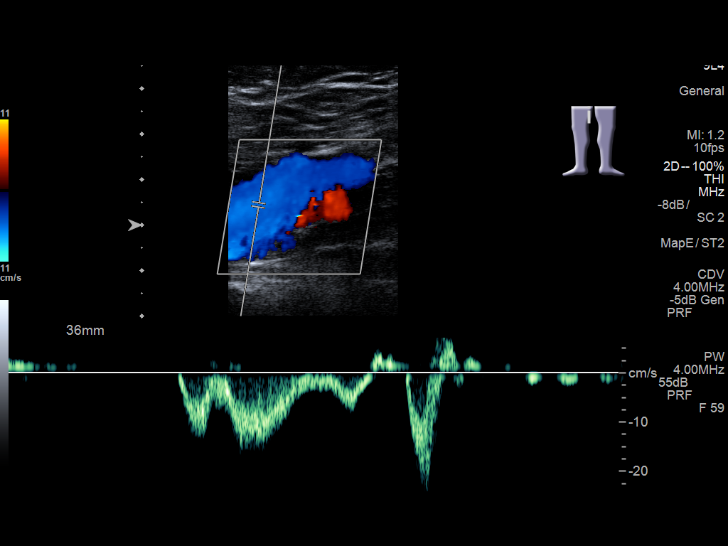
[im 39/60]
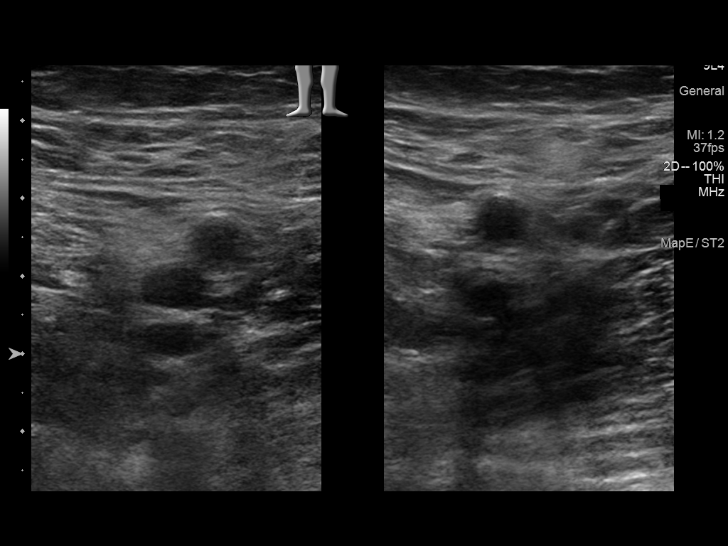
[im 44/60]
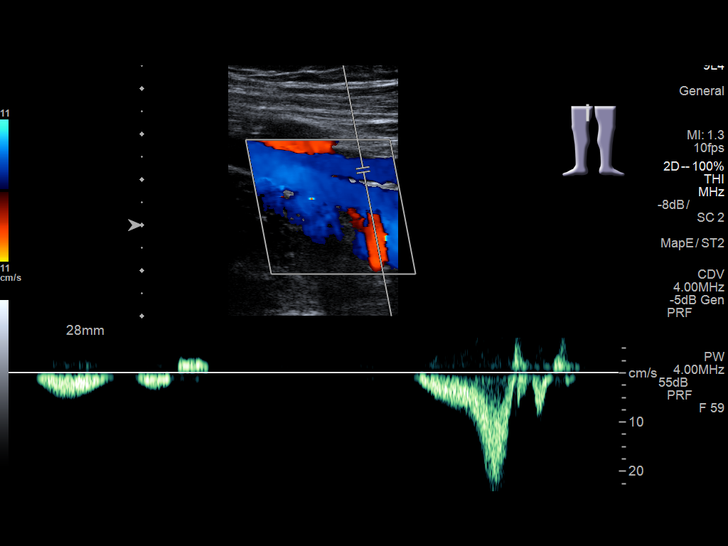
[im 49/60]
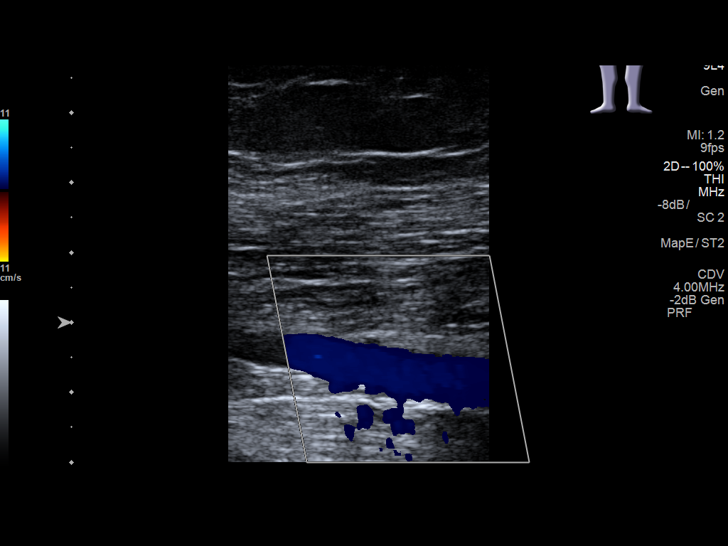
[im 54/60]
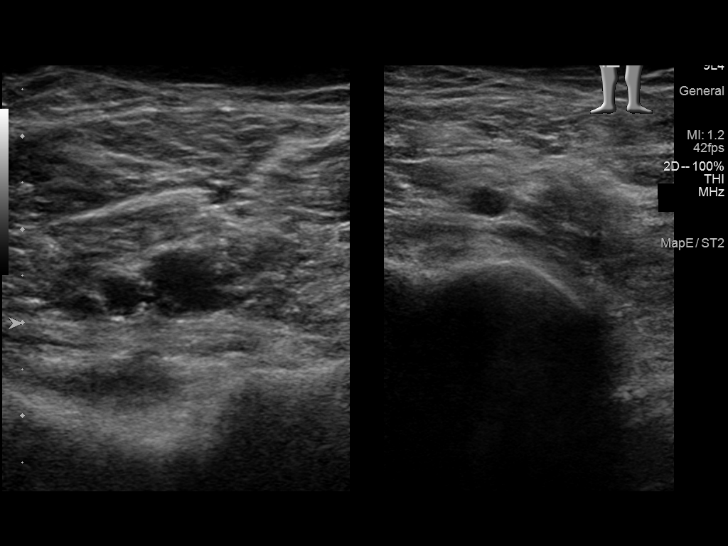
[im 60/60]
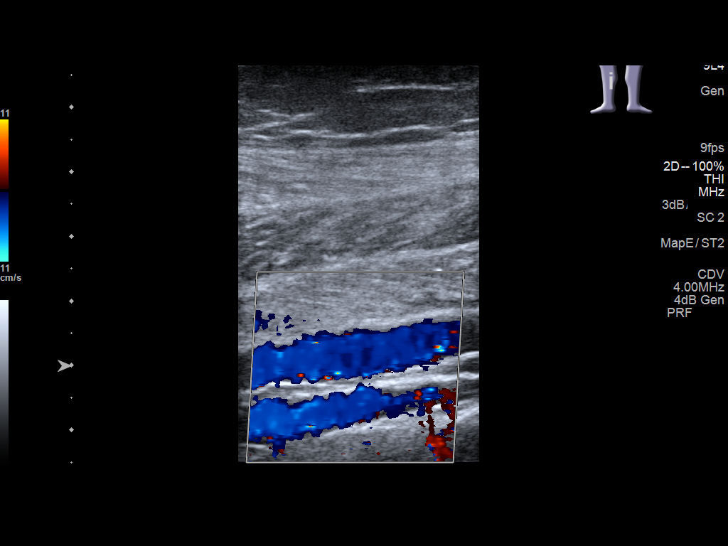

[13 of 24 positions shown; findings below may reference images not displayed]

FINDINGS: RIGHT LOWER EXTREMITY

Common Femoral Vein: No evidence of thrombus. Normal
compressibility, respiratory phasicity and response to augmentation.

Saphenofemoral Junction: No evidence of thrombus. Normal
compressibility and flow on color Doppler imaging.

Profunda Femoral Vein: No evidence of thrombus. Normal
compressibility and flow on color Doppler imaging.

Femoral Vein: No evidence of thrombus. Normal compressibility,
respiratory phasicity and response to augmentation.

Popliteal Vein: No evidence of thrombus. Normal compressibility,
respiratory phasicity and response to augmentation.

Calf Veins: No evidence of thrombus. Normal compressibility and flow
on color Doppler imaging.

LEFT LOWER EXTREMITY

Common Femoral Vein: No evidence of thrombus. Normal
compressibility, respiratory phasicity and response to augmentation.

Saphenofemoral Junction: No evidence of thrombus. Normal
compressibility and flow on color Doppler imaging.

Profunda Femoral Vein: No evidence of thrombus. Normal
compressibility and flow on color Doppler imaging.

Femoral Vein: No evidence of thrombus. Normal compressibility,
respiratory phasicity and response to augmentation.

Popliteal Vein: No evidence of thrombus. Normal compressibility,
respiratory phasicity and response to augmentation.

Calf Veins: No evidence of thrombus. Normal compressibility and flow
on color Doppler imaging.
IMPRESSION: No evidence of DVT within either lower extremity.

## 2017-10-13 MED ORDER — LEVOTHYROXINE SODIUM 88 MCG PO TABS: 88.0000 ug | ORAL_TABLET | Freq: Every day | ORAL | 0 refills | Status: DC

## 2017-10-14 ENCOUNTER — Ambulatory Visit: Payer: Medicare Other | Admitting: Nurse Practitioner

## 2017-11-07 ENCOUNTER — Ambulatory Visit (INDEPENDENT_AMBULATORY_CARE_PROVIDER_SITE_OTHER): Payer: Medicare Other

## 2017-11-07 DIAGNOSIS — I4891 Unspecified atrial fibrillation: Secondary | ICD-10-CM

## 2017-11-07 DIAGNOSIS — Z5181 Encounter for therapeutic drug level monitoring: Secondary | ICD-10-CM | POA: Diagnosis not present

## 2017-11-07 LAB — POCT INR: INR: 2.5 (ref 2.0–3.0)

## 2017-11-07 NOTE — Patient Instructions (Signed)
Please continue dosage of 3 pills everyday except 4 pills on Mondays and Fridays. Please pick day each week to have your greens and eat them every week on that day. Recheck INR in 4 weeks. 

## 2017-11-11 ENCOUNTER — Ambulatory Visit: Payer: Medicare Other | Admitting: Nurse Practitioner

## 2017-11-14 ENCOUNTER — Ambulatory Visit: Payer: Medicare Other | Admitting: Nurse Practitioner

## 2017-11-16 ENCOUNTER — Ambulatory Visit: Payer: Medicare Other | Admitting: Nurse Practitioner

## 2017-11-17 ENCOUNTER — Other Ambulatory Visit: Payer: Self-pay | Admitting: Nurse Practitioner

## 2017-11-17 DIAGNOSIS — J302 Other seasonal allergic rhinitis: Secondary | ICD-10-CM

## 2017-12-07 ENCOUNTER — Ambulatory Visit (INDEPENDENT_AMBULATORY_CARE_PROVIDER_SITE_OTHER): Payer: Medicare Other

## 2017-12-07 DIAGNOSIS — I4891 Unspecified atrial fibrillation: Secondary | ICD-10-CM

## 2017-12-07 DIAGNOSIS — Z5181 Encounter for therapeutic drug level monitoring: Secondary | ICD-10-CM

## 2017-12-07 LAB — POCT INR: INR: 1.7 — AB (ref 2.0–3.0)

## 2017-12-07 NOTE — Patient Instructions (Signed)
Please take 10 mg of coumadin tonight, then continue dosage of 3 pills everyday except 4 pills on Mondays and Fridays. Please pick day each week to have your greens and eat them every week on that day. Recheck INR in 2 weeks.

## 2017-12-15 ENCOUNTER — Ambulatory Visit: Payer: Medicare Other | Admitting: Family Medicine

## 2017-12-20 ENCOUNTER — Ambulatory Visit: Payer: Medicare Other | Admitting: Family Medicine

## 2017-12-21 ENCOUNTER — Ambulatory Visit (INDEPENDENT_AMBULATORY_CARE_PROVIDER_SITE_OTHER): Payer: Medicare Other

## 2017-12-21 DIAGNOSIS — I4891 Unspecified atrial fibrillation: Secondary | ICD-10-CM | POA: Diagnosis not present

## 2017-12-21 DIAGNOSIS — Z5181 Encounter for therapeutic drug level monitoring: Secondary | ICD-10-CM

## 2017-12-21 LAB — POCT INR: INR: 2 (ref 2.0–3.0)

## 2017-12-21 NOTE — Patient Instructions (Signed)
Please take 10 mg (4 pills) of coumadin tonight, then continue dosage of 3 pills everyday except 4 pills on Mondays and Fridays. Please pick day each week to have your greens and eat them every week on that day. Recheck INR in 2 weeks.

## 2018-01-04 ENCOUNTER — Ambulatory Visit (INDEPENDENT_AMBULATORY_CARE_PROVIDER_SITE_OTHER): Payer: Medicare Other

## 2018-01-04 DIAGNOSIS — I4891 Unspecified atrial fibrillation: Secondary | ICD-10-CM

## 2018-01-04 DIAGNOSIS — Z5181 Encounter for therapeutic drug level monitoring: Secondary | ICD-10-CM | POA: Diagnosis not present

## 2018-01-04 LAB — POCT INR: INR: 2.5 (ref 2.0–3.0)

## 2018-01-04 NOTE — Patient Instructions (Signed)
Please continue dosage of 3 pills everyday except 4 pills on Mondays and Fridays. Please pick day each week to have your greens and eat them every week on that day. Recheck INR in 3 weeks.

## 2018-01-21 ENCOUNTER — Other Ambulatory Visit: Payer: Self-pay | Admitting: Nurse Practitioner

## 2018-01-21 DIAGNOSIS — E7800 Pure hypercholesterolemia, unspecified: Secondary | ICD-10-CM

## 2018-01-21 DIAGNOSIS — I159 Secondary hypertension, unspecified: Secondary | ICD-10-CM

## 2018-01-21 DIAGNOSIS — K219 Gastro-esophageal reflux disease without esophagitis: Secondary | ICD-10-CM

## 2018-01-24 ENCOUNTER — Ambulatory Visit (INDEPENDENT_AMBULATORY_CARE_PROVIDER_SITE_OTHER): Payer: Medicare Other

## 2018-01-24 VITALS — BP 124/68 | HR 70 | Temp 97.4°F | Resp 14 | Ht 59.0 in | Wt 147.6 lb

## 2018-01-24 DIAGNOSIS — Z1239 Encounter for other screening for malignant neoplasm of breast: Secondary | ICD-10-CM

## 2018-01-24 DIAGNOSIS — E119 Type 2 diabetes mellitus without complications: Secondary | ICD-10-CM | POA: Diagnosis not present

## 2018-01-24 DIAGNOSIS — M810 Age-related osteoporosis without current pathological fracture: Secondary | ICD-10-CM | POA: Diagnosis not present

## 2018-01-24 DIAGNOSIS — Z1211 Encounter for screening for malignant neoplasm of colon: Secondary | ICD-10-CM | POA: Diagnosis not present

## 2018-01-24 DIAGNOSIS — Z1231 Encounter for screening mammogram for malignant neoplasm of breast: Secondary | ICD-10-CM

## 2018-01-24 DIAGNOSIS — Z0000 Encounter for general adult medical examination without abnormal findings: Secondary | ICD-10-CM | POA: Diagnosis not present

## 2018-01-24 DIAGNOSIS — E2839 Other primary ovarian failure: Secondary | ICD-10-CM | POA: Diagnosis not present

## 2018-01-24 NOTE — Patient Instructions (Addendum)
Ms. Felicia Poole , Thank you for taking time to come for your Medicare Wellness Visit. I appreciate your ongoing commitment to your health goals. Please review the following plan we discussed and let me know if I can assist you in the future.   Screening recommendations/referrals: Colorectal Screening: You will receive a call from our office regarding your appointment Mammogram: Ordered today Bone Density: Ordered today  Vision and Dental Exams: Recommended annual ophthalmology exams for early detection of glaucoma and other disorders of the eye Recommended annual dental exams for proper oral hygiene  Diabetic Exams: Recommended annual diabetic eye exams for early detection of retinopathy Recommended annual diabetic foot exams for early detection of peripheral neuropathy.  Diabetic Eye Exam: You will receive a call from our office Diabetic Foot Exam: You will receive a call from our office  Vaccinations: Influenza vaccine: Overdue Pneumococcal vaccine: Up to date Tdap vaccine: Up to date Shingles vaccine: Please call your insurance company to determine your out of pocket expense for the Shingrix vaccine. You may also receive this vaccine at your local pharmacy or Health Dept.    Advanced directives: Advance directive discussed with you today. I have provided a copy for you to complete at home and have notarized. Once this is complete please bring a copy in to our office so we can scan it into your chart.  Goals: Recommend to drink at least 6-8 8oz glasses of water per day.  Next appointment: Please schedule your Annual Wellness Visit with your Nurse Health Advisor in one year.  Preventive Care 54 Years and Older, Female Preventive care refers to lifestyle choices and visits with your health care provider that can promote health and wellness. What does preventive care include?  A yearly physical exam. This is also called an annual well check.  Dental exams once or twice a  year.  Routine eye exams. Ask your health care provider how often you should have your eyes checked.  Personal lifestyle choices, including:  Daily care of your teeth and gums.  Regular physical activity.  Eating a healthy diet.  Avoiding tobacco and drug use.  Limiting alcohol use.  Practicing safe sex.  Taking low-dose aspirin every day.  Taking vitamin and mineral supplements as recommended by your health care provider. What happens during an annual well check? The services and screenings done by your health care provider during your annual well check will depend on your age, overall health, lifestyle risk factors, and family history of disease. Counseling  Your health care provider may ask you questions about your:  Alcohol use.  Tobacco use.  Drug use.  Emotional well-being.  Home and relationship well-being.  Sexual activity.  Eating habits.  History of falls.  Memory and ability to understand (cognition).  Work and work Statistician.  Reproductive health. Screening  You may have the following tests or measurements:  Height, weight, and BMI.  Blood pressure.  Lipid and cholesterol levels. These may be checked every 5 years, or more frequently if you are over 74 years old.  Skin check.  Lung cancer screening. You may have this screening every year starting at age 75 if you have a 30-pack-year history of smoking and currently smoke or have quit within the past 15 years.  Fecal occult blood test (FOBT) of the stool. You may have this test every year starting at age 37.  Flexible sigmoidoscopy or colonoscopy. You may have a sigmoidoscopy every 5 years or a colonoscopy every 10 years starting at age  50.  Hepatitis C blood test.  Hepatitis B blood test.  Sexually transmitted disease (STD) testing.  Diabetes screening. This is done by checking your blood sugar (glucose) after you have not eaten for a while (fasting). You may have this done every 1-3  years.  Bone density scan. This is done to screen for osteoporosis. You may have this done starting at age 65.  Mammogram. This may be done every 1-2 years. Talk to your health care provider about how often you should have regular mammograms. Talk with your health care provider about your test results, treatment options, and if necessary, the need for more tests. Vaccines  Your health care provider may recommend certain vaccines, such as:  Influenza vaccine. This is recommended every year.  Tetanus, diphtheria, and acellular pertussis (Tdap, Td) vaccine. You may need a Td booster every 10 years.  Zoster vaccine. You may need this after age 78.  Pneumococcal 13-valent conjugate (PCV13) vaccine. One dose is recommended after age 15.  Pneumococcal polysaccharide (PPSV23) vaccine. One dose is recommended after age 23. Talk to your health care provider about which screenings and vaccines you need and how often you need them. This information is not intended to replace advice given to you by your health care provider. Make sure you discuss any questions you have with your health care provider. Document Released: 06/20/2015 Document Revised: 02/11/2016 Document Reviewed: 03/25/2015 Elsevier Interactive Patient Education  2017 [place] Prevention in the Home Falls can cause injuries. They can happen to people of all ages. There are many things you can do to make your home safe and to help prevent falls. What can I do on the outside of my home?  Regularly fix the edges of walkways and driveways and fix any cracks.  Remove anything that might make you trip as you walk through a door, such as a raised step or threshold.  Trim any bushes or trees on the path to your home.  Use bright outdoor lighting.  Clear any walking paths of anything that might make someone trip, such as rocks or tools.  Regularly check to see if handrails are loose or broken. Make sure that both sides of any  steps have handrails.  Any raised decks and porches should have guardrails on the edges.  Have any leaves, snow, or ice cleared regularly.  Use sand or salt on walking paths during winter.  Clean up any spills in your garage right away. This includes oil or grease spills. What can I do in the bathroom?  Use night lights.  Install grab bars by the toilet and in the tub and shower. Do not use towel bars as grab bars.  Use non-skid mats or decals in the tub or shower.  If you need to sit down in the shower, use a plastic, non-slip stool.  Keep the floor dry. Clean up any water that spills on the floor as soon as it happens.  Remove soap buildup in the tub or shower regularly.  Attach bath mats securely with double-sided non-slip rug tape.  Do not have throw rugs and other things on the floor that can make you trip. What can I do in the bedroom?  Use night lights.  Make sure that you have a light by your bed that is easy to reach.  Do not use any sheets or blankets that are too big for your bed. They should not hang down onto the floor.  Have a firm chair that  has side arms. You can use this for support while you get dressed.  Do not have throw rugs and other things on the floor that can make you trip. What can I do in the kitchen?  Clean up any spills right away.  Avoid walking on wet floors.  Keep items that you use a lot in easy-to-reach places.  If you need to reach something above you, use a strong step stool that has a grab bar.  Keep electrical cords out of the way.  Do not use floor polish or wax that makes floors slippery. If you must use wax, use non-skid floor wax.  Do not have throw rugs and other things on the floor that can make you trip. What can I do with my stairs?  Do not leave any items on the stairs.  Make sure that there are handrails on both sides of the stairs and use them. Fix handrails that are broken or loose. Make sure that handrails are  as long as the stairways.  Check any carpeting to make sure that it is firmly attached to the stairs. Fix any carpet that is loose or worn.  Avoid having throw rugs at the top or bottom of the stairs. If you do have throw rugs, attach them to the floor with carpet tape.  Make sure that you have a light switch at the top of the stairs and the bottom of the stairs. If you do not have them, ask someone to add them for you. What else can I do to help prevent falls?  Wear shoes that:  Do not have high heels.  Have rubber bottoms.  Are comfortable and fit you well.  Are closed at the toe. Do not wear sandals.  If you use a stepladder:  Make sure that it is fully opened. Do not climb a closed stepladder.  Make sure that both sides of the stepladder are locked into place.  Ask someone to hold it for you, if possible.  Clearly mark and make sure that you can see:  Any grab bars or handrails.  First and last steps.  Where the edge of each step is.  Use tools that help you move around (mobility aids) if they are needed. These include:  Canes.  Walkers.  Scooters.  Crutches.  Turn on the lights when you go into a dark area. Replace any light bulbs as soon as they burn out.  Set up your furniture so you have a clear path. Avoid moving your furniture around.  If any of your floors are uneven, fix them.  If there are any pets around you, be aware of where they are.  Review your medicines with your doctor. Some medicines can make you feel dizzy. This can increase your chance of falling. Ask your doctor what other things that you can do to help prevent falls. This information is not intended to replace advice given to you by your health care provider. Make sure you discuss any questions you have with your health care provider. Document Released: 03/20/2009 Document Revised: 10/30/2015 Document Reviewed: 06/28/2014 Elsevier Interactive Patient Education  2017 [employer].

## 2018-01-24 NOTE — Progress Notes (Signed)
Subjective:   Felicia Poole is a 72 y.o. female who presents for Medicare Annual (Subsequent) preventive examination.  Review of Systems:   N/A Cardiac Risk Factors include: advanced age (>38men, >30 women);diabetes mellitus;dyslipidemia;hypertension;obesity (BMI >30kg/m2);sedentary lifestyle     Objective:     Vitals: BP 124/68 (BP Location: Left Arm, Patient Position: Sitting, Cuff Size: Normal)   Pulse 70   Temp (!) 97.4 F (36.3 C) (Oral)   Resp 14   Ht 4\' 11"  (1.499 m)   Wt 147 lb 9.6 oz (67 kg)   SpO2 93%   BMI 29.81 kg/m   Body mass index is 29.81 kg/m.  Advanced Directives 01/24/2018 01/24/2017 10/26/2016 10/06/2015 05/08/2015 04/29/2015 12/10/2014  Does Patient Have a Medical Advance Directive? No No No No No No No  Would patient like information on creating a medical advance directive? Yes (MAU/Ambulatory/Procedural Areas - Information given) Yes (ED - Information included in AVS) - No - patient declined information No - patient declined information - No - patient declined information    Tobacco Social History   Tobacco Use  Smoking Status Never Smoker  Smokeless Tobacco Never Used  Tobacco Comment   smoking cessation materials not required     Counseling given: No Comment: smoking cessation materials not required  Clinical Intake:  Pre-visit preparation completed: Yes  Pain : No/denies pain   BMI - recorded: 29.81 Nutritional Status: BMI > 30  Obese Nutritional Risks: None  Nutrition Risk Assessment: Has the patient had any N/V/D within the last 2 months?  No Does the patient have any non-healing wounds?  No Has the patient had any unintentional weight loss or weight gain?  No  Is the patient diabetic?  Yes If diabetic, was a CBG obtained today?  No Did the patient bring in their glucometer from home?  No Comments: Pt does not monitor CBG's. Denies any financial strains with the device or supplies.  Diabetic Exams: Diabetic Eye Exam: Overdue for  diabetic eye exam. Pt has been advised about the importance in completing this exam. A referral has been placed today. Message sent to referral coordinator for scheduling purposes. Advised pt to expect a call from our office re: appt. Diabetic Foot Exam: Pt has been advised about the importance in completing this exam. A referral has been placed with Dr. Prudence Davidson for completion of diabetic foot exam and to trim toe nails.  How often do you need to have someone help you when you read instructions, pamphlets, or other written materials from your doctor or pharmacy?: 1 - Never  Interpreter Needed?: No  Information entered by :: Idell Pickles, LPN  Past Medical History:  Diagnosis Date  . Atrial myxoma    Status post resection in 2012 at [facility]  . Benign neoplasm of heart   . Contact dermatitis and other eczema, due to unspecified cause   . Contusion of unspecified site   . Coronary atherosclerosis of unspecified type of vessel, native or graft    Status post CABG in March of 2012 at the time of atrial myxoma resection with LIMA to LAD done at [facility]  . Cramp of limb   . Essential hypertension, benign   . Heartburn   . Lumbago   . Nonischemic cardiomyopathy ([place])    EF: 45% in 2012  . Nonischemic cardiomyopathy ([place])   . Osteoporosis, unspecified   . Other and unspecified hyperlipidemia   . Other specified cardiac dysrhythmias(427.89)   . Postsurgical percutaneous transluminal coronary angioplasty status   .  Type II or unspecified type diabetes mellitus without mention of complication, not stated as uncontrolled   . Unspecified hypothyroidism   . Unspecified menopausal and postmenopausal disorder    Past Surgical History:  Procedure Laterality Date  . ATRIAL MYXOMA EXCISION Left   . CARDIAC CATHETERIZATION  2012   [facility]; right and left heart 75% ostial LAD lesion  . CORONARY ARTERY BYPASS GRAFT  2011   [facility]  . INSERT / REPLACE / REMOVE PACEMAKER  02/2013   Dual chamber MDT advisa pacemaker.  [id number]  . VAGINAL HYSTERECTOMY     Family History  Problem Relation Age of Onset  . Heart failure Mother   . Cirrhosis Mother   . Hyperlipidemia Father   . Hypertension Father   . Stroke Father   . Heart attack Father    Social History   Socioeconomic History  . Marital status: Widowed    Spouse name: Gwenlyn Perking  . Number of children: 5  . Years of education: Not on file  . Highest education level: 9th grade  Occupational History    Employer: DISABLED  Social Needs  . Financial resource strain: Not hard at all  . Food insecurity:    Worry: Never true    Inability: Never true  . Transportation needs:    Medical: No    Non-medical: No  Tobacco Use  . Smoking status: Never Smoker  . Smokeless tobacco: Never Used  . Tobacco comment: smoking cessation materials not required  Substance and Sexual Activity  . Alcohol use: No  . Drug use: No  . Sexual activity: Never  Lifestyle  . Physical activity:    Days per week: 0 days    Minutes per session: 0 min  . Stress: Not at all  Relationships  . Social connections:    Talks on phone: Patient refused    Gets together: Patient refused    Attends religious service: Patient refused    Active member of club or organization: Patient refused    Attends meetings of clubs or organizations: Patient refused    Relationship status: Married  Other Topics Concern  . Not on file  Social History Narrative  . Not on file    Outpatient Encounter Medications as of 01/24/2018  Medication Sig  . amLODipine (NORVASC) 10 MG tablet TAKE 1 TABLET BY MOUTH EVERY DAY  . atorvastatin (LIPITOR) 80 MG tablet TAKE 1 TABLET BY MOUTH EVERY DAY  . chlorthalidone (HYGROTON) 25 MG tablet Take 25 mg by mouth daily.  . fluticasone (FLONASE) 50 MCG/ACT nasal spray SPRAY 1 SPRAY IN EACH NOSTRIL 2 TIMES A DAY  . furosemide (LASIX) 40 MG tablet Take 40 mg by mouth as needed.   . Gauze Pads & Dressings (ABDOMINAL PAD) 4"N82" PADS 1 application by Does not  apply route 4 (four) times daily.  . Incontinence Supply Disposable (ADHERES INCONTINENCE PAD) MISC 1 application by Does not apply route 4 (four) times daily.  Marland Kitchen levothyroxine (SYNTHROID, LEVOTHROID) 88 MCG tablet Take 1 tablet (88 mcg total) by mouth daily. Get labs on 11/25/2017 for further refills.  Marland Kitchen lisinopril (PRINIVIL,ZESTRIL) 20 MG tablet TAKE 1 TABLET (20 MG TOTAL) BY MOUTH DAILY.  Marland Kitchen nystatin (MYCOSTATIN/NYSTOP) powder Apply topically 2 (two) times daily.  . sotalol (BETAPACE) 80 MG tablet Take 80 mg by mouth 2 (two) times daily.  Marland Kitchen terbinafine (LAMISIL AT) 1 % cream Apply 1 application topically 2 (two) times daily.  Marland Kitchen warfarin (COUMADIN) 2.5 MG tablet Take as directed by Coumadin  Clinic  . alendronate (FOSAMAX) 70 MG tablet Take 70 mg by mouth once a week.   . Calcium Carb-Cholecalciferol (CALCIUM 500+D) 500-200 MG-UNIT TABS Take 1 tablet by mouth daily. (Patient not taking: Reported on 01/24/2018)  . meloxicam (MOBIC) 15 MG tablet Take 1 tablet (15 mg total) by mouth daily. (Patient not taking: Reported on 01/24/2018)  . olopatadine (PATANOL) 0.1 % ophthalmic solution Place 1 drop into both eyes 2 (two) times daily. (Patient not taking: Reported on 01/24/2018)  . ranitidine (ZANTAC) 300 MG tablet TAKE 1 TABLET BY MOUTH EVERYDAY AT BEDTIME (Patient not taking: Reported on 01/24/2018)  . traMADol (ULTRAM) 50 MG tablet Take 1 tablet (50 mg total) by mouth every 12 (twelve) hours as needed for moderate pain. (Patient not taking: Reported on 01/24/2018)  . Vitamin D, Ergocalciferol, (DRISDOL) 50000 units CAPS capsule Take by mouth.   No facility-administered encounter medications on file as of 01/24/2018.     Activities of Daily Living In your present state of health, do you have any difficulty performing the following activities: 01/24/2018 10/11/2017  Hearing? N N  Comment denies hearing aids -  Vision? N Y  Comment denies eyeglasses -  Difficulty concentrating or making decisions? N Y    Walking or climbing stairs? Y Y  Comment dyspnea -  Dressing or bathing? N Y  Doing errands, shopping? Y Y  Comment dtr transports -  Conservation officer, nature and eating ? N -  Comment denies dentures -  Using the Toilet? N -  In the past six months, have you accidently leaked urine? N -  Do you have problems with loss of bowel control? N -  Managing your Medications? N -  Managing your Finances? N -  Housekeeping or managing your Housekeeping? N -  Some recent data might be hidden    Patient Care Team: Steele Sizer, MD as PCP - General (Family Medicine) Deboraha Sprang, MD as Consulting Physician (Cardiology) Wellington Hampshire, MD as Consulting Physician (Cardiology) Gardiner Barefoot, DPM as Consulting Physician (Podiatry)    Assessment:   This is a routine wellness examination for [facility].  Exercise Activities and Dietary recommendations Current Exercise Habits: The patient does not participate in regular exercise at present, Exercise limited by: None identified  Goals    . Cut out extra servings     Recommend cutting out extra servings and snacking in between meals.     Marland Kitchen DIET - INCREASE WATER INTAKE     Recommend to drink at least 6-8 8oz glasses of water per day.       Fall Risk Fall Risk  01/24/2018 10/11/2017 05/03/2017 01/24/2017 10/26/2016  Falls in the past year? Yes No No No No  Comment fell getting out of church bus - - - -  Number falls in past yr: 1 - - - -  Injury with Fall? No - - - -  Risk for fall due to : Impaired vision;Medication side effect - - - -  Risk for fall due to: Comment wears eyeglasses - - - -  Follow up Falls evaluation completed;Education provided;Falls prevention discussed - - - -   FALL RISK PREVENTION PERTAINING TO HOME: Is your home free of loose throw rugs in walkways, pet beds, electrical cords, etc? Yes Is there adequate lighting in your home to reduce risk of falls?  Yes Are there stairs in or around your home WITH handrails?  Yes  ASSISTIVE DEVICES UTILIZED TO PREVENT FALLS: Use of a cane, walker  or w/c? No Grab bars in the bathroom? No  Shower chair or a place to sit while bathing? Yes An elevated toilet seat or a handicapped toilet? Yes  Timed Get Up and Go Performed: Yes. Pt ambulated 10 feet within 29 sec. Gait slow, steady and without the use of an assistive device. No intervention required at this time. Fall risk prevention has been discussed.  Community Resource Referral:  Pt declined my offer to send [employer] Referral to Care Guide for installation of grab bars in the shower.  Depression Screen PHQ 2/9 Scores 01/24/2018 05/03/2017 01/24/2017 10/26/2016  PHQ - 2 Score 0 1 1 0  PHQ- 9 Score 0 - - -     Cognitive Function     6CIT Screen 01/24/2018  What Year? 0 points  What month? 0 points  What time? 0 points  Count back from 20 2 points  Months in reverse 4 points  Repeat phrase 8 points  Total Score 14    Immunization History  Administered Date(s) Administered  . Influenza, High Dose Seasonal PF 04/29/2015  . Pneumococcal Conjugate-13 04/29/2015  . Pneumococcal Polysaccharide-23 02/17/2011  . Tdap 04/28/2012    Qualifies for Shingles Vaccine? Yes. Due for Shingrix. Education has been provided regarding the importance of this vaccine. Pt has been advised to call insurance company to determine out of pocket expense. Advised may also receive vaccine at local pharmacy or Health Dept. Verbalized acceptance and understanding.  Overdue for Flu vaccine. Education has been provided regarding the importance of this vaccine and advised to receive when available. Verbalized acceptance and understanding.  Screening Tests Health Maintenance  Topic Date Due  . FOOT EXAM  03/16/1956  . OPHTHALMOLOGY EXAM  03/16/1956  . MAMMOGRAM  03/16/1963  . INFLUENZA VACCINE  01/05/2018  . HEMOGLOBIN A1C  04/13/2018  . TETANUS/TDAP  04/28/2022  . COLONOSCOPY  08/31/2022  . DEXA SCAN  Completed  .  Hepatitis C Screening  Completed  . PNA vac Low Risk Adult  Completed    Cancer Screenings: Lung: Low Dose CT Chest recommended if Age 45-80 years, 30 pack-year currently smoking OR have quit w/in 15years. Patient does not qualify. Breast:  Up to date on Mammogram? No. Ordered today. Provided with contact information and advised to schedule appt.  Up to date of Bone Density/Dexa? No. Completed 11/22/12. Results reflect osteoporosis and osteopenia. Repeat every 2 years. Ordered today. Provided with contact information and advised to schedule appt. Colorectal: In need of repeat d/t poor prep. Referral placed for pt to be seen by Dr. Jamal Collin for repeat colonoscopy.  Additional Screenings: Hepatitis C Screening: Completed 07/11/12    Plan:  I have personally reviewed and addressed the Medicare Annual Wellness questionnaire and have noted the following in the patient's chart:  A. Medical and social history B. Use of alcohol, tobacco or illicit drugs  C. Current medications and supplements D. Functional ability and status E.  Nutritional status F.  Physical activity G. Advance directives H. List of other physicians I.  Hospitalizations, surgeries, and ER visits in previous 12 months J.  [place] such as hearing and vision if needed, cognitive and depression L. Referrals and appointments  In addition, I have reviewed and discussed with patient certain preventive protocols, quality metrics, and best practice recommendations. A written personalized care plan for preventive services as well as general preventive health recommendations were provided to patient.  See attached scanned questionnaire for additional information.   Signed,  Lennox Leikam,  LPN Nurse Health Advisor

## 2018-01-25 ENCOUNTER — Telehealth: Payer: Self-pay | Admitting: Family Medicine

## 2018-01-25 ENCOUNTER — Ambulatory Visit (INDEPENDENT_AMBULATORY_CARE_PROVIDER_SITE_OTHER): Payer: Medicare Other

## 2018-01-25 DIAGNOSIS — I4891 Unspecified atrial fibrillation: Secondary | ICD-10-CM

## 2018-01-25 DIAGNOSIS — Z5181 Encounter for therapeutic drug level monitoring: Secondary | ICD-10-CM | POA: Diagnosis not present

## 2018-01-25 LAB — POCT INR: INR: 2.2 (ref 2.0–3.0)

## 2018-01-25 NOTE — Telephone Encounter (Signed)
Copied from [place] [phone]. Topic: Quick Communication - See Telephone Encounter >> Jan 25, 2018  9:26 AM Nils Flack wrote: CRM for notification. See Telephone encounter for: 01/25/18. Referral is placed for Dr Jamal Collin, and GI.  Dr Jamal Collin is Psychologist, sport and exercise.  Do you want referral to go to Dr Thurman Coyer or to GI? Thanks

## 2018-01-25 NOTE — Patient Instructions (Signed)
Please continue dosage of 3 pills everyday except 4 pills on Mondays and Fridays. Please pick day each week to have your greens and eat them every week on that day. Recheck INR in 4 weeks.

## 2018-01-27 ENCOUNTER — Other Ambulatory Visit: Payer: Self-pay

## 2018-01-27 DIAGNOSIS — E119 Type 2 diabetes mellitus without complications: Secondary | ICD-10-CM

## 2018-01-30 ENCOUNTER — Ambulatory Visit: Payer: Medicare Other | Admitting: Family Medicine

## 2018-01-30 NOTE — Telephone Encounter (Signed)
following up on where Dr Ancil Boozer wants this pt to go.

## 2018-01-31 ENCOUNTER — Other Ambulatory Visit: Payer: Self-pay

## 2018-01-31 DIAGNOSIS — Z1211 Encounter for screening for malignant neoplasm of colon: Secondary | ICD-10-CM

## 2018-01-31 NOTE — Telephone Encounter (Signed)
Please advise 

## 2018-01-31 NOTE — Telephone Encounter (Signed)
Information has been changed to  GI.

## 2018-01-31 NOTE — Telephone Encounter (Signed)
Aimee that placed referral. It is for GI. Dr. Jamal Collin no longer in practice

## 2018-02-16 ENCOUNTER — Telehealth: Payer: Self-pay | Admitting: Gastroenterology

## 2018-02-16 NOTE — Telephone Encounter (Signed)
Pt daughter  Is calling to r/s her procedure due to not stopping a rx please call Felicia Poole ok to leave her vm

## 2018-02-17 NOTE — Telephone Encounter (Signed)
Left vm request [name] to return my call to reschedule procedure.

## 2018-02-20 ENCOUNTER — Telehealth: Payer: Self-pay | Admitting: Gastroenterology

## 2018-02-20 NOTE — Telephone Encounter (Signed)
Left vm again today for pt's daughter to return my call to possibly reschedule procedure.

## 2018-02-20 NOTE — Telephone Encounter (Signed)
Felicia Poole is returning a call to r/s pt apt

## 2018-02-21 ENCOUNTER — Other Ambulatory Visit: Payer: Self-pay

## 2018-02-21 ENCOUNTER — Ambulatory Visit: Admission: RE | Admit: 2018-02-21 | Payer: Medicare Other | Source: Ambulatory Visit | Admitting: Gastroenterology

## 2018-02-21 ENCOUNTER — Encounter: Admission: RE | Payer: Self-pay | Source: Ambulatory Visit

## 2018-02-21 SURGERY — COLONOSCOPY WITH PROPOFOL
Anesthesia: General

## 2018-02-21 NOTE — Telephone Encounter (Signed)
No return call from pt's daughter. Messages left. Mailed letter requesting a return call.

## 2018-02-22 ENCOUNTER — Ambulatory Visit (INDEPENDENT_AMBULATORY_CARE_PROVIDER_SITE_OTHER): Payer: Medicare Other

## 2018-02-22 DIAGNOSIS — I4891 Unspecified atrial fibrillation: Secondary | ICD-10-CM

## 2018-02-22 DIAGNOSIS — Z5181 Encounter for therapeutic drug level monitoring: Secondary | ICD-10-CM | POA: Diagnosis not present

## 2018-02-22 LAB — POCT INR: INR: 1.8 — AB (ref 2.0–3.0)

## 2018-02-22 NOTE — Patient Instructions (Signed)
Please take 4 pills tonight, then continue dosage of 3 pills everyday except 4 pills on Mondays and Fridays. Please pick day each week to have your greens and eat them every week on that day. Recheck INR in 3 weeks.

## 2018-02-27 ENCOUNTER — Other Ambulatory Visit: Payer: Medicare Other

## 2018-03-04 ENCOUNTER — Other Ambulatory Visit: Payer: Self-pay | Admitting: Cardiovascular Disease

## 2018-03-06 NOTE — Telephone Encounter (Signed)
Refill Request.  

## 2018-03-15 ENCOUNTER — Ambulatory Visit (INDEPENDENT_AMBULATORY_CARE_PROVIDER_SITE_OTHER): Payer: Medicare Other

## 2018-03-15 DIAGNOSIS — I4891 Unspecified atrial fibrillation: Secondary | ICD-10-CM | POA: Diagnosis not present

## 2018-03-15 DIAGNOSIS — Z5181 Encounter for therapeutic drug level monitoring: Secondary | ICD-10-CM | POA: Diagnosis not present

## 2018-03-15 LAB — POCT INR: INR: 1.9 — AB (ref 2.0–3.0)

## 2018-03-15 MED ORDER — WARFARIN SODIUM 5 MG PO TABS: ORAL_TABLET | ORAL | 0 refills | Status: DC

## 2018-03-15 NOTE — Patient Instructions (Signed)
PICK UP NEW 5 MG PILLS. Please START NEW DOSAGE of 1.5 pills everyday except 2 pills on MONDAYS, [place]. Please pick day each week to have your greens and eat them every week on that day. Recheck INR in 3 weeks.

## 2018-03-17 ENCOUNTER — Other Ambulatory Visit: Payer: Self-pay | Admitting: Cardiovascular Disease

## 2018-03-17 ENCOUNTER — Telehealth: Payer: Self-pay | Admitting: Family Medicine

## 2018-03-17 MED ORDER — SOTALOL HCL 80 MG PO TABS: 80.0000 mg | ORAL_TABLET | Freq: Two times a day (BID) | ORAL | 2 refills | Status: DC

## 2018-03-17 NOTE — Telephone Encounter (Signed)
Copied from [place] [phone]. Topic: Quick Communication - Rx Refill/Question >> Mar 17, 2018  1:34 PM Oliver Pila B wrote: Pt daughter called stating the pt needs ALL her medications; no one could give an answer on a name on any of the medications that were listed; [facility] was told to request all medications; contact pt or someone on DPR to have them know medications are needing to be specified, [facility] was unable to get names of what is needed  Medication: amLODipine (NORVASC) 10 MG tablet [[id number]]  atorvastatin (LIPITOR) 80 MG tablet [[id number]]  Calcium Carb-Cholecalciferol (CALCIUM 500+D) 500-200 MG-UNIT TABS [[id number]]  fluticasone (FLONASE) 50 MCG/ACT nasal spray [[id number]]  Gauze Pads & Dressings (ABDOMINAL PAD) 8"X10" PADS [[id number]]  Incontinence Supply Disposable (ADHERES INCONTINENCE PAD) MISC [[id number]]  levothyroxine (SYNTHROID, LEVOTHROID) 88 MCG tablet [[id number]]  nystatin (MYCOSTATIN/NYSTOP) powder [[id number]]  olopatadine (PATANOL) 0.1 % ophthalmic solution [[id number]]  ranitidine (ZANTAC) 300 MG tablet [[id number]]    [facility]erred Pharm[facility] (with phone number [facility] street [facility]): [facility]  Agent: Please be advised that RX refills may take up to 3 business days. We ask that you follow-up with your pharmacy.

## 2018-03-17 NOTE — Telephone Encounter (Signed)
°*  STAT* If patient is at the pharmacy, call can be transferred to refill team. ° ° °1. Which medications need to be refilled? (please list name of each medication and dose if known)   ° °  Sotalol 80 mg po BID  ° °2. Which pharmacy/location (including street and city if local pharmacy) is medication [facility] sent [facility]  ° °3. Do they need a 30 day or 90 day supply? 90  ° ° °

## 2018-03-20 ENCOUNTER — Ambulatory Visit: Payer: Medicare Other | Admitting: Family Medicine

## 2018-03-21 ENCOUNTER — Other Ambulatory Visit: Payer: Medicare Other

## 2018-04-06 ENCOUNTER — Other Ambulatory Visit: Payer: Self-pay | Admitting: Cardiovascular Disease

## 2018-04-06 NOTE — Telephone Encounter (Signed)
Please review for refills.

## 2018-04-07 NOTE — Telephone Encounter (Signed)
Refill sent for 5mg  tablets based on most recent instructions.

## 2018-04-17 ENCOUNTER — Ambulatory Visit (INDEPENDENT_AMBULATORY_CARE_PROVIDER_SITE_OTHER): Payer: Medicare Other

## 2018-04-17 DIAGNOSIS — Z5181 Encounter for therapeutic drug level monitoring: Secondary | ICD-10-CM

## 2018-04-17 DIAGNOSIS — I4891 Unspecified atrial fibrillation: Secondary | ICD-10-CM

## 2018-04-17 LAB — POCT INR: INR: 2.7 (ref 2.0–3.0)

## 2018-04-17 NOTE — Patient Instructions (Signed)
Please continue dosage of 1.5 pills everyday except 2 pills on MONDAYS, [place]. Please pick day each week to have your greens and eat them every week on that day. Recheck INR in 4 weeks.

## 2018-04-19 ENCOUNTER — Other Ambulatory Visit: Payer: Medicare Other

## 2018-04-22 ENCOUNTER — Other Ambulatory Visit: Payer: Self-pay | Admitting: Cardiovascular Disease

## 2018-04-24 NOTE — Telephone Encounter (Signed)
Refill Request.  

## 2018-05-15 ENCOUNTER — Ambulatory Visit (INDEPENDENT_AMBULATORY_CARE_PROVIDER_SITE_OTHER): Payer: Medicare Other

## 2018-05-15 DIAGNOSIS — I4891 Unspecified atrial fibrillation: Secondary | ICD-10-CM

## 2018-05-15 DIAGNOSIS — Z5181 Encounter for therapeutic drug level monitoring: Secondary | ICD-10-CM | POA: Diagnosis not present

## 2018-05-15 LAB — POCT INR: INR: 2.1 (ref 2.0–3.0)

## 2018-05-15 NOTE — Patient Instructions (Signed)
Please continue dosage of 1.5 pills everyday except 2 pills on MONDAYS, [place]. Please pick day each week to have your greens and eat them every week on that day. Recheck INR in 5 weeks.

## 2018-06-02 ENCOUNTER — Telehealth: Payer: Self-pay | Admitting: Family Medicine

## 2018-06-02 DIAGNOSIS — N393 Stress incontinence (female) (male): Secondary | ICD-10-CM

## 2018-06-02 MED ORDER — ADHERES INCONTINENCE PAD MISC: 1.0000 "application " | Freq: Four times a day (QID) | 6 refills | Status: DC

## 2018-06-02 NOTE — Telephone Encounter (Signed)
Please fax, printed.  Thank you

## 2018-06-02 NOTE — Telephone Encounter (Signed)
Copied from [place] [phone]. Topic: General - Other >> Jun 02, 2018 11:14 AM Lennox Solders wrote: Reason for CRM: pt needs refill on incontinence pads and brief fax order to [facility].fax number [phone] and phone number [phone]. Address [address] in [place]

## 2018-06-02 NOTE — Telephone Encounter (Signed)
Done

## 2018-06-12 ENCOUNTER — Other Ambulatory Visit: Payer: Self-pay | Admitting: Cardiovascular Disease

## 2018-06-12 ENCOUNTER — Other Ambulatory Visit: Payer: Medicare Other

## 2018-06-21 ENCOUNTER — Ambulatory Visit (INDEPENDENT_AMBULATORY_CARE_PROVIDER_SITE_OTHER): Payer: Medicare Other

## 2018-06-21 DIAGNOSIS — Z5181 Encounter for therapeutic drug level monitoring: Secondary | ICD-10-CM | POA: Diagnosis not present

## 2018-06-21 DIAGNOSIS — I4891 Unspecified atrial fibrillation: Secondary | ICD-10-CM | POA: Diagnosis not present

## 2018-06-21 LAB — POCT INR: INR: 3.7 — AB (ref 2.0–3.0)

## 2018-06-21 NOTE — Patient Instructions (Signed)
Please skip coumadin tonight, then continue dosage of 1.5 pills everyday except 2 pills on MONDAYS, [place]. Please pick day each week to have your greens and eat them every week on that day. Recheck INR in 2 weeks.

## 2018-07-24 ENCOUNTER — Ambulatory Visit (INDEPENDENT_AMBULATORY_CARE_PROVIDER_SITE_OTHER): Payer: Medicare Other

## 2018-07-24 ENCOUNTER — Other Ambulatory Visit
Admission: RE | Admit: 2018-07-24 | Discharge: 2018-07-24 | Disposition: A | Discharge status: Home / Self Care | Payer: Medicare Other | Source: Ambulatory Visit | Attending: Cardiovascular Disease | Admitting: Cardiovascular Disease

## 2018-07-24 DIAGNOSIS — R791 Abnormal coagulation profile: Secondary | ICD-10-CM | POA: Diagnosis not present

## 2018-07-24 DIAGNOSIS — Z5181 Encounter for therapeutic drug level monitoring: Secondary | ICD-10-CM | POA: Diagnosis not present

## 2018-07-24 DIAGNOSIS — I4891 Unspecified atrial fibrillation: Secondary | ICD-10-CM

## 2018-07-24 LAB — PROTIME-INR
INR: 10.01
Prothrombin Time: 78.1 seconds — ABNORMAL HIGH (ref 11.4–15.2)

## 2018-07-24 LAB — POCT INR: INR: 8 — AB (ref 2.0–3.0)

## 2018-07-24 NOTE — Patient Instructions (Addendum)
PLEASE PROCEED TO THE [facility] FOR STAT LABS. DO NOT TAKE COUMADIN UNTIL YOU HEAR FROM OUR OFFICE WITH INSTRUCTIONS. Have a large serving of greens today. Recheck INR in 1 week. Received call from [facility] lab w/ pt's results of 10.01.   Spoke w/ pt's daughter and advised her to have pt eat a LARGE serving of greens today, that pharmacist recommends she have a can of spinach, continue holding coumadin until Monday.  Renato Gails that b/c pt no shows frequently, to call me if she will be unable to come in and we can get her seen in [place] if she cannot come in here.   She verbalizes understanding and will take pt to ED if she has any signs or symptoms of bleeding.

## 2018-07-31 ENCOUNTER — Ambulatory Visit (INDEPENDENT_AMBULATORY_CARE_PROVIDER_SITE_OTHER): Payer: Medicare Other

## 2018-07-31 DIAGNOSIS — I4891 Unspecified atrial fibrillation: Secondary | ICD-10-CM | POA: Diagnosis not present

## 2018-07-31 DIAGNOSIS — Z5181 Encounter for therapeutic drug level monitoring: Secondary | ICD-10-CM

## 2018-07-31 LAB — POCT INR: INR: 2.1 (ref 2.0–3.0)

## 2018-07-31 NOTE — Patient Instructions (Signed)
Please start new dosage of 1 peach colored tablet every day.  Recheck in 2 weeks.

## 2018-08-10 ENCOUNTER — Other Ambulatory Visit: Payer: Self-pay | Admitting: Cardiovascular Disease

## 2018-08-10 NOTE — Telephone Encounter (Signed)
Please review for refill, Thanks !  

## 2018-08-14 ENCOUNTER — Ambulatory Visit (INDEPENDENT_AMBULATORY_CARE_PROVIDER_SITE_OTHER): Payer: Medicare Other

## 2018-08-14 DIAGNOSIS — I4891 Unspecified atrial fibrillation: Secondary | ICD-10-CM | POA: Diagnosis not present

## 2018-08-14 DIAGNOSIS — Z5181 Encounter for therapeutic drug level monitoring: Secondary | ICD-10-CM

## 2018-08-14 LAB — POCT INR: INR: 8 — AB (ref 2.0–3.0)

## 2018-08-14 NOTE — Progress Notes (Signed)
Pt was previously taking the wrong dosage, now that she's on the correct dosage, it is too much warfarin for her.  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 Pt cannot come in back any sooner than next week for recheck. Advised her to hold coumadin until recheck in 1 week and we will reduce her dosage at that time.

## 2018-08-14 NOTE — Patient Instructions (Signed)
Please hold coumadin until recheck next week.  We will reduce your dosage at that time.

## 2018-08-15 ENCOUNTER — Encounter: Payer: Medicare Other | Admitting: Internal Medicine

## 2018-08-16 ENCOUNTER — Encounter: Payer: Self-pay | Admitting: Internal Medicine

## 2018-08-23 ENCOUNTER — Other Ambulatory Visit: Payer: Self-pay

## 2018-08-23 ENCOUNTER — Ambulatory Visit (INDEPENDENT_AMBULATORY_CARE_PROVIDER_SITE_OTHER): Payer: Medicare Other

## 2018-08-23 DIAGNOSIS — Z5181 Encounter for therapeutic drug level monitoring: Secondary | ICD-10-CM

## 2018-08-23 DIAGNOSIS — I4891 Unspecified atrial fibrillation: Secondary | ICD-10-CM

## 2018-08-23 LAB — POCT INR: INR: 1.1 — AB (ref 2.0–3.0)

## 2018-08-23 NOTE — Patient Instructions (Signed)
START NEW DOSAGE of 1/2 tablet every day. Recheck in 2 weeks.

## 2018-08-29 ENCOUNTER — Other Ambulatory Visit: Payer: Self-pay

## 2018-08-29 MED ORDER — FUROSEMIDE 40 MG PO TABS: 40.0000 mg | ORAL_TABLET | ORAL | 3 refills | Status: DC | PRN

## 2018-08-29 NOTE — Telephone Encounter (Signed)
Please review for refill.  

## 2018-08-30 MED ORDER — WARFARIN SODIUM 5 MG PO TABS: ORAL_TABLET | ORAL | 0 refills | Status: DC

## 2018-08-31 ENCOUNTER — Other Ambulatory Visit: Payer: Self-pay | Admitting: Internal Medicine

## 2018-08-31 ENCOUNTER — Telehealth: Payer: Self-pay

## 2018-08-31 MED ORDER — SOTALOL HCL 80 MG PO TABS: 80.0000 mg | ORAL_TABLET | Freq: Two times a day (BID) | ORAL | 2 refills | Status: DC

## 2018-08-31 NOTE — Addendum Note (Signed)
Addended by: Alba Destine on: 08/31/2018 03:09 PM   Modules accepted: Orders

## 2018-08-31 NOTE — Telephone Encounter (Signed)
°*  STAT* If patient is at the pharmacy, call can be transferred to refill team.   1. Which medications need to be refilled? (please list name of each medication and dose if known)      Sotalol 80 mg po BID   2. Which pharmacy/location (including street and city if local pharmacy) is [facility]   3. Do they need a 30 day or 90 day supply? [place]

## 2018-08-31 NOTE — Telephone Encounter (Signed)
Spoke with patient she clarified that she takes Sotolol 80 mg twice a day. Rx sent to pharmacy.

## 2018-08-31 NOTE — Telephone Encounter (Signed)
Error

## 2018-09-05 ENCOUNTER — Telehealth: Payer: Self-pay

## 2018-09-05 NOTE — Telephone Encounter (Signed)
lmov to complete travel screen for appt tomorrow

## 2018-09-06 ENCOUNTER — Ambulatory Visit (INDEPENDENT_AMBULATORY_CARE_PROVIDER_SITE_OTHER): Payer: Medicare Other

## 2018-09-06 ENCOUNTER — Other Ambulatory Visit: Payer: Self-pay

## 2018-09-06 DIAGNOSIS — I4891 Unspecified atrial fibrillation: Secondary | ICD-10-CM | POA: Diagnosis not present

## 2018-09-06 DIAGNOSIS — Z5181 Encounter for therapeutic drug level monitoring: Secondary | ICD-10-CM

## 2018-09-06 LAB — POCT INR: INR: 2.4 (ref 2.0–3.0)

## 2018-09-06 NOTE — Patient Instructions (Signed)
Please continue dosage of 1/2 tablet every day. Recheck in 4 weeks.

## 2018-09-07 ENCOUNTER — Telehealth: Payer: Self-pay

## 2018-09-07 NOTE — Telephone Encounter (Signed)
Patient returning call, please call back.

## 2018-09-07 NOTE — Telephone Encounter (Signed)
LMOV for patient to call back.  Need to change appointment to Evisit.  

## 2018-09-07 NOTE — Telephone Encounter (Signed)
Patient called back consenting to a video visit.      Virtual Visit Pre-Appointment Phone Call  Steps For Call:  1. Confirm consent - "In the setting of the current Covid19 crisis, you are scheduled for a TELEPHONE visit with your provider on 09/19/2018 at 2:20PM.  Just as we do with many in-office visits, in order for you to participate in this visit, we must obtain consent.  If you'd like, I can send this to your mychart (if signed up) or email for you to review.  Otherwise, I can obtain your verbal consent now.  All virtual visits are billed to your insurance company just like a normal visit would be.  By agreeing to a virtual visit, we'd like you to understand that the technology does not allow for your provider to perform an examination, and thus may limit your provider's ability to fully assess your condition.  Finally, though the technology is pretty good, we cannot assure that it will always work on either your or our end, and in the setting of a video visit, we may have to convert it to a phone-only visit.  In either situation, we cannot ensure that we have a secure connection.  Are you willing to proceed?"  2. Give patient instructions for WebEx download to smartphone as below if video visit  3. Advise patient to be prepared with any vital sign or heart rhythm information, their current medicines, and a piece of paper and pen handy for any instructions they may receive the day of their visit  4. Inform patient they will receive a phone call 15 minutes prior to their appointment time (may be from unknown caller ID) so they should be prepared to answer  5. Confirm that appointment type is correct in Epic appointment notes (video vs telephone)    TELEPHONE CALL NOTE  Bennetta Rudden has been deemed a candidate for a follow-up tele-health visit to limit community exposure during the Covid-19 pandemic. I spoke with the patient via phone to ensure availability of phone/video source,  confirm preferred email & phone number, and discuss instructions and expectations.  I reminded Morghan Kester to be prepared with any vital sign and/or heart rhythm information that could potentially be obtained via home monitoring, at the time of her visit. I reminded Carlia Bomkamp to expect a phone call at the time of her visit if her visit.  Did the patient verbally acknowledge consent to treatment? [place], [place] 09/07/2018 1:13 PM  CONSENT FOR TELE-HEALTH VISIT - PLEASE REVIEW  I hereby voluntarily request, consent and authorize [facility] and its employed or contracted physicians, physician assistants, nurse practitioners or other licensed health care professionals (the Practitioner), to provide me with telemedicine health care services (the Services") as deemed necessary by the treating Practitioner. I acknowledge and consent to receive the Services by the Practitioner via telemedicine. I understand that the telemedicine visit will involve communicating with the Practitioner through live audiovisual communication technology and the disclosure of certain medical information by electronic transmission. I acknowledge that I have been given the opportunity to request an in-person assessment or other available alternative prior to the telemedicine visit and am voluntarily participating in the telemedicine visit.  I understand that I have the right to withhold or withdraw my consent to the use of telemedicine in the course of my care at any time, without affecting my right to future care or treatment, and that the Practitioner or I may terminate the telemedicine visit  at any time. I understand that I have the right to inspect all information obtained and/or recorded in the course of the telemedicine visit and may receive copies of available information for a reasonable fee.  I understand that some of the potential risks of receiving the Services via telemedicine include:   Delay or  interruption in medical evaluation due to technological equipment failure or disruption;  Information transmitted may not be sufficient (e.g. poor resolution of images) to allow for appropriate medical decision making by the Practitioner; and/or   In rare instances, security protocols could fail, causing a breach of personal health information.  Furthermore, I acknowledge that it is my responsibility to provide information about my medical history, conditions and care that is complete and accurate to the best of my ability. I acknowledge that Practitioner's advice, recommendations, and/or decision may be based on factors not within their control, such as incomplete or inaccurate data provided by me or distortions of diagnostic images or specimens that may result from electronic transmissions. I understand that the practice of medicine is not an exact science and that Practitioner makes no warranties or guarantees regarding treatment outcomes. I acknowledge that I will receive a copy of this consent concurrently upon execution via email to the email address I last provided but may also request a printed copy by calling the office of [facility].    I understand that my insurance will be billed for this visit.   I have read or had this consent read to me.  I understand the contents of this consent, which adequately explains the benefits and risks of the Services being provided via telemedicine.   I have been provided ample opportunity to ask questions regarding this consent and the Services and have had my questions answered to my satisfaction.  I give my informed consent for the services to be provided through the use of telemedicine in my medical care  By participating in this telemedicine visit I agree to the above.

## 2018-09-07 NOTE — Telephone Encounter (Signed)
Called patient and scheduled a video visit

## 2018-09-18 ENCOUNTER — Telehealth: Payer: Self-pay

## 2018-09-18 NOTE — Telephone Encounter (Signed)
lmov for patient to call back.  Would like to go over how tomorrows visit will go.

## 2018-09-18 NOTE — Telephone Encounter (Signed)
Patient called back.  After speaking to patient she stated she did not have a smartphone.  Made her aware that Dr. Rockey Situ would give her a call at her appointment time tomorrow.

## 2018-09-18 NOTE — Progress Notes (Addendum)
Virtual Visit via Video Note   This visit type was conducted due to national recommendations for restrictions regarding the COVID-19 Pandemic (e.g. social distancing) in an effort to limit this patient's exposure and mitigate transmission in our community.  Due to her co-morbid illnesses, this patient is at least at moderate risk for complications without adequate follow up.  This format is felt to be most appropriate for this patient at this time.  All issues noted in this document were discussed and addressed.  A limited physical exam was performed with this format.  Please refer to the patient's chart for her consent to telehealth for [facility].   Date:  09/18/2018   ID:  Felicia Poole, DOB 1945/06/16, MRN [id number]  Patient Location:  [address] [place] [address]   Provider location:   [facility], [place] office  PCP:  Steele Sizer, MD  Cardiologist:  Arvid Right [facility]  Chief Complaint:  Leg edema, Tired  Previously seen by EP, Dr. Caryl Comes  New patient to the clinic  History of Present Illness:    Felicia Poole is a 73 y.o. female who presents via Engineer, civil (consulting) for a telehealth visit today.   The patient does not symptoms concerning for COVID-19 infection (fever, chills, cough, or new SHORTNESS OF BREATH).   Patient has a past medical history of pacemaker [employer]   atrial fibrillation  post termination pauses.  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  nonischemic cardiomyopathy with ejection fraction 45%  Medication noncompliance Who presents to establish care in the [place] office, history of coronary disease prior bypass pacer  Daughter on the evisit with her today Patient was not taking her medications for many months Appeared that back in May 2019 thyroid number was off cholesterol number was high Perhaps was not taking medications  back then Reports that beginning of the year not feeling as well had some leg swelling At the encouragement of her daughter she restarted her medications approximately 2 months ago Breathing ok, leg swelling improved  Currently with no edema, Edema seems to resolve with Lasix as needed Takes lasix once a week Takes chlorthalidone daily  No chest pain No PND orthopnea  Needs pacer check, last in 06/2017 No recent office visits with Dr. Caryl Comes  Last set of full lab work May 2019 She does come in for INR check with our office  Very sedentary, no regular exercise program  Prior CV studies:   The following studies were reviewed today:  Echocardiogram August 2014 ejection fraction 45 to 50%  Other past medical history reviewed nuclear test March 2014 by Dr. Clayborn Bigness that apparently showed no ischemia and normal left ventricular function.   DATE TEST    8/14 Echo    EF 45-50 %         Husband passed away last summer.  They have been married 50+ years.   Past Medical History:  Diagnosis Date  . Atrial myxoma    Status post resection in 2012 at [facility]  . Benign neoplasm of heart   . Contact dermatitis and other eczema, due to unspecified cause   . Contusion of unspecified site   . Coronary atherosclerosis of unspecified type of vessel, native or graft    Status post CABG in March of 2012 at the time of atrial myxoma resection with LIMA to LAD done at [facility]  . Cramp of  limb   . Essential hypertension, benign   . Heartburn   . Lumbago   . Nonischemic cardiomyopathy ([place])    EF: 45% in 2012  . Nonischemic cardiomyopathy ([place])   . Osteoporosis, unspecified   . Other and unspecified hyperlipidemia   . Other specified cardiac dysrhythmias(427.89)   . Postsurgical percutaneous transluminal coronary angioplasty status   . Type II or unspecified type diabetes mellitus without mention of complication, not stated as uncontrolled   . Unspecified hypothyroidism   . Unspecified  menopausal and postmenopausal disorder    Past Surgical History:  Procedure Laterality Date  . ATRIAL MYXOMA EXCISION Left   . CARDIAC CATHETERIZATION  2012   Unc; right and left heart 75% ostial LAD lesion  . CORONARY ARTERY BYPASS GRAFT  2011   [facility]  . INSERT / REPLACE / REMOVE PACEMAKER  02/2013   Dual chamber MDT advisa pacemaker. [id number]  . VAGINAL HYSTERECTOMY       No outpatient medications have been marked as taking for the 09/19/18 encounter (Appointment) with Minna Merritts, MD.     Allergies:   Patient has no known allergies.   Social History   Tobacco Use  . Smoking status: Never Smoker  . Smokeless tobacco: Never Used  . Tobacco comment: smoking cessation materials not required  Substance Use Topics  . Alcohol use: No  . Drug use: No     Current Outpatient Medications on File Prior to Visit  Medication Sig Dispense Refill  . alendronate (FOSAMAX) 70 MG tablet Take 70 mg by mouth once a week.     Marland Kitchen amLODipine (NORVASC) 10 MG tablet TAKE 1 TABLET BY MOUTH EVERY DAY 90 tablet 0  . atorvastatin (LIPITOR) 80 MG tablet TAKE 1 TABLET BY MOUTH EVERY DAY 90 tablet 0  . Calcium Carb-Cholecalciferol (CALCIUM 500+D) 500-200 MG-UNIT TABS Take 1 tablet by mouth daily. (Patient not taking: Reported on 01/24/2018) 90 tablet 0  . chlorthalidone (HYGROTON) 25 MG tablet Take 25 mg by mouth daily.    . fluticasone (FLONASE) 50 MCG/ACT nasal spray SPRAY 1 SPRAY IN EACH NOSTRIL 2 TIMES A DAY 1 g 3  . furosemide (LASIX) 40 MG tablet Take 1 tablet (40 mg total) by mouth as needed. 30 tablet 3  . Gauze Pads & Dressings (ABDOMINAL PAD) 1"O10" PADS 1 application by Does not apply route 4 (four) times daily. 360 each 5  . Incontinence Supply Disposable (ADHERES INCONTINENCE PAD) MISC 1 application by Does not apply route 4 (four) times daily. 100 each 6  . levothyroxine (SYNTHROID, LEVOTHROID) 88 MCG tablet Take 1 tablet (88 mcg total) by mouth daily. Get labs on 11/25/2017 for further  refills. 60 tablet 0  . lisinopril (PRINIVIL,ZESTRIL) 20 MG tablet TAKE 1 TABLET (20 MG TOTAL) BY MOUTH DAILY. 90 tablet 0  . meloxicam (MOBIC) 15 MG tablet Take 1 tablet (15 mg total) by mouth daily. (Patient not taking: Reported on 01/24/2018) 30 tablet 3  . nystatin (MYCOSTATIN/NYSTOP) powder Apply topically 2 (two) times daily. 45 g 0  . olopatadine (PATANOL) 0.1 % ophthalmic solution Place 1 drop into both eyes 2 (two) times daily. (Patient not taking: Reported on 01/24/2018) 5 mL 3  . ranitidine (ZANTAC) 300 MG tablet TAKE 1 TABLET BY MOUTH EVERYDAY AT BEDTIME (Patient not taking: Reported on 01/24/2018) 90 tablet 0  . sotalol (BETAPACE) 80 MG tablet Take 1 tablet (80 mg total) by mouth 2 (two) times daily. 60 tablet 2  . terbinafine (LAMISIL AT) 1 %  cream Apply 1 application topically 2 (two) times daily. 30 g 0  . traMADol (ULTRAM) 50 MG tablet Take 1 tablet (50 mg total) by mouth every 12 (twelve) hours as needed for moderate pain. (Patient not taking: Reported on 01/24/2018) 10 tablet 0  . Vitamin D, Ergocalciferol, (DRISDOL) 50000 units CAPS capsule Take by mouth.    . warfarin (COUMADIN) 5 MG tablet Take as directed by the coumadin clinic. 60 tablet 0   No current facility-administered medications on file prior to visit.      Family Hx: The patient's family history includes Cirrhosis in her mother; Heart attack in her father; Heart failure in her mother; Hyperlipidemia in her father; Hypertension in her father; Stroke in her father.  ROS:   Please see the history of present illness.    Review of Systems  Constitutional: Negative.   Respiratory: Negative.   Cardiovascular: Negative.   Gastrointestinal: Negative.   Musculoskeletal: Negative.   Neurological: Negative.   Psychiatric/Behavioral: Negative.   All other systems reviewed and are negative.    Labs/Other Tests and Data Reviewed:    Recent Labs: 10/11/2017: ALT 14; BUN 19; Creat 1.02; Potassium 4.4; Sodium 140; TSH 8.36    Recent Lipid Panel Lab Results  Component Value Date/Time   CHOL 211 (H) 10/11/2017 12:25 PM   CHOL 228 (H) 04/29/2015 12:10 PM   TRIG 100 10/11/2017 12:25 PM   HDL 49 (L) 10/11/2017 12:25 PM   HDL 46 04/29/2015 12:10 PM   CHOLHDL 4.3 10/11/2017 12:25 PM   LDLCALC 141 (H) 10/11/2017 12:25 PM    Wt Readings from Last 3 Encounters:  01/24/18 147 lb 9.6 oz (67 kg)  10/11/17 151 lb 12.8 oz (68.9 kg)  06/14/17 149 lb 8 oz (67.8 kg)     Exam:    Vital Signs: Vital signs may also be detailed in the HPI There were no vitals taken for this visit.  Wt Readings from Last 3 Encounters:  01/24/18 147 lb 9.6 oz (67 kg)  10/11/17 151 lb 12.8 oz (68.9 kg)  06/14/17 149 lb 8 oz (67.8 kg)   Temp Readings from Last 3 Encounters:  01/24/18 (!) 97.4 F (36.3 C) (Oral)  10/11/17 98.4 F (36.9 C) (Oral)  05/03/17 (!) 97.4 F (36.3 C) (Oral)   BP Readings from Last 3 Encounters:  01/24/18 124/68  10/11/17 134/86  06/14/17 (!) 143/91   Pulse Readings from Last 3 Encounters:  01/24/18 70  10/11/17 75  06/14/17 71    Blood pressure 120/60, pulse 70, weight stable 150 pounds  Well nourished, well developed female in no acute distress. Constitutional:  oriented to person, place, and time. No distress.     ASSESSMENT & PLAN:    Sinus node dysfunction ([place]) Has pacemaker, followed by EP We will help facilitate appointment with Dr. Caryl Comes Last appointment January 2019  Atrial fibrillation, unspecified type [facility]) Denies any tachycardia or palpitations, no shortness of breath We will renew her medications Often going for months off her medications, restarted at the encouragement of her daughter  Cardiac pacemaker in situ As above we will set up follow-up with Dr. Caryl Comes  Atrial myxoma Prior surgery 2012 In follow-up may need to repeat echocardiogram  Essential hypertension, benign Blood pressure is well controlled on today's visit. No changes made to the medications.   Coronary artery disease of native artery of native heart with stable angina pectoris ([place]) Currently with no symptoms of angina. No further workup at this time. Continue current  medication regimen. Stressed importance of staying on her cholesterol medication  Essential hypertension No medication changes made She will continue to take Lasix as needed  Hx of CABG Prior surgical details discussed with her Myxoma resection, bypass graft    COVID-19 Education: The signs and symptoms of COVID-19 were discussed with the patient and how to seek care for testing (follow up with PCP or arrange E-visit).  The importance of social distancing was discussed today.  Patient Risk:   After full review of this patients clinical status, I feel that they are at least moderate risk at this time.  Time:   Today, I have spent 45 minutes with the patient with telehealth technology discussing the cardiac and medical problems/diagnoses detailed above     Medication Adjustments/Labs and Tests Ordered: Current medicines are reviewed at length with the patient today.  Concerns regarding medicines are outlined above.   Tests Ordered: No tests ordered   Medication Changes: No changes made   Disposition: Follow-up in 6 months Alternate with Dr. Caryl Comes every  6 months   Signed, Ida Rogue, MD  09/18/2018 3:20 PM    [place] Office [address] [place] [address], [place], [place] [address]

## 2018-09-19 ENCOUNTER — Other Ambulatory Visit: Payer: Self-pay

## 2018-09-19 ENCOUNTER — Telehealth (INDEPENDENT_AMBULATORY_CARE_PROVIDER_SITE_OTHER): Payer: Medicare Other | Admitting: Cardiovascular Disease

## 2018-09-19 DIAGNOSIS — I10 Essential (primary) hypertension: Secondary | ICD-10-CM | POA: Diagnosis not present

## 2018-09-19 DIAGNOSIS — I4891 Unspecified atrial fibrillation: Secondary | ICD-10-CM

## 2018-09-19 DIAGNOSIS — D151 Benign neoplasm of heart: Secondary | ICD-10-CM | POA: Diagnosis not present

## 2018-09-19 DIAGNOSIS — E7800 Pure hypercholesterolemia, unspecified: Secondary | ICD-10-CM

## 2018-09-19 DIAGNOSIS — I495 Sick sinus syndrome: Secondary | ICD-10-CM | POA: Diagnosis not present

## 2018-09-19 DIAGNOSIS — Z950 Presence of cardiac pacemaker: Secondary | ICD-10-CM

## 2018-09-19 DIAGNOSIS — Z951 Presence of aortocoronary bypass graft: Secondary | ICD-10-CM | POA: Diagnosis not present

## 2018-09-19 DIAGNOSIS — I25118 Atherosclerotic heart disease of native coronary artery with other forms of angina pectoris: Secondary | ICD-10-CM | POA: Diagnosis not present

## 2018-09-19 DIAGNOSIS — I159 Secondary hypertension, unspecified: Secondary | ICD-10-CM

## 2018-09-19 MED ORDER — CHLORTHALIDONE 25 MG PO TABS: 25.0000 mg | ORAL_TABLET | Freq: Every day | ORAL | 3 refills | Status: DC

## 2018-09-19 MED ORDER — LISINOPRIL 20 MG PO TABS: ORAL_TABLET | ORAL | 3 refills | Status: DC

## 2018-09-19 MED ORDER — AMLODIPINE BESYLATE 10 MG PO TABS: 10.0000 mg | ORAL_TABLET | Freq: Every day | ORAL | 3 refills | Status: DC

## 2018-09-19 MED ORDER — SOTALOL HCL 80 MG PO TABS: 80.0000 mg | ORAL_TABLET | Freq: Two times a day (BID) | ORAL | 3 refills | Status: DC

## 2018-09-19 MED ORDER — FUROSEMIDE 40 MG PO TABS: 40.0000 mg | ORAL_TABLET | ORAL | 3 refills | Status: DC | PRN

## 2018-09-19 MED ORDER — CETIRIZINE HCL 10 MG PO TABS: 10.0000 mg | ORAL_TABLET | Freq: Every day | ORAL | 1 refills | Status: DC

## 2018-09-19 MED ORDER — ATORVASTATIN CALCIUM 80 MG PO TABS: 80.0000 mg | ORAL_TABLET | Freq: Every day | ORAL | 3 refills | Status: DC

## 2018-09-19 NOTE — Patient Instructions (Addendum)
Medication Instructions:  Your physician has recommended you make the following change in your medication:  1. TRY Cetirizine 10 mg once a day for allergies.  Refills sent in for you and will send message over to Dr. Ancil Boozer.   Would check with primary care to see if they would like [place] to refill thyroid medication until she is able to get into see them I can refill if they would like until she is able to get into see them  -Looks like she is coming due for annual blood work with primary care  If you need a refill on your cardiac medications before your next appointment, please call your pharmacy.    Lab work: No new labs needed   If you have labs (blood work) drawn today and your tests are completely normal, you will receive your results only by: Marland Kitchen MyChart Message (if you have MyChart) OR . A paper copy in the mail If you have any lab test that is abnormal or we need to change your treatment, we will call you to review the results.   Testing/Procedures: No new testing needed   Follow-Up: At [facility], you and your health needs are our priority.  As part of our continuing mission to provide you with exceptional heart care, we have created designated Provider Care Teams.  These Care Teams include your primary Cardiologist (physician) and Advanced Practice Providers (APPs -  Physician Assistants and Nurse Practitioners) who all work together to provide you with the care you need, when you need it.  . You will need a follow up appointment in 6 months,  . needs follow up with Dr. Caryl Comes .   Please call our office 2 months in advance to schedule this appointment.    . Providers on your designated Care Team:   . Murray Hodgkins, NP . Christell Faith, PA-C . Marrianne Mood, PA-C  Any Other Special Instructions Will Be Listed Below (If Applicable).  For educational health videos Log in to : [email or web address] Or : [email or web address], password : [email or web address]

## 2018-10-02 ENCOUNTER — Telehealth: Payer: Self-pay

## 2018-10-02 NOTE — Telephone Encounter (Signed)
Attempted to contact pt to prescreen for COVID19 prior to INR check on 10/04/18. Left detailed message on Linda's vm advising her that I will be in front of the [place] entrance of [facility] and for her to pull up to [place] parking.  Asked her to call back if she currently has a fever, has travelled recently, or has been in contact w/ anyone w/ COVID19.

## 2018-10-03 ENCOUNTER — Other Ambulatory Visit: Payer: Self-pay | Admitting: Cardiovascular Disease

## 2018-10-04 ENCOUNTER — Other Ambulatory Visit: Payer: Self-pay

## 2018-10-04 ENCOUNTER — Ambulatory Visit (INDEPENDENT_AMBULATORY_CARE_PROVIDER_SITE_OTHER): Payer: Medicare Other

## 2018-10-04 DIAGNOSIS — Z5181 Encounter for therapeutic drug level monitoring: Secondary | ICD-10-CM | POA: Diagnosis not present

## 2018-10-04 DIAGNOSIS — I4891 Unspecified atrial fibrillation: Secondary | ICD-10-CM | POA: Diagnosis not present

## 2018-10-04 LAB — POCT INR: INR: 2.1 (ref 2.0–3.0)

## 2018-10-04 NOTE — Patient Instructions (Signed)
Please continue dosage of 1/2 tablet every day. Recheck in 6 weeks.

## 2018-10-17 ENCOUNTER — Telehealth (INDEPENDENT_AMBULATORY_CARE_PROVIDER_SITE_OTHER): Payer: Medicare Other | Admitting: Internal Medicine

## 2018-10-17 ENCOUNTER — Other Ambulatory Visit: Payer: Self-pay

## 2018-10-17 VITALS — Ht 60.0 in | Wt 153.0 lb

## 2018-10-17 DIAGNOSIS — Z950 Presence of cardiac pacemaker: Secondary | ICD-10-CM

## 2018-10-17 DIAGNOSIS — I4891 Unspecified atrial fibrillation: Secondary | ICD-10-CM

## 2018-10-17 DIAGNOSIS — Z79899 Other long term (current) drug therapy: Secondary | ICD-10-CM

## 2018-10-17 DIAGNOSIS — E039 Hypothyroidism, unspecified: Secondary | ICD-10-CM

## 2018-10-17 DIAGNOSIS — Z951 Presence of aortocoronary bypass graft: Secondary | ICD-10-CM

## 2018-10-17 NOTE — Progress Notes (Signed)
Electrophysiology TeleHealth Note   Due to national recommendations of social distancing due to COVID 19, an audio/video telehealth visit is felt to be most appropriate for this patient at this time.  See MyChart message from today for the patient's consent to telehealth for [name].   Date:  10/17/2018   ID:  [name], DOB February 21, 1946, MRN [id number]  Location: patient's home  Provider location: [address], [place]  Evaluation Performed: Follow-up visit  PCP:  Felicia Sizer, MD  Cardiologist:     Electrophysiologist:  SK   Chief Complaint: afib and heart failure   History of Present Illness:    Felicia Poole is a 73 y.o. female who presents via Engineer, civil (consulting) for a telehealth visit today. The patient did not have access to video technology/had technical difficulties with video requiring transitioning to audio format only (telephone).  All issues noted in this document were discussed and addressed.  No physical exam could be performed with this format.     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    Past Medical History:  Diagnosis Date  . Atrial myxoma    Status post resection in 2012 at [facility]  . Benign neoplasm of heart   . Contact dermatitis and other eczema, due to unspecified cause   . Contusion of unspecified site   . Coronary atherosclerosis of unspecified type of vessel, native or graft    Status post CABG in March of 2012 at  the time of atrial myxoma resection with LIMA to LAD done at [facility]  . Cramp of limb   . Essential hypertension, benign   . Heartburn   . Lumbago   . Nonischemic cardiomyopathy ([place])    EF: 45% in 2012  . Nonischemic cardiomyopathy ([place])   . Osteoporosis, unspecified   . Other and unspecified hyperlipidemia   . Other specified cardiac dysrhythmias(427.89)   . Postsurgical percutaneous transluminal coronary angioplasty status   . Type II or unspecified type diabetes mellitus without mention of complication, not stated as uncontrolled   . Unspecified hypothyroidism   . Unspecified menopausal and postmenopausal disorder     Past Surgical History:  Procedure Laterality Date  . ATRIAL MYXOMA EXCISION Left   . CARDIAC CATHETERIZATION  2012   Unc; right and left heart 75% ostial LAD lesion  . CORONARY ARTERY BYPASS GRAFT  2011   [facility]  . INSERT / REPLACE / REMOVE PACEMAKER  02/2013   Dual chamber MDT advisa pacemaker. [id number]  . VAGINAL HYSTERECTOMY      Current Outpatient Medications  Medication Sig Dispense Refill  . amLODipine (NORVASC) 10 MG tablet Take 1 tablet (10 mg total) by mouth daily. 90 tablet 3  . atorvastatin (LIPITOR) 80 MG tablet Take 1 tablet (80 mg total) by mouth daily. 90 tablet 3  . Calcium Carb-Cholecalciferol (CALCIUM 500+D) 500-200 MG-UNIT TABS  Take 1 tablet by mouth daily. 90 tablet 0  . chlorthalidone (HYGROTON) 25 MG tablet Take 1 tablet (25 mg total) by mouth daily. 90 tablet 3  . fluticasone (FLONASE) 50 MCG/ACT nasal spray SPRAY 1 SPRAY IN EACH NOSTRIL 2 TIMES A DAY 1 g 3  . furosemide (LASIX) 40 MG tablet Take 1 tablet (40 mg total) by mouth as needed. 30 tablet 3  . Gauze Pads & Dressings (ABDOMINAL PAD) 6"E95" PADS 1 application by Does not apply route 4 (four) times daily. 360 each 5  . Incontinence Supply Disposable (ADHERES INCONTINENCE PAD) MISC 1 application by Does not apply route 4 (four) times daily. 100 each 6  . levothyroxine (SYNTHROID,  LEVOTHROID) 88 MCG tablet Take 1 tablet (88 mcg total) by mouth daily. Get labs on 11/25/2017 for further refills. 60 tablet 0  . nystatin (MYCOSTATIN/NYSTOP) powder Apply topically 2 (two) times daily. 45 g 0  . ranitidine (ZANTAC) 300 MG tablet TAKE 1 TABLET BY MOUTH EVERYDAY AT BEDTIME 90 tablet 0  . sotalol (BETAPACE) 80 MG tablet Take 1 tablet (80 mg total) by mouth 2 (two) times daily. 180 tablet 3  . Vitamin D, Ergocalciferol, (DRISDOL) 50000 units CAPS capsule Take by mouth.    . warfarin (COUMADIN) 5 MG tablet Take as directed by the coumadin clinic. 60 tablet 0  . alendronate (FOSAMAX) 70 MG tablet Take 70 mg by mouth once a week.     Marland Kitchen lisinopril (PRINIVIL,ZESTRIL) 20 MG tablet TAKE 1 TABLET (20 MG TOTAL) BY MOUTH DAILY. (Patient not taking: Reported on 10/17/2018) 90 tablet 3  . meloxicam (MOBIC) 15 MG tablet Take 1 tablet (15 mg total) by mouth daily. (Patient not taking: Reported on 10/17/2018) 30 tablet 3  . olopatadine (PATANOL) 0.1 % ophthalmic solution Place 1 drop into both eyes 2 (two) times daily. (Patient not taking: Reported on 10/17/2018) 5 mL 3   No current facility-administered medications for this visit.     Allergies:   Patient has no known allergies.   Social History:  The patient  reports that she has never smoked. She has never used smokeless tobacco. She reports that she does not drink alcohol or use drugs.   Family History:  The patient's   family history includes Cirrhosis in her mother; Heart attack in her father; Heart failure in her mother; Hyperlipidemia in her father; Hypertension in her father; Stroke in her father.   ROS:  Please see the history of present illness.   All other systems are personally reviewed and negative.    Exam:    Vital Signs:  Ht 5' (1.524 m)   Wt 153 lb (69.4 kg)   BMI 29.88 kg/m     Labs/Other Tests and Data Reviewed:    Recent Labs: No results found for requested labs within last 8760 hours.   Wt Readings from Last 3  Encounters:  10/17/18 153 lb (69.4 kg)  01/24/18 147 lb 9.6 oz (67 kg)  10/11/17 151 lb 12.8 oz (68.9 kg)     Other studies personally reviewed: Additional studies/ records that were reviewed today include:     Last device remote is reviewed from [place] PDF dated 2017  *longevity est then 4.5-7 yrs    ASSESSMENT & PLAN:    Paroxysmal atrial fibrillation with posttermination pausing  Sinus node dysfunction  100% A pacing  High risk medication surveillance  Hypothyroidism --- treated  CAD with 1 V CABG  HFpEF  Pacemaker St Jude   Atrial  myxoma history of resection  Will need in office interrogation to assess battery and atrial fib burden  Need to check blood work for sotalol  Also Rx hypothyroidism with elev TSH at last blood draw  Euvolemic by her report continue current meds  Without symptoms of ischemia   COVID 19 screen The patient denies symptoms of COVID 19 at this time.  The importance of social distancing was discussed today.  Follow-up 23m w pacemaker check   Next remote:na  Current medicines are reviewed at length with the patient today.   The patient does not have concerns regarding her medicines.  The following changes were made today:  none  Labs/ tests ordered today include: BMET, TSH, CBC No orders of the defined types were placed in this encounter.   Future tests ( post COVID )   s  Patient Risk:  after full review of this patients clinical status, I feel that they are at moderate risk at this time.  Today, I have spent 6 minutes with the patient with telehealth technology discussing the above.  Signed, Virl Axe, MD  10/17/2018 11:13 AM     [facility] [address] [place] [address] [phone] (office) [phone] (fax)

## 2018-10-18 ENCOUNTER — Telehealth: Payer: Self-pay | Admitting: Internal Medicine

## 2018-10-18 NOTE — Addendum Note (Signed)
Addended by: Alvis Lemmings C on: 10/18/2018 09:42 AM   Modules accepted: Orders

## 2018-10-18 NOTE — Telephone Encounter (Signed)
I left a message for the patient, or Felicia Poole to please call back in regards to the patient's e-visit from yesterday with Dr. Caryl Comes.  I advised we need to have her go to the [facility] for lab work (orders placed). She will need a 2 month f/u with Dr. Caryl Comes.

## 2018-10-18 NOTE — Patient Instructions (Signed)
Medication Instructions:  - Your physician recommends that you continue on your current medications as directed. Please refer to the Current Medication list given to you today.  If you need a refill on your cardiac medications before your next appointment, please call your pharmacy.   Lab work: - Your physician recommends that you have lab work: BMP/ TSH/ CBC- please go to the [facility] at [facility] at your convenience to have this done- 1st desk on the right to check in. They are open Monday-Friday (7:30 am- 5:00 pm)  If you have labs (blood work) drawn today and your tests are completely normal, you will receive your results only by: Marland Kitchen MyChart Message (if you have MyChart) OR . A paper copy in the mail If you have any lab test that is abnormal or we need to change your treatment, we will call you to review the results.  Testing/Procedures: - none ordered  Follow-Up: At [facility], you and your health needs are our priority.  As part of our continuing mission to provide you with exceptional heart care, we have created designated Provider Care Teams.  These Care Teams include your primary Cardiologist (physician) and Advanced Practice Providers (APPs -  Physician Assistants and Nurse Practitioners) who all work together to provide you with the care you need, when you need it.  . in 2 months with Dr. Caryl Comes (device interrogation/ check battery status)   Any Other Special Instructions Will Be Listed Below (If Applicable). - N/A

## 2018-10-19 IMAGING — CR DG FOOT 2V*L*
1 series · 2 of 2 positions shown · non-contrast
Comparison: None.

CLINICAL DATA: Bilateral foot swelling

EXAM:
LEFT FOOT - 2 VIEW

[Series 1: dg foot 2 views left · 0.14mm/px · 2 of 2 slices shown]
[im 1/2]
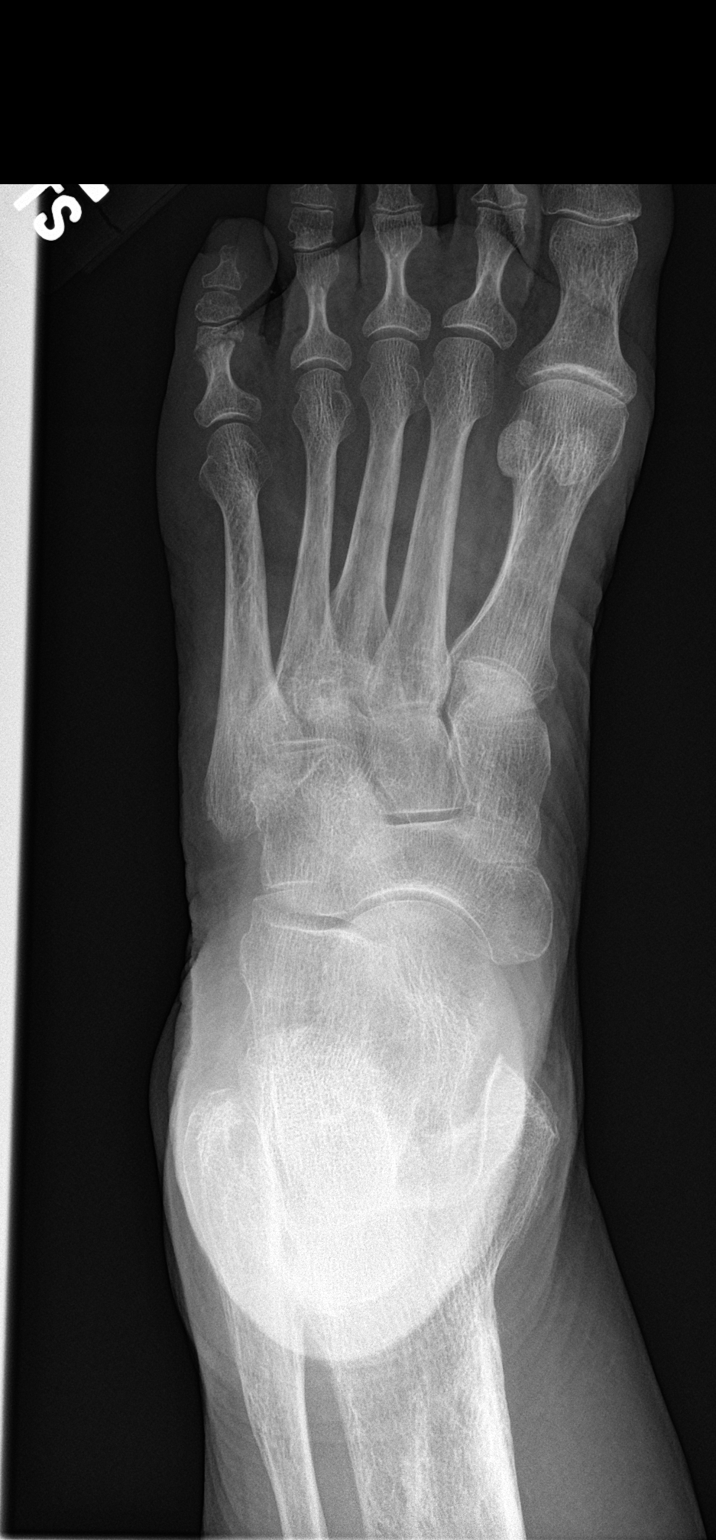
[im 2/2]
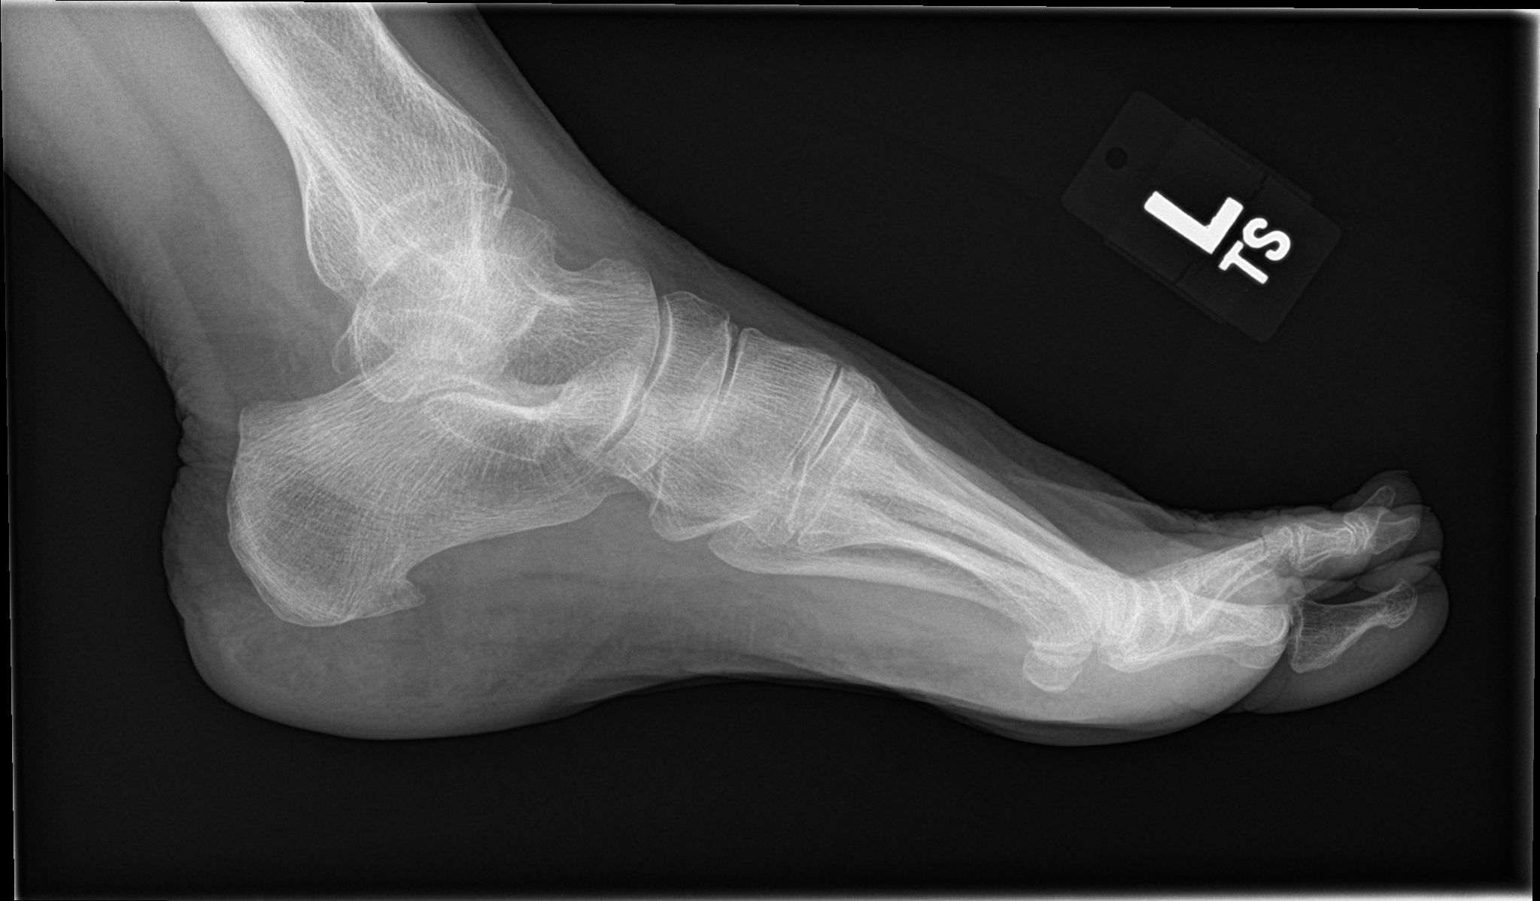

[2 of 2 positions shown; findings below may reference images not displayed]

FINDINGS: Fracture of the fifth proximal phalanx. No significant bony healing
or periosteal reaction. No other fracture or arthropathy.
IMPRESSION: Recent fracture of the fifth proximal phalanx.

## 2018-10-19 IMAGING — CR DG FOOT 2V*R*
1 series · 2 of 2 positions shown · non-contrast
Comparison: None.

CLINICAL DATA: Bilateral foot swelling

EXAM:
RIGHT FOOT - 2 VIEW

[Series 1: dg foot 2 views right · 0.14mm/px · 2 of 2 slices shown]
[im 1/2]
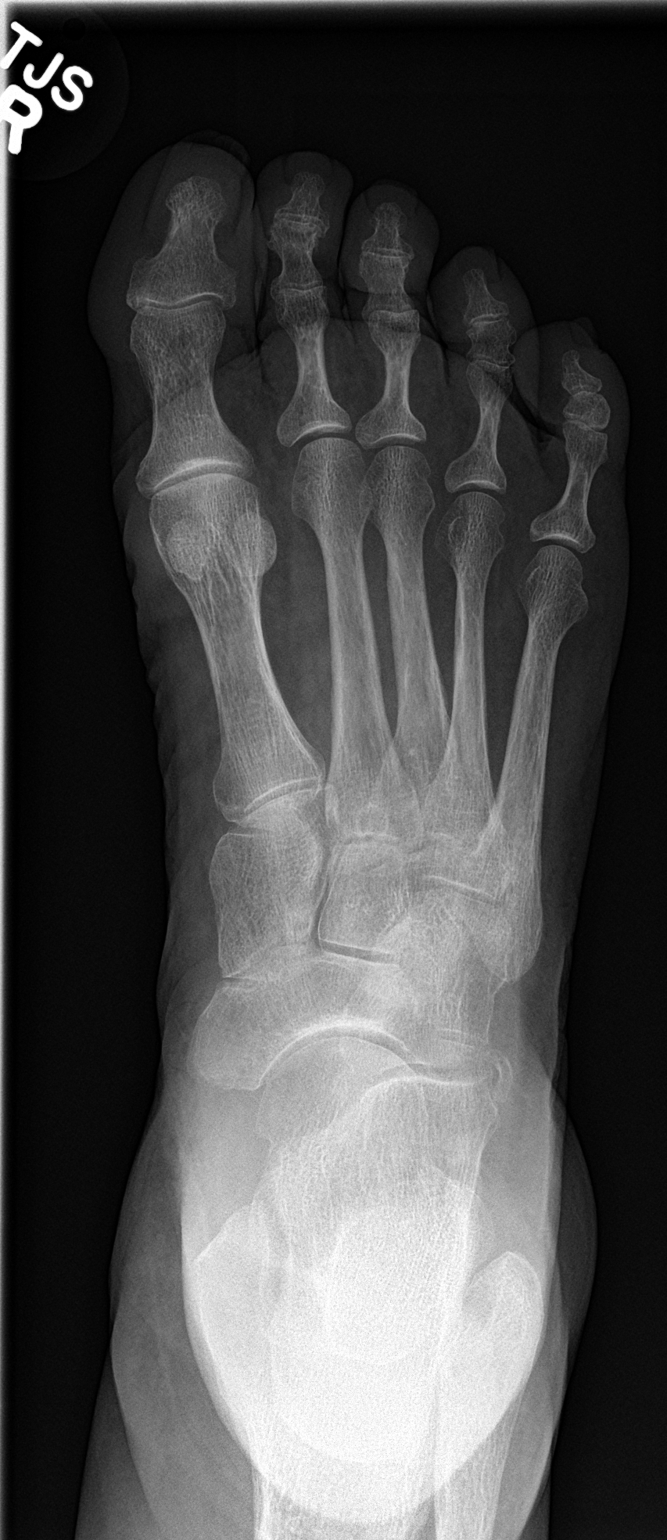
[im 2/2]
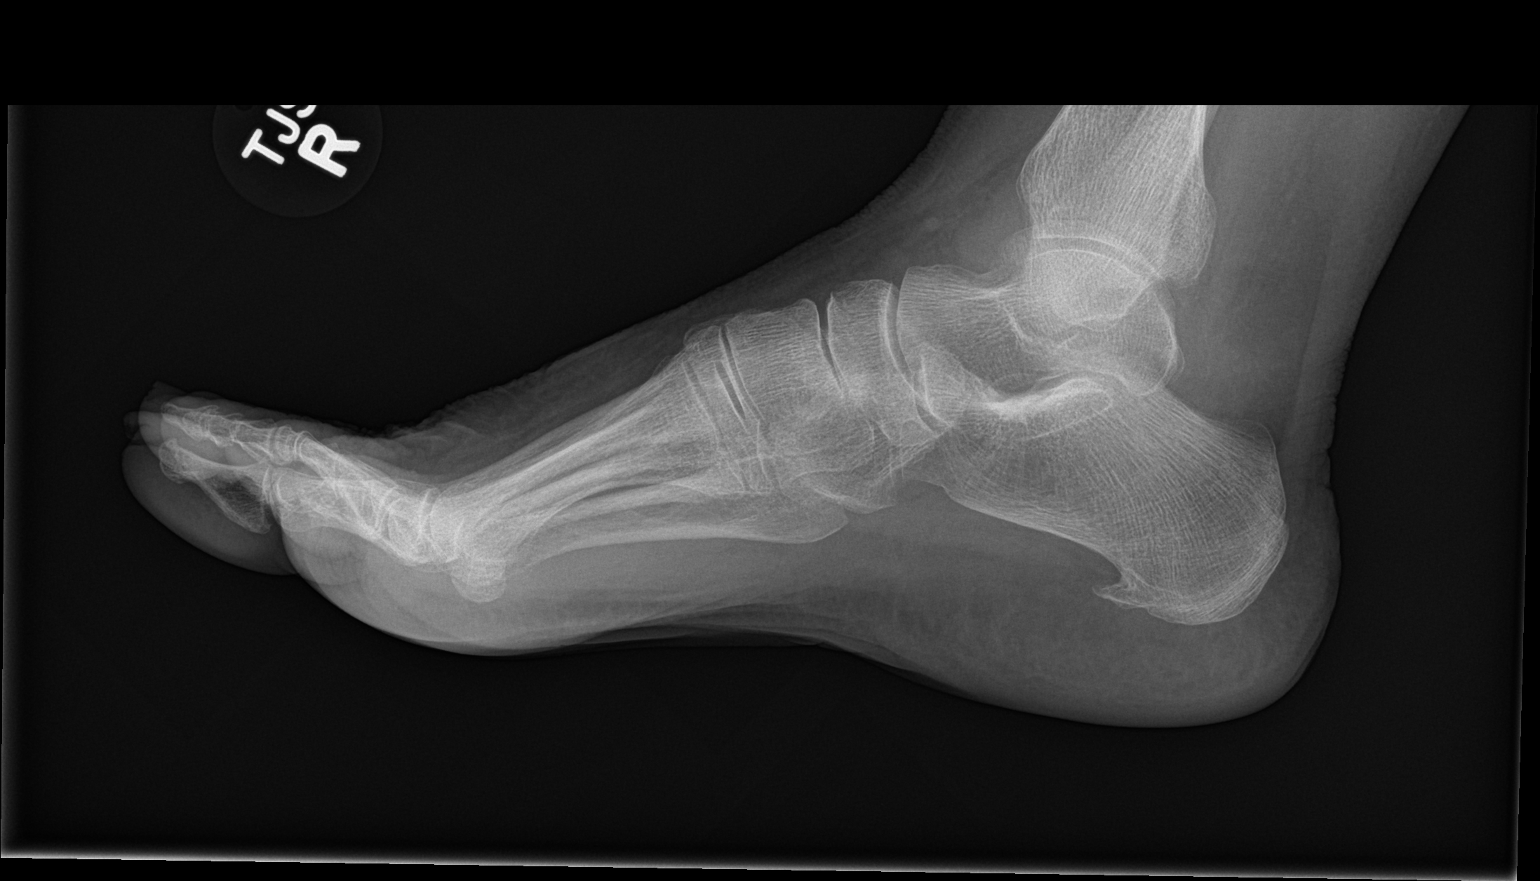

[2 of 2 positions shown; findings below may reference images not displayed]

FINDINGS: There is no evidence of fracture or dislocation. There is no
evidence of arthropathy or other focal bone abnormality. Soft
tissues are unremarkable. Calcaneal spurring on the plantar surface.
IMPRESSION: Negative.

## 2018-10-20 NOTE — Telephone Encounter (Signed)
Attempted to call the patient. No answer- I left a message for the patient/ Felicia Poole (daughter) to call back.

## 2018-10-20 NOTE — Telephone Encounter (Signed)
I spoke with [name] (ok per [facility]). She is aware that the patient will need to have labs done at her convenience at the [place] entrance at [facility].  BMP/ CBC/ TSH orders are in from her e-visit on 5/12 with Dr. Caryl Comes.  [name] is also aware that the patient will need to follow up in  2 months. I have asked that they call back in early to mid June to schedule.   [name] voices understanding and is agreeable.

## 2018-10-27 ENCOUNTER — Other Ambulatory Visit
Admission: RE | Admit: 2018-10-27 | Discharge: 2018-10-27 | Disposition: A | Discharge status: Home / Self Care | Payer: Medicare Other | Source: Ambulatory Visit | Attending: Internal Medicine | Admitting: Internal Medicine

## 2018-10-27 DIAGNOSIS — Z79899 Other long term (current) drug therapy: Secondary | ICD-10-CM | POA: Insufficient documentation

## 2018-10-27 DIAGNOSIS — E039 Hypothyroidism, unspecified: Secondary | ICD-10-CM | POA: Insufficient documentation

## 2018-10-27 DIAGNOSIS — I4891 Unspecified atrial fibrillation: Secondary | ICD-10-CM | POA: Insufficient documentation

## 2018-10-27 LAB — BASIC METABOLIC PANEL
Anion gap: 6 (ref 5–15)
BUN: 30 mg/dL — ABNORMAL HIGH (ref 8–23)
CO2: 27 mmol/L (ref 22–32)
Calcium: 8.9 mg/dL (ref 8.9–10.3)
Chloride: 109 mmol/L (ref 98–111)
Creatinine, Ser: 1 mg/dL (ref 0.44–1.00)
GFR calc Af Amer: >60 mL/min (ref >60)
GFR calc non Af Amer: 56 mL/min — ABNORMAL LOW (ref >60)
Glucose, Bld: 102 mg/dL — ABNORMAL HIGH (ref 70–99)
Potassium: 4.5 mmol/L (ref 3.5–5.1)
Sodium: 142 mmol/L (ref 135–145)

## 2018-10-27 LAB — CBC WITH DIFFERENTIAL/PLATELET
Abs Immature Granulocytes: 0.01 10*3/uL (ref 0.00–0.07)
Basophils Absolute: 0 10*3/uL (ref 0.0–0.1)
Basophils Relative: 1 %
Eosinophils Absolute: 0.1 10*3/uL (ref 0.0–0.5)
Eosinophils Relative: 2 %
HCT: 40.3 % (ref 36.0–46.0)
Hemoglobin: 13 g/dL (ref 12.0–15.0)
Immature Granulocytes: 0 %
Lymphocytes Relative: 30 %
Lymphs Abs: 1.3 10*3/uL (ref 0.7–4.0)
MCH: 31.3 pg (ref 26.0–34.0)
MCHC: 32.3 g/dL (ref 30.0–36.0)
MCV: 96.9 fL (ref 80.0–100.0)
Monocytes Absolute: 0.3 10*3/uL (ref 0.1–1.0)
Monocytes Relative: 7 %
Neutro Abs: 2.6 10*3/uL (ref 1.7–7.7)
Neutrophils Relative %: 60 %
Platelets: 150 10*3/uL (ref 150–400)
RBC: 4.16 MIL/uL (ref 3.87–5.11)
RDW: 13 % (ref 11.5–15.5)
WBC: 4.3 10*3/uL (ref 4.0–10.5)
nRBC: 0 % (ref 0.0–0.2)

## 2018-10-27 LAB — TSH: TSH: 0.952 u[IU]/mL (ref 0.350–4.500)

## 2018-11-03 ENCOUNTER — Other Ambulatory Visit: Payer: Self-pay | Admitting: Nurse Practitioner

## 2018-11-13 ENCOUNTER — Telehealth: Payer: Self-pay

## 2018-11-13 NOTE — Telephone Encounter (Signed)
Attempted to contact pt to prescreen for COVID19. Left detailed message on Linda's vm that due to COVID restrictions, I am seeing fewer pts in the office per day and need to move her appt, to the afternoon on Wed if possible.  Asked her to call back to reschedule.

## 2018-11-20 ENCOUNTER — Telehealth: Payer: Self-pay

## 2018-11-20 NOTE — Telephone Encounter (Signed)
Attempted again to contact pt.  Received message "the number you are trying to call is not in service".

## 2018-11-20 NOTE — Telephone Encounter (Signed)
Attempted to contact pt to prescreen for COVID19 prior to INR check on Wed, 6/17. "Call cannot be completed at this time". Will attempt again later.

## 2018-11-22 ENCOUNTER — Ambulatory Visit (INDEPENDENT_AMBULATORY_CARE_PROVIDER_SITE_OTHER): Payer: Medicare Other

## 2018-11-22 ENCOUNTER — Other Ambulatory Visit: Payer: Self-pay

## 2018-11-22 DIAGNOSIS — Z5181 Encounter for therapeutic drug level monitoring: Secondary | ICD-10-CM | POA: Diagnosis not present

## 2018-11-22 DIAGNOSIS — I4891 Unspecified atrial fibrillation: Secondary | ICD-10-CM

## 2018-11-22 LAB — POCT INR: INR: 2.9 (ref 2.0–3.0)

## 2018-11-22 NOTE — Patient Instructions (Signed)
Please continue dosage of 1/2 tablet every day. Recheck in 6 weeks

## 2018-12-02 ENCOUNTER — Other Ambulatory Visit: Payer: Self-pay | Admitting: Nurse Practitioner

## 2018-12-02 DIAGNOSIS — E039 Hypothyroidism, unspecified: Secondary | ICD-10-CM

## 2018-12-18 ENCOUNTER — Telehealth: Payer: Self-pay | Admitting: Internal Medicine

## 2018-12-18 NOTE — Telephone Encounter (Signed)
Attempted to call for COVID screening questions for the patient's appt tomorrow with Dr. Caryl Comes at 10:30 am.   No answer- I left a message to please call back.

## 2018-12-19 ENCOUNTER — Encounter: Payer: Medicare Other | Admitting: Internal Medicine

## 2018-12-19 NOTE — Telephone Encounter (Signed)
Patient r/s to 7/21 with Dr. Caryl Comes due to a scheduling conflict with today's appointment.

## 2018-12-22 ENCOUNTER — Telehealth: Payer: Self-pay

## 2018-12-22 NOTE — Telephone Encounter (Signed)
LMVM need to reschedule appointment.

## 2018-12-26 ENCOUNTER — Encounter: Payer: Medicare Other | Admitting: Internal Medicine

## 2019-01-03 ENCOUNTER — Other Ambulatory Visit: Payer: Self-pay

## 2019-01-03 ENCOUNTER — Ambulatory Visit (INDEPENDENT_AMBULATORY_CARE_PROVIDER_SITE_OTHER): Payer: Medicare Other

## 2019-01-03 DIAGNOSIS — Z5181 Encounter for therapeutic drug level monitoring: Secondary | ICD-10-CM

## 2019-01-03 DIAGNOSIS — I4891 Unspecified atrial fibrillation: Secondary | ICD-10-CM

## 2019-01-03 LAB — POCT INR: INR: 2.2 (ref 2.0–3.0)

## 2019-01-03 NOTE — Patient Instructions (Signed)
Please continue dosage of 1/2 tablet every day. Recheck in 6 weeks.

## 2019-01-22 ENCOUNTER — Ambulatory Visit: Payer: Medicare Other | Admitting: Family Medicine

## 2019-01-22 ENCOUNTER — Ambulatory Visit: Payer: Self-pay | Admitting: Family Medicine

## 2019-01-22 ENCOUNTER — Telehealth: Payer: Self-pay | Admitting: Family Medicine

## 2019-01-22 NOTE — Telephone Encounter (Signed)
daughter [name] called - she says she needs to have refills on pt's depends.  Please send order to [facility], [address]

## 2019-01-22 NOTE — Telephone Encounter (Signed)
  Daughter Felicia Poole called in.   She had multiple issues with her mother.    They had an appt to see Dr. Ancil Boozer today but cancelled it because they don't have any gas money to come in. The issues were: Needs more thyroid medication Her ankle is swollen on and off for a week however [name] unable to tell me much more about it.   "My mother is asleep right now".  She was unable to answer the triage questions pertaining to her mother's ankle. She needs something to help her mother sleep. She needs a good physical.  They have already rescheduled to be seen this Monday so I encouraged [name] to make a list of these issues so she could discuss these things with Dr. Ancil Boozer when they come in since she has several things to discuss.   She agreed to do that.  I forwarded these notes to the office so Dr. Ancil Boozer would know what is going on.    I asked her what the appt was for today and she said for the thyroid medicine she thought. Reason for Disposition . Swollen ankle joint  (Exception: area of localized swelling which is itchy)    No gas money to come in today so appt cancelled (01/22/2019)   She has been rescheduled for Monday.  Answer Assessment - Initial Assessment Questions 1. LOCATION: "Which joint is swollen?"     Daughter calling in for her.    Her ankle is swollen but I don't know which one.   Felicia Poole 2. ONSET: "When did the swelling start?"     It goes up and down for about a week. 3. SIZE: "How large is the swelling?"     It has gone down now. 4. PAIN: "Is there any pain?" If so, ask: "How bad is it?" (Scale 1-10; or mild, moderate, severe)     Hurts her when she walks. 5. CAUSE: "What do you think caused the swollen joint?"     I don't know 6. OTHER SYMPTOMS: "Do you have any other symptoms?" (e.g., fever, chest pain, difficulty breathing, calf pain)     She can't sleep.   She needs something to help her sleep. 7. PREGNANCY: "Is there any chance you are pregnant?" "When was your  last menstrual period?"     N/A due to age.  Protocols used: ANKLE SWELLING-A-AH

## 2019-01-23 ENCOUNTER — Encounter: Payer: Medicare Other | Admitting: Internal Medicine

## 2019-01-23 NOTE — Telephone Encounter (Signed)
Left Patient and her daughter Tilford Pillar a message stating unfortunately we will have to be seen first before able to fill her prescriptions.

## 2019-01-23 NOTE — Telephone Encounter (Signed)
Faxed prescription for incontinence pads to [facility] on 01/23/2019 at 3:32 p.m.

## 2019-01-25 ENCOUNTER — Other Ambulatory Visit: Payer: Self-pay | Admitting: Nurse Practitioner

## 2019-01-26 ENCOUNTER — Encounter: Payer: Self-pay | Admitting: Family Medicine

## 2019-01-26 ENCOUNTER — Ambulatory Visit (INDEPENDENT_AMBULATORY_CARE_PROVIDER_SITE_OTHER): Payer: Medicare Other | Admitting: Family Medicine

## 2019-01-26 ENCOUNTER — Ambulatory Visit: Payer: Medicare Other

## 2019-01-26 ENCOUNTER — Other Ambulatory Visit: Payer: Self-pay

## 2019-01-26 VITALS — BP 140/88 | HR 70 | Temp 97.5°F | Resp 16 | Ht 60.0 in | Wt 134.0 lb

## 2019-01-26 DIAGNOSIS — S82831A Other fracture of upper and lower end of right fibula, initial encounter for closed fracture: Secondary | ICD-10-CM | POA: Diagnosis not present

## 2019-01-26 DIAGNOSIS — E039 Hypothyroidism, unspecified: Secondary | ICD-10-CM | POA: Diagnosis not present

## 2019-01-26 DIAGNOSIS — S99911A Unspecified injury of right ankle, initial encounter: Secondary | ICD-10-CM

## 2019-01-26 DIAGNOSIS — M25471 Effusion, right ankle: Secondary | ICD-10-CM

## 2019-01-26 MED ORDER — LEVOTHYROXINE SODIUM 88 MCG PO TABS: 88.0000 ug | ORAL_TABLET | Freq: Every day | ORAL | 0 refills | Status: DC

## 2019-01-26 NOTE — Patient Instructions (Signed)
Elastic Bandage and RICE Therapy  Elastic bandages come in different shapes and sizes. They generally provide support to your injury and reduce swelling while you are healing, but they can perform different functions. Your health care provider will help you to decide what is best for your protection, recovery, or rehabilitation after an injury. The routine care of many injuries includes rest, ice, compression, and elevation (RICE therapy). RICE therapy is often recommended for injuries to soft tissues, such as muscle strain, sprains, bruises, and overuse injuries. It can also be used for some bone injuries. Using RICE therapy can help to relieve pain and lessen swelling. What are some general tips for using an elastic bandage?  Use the bandage as directed by the maker of the bandage that you are using.  Do not wrap the bandage too tightly. This may block (cut off) the circulation in the arm or leg in the area below the bandage. ? If part of your body beyond the bandage becomes blue, numb, cold, swollen, or more painful, your bandage is probably too tight. If this occurs, remove your bandage and reapply it more loosely.  Remove and reapply an elastic bandage every 3-4 hours or as told by your health care provider.  See your health care provider if the bandage seems to be making your problems worse rather than better. How to care for your injury with RICE therapy Rest Rest your injury. This may help with the healing process. Rest usually involves limiting your normal activities and not using the injured part of your body. Generally, you can return to your normal activities when your health care provider says it is okay and you can do them without much discomfort. If you rest the injury too much, it may not heal as well. Some injuries heal better with early movement instead of resting for too long. Talk with your health care provider about how you should limit your activities and whether you should  start range-of-motion exercises for your injury. Ice Ice your injury to lessen swelling and pain. Do not apply ice directly to your skin.  Put ice in a plastic bag.  Place a towel between your skin and the bag.  Leave the ice on for 20 minutes, 2-3 times a day. Use ice on as many days as told by your health care provider.  Compression Put pressure (compression) on your injured area to control swelling, give support, and help with discomfort. Compression may be done with an elastic bandage. Elevation Raise (elevate) your injured area to lessen swelling and pain. If possible, elevate your injured area at or above the level of your heart or the center of your chest. Contact a health care provider if:  Your pain and swelling continue.  Your symptoms are getting worse rather than improving. Having these problems may mean that you need further evaluation or imaging tests, such as X-rays or an MRI. Sometimes, X-rays may not show a small broken bone (fracture) until days after the injury happened. Make a follow-up appointment with your health care provider. Ask your health care provider, or the department that is doing the imaging test, when your results will be ready. Get help right away if:  You have sudden severe pain at or below the area of your injury.  You have redness or increased swelling around your injury.  You have tingling or numbness at or below the area of your injury and it does not improve after you remove the elastic bandage. Summary  Elastic  bandages provide support to your injury and reduce swelling while you are healing. Your health care provider will help you decide which type of elastic bandage is best for your injury.  Do not wrap the bandage too tightly. This may block (cut off) the circulation in the arm or leg in the area below the bandage.  Putting pressure (compression) on your injured area with an elastic bandage is part of RICE therapy. RICE therapy includes  rest, ice, compression, and elevation. This treatment is recommended for the routine care of many injuries. This information is not intended to replace advice given to you by your health care provider. Make sure you discuss any questions you have with your health care provider. Document Released: 11/13/2001 Document Revised: 02/11/2017 Document Reviewed: 02/11/2017 Elsevier Patient Education  [place].   Ankle Sprain  An ankle sprain is a stretch or tear in one of the tough tissues (ligaments) that connect the bones in your ankle. An ankle sprain can happen when the ankle rolls outward (inversion sprain) or inward (eversion sprain). What are the causes? This condition is caused by rolling or twisting the ankle. What increases the risk? You are more likely to develop this condition if you play sports. What are the signs or symptoms? Symptoms of this condition include:  Pain in your ankle.  Swelling.  Bruising. This may happen right after you sprain your ankle or 1-2 days later.  Trouble standing or walking. How is this diagnosed? This condition is diagnosed with:  A physical exam. During the exam, your doctor will press on certain parts of your foot and ankle and try to move them in certain ways.  X-ray imaging. These may be taken to see how bad the sprain is and to check for broken bones. How is this treated? This condition may be treated with:  A brace or splint. This is used to keep the ankle from moving until it heals.  An elastic bandage. This is used to support the ankle.  Crutches.  Pain medicine.  Surgery. This may be needed if the sprain is very bad.  Physical therapy. This may help to improve movement in the ankle. Follow these instructions at home: If you have a brace or a splint:  Wear the brace or splint as told by your doctor. Remove it only as told by your doctor.  Loosen the brace or splint if your toes: ? Tingle. ? Lose feeling (become  numb). ? Turn cold and blue.  Keep the brace or splint clean.  If the brace or splint is not waterproof: ? Do not let it get wet. ? Cover it with a watertight covering when you take a bath or a shower. If you have an elastic bandage (dressing):  Remove it to shower or bathe.  Try not to move your ankle much, but wiggle your toes from time to time. This helps to prevent swelling.  Adjust the dressing if it feels too tight.  Loosen the dressing if your foot: ? Loses feeling. ? Tingles. ? Becomes cold and blue. Managing pain, stiffness, and swelling   Take over-the-counter and prescription medicines only as told by doctor.  For 2-3 days, keep your ankle raised (elevated) above the level of your heart.  If told, put ice on the injured area: ? If you have a removable brace or splint, remove it as told by your doctor. ? Put ice in a plastic bag. ? Place a towel between your skin and the bag. ?  Leave the ice on for 20 minutes, 2-3 times a day. General instructions  Rest your ankle.  Do not use your injured leg to support your body weight until your doctor says that you can. Use crutches as told by your doctor.  Do not use any products that contain nicotine or tobacco, such as cigarettes, e-cigarettes, and chewing tobacco. If you need help quitting, ask your doctor.  Keep all follow-up visits as told by your doctor. Contact a doctor if:  Your bruises or swelling are quickly getting worse.  Your pain does not get better after you take medicine. Get help right away if:  You cannot feel your toes or foot.  Your foot or toes look blue.  You have very bad pain that gets worse. Summary  An ankle sprain is a stretch or tear in one of the tough tissues (ligaments) that connect the bones in your ankle.  This condition is caused by rolling or twisting the ankle.  Symptoms include pain, swelling, bruising, and trouble walking.  To help with pain and swelling, put ice on the  injured ankle, raise your ankle above the level of your heart, and use an elastic bandage. Also, rest as told by your doctor.  Keep all follow-up visits as told by your doctor. This is important. This information is not intended to replace advice given to you by your health care provider. Make sure you discuss any questions you have with your health care provider. Document Released: 11/10/2007 Document Revised: 10/18/2017 Document Reviewed: 10/18/2017 Elsevier Patient Education  2020 [employer].

## 2019-01-26 NOTE — Progress Notes (Signed)
Patient ID: Felicia Poole, female    DOB: February 25, 1946, 73 y.o.   MRN: [id number]  PCP: Steele Sizer, MD  Chief Complaint  Patient presents with  . Fall    fell and hurt her leg, right ankle is swollen. She states that it is getting better.    Subjective:   Felicia Poole is a 73 y.o. female, presents to clinic with CC of the following:  HPI  Right ankle swelling, had a fall about a month ago.  She slipped on a wet surface and her legs went out from underneath her.  She was able to get up and walk without difficulty at that time, however she is walking a little different than her normal.  She never got checked out after the fall.  She denies any other injuries, denies head injury or LOC. She walks with a limp, the swelling and bruising have persisted, are better with elevation and resting and worse with ambulation.  She denies pain today in exam room.  She continues to endorse gradual improvement, but she is limping and she doesn't put full weight on right leg, requires assistance to get up and down off exam table.  She does have a cane at home but does not use it.  Other than elevating leg she has not done anything for it.  She asks for a boot or ace wrap here today.  She denies any hx of numbess to LE/feet/toes.   She also brings in all her medicine with her daughter today.  She is out of a few meds, but coming back next week for routine visit and labs to address.  Completely out of her levothyroxine which I refilled today and others have enough to wait until next week.  Patient Active Problem List   Diagnosis Date Noted  . Angina pectoris ([place]) 04/11/2017  . Easy bruisability 04/29/2015  . Chronic anticoagulation 04/29/2015  . Adiposity 12/10/2014  . Obesity 12/10/2014  . Accumulation of fluid in tissues 10/15/2013  . Hypercholesterolemia 10/15/2013  . Encounter for therapeutic drug monitoring 08/08/2013  . Pacemaker -Medtronic 05/24/2013  . Nonischemic cardiomyopathy ([place])    . Atrial fibrillation ([place]) 02/28/2013  . Other malaise 02/26/2013  . Arteriosclerosis of coronary artery 02/22/2013  . BP (high blood pressure) 02/22/2013  . Adult hypothyroidism 02/22/2013  . Congestive heart failure with left ventricular systolic dysfunction ([place]) 02/22/2013  . Type 2 diabetes mellitus ([place]) 02/22/2013  . 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 Essential hypertension, benign   . Atrial myxoma   . Coronary atherosclerosis       Current Outpatient Medications:  .  alendronate (FOSAMAX) 70 MG tablet, Take 70 mg by mouth once a week. , Disp: , Rfl:  .  amLODipine (NORVASC) 10 MG tablet, Take 1 tablet (10 mg total) by mouth daily., Disp: 90 tablet, Rfl: 3 .  atorvastatin (LIPITOR) 80 MG tablet, Take 1 tablet (80 mg total) by mouth daily., Disp: 90 tablet, Rfl: 3 .  chlorthalidone (HYGROTON) 25 MG tablet, Take 1 tablet (25 mg total) by mouth daily., Disp: 90 tablet, Rfl: 3 .  fluticasone (FLONASE) 50 MCG/ACT nasal spray, SPRAY 1 SPRAY IN EACH NOSTRIL 2 TIMES A DAY, Disp: 1 g, Rfl: 3 .  furosemide (LASIX) 40 MG tablet, Take 1 tablet (40 mg total) by mouth as needed., Disp: 30 tablet, Rfl: 3 .  Gauze Pads & Dressings (ABDOMINAL PAD) [id number]" PADS, 1 application by Does not apply route 4 (four) times daily., Disp: 360 each, Rfl:  5 .  Incontinence Supply Disposable (ADHERES INCONTINENCE PAD) MISC, 1 application by Does not apply route 4 (four) times daily., Disp: 100 each, Rfl: 6 .  levothyroxine (SYNTHROID) 88 MCG tablet, Take 1 tablet (88 mcg total) by mouth daily., Disp: 90 tablet, Rfl: 0 .  lisinopril (PRINIVIL,ZESTRIL) 20 MG tablet, TAKE 1 TABLET (20 MG TOTAL) BY MOUTH DAILY. (Patient not taking: Reported on 10/17/2018), Disp: 90 tablet, Rfl: 3 .  meloxicam (MOBIC) 15 MG tablet, Take 1 tablet (15 mg total) by mouth daily. (Patient not taking: Reported on 10/17/2018), Disp: 30 tablet, Rfl: 3 .  nystatin (MYCOSTATIN/NYSTOP) powder, Apply topically 2 (two) times daily., Disp:  45 g, Rfl: 0 .  olopatadine (PATANOL) 0.1 % ophthalmic solution, Place 1 drop into both eyes 2 (two) times daily. (Patient not taking: Reported on 10/17/2018), Disp: 5 mL, Rfl: 3 .  OS-CAL CALCIUM + D3 500-200 MG-UNIT TABS, TAKE 1 TABLET BY MOUTH EVERY DAY, Disp: 90 tablet, Rfl: 0 .  ranitidine (ZANTAC) 300 MG tablet, TAKE 1 TABLET BY MOUTH EVERYDAY AT BEDTIME, Disp: 90 tablet, Rfl: 0 .  sotalol (BETAPACE) 80 MG tablet, Take 1 tablet (80 mg total) by mouth 2 (two) times daily., Disp: 180 tablet, Rfl: 3 .  Vitamin D, Ergocalciferol, (DRISDOL) 50000 units CAPS capsule, Take by mouth., Disp: , Rfl:  .  warfarin (COUMADIN) 5 MG tablet, Take as directed by the coumadin clinic., Disp: 60 tablet, Rfl: 0   No Known Allergies   Family History  Problem Relation Age of Onset  . Heart failure Mother   . Cirrhosis Mother   . Hyperlipidemia Father   . Hypertension Father   . Stroke Father   . Heart attack Father      Social History   Socioeconomic History  . Marital status: Widowed    Spouse name: Gwenlyn Perking  . Number of children: 5  . Years of education: Not on file  . Highest education level: 9th grade  Occupational History    Employer: DISABLED  Social Needs  . Financial resource strain: Not hard at all  . Food insecurity    Worry: Never true    Inability: Never true  . Transportation needs    Medical: No    Non-medical: No  Tobacco Use  . Smoking status: Never Smoker  . Smokeless tobacco: Never Used  . Tobacco comment: smoking cessation materials not required  Substance and Sexual Activity  . Alcohol use: No  . Drug use: No  . Sexual activity: Never  Lifestyle  . Physical activity    Days per week: 0 days    Minutes per session: 0 min  . Stress: Not at all  Relationships  . Social Herbalist on phone: Patient refused    Gets together: Patient refused    Attends religious service: Patient refused    Active member of club or organization: Patient refused     Attends meetings of clubs or organizations: Patient refused    Relationship status: Married  . Intimate partner violence    Fear of current or ex partner: No    Emotionally abused: No    Physically abused: No    Forced sexual activity: No  Other Topics Concern  . Not on file  Social History Narrative  . Not on file    I personally reviewed active problem list, medication list with the patient/caregiver today.  Review of Systems  Constitutional: Negative.   HENT: Negative.   Eyes: Negative.  Respiratory: Negative.   Cardiovascular: Negative.   Gastrointestinal: Negative.   Endocrine: Negative.   Genitourinary: Negative.   Musculoskeletal: Negative.   Skin: Negative.   Allergic/Immunologic: Negative.   Neurological: Negative.   Hematological: Negative.   Psychiatric/Behavioral: Negative.   All other systems reviewed and are negative.      Objective:   Vitals:   01/26/19 1429 01/26/19 1431 01/26/19 1505  BP: (!) 170/120 (!) 150/100 140/88  Pulse: 70    Resp: 16    Temp: (!) 97.5 F (36.4 C)    TempSrc: Temporal    SpO2: 98%    Weight: 134 lb (60.8 kg)    Height: 5' (1.524 m)      Body mass index is 26.17 kg/m.  Physical Exam Vitals signs and nursing note reviewed.  Constitutional:      Appearance: She is well-developed.  HENT:     Head: Normocephalic and atraumatic.     Nose: Nose normal.  Eyes:     General:        Right eye: No discharge.        Left eye: No discharge.     Conjunctiva/sclera: Conjunctivae normal.  Neck:     Trachea: No tracheal deviation.  Cardiovascular:     Rate and Rhythm: Normal rate and regular rhythm.  Pulmonary:     Effort: Pulmonary effort is normal. No respiratory distress.     Breath sounds: No stridor.  Musculoskeletal:     Right ankle: She exhibits decreased range of motion and swelling. She exhibits no deformity. No tenderness.     Comments: Right ankle diffusely edematous and swollen with some bruising to the  anterior aspect, no bony tenderness, no visible deformity, good capillary refill but pulses not palpable due to the swelling, skin is warm to the touch, no erythema. Patient has antalgic gait walks with her right foot rotated externally 75 to 90 degrees and cannot bear her full weight on her right leg, decreased right ankle ROM No right calf tenderness  Skin:    General: Skin is warm and dry.     Findings: No rash.  Neurological:     Mental Status: She is alert.     Motor: No abnormal muscle tone.     Coordination: Coordination normal.  Psychiatric:        Behavior: Behavior normal.      Results for orders placed or performed in visit on 01/03/19  POCT INR  Result Value Ref Range   INR 2.2 2.0 - 3.0        Assessment & Plan:      ICD-10-CM   1. Right ankle swelling  M25.471 DG Ankle Complete Right    Ambulatory referral to Orthopedics   remote injury - 1 month ago, Xray, RICE therapy, still swelling with use and abnormal gait, likely will need ortho eval - referred  2. Injury of right ankle, initial encounter  S99.911A Ambulatory referral to Orthopedics  3. Adult hypothyroidism  E03.9 levothyroxine (SYNTHROID) 88 MCG tablet   out of meds, refilled, has appt next week for routine f/up    RICE therapy, use of ace wrap - all reviewed with the pt and with her daughter today.  BP elevated, will monitor at home, and will be rechecked at f/up next week.  No sx today, does have all her meds for HTN.  Defer to PCP to continue management.  BP was rechecked before she left and it was improved to 140/88.  Delsa Grana, PA-C 01/26/19 2:31 PM

## 2019-01-27 ENCOUNTER — Ambulatory Visit
Admission: RE | Admit: 2019-01-27 | Discharge: 2019-01-27 | Disposition: A | Discharge status: Home / Self Care | Payer: Medicare Other | Source: Ambulatory Visit | Attending: Family Medicine | Admitting: Family Medicine

## 2019-01-27 DIAGNOSIS — M25471 Effusion, right ankle: Secondary | ICD-10-CM | POA: Insufficient documentation

## 2019-01-27 DIAGNOSIS — S82831D Other fracture of upper and lower end of right fibula, subsequent encounter for closed fracture with routine healing: Secondary | ICD-10-CM | POA: Diagnosis not present

## 2019-01-27 DIAGNOSIS — M7731 Calcaneal spur, right foot: Secondary | ICD-10-CM | POA: Diagnosis not present

## 2019-01-29 NOTE — Progress Notes (Signed)
Xrays obtained over the weekend which show right distal fibular fracture - pt notified and referral coordinator working on getting into ortho urgently.   Injury from a month ago.  Thankfully shows some healing/periosteal reaction, but pt has been walking on it since time of injury, and she was diffusely swollen and w/o bony tenderness so she was not immobilized and she and her daughter last Friday chose to delay the xray until the weekend.  They will be called this am with the plan to get into ortho, and instructed to avoid movement and weightbearing as much as possible until seen by ortho.   CLINICAL DATA:  Right ankle swelling.  Fall 1 month prior.  EXAM: RIGHT ANKLE - COMPLETE 3+ VIEW  COMPARISON:  None.  FINDINGS: Nondisplaced subacute oblique distal metaphysis right fibula fracture with sclerosis and periosteal reaction indicating healing response. No additional fracture. No subluxation. Diffuse right ankle soft tissue swelling. No radiopaque foreign body. No suspicious focal osseous lesions. Small plantar right calcaneal spur.  IMPRESSION: Subacute healing nondisplaced distal right fibula fracture. No subluxation. Diffuse right ankle soft tissue swelling.   Electronically Signed   By: Ilona Sorrel M.D.   On: 01/27/2019 14:12  Reviewed results and personally reviewed images this am    ICD-10-CM   1. Closed fracture of distal end of right fibula, unspecified fracture morphology, initial encounter  S82.831A    Rxays completed over the weekend or remote injury, show healing rt distal fibula fx, pt called, advised to continue to avoid weight bearing and see ortho ASAP  2. Right ankle swelling  M25.471 DG Ankle Complete Right    Ambulatory referral to Orthopedics   remote injury - 1 month ago, Xray, RICE therapy, still swelling with use and abnormal gait, likely will need ortho eval - referred  3. Injury of right ankle, initial encounter  S99.911A Ambulatory referral to  Orthopedics  4. Adult hypothyroidism  E03.9 levothyroxine (SYNTHROID) 88 MCG tablet   out of meds, refilled, has appt next week for routine f/up     Delsa Grana, PA-C 01/29/19 8:47 AM

## 2019-01-30 DIAGNOSIS — S82831A Other fracture of upper and lower end of right fibula, initial encounter for closed fracture: Secondary | ICD-10-CM | POA: Diagnosis not present

## 2019-02-05 ENCOUNTER — Encounter: Payer: Self-pay | Admitting: Family Medicine

## 2019-02-05 ENCOUNTER — Ambulatory Visit (INDEPENDENT_AMBULATORY_CARE_PROVIDER_SITE_OTHER): Payer: Medicare Other | Admitting: Family Medicine

## 2019-02-05 ENCOUNTER — Other Ambulatory Visit: Payer: Self-pay

## 2019-02-05 VITALS — BP 124/72 | HR 83 | Temp 97.4°F | Resp 14 | Ht 60.0 in | Wt 131.6 lb

## 2019-02-05 DIAGNOSIS — M8000XA Age-related osteoporosis with current pathological fracture, unspecified site, initial encounter for fracture: Secondary | ICD-10-CM

## 2019-02-05 DIAGNOSIS — I209 Angina pectoris, unspecified: Secondary | ICD-10-CM

## 2019-02-05 DIAGNOSIS — E2839 Other primary ovarian failure: Secondary | ICD-10-CM

## 2019-02-05 DIAGNOSIS — J302 Other seasonal allergic rhinitis: Secondary | ICD-10-CM

## 2019-02-05 DIAGNOSIS — E039 Hypothyroidism, unspecified: Secondary | ICD-10-CM

## 2019-02-05 DIAGNOSIS — F339 Major depressive disorder, recurrent, unspecified: Secondary | ICD-10-CM

## 2019-02-05 DIAGNOSIS — R739 Hyperglycemia, unspecified: Secondary | ICD-10-CM

## 2019-02-05 DIAGNOSIS — Z1231 Encounter for screening mammogram for malignant neoplasm of breast: Secondary | ICD-10-CM | POA: Diagnosis not present

## 2019-02-05 DIAGNOSIS — N183 Chronic kidney disease, stage 3 unspecified: Secondary | ICD-10-CM

## 2019-02-05 DIAGNOSIS — I428 Other cardiomyopathies: Secondary | ICD-10-CM

## 2019-02-05 DIAGNOSIS — E785 Hyperlipidemia, unspecified: Secondary | ICD-10-CM

## 2019-02-05 DIAGNOSIS — Z8639 Personal history of other endocrine, nutritional and metabolic disease: Secondary | ICD-10-CM

## 2019-02-05 DIAGNOSIS — Z23 Encounter for immunization: Secondary | ICD-10-CM | POA: Diagnosis not present

## 2019-02-05 DIAGNOSIS — S82831A Other fracture of upper and lower end of right fibula, initial encounter for closed fracture: Secondary | ICD-10-CM

## 2019-02-05 DIAGNOSIS — N393 Stress incontinence (female) (male): Secondary | ICD-10-CM

## 2019-02-05 MED ORDER — "ABDOMINAL PAD 8""X10"" PADS": 1.0000 "application " | MEDICATED_PAD | Freq: Four times a day (QID) | 1 refills | Status: DC

## 2019-02-05 MED ORDER — SERTRALINE HCL 50 MG PO TABS: 50.0000 mg | ORAL_TABLET | Freq: Every day | ORAL | 1 refills | Status: DC

## 2019-02-05 MED ORDER — FLUTICASONE PROPIONATE 50 MCG/ACT NA SUSP: NASAL | 1 refills | Status: DC

## 2019-02-05 MED ORDER — CVS BLADDER CONTROL PAD MISC: 1.0000 | Freq: Every day | 1 refills | Status: DC

## 2019-02-05 MED ORDER — ALENDRONATE SODIUM 70 MG PO TABS: 70.0000 mg | ORAL_TABLET | ORAL | 4 refills | Status: DC

## 2019-02-05 MED ORDER — VITAMIN D (ERGOCALCIFEROL) 1.25 MG (50000 UNIT) PO CAPS: 50000.0000 [IU] | ORAL_CAPSULE | ORAL | 3 refills | Status: DC

## 2019-02-05 NOTE — Progress Notes (Signed)
Name: Felicia Poole   MRN: [id number]    DOB: 02-13-1946   Date:02/05/2019       Progress Note  Subjective  Chief Complaint  Chief Complaint  Patient presents with  . Medication Refill  . Diabetes  . Hypertension  . Hyperlipidemia  . Hypothyroidism  . 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 Rockey Situ, taking coumadin and has easy bruising on both arms, she denies bleeding in urine , bowel or gum. She is compliant. She has SOB with activity, she has orthopnea. Denies PND or chest pain.   History of diabetes: she has not been on medications for over 3 years , last A1C one year ago was 5.8%, she denies polyphagia, polydipsia or polyuria. She has lost weight but has been gradual, changed her diet   Hyperlipidemia: taking statin therapy and denies side effects  Major Depression: lost her husband 4 years ago , her daughter is here and states she seems forgetful at times, she still cries and misses her husband. She has appetite that is on and off. Phq 9 positive. Per daughter she has a long history of depression  Osteoporosis: she used to take fosamax and vitamin D however lost to follow up, recently had a fracture right fibula, willing to resume medication. Wearing brace and pain is under control   Incontinence: she wears a pull ups at night and also needs bed pads.   Patient Active Problem List   Diagnosis Date Noted  . Angina pectoris ([place]) 04/11/2017  . Senile purpura ([place]) 04/29/2015  . Chronic anticoagulation 04/29/2015  . Obesity 12/10/2014  . Hypercholesterolemia 10/15/2013  . Encounter for therapeutic drug monitoring 08/08/2013  . Pacemaker -Medtronic 05/24/2013  . Nonischemic cardiomyopathy ([place])   . Atrial fibrillation ([place]) 02/28/2013  . Arteriosclerosis of coronary artery 02/22/2013  . Adult hypothyroidism 02/22/2013  .  Congestive heart failure with left ventricular systolic dysfunction ([place]) 02/22/2013  . Tachycardia-bradycardia ([place]) 02/22/2013  . Essential hypertension, benign   . Atrial myxoma   . Coronary atherosclerosis     Past Surgical History:  Procedure Laterality Date  . ATRIAL MYXOMA EXCISION Left   . CARDIAC CATHETERIZATION  2012   [facility]; right and left heart 75% ostial LAD lesion  . CORONARY ARTERY BYPASS GRAFT  2011   [facility]  . INSERT / REPLACE / REMOVE PACEMAKER  02/2013   Dual chamber MDT advisa pacemaker. MVP-R70  . VAGINAL HYSTERECTOMY      Family History  Problem Relation Age of Onset  . Heart failure Mother   . Cirrhosis Mother   . Hyperlipidemia Father   . Hypertension Father   . Stroke Father   . Heart attack Father     Social History   Socioeconomic History  . Marital status: Widowed    Spouse name: Gwenlyn Perking  . Number of children: 5  . Years of education: Not on file  . Highest education level: 9th grade  Occupational History    Employer: DISABLED  Social Needs  . Financial resource strain: Not hard at all  . Food insecurity    Worry: Never true    Inability: Never true  . Transportation needs    Medical: No    Non-medical: No  Tobacco Use  . Smoking status: Never Smoker  . Smokeless tobacco: Never Used  . Tobacco comment: smoking cessation materials not required  Substance and Sexual Activity  .  Alcohol use: No  . Drug use: No  . Sexual activity: Never  Lifestyle  . Physical activity    Days per week: 0 days    Minutes per session: 0 min  . Stress: Not at all  Relationships  . Social Herbalist on phone: Patient refused    Gets together: Patient refused    Attends religious service: Patient refused    Active member of club or organization: Patient refused    Attends meetings of clubs or organizations: Patient refused    Relationship status: Married  . Intimate partner violence    Fear of current or ex partner: No    Emotionally abused:  No    Physically abused: No    Forced sexual activity: No  Other Topics Concern  . Not on file  Social History Narrative  . Not on file     Current Outpatient Medications:  .  amLODipine (NORVASC) 10 MG tablet, Take 1 tablet (10 mg total) by mouth daily., Disp: 90 tablet, Rfl: 3 .  atorvastatin (LIPITOR) 80 MG tablet, Take 1 tablet (80 mg total) by mouth daily., Disp: 90 tablet, Rfl: 3 .  chlorthalidone (HYGROTON) 25 MG tablet, Take 1 tablet (25 mg total) by mouth daily., Disp: 90 tablet, Rfl: 3 .  fluticasone (FLONASE) 50 MCG/ACT nasal spray, 2 sprays q nostril qhs, Disp: 48 g, Rfl: 1 .  furosemide (LASIX) 40 MG tablet, Take 1 tablet (40 mg total) by mouth as needed., Disp: 30 tablet, Rfl: 3 .  Incontinence Supply Disposable (ADHERES INCONTINENCE PAD) MISC, 1 application by Does not apply route 4 (four) times daily., Disp: 100 each, Rfl: 6 .  levothyroxine (SYNTHROID) 88 MCG tablet, Take 1 tablet (88 mcg total) by mouth daily., Disp: 90 tablet, Rfl: 0 .  lisinopril (PRINIVIL,ZESTRIL) 20 MG tablet, TAKE 1 TABLET (20 MG TOTAL) BY MOUTH DAILY., Disp: 90 tablet, Rfl: 3 .  nystatin (MYCOSTATIN/NYSTOP) powder, Apply topically 2 (two) times daily., Disp: 45 g, Rfl: 0 .  sotalol (BETAPACE) 80 MG tablet, Take 1 tablet (80 mg total) by mouth 2 (two) times daily., Disp: 180 tablet, Rfl: 3 .  warfarin (COUMADIN) 5 MG tablet, Take as directed by the coumadin clinic., Disp: 60 tablet, Rfl: 0 .  alendronate (FOSAMAX) 70 MG tablet, Take 1 tablet (70 mg total) by mouth once a week. With full glass of water and do not recline, Disp: 12 tablet, Rfl: 4 .  OS-CAL CALCIUM + D3 500-200 MG-UNIT TABS, TAKE 1 TABLET BY MOUTH EVERY DAY (Patient not taking: Reported on 02/05/2019), Disp: 90 tablet, Rfl: 0 .  sertraline (ZOLOFT) 50 MG tablet, Take 1 tablet (50 mg total) by mouth daily., Disp: 30 tablet, Rfl: 1 .  Vitamin D, Ergocalciferol, (DRISDOL) 1.25 MG (50000 UT) CAPS capsule, Take 1 capsule (50,000 Units total) by  mouth every 7 (seven) days., Disp: 12 capsule, Rfl: 3  No Known Allergies  I personally reviewed active problem list, medication list, allergies, family history, social history with the patient/caregiver today.   ROS  Constitutional: Negative for fever or weight change.  Respiratory: Negative for cough  Positive for shortness of breath.   Cardiovascular: Negative for chest pain or palpitations.  Gastrointestinal: Negative for abdominal pain, no bowel changes.  Musculoskeletal: Positive  for gait problem and right ankle  joint swelling.  Skin: Negative for rash.  Neurological: Negative for dizziness or headache.  No other specific complaints in a complete review of systems (except as listed in  HPI above).   Objective  Vitals:   02/05/19 1409  BP: 124/72  Pulse: 83  Resp: 14  Temp: (!) 97.4 F (36.3 C)  TempSrc: Temporal  SpO2: 96%  Weight: 131 lb 9.6 oz (59.7 kg)  Height: 5' (1.524 m)    Body mass index is 25.7 kg/m.  Physical Exam  Constitutional: Patient appears well-developed and well-nourished.  No distress.  HEENT: head atraumatic, normocephalic, pupils equal and reactive to light,  neck supple Cardiovascular: Normal rate, regular rhythm and normal heart sounds.  No murmur heard.Trace BLE edema. Pulmonary/Chest: Effort normal and breath sounds normal. No respiratory distress. Muscular skeletal: she is still able to walk, wearing a brace right lower leg  Abdominal: Soft.  There is no tenderness. Psychiatric: Patient has a normal mood and affect. behavior is normal. Judgment and thought content normal.  Recent Results (from the past 2160 hour(s))  POCT INR     Status: None   Collection Time: 11/22/18 12:14 PM  Result Value Ref Range   INR 2.9 2.0 - 3.0  POCT INR     Status: None   Collection Time: 01/03/19 12:52 PM  Result Value Ref Range   INR 2.2 2.0 - 3.0     PHQ2/9: Depression screen [facility] 2/9 02/05/2019 01/26/2019 01/24/2018 05/03/2017 01/24/2017   Decreased Interest 2 0 0 0 0  Down, Depressed, Hopeless 2 3 0 1 1  PHQ - 2 Score 4 3 0 1 1  Altered sleeping 3 3 0 - -  Tired, decreased energy 1 0 0 - -  Change in appetite 0 0 0 - -  Feeling bad or failure about yourself  1 1 0 - -  Trouble concentrating 0 0 0 - -  Moving slowly or fidgety/restless 3 1 0 - -  Suicidal thoughts 0 0 0 - -  PHQ-9 Score 12 8 0 - -  Difficult doing work/chores Not difficult at all Not difficult at all Not difficult at all - -    phq 9 is positive   Fall Risk: Fall Risk  02/05/2019 01/26/2019 01/24/2018 10/11/2017 05/03/2017  Falls in the past year? 1 1 Yes No No  Comment - - fell getting out of church bus - -  Number falls in past yr: 1 1 1  - -  Injury with Fall? 1 1 No - -  Risk for fall due to : - - Impaired vision;Medication side effect - -  Risk for fall due to: Comment - - wears eyeglasses - -  Follow up - - Falls evaluation completed;Education provided;Falls prevention discussed - -     Functional Status Survey: Is the patient deaf or have difficulty hearing?: No Does the patient have difficulty seeing, even when wearing glasses/contacts?: No Does the patient have difficulty concentrating, remembering, or making decisions?: No Does the patient have difficulty walking or climbing stairs?: Yes Does the patient have difficulty dressing or bathing?: Yes Does the patient have difficulty doing errands alone such as visiting a doctor's office or shopping?: Yes    Assessment & Plan   1. Adult hypothyroidism  Continue medication   2. History of diabetes mellitus  - Hemoglobin A1c  3. Hyperglycemia  - Hemoglobin A1c  4. Dyslipidemia  - Lipid panel  5. Chronic kidney disease, stage III (moderate) (HCC)  - COMPLETE METABOLIC PANEL WITH GFR   6. Closed fracture of distal end of right fibula, unspecified fracture morphology, initial encounter   7. Encounter for screening mammogram for breast cancer  -  MM 3D SCREEN BREAST  BILATERAL; Future  8. Ovarian failure  - DG Bone Density; Future  9. Seasonal allergies  - fluticasone (FLONASE) 50 MCG/ACT nasal spray; 2 sprays q nostril qhs  Dispense: 48 g; Refill: 1  10. Osteoporosis with current pathological fracture, unspecified osteoporosis type, initial encounter  - alendronate (FOSAMAX) 70 MG tablet; Take 1 tablet (70 mg total) by mouth once a week. With full glass of water and do not recline  Dispense: 12 tablet; Refill: 4 - Vitamin D, Ergocalciferol, (DRISDOL) 1.25 MG (50000 UT) CAPS capsule; Take 1 capsule (50,000 Units total) by mouth every 7 (seven) days.  Dispense: 12 capsule; Refill: 3  11. Major depression, recurrent, chronic (HCC)  - sertraline (ZOLOFT) 50 MG tablet; Take 1 tablet (50 mg total) by mouth daily.  Dispense: 30 tablet; Refill: 1  12. Nonischemic cardiomyopathy ([place])   13. Angina pectoris ([place])  Denies any current symptoms   14. Stress incontinence of urine  - Incontinence Supply Disposable ([employer] BLADDER CONTROL PAD) MISC; 1 each by Does not apply route daily.  Dispense: 100 each; Refill: 1 - Gauze Pads & Dressings (ABDOMINAL PAD) [id number]" PADS; 1 application by Does not apply route 4 (four) times daily.  Dispense: 360 each; Refill: 1

## 2019-02-06 LAB — COMPLETE METABOLIC PANEL WITH GFR
AG Ratio: 1.5 (calc) (ref 1.0–2.5)
ALT: 23 U/L (ref 6–29)
AST: 25 U/L (ref 10–35)
Albumin: 4.1 g/dL (ref 3.6–5.1)
Alkaline phosphatase (APISO): 85 U/L (ref 37–153)
BUN/Creatinine Ratio: 16 (calc) (ref 6–22)
BUN: 16 mg/dL (ref 7–25)
CO2: 30 mmol/L (ref 20–32)
Calcium: 9.9 mg/dL (ref 8.6–10.4)
Chloride: 105 mmol/L (ref 98–110)
Creat: 1.02 mg/dL — ABNORMAL HIGH (ref 0.60–0.93)
GFR, Est African American: 63 mL/min/{1.73_m2} (ref >=60)
GFR, Est Non African American: 55 mL/min/{1.73_m2} — ABNORMAL LOW (ref >=60)
Globulin: 2.7 g/dL (calc) (ref 1.9–3.7)
Glucose, Bld: 102 mg/dL — ABNORMAL HIGH (ref 65–99)
Potassium: 4.4 mmol/L (ref 3.5–5.3)
Sodium: 141 mmol/L (ref 135–146)
Total Bilirubin: 1 mg/dL (ref 0.2–1.2)
Total Protein: 6.8 g/dL (ref 6.1–8.1)

## 2019-02-06 LAB — LIPID PANEL
Cholesterol: 115 mg/dL (ref <200)
HDL: 43 mg/dL — ABNORMAL LOW (ref >=50)
LDL Cholesterol (Calc): 51 mg/dL (calc)
Non-HDL Cholesterol (Calc): 72 mg/dL (calc) (ref <130)
Total CHOL/HDL Ratio: 2.7 (calc) (ref <5.0)
Triglycerides: 124 mg/dL (ref <150)

## 2019-02-06 LAB — HEMOGLOBIN A1C
Hgb A1c MFr Bld: 5.9 % of total Hgb — ABNORMAL HIGH (ref <5.7)
Mean Plasma Glucose: 123 (calc)
eAG (mmol/L): 6.8 (calc)

## 2019-02-13 ENCOUNTER — Encounter: Payer: Medicare Other | Admitting: Internal Medicine

## 2019-02-20 DIAGNOSIS — S82831D Other fracture of upper and lower end of right fibula, subsequent encounter for closed fracture with routine healing: Secondary | ICD-10-CM | POA: Diagnosis not present

## 2019-02-28 ENCOUNTER — Ambulatory Visit (INDEPENDENT_AMBULATORY_CARE_PROVIDER_SITE_OTHER): Payer: Medicare Other

## 2019-02-28 ENCOUNTER — Other Ambulatory Visit: Payer: Self-pay

## 2019-02-28 DIAGNOSIS — Z5181 Encounter for therapeutic drug level monitoring: Secondary | ICD-10-CM

## 2019-02-28 DIAGNOSIS — I4891 Unspecified atrial fibrillation: Secondary | ICD-10-CM

## 2019-02-28 LAB — POCT INR: INR: 6.7 — AB (ref 2.0–3.0)

## 2019-02-28 NOTE — Patient Instructions (Signed)
Please have a large serving of greens today, skip warfarin for 3 days, the resume on Saturday your dosage of 1/2 tablet every day. Recheck in 1 week.

## 2019-03-05 ENCOUNTER — Other Ambulatory Visit: Payer: Self-pay | Admitting: Family Medicine

## 2019-03-05 DIAGNOSIS — F339 Major depressive disorder, recurrent, unspecified: Secondary | ICD-10-CM

## 2019-03-07 ENCOUNTER — Encounter: Payer: Self-pay | Admitting: Internal Medicine

## 2019-03-07 NOTE — Telephone Encounter (Signed)
This encounter was created in error - please disregard.

## 2019-03-16 ENCOUNTER — Encounter: Payer: Self-pay | Admitting: Family Medicine

## 2019-03-16 ENCOUNTER — Ambulatory Visit (INDEPENDENT_AMBULATORY_CARE_PROVIDER_SITE_OTHER): Payer: Medicare Other | Admitting: Family Medicine

## 2019-03-16 ENCOUNTER — Other Ambulatory Visit: Payer: Self-pay

## 2019-03-16 VITALS — BP 124/76 | HR 98 | Temp 97.1°F | Resp 16 | Ht 60.0 in | Wt 127.0 lb

## 2019-03-16 DIAGNOSIS — E039 Hypothyroidism, unspecified: Secondary | ICD-10-CM

## 2019-03-16 DIAGNOSIS — I209 Angina pectoris, unspecified: Secondary | ICD-10-CM | POA: Diagnosis not present

## 2019-03-16 DIAGNOSIS — F339 Major depressive disorder, recurrent, unspecified: Secondary | ICD-10-CM | POA: Diagnosis not present

## 2019-03-16 DIAGNOSIS — E441 Mild protein-calorie malnutrition: Secondary | ICD-10-CM | POA: Diagnosis not present

## 2019-03-16 LAB — TSH: TSH: 1.05 mIU/L (ref 0.40–4.50)

## 2019-03-16 MED ORDER — SERTRALINE HCL 50 MG PO TABS: 50.0000 mg | ORAL_TABLET | Freq: Every day | ORAL | 1 refills | Status: DC

## 2019-03-16 MED ORDER — SERTRALINE HCL 50 MG PO TABS: 50.0000 mg | ORAL_TABLET | Freq: Every day | ORAL | 5 refills | Status: DC

## 2019-03-16 NOTE — Progress Notes (Signed)
Name: Felicia Poole   MRN: [id number]    DOB: 10-30-1945   Date:03/16/2019       Progress Note  Subjective  Chief Complaint  Chief Complaint  Patient presents with  . Follow-up    6 week F/U  . Depression    States she feels so much better, sleeping easier at night, moods have improved    HPI  Depression Major: we started her on Zoloft two months ago because she had Phq9 of 26 and daughter was concerned about her. She was down and seemed depressed, however since started on medication, phq 9 is down to 4 and some of the findings not secondary to depression . She denies side effects of medications, sleeping well, more motivation and energy. We will continue current regiment   Malnutrition: she has lost another 4 lbs since last visit and down 30 lbs in the past 2 years. She states good appetite, no blood in stools, no palpitation, no diarrhea, good appetite, no pain. We will check TSH since she has hypothyroidism. Discussed further testing but they are not interested at this time. They will try a high calorie diet and if she continues to lose weight she will have more labs and also get mammogram and repeat colonoscopy    Patient Active Problem List   Diagnosis Date Noted  . Angina pectoris ([place]) 04/11/2017  . Chronic anticoagulation 04/29/2015  . Obesity 12/10/2014  . Hypercholesterolemia 10/15/2013  . Encounter for therapeutic drug monitoring 08/08/2013  . Pacemaker -Medtronic 05/24/2013  . Nonischemic cardiomyopathy ([place])   . Atrial fibrillation ([place]) 02/28/2013  . Arteriosclerosis of coronary artery 02/22/2013  . Adult hypothyroidism 02/22/2013  . Congestive heart failure with left ventricular systolic dysfunction ([place]) 02/22/2013  . 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 Essential hypertension, benign   . Atrial myxoma   . Coronary atherosclerosis     Past Surgical History:  Procedure Laterality Date  . ATRIAL MYXOMA EXCISION Left   . CARDIAC CATHETERIZATION  2012    Unc; right and left heart 75% ostial LAD lesion  . CORONARY ARTERY BYPASS GRAFT  2011   [facility]  . INSERT / REPLACE / REMOVE PACEMAKER  02/2013   Dual chamber MDT advisa pacemaker. MVP-R70  . VAGINAL HYSTERECTOMY      Family History  Problem Relation Age of Onset  . Heart failure Mother   . Cirrhosis Mother   . Hyperlipidemia Father   . Hypertension Father   . Stroke Father   . Heart attack Father     Social History   Socioeconomic History  . Marital status: Widowed    Spouse name: Gwenlyn Perking  . Number of children: 5  . Years of education: Not on file  . Highest education level: 9th grade  Occupational History    Employer: DISABLED  Social Needs  . Financial resource strain: Not hard at all  . Food insecurity    Worry: Never true    Inability: Never true  . Transportation needs    Medical: No    Non-medical: No  Tobacco Use  . Smoking status: Never Smoker  . Smokeless tobacco: Never Used  . Tobacco comment: smoking cessation materials not required  Substance and Sexual Activity  . Alcohol use: No  . Drug use: No  . Sexual activity: Never  Lifestyle  . Physical activity    Days per week: 0 days    Minutes per session: 0 min  . Stress: Not at all  Relationships  . Social connections  Talks on phone: Patient refused    Gets together: Patient refused    Attends religious service: Patient refused    Active member of club or organization: Patient refused    Attends meetings of clubs or organizations: Patient refused    Relationship status: Widowed  . Intimate partner violence    Fear of current or ex partner: No    Emotionally abused: No    Physically abused: No    Forced sexual activity: No  Other Topics Concern  . Not on file  Social History Narrative  . Not on file     Current Outpatient Medications:  .  alendronate (FOSAMAX) 70 MG tablet, Take 1 tablet (70 mg total) by mouth once a week. With full glass of water and do not recline, Disp: 12 tablet, Rfl:  4 .  amLODipine (NORVASC) 10 MG tablet, Take 1 tablet (10 mg total) by mouth daily., Disp: 90 tablet, Rfl: 3 .  atorvastatin (LIPITOR) 80 MG tablet, Take 1 tablet (80 mg total) by mouth daily., Disp: 90 tablet, Rfl: 3 .  chlorthalidone (HYGROTON) 25 MG tablet, Take 1 tablet (25 mg total) by mouth daily., Disp: 90 tablet, Rfl: 3 .  fluticasone (FLONASE) 50 MCG/ACT nasal spray, 2 sprays q nostril qhs, Disp: 48 g, Rfl: 1 .  furosemide (LASIX) 40 MG tablet, Take 1 tablet (40 mg total) by mouth as needed., Disp: 30 tablet, Rfl: 3 .  Gauze Pads & Dressings (ABDOMINAL PAD) [id number]" PADS, 1 application by Does not apply route 4 (four) times daily., Disp: 360 each, Rfl: 1 .  Incontinence Supply Disposable ([employer] BLADDER CONTROL PAD) MISC, 1 each by Does not apply route daily., Disp: 100 each, Rfl: 1 .  levothyroxine (SYNTHROID) 88 MCG tablet, Take 1 tablet (88 mcg total) by mouth daily., Disp: 90 tablet, Rfl: 0 .  lisinopril (PRINIVIL,ZESTRIL) 20 MG tablet, TAKE 1 TABLET (20 MG TOTAL) BY MOUTH DAILY., Disp: 90 tablet, Rfl: 3 .  nystatin (MYCOSTATIN/NYSTOP) powder, Apply topically 2 (two) times daily., Disp: 45 g, Rfl: 0 .  OS-CAL CALCIUM + D3 500-200 MG-UNIT TABS, TAKE 1 TABLET BY MOUTH EVERY DAY, Disp: 90 tablet, Rfl: 0 .  sertraline (ZOLOFT) 50 MG tablet, Take 1 tablet (50 mg total) by mouth daily., Disp: 30 tablet, Rfl: 1 .  sotalol (BETAPACE) 80 MG tablet, Take 1 tablet (80 mg total) by mouth 2 (two) times daily., Disp: 180 tablet, Rfl: 3 .  Vitamin D, Ergocalciferol, (DRISDOL) 1.25 MG (50000 UT) CAPS capsule, Take 1 capsule (50,000 Units total) by mouth every 7 (seven) days., Disp: 12 capsule, Rfl: 3 .  warfarin (COUMADIN) 5 MG tablet, Take as directed by the coumadin clinic., Disp: 60 tablet, Rfl: 0  No Known Allergies  I personally reviewed active problem list, medication list, allergies, family history, social history with the patient/caregiver today.   ROS  Ten systems reviewed and is negative  except as mentioned in HPI   Objective  Vitals:   03/16/19 1330  BP: 124/76  Pulse: 98  Resp: 16  Temp: (!) 97.1 F (36.2 C)  TempSrc: Temporal  SpO2: 96%  Weight: 127 lb (57.6 kg)  Height: 5' (1.524 m)    Body mass index is 24.8 kg/m.  Physical Exam  Constitutional: Patient appears well-developed and frail .  No distress.  HEENT: head atraumatic, normocephalic, pupils equal and reactive to light Cardiovascular: Normal rate, regular rhythm and normal heart sounds.  No murmur heard. No BLE edema. Pulmonary/Chest: Effort normal and breath  sounds normal. No respiratory distress. Abdominal: Soft.  There is no tenderness. Psychiatric: Patient has a normal mood and affect. behavior is normal. Smiling   Recent Results (from the past 2160 hour(s))  POCT INR     Status: None   Collection Time: 01/03/19 12:52 PM  Result Value Ref Range   INR 2.2 2.0 - 3.0  Lipid panel     Status: Abnormal   Collection Time: 02/05/19 12:00 AM  Result Value Ref Range   Cholesterol 115 <200 mg/dL   HDL 43 (L) > OR = 50 mg/dL   Triglycerides 124 <150 mg/dL   LDL Cholesterol (Calc) 51 mg/dL (calc)    Comment: Reference range: <100 . Desirable range <100 mg/dL for primary prevention;   <70 mg/dL for patients with CHD or diabetic patients  with > or = 2 CHD risk factors. Marland Kitchen LDL-C is now calculated using the Martin-Hopkins  calculation, which is a validated novel method providing  better accuracy than the Friedewald equation in the  estimation of LDL-C.  Cresenciano Genre et al. Annamaria Helling. [id number]): 2061-2068  (http://education.[facility].com/faq/FAQ164)    Total CHOL/HDL Ratio 2.7 <5.0 (calc)   Non-HDL Cholesterol (Calc) 72 <130 mg/dL (calc)    Comment: For patients with diabetes plus 1 major ASCVD risk  factor, treating to a non-HDL-C goal of <100 mg/dL  (LDL-C of <70 mg/dL) is considered a therapeutic  option.   Hemoglobin A1c     Status: Abnormal   Collection Time: 02/05/19 12:00 AM   Result Value Ref Range   Hgb A1c MFr Bld 5.9 (H) <5.7 % of total Hgb    Comment: For someone without known diabetes, a hemoglobin  A1c value between 5.7% and 6.4% is consistent with prediabetes and should be confirmed with a  follow-up test. . For someone with known diabetes, a value <7% indicates that their diabetes is well controlled. A1c targets should be individualized based on duration of diabetes, age, comorbid conditions, and other considerations. . This assay result is consistent with an increased risk of diabetes. . Currently, no consensus exists regarding use of hemoglobin A1c for diagnosis of diabetes for children. .    Mean Plasma Glucose 123 (calc)   eAG (mmol/L) 6.8 (calc)  COMPLETE METABOLIC PANEL WITH GFR     Status: Abnormal   Collection Time: 02/05/19 12:00 AM  Result Value Ref Range   Glucose, Bld 102 (H) 65 - 99 mg/dL    Comment: .            Fasting reference interval . For someone without known diabetes, a glucose value between 100 and 125 mg/dL is consistent with prediabetes and should be confirmed with a follow-up test. .    BUN 16 7 - 25 mg/dL   Creat 1.02 (H) 0.60 - 0.93 mg/dL    Comment: For patients >85 years of age, the reference limit for Creatinine is approximately 13% higher for people identified as African-American. .    GFR, Est Non African American 55 (L) > OR = 60 mL/min/1.47m2   GFR, Est African American 63 > OR = 60 mL/min/1.87m2   BUN/Creatinine Ratio 16 6 - 22 (calc)   Sodium 141 135 - 146 mmol/L   Potassium 4.4 3.5 - 5.3 mmol/L   Chloride 105 98 - 110 mmol/L   CO2 30 20 - 32 mmol/L   Calcium 9.9 8.6 - 10.4 mg/dL   Total Protein 6.8 6.1 - 8.1 g/dL   Albumin 4.1 3.6 - 5.1 g/dL   Globulin  2.7 1.9 - 3.7 g/dL (calc)   AG Ratio 1.5 1.0 - 2.5 (calc)   Total Bilirubin 1.0 0.2 - 1.2 mg/dL   Alkaline phosphatase (APISO) 85 37 - 153 U/L   AST 25 10 - 35 U/L   ALT 23 6 - 29 U/L  POCT INR     Status: Abnormal   Collection Time:  02/28/19 11:18 AM  Result Value Ref Range   INR 6.7 (A) 2.0 - 3.0      PHQ2/9: Depression screen [facility] 2/9 03/16/2019 02/05/2019 01/26/2019 01/24/2018 05/03/2017  Decreased Interest 1 2 0 0 0  Down, Depressed, Hopeless 0 2 3 0 1  PHQ - 2 Score 1 4 3  0 1  Altered sleeping 0 3 3 0 -  Tired, decreased energy 1 1 0 0 -  Change in appetite 0 0 0 0 -  Feeling bad or failure about yourself  0 1 1 0 -  Trouble concentrating 1 0 0 0 -  Moving slowly or fidgety/restless 1 3 1  0 -  Suicidal thoughts 0 0 0 0 -  PHQ-9 Score 4 12 8  0 -  Difficult doing work/chores Somewhat difficult Not difficult at all Not difficult at all Not difficult at all -    phq 9 is positive   Fall Risk: Fall Risk  02/05/2019 01/26/2019 01/24/2018 10/11/2017 05/03/2017  Falls in the past year? 1 1 Yes No No  Comment - - fell getting out of church bus - -  Number falls in past yr: 1 1 1  - -  Injury with Fall? 1 1 No - -  Risk for fall due to : - - Impaired vision;Medication side effect - -  Risk for fall due to: Comment - - wears eyeglasses - -  Follow up - - Falls evaluation completed;Education provided;Falls prevention discussed - -      Assessment & Plan  1. Major depression, recurrent, chronic (HCC)  - sertraline (ZOLOFT) 50 MG tablet; Take 1 tablet (50 mg total) by mouth daily.  Dispense: 30 tablet; Refill: 1  2. Adult hypothyroidism  - TSH  3. Mild protein-calorie malnutrition ([place])   Discussed colonoscopy and mammogram with patient and daughter, she is feeling well, she eats, no pain, we will monitor and increase calories on her diet

## 2019-03-16 NOTE — Patient Instructions (Signed)

## 2019-03-20 ENCOUNTER — Encounter: Payer: Medicare Other | Admitting: Internal Medicine

## 2019-03-20 ENCOUNTER — Ambulatory Visit: Payer: Medicare Other | Admitting: Cardiovascular Disease

## 2019-03-28 ENCOUNTER — Ambulatory Visit (INDEPENDENT_AMBULATORY_CARE_PROVIDER_SITE_OTHER): Payer: Medicare Other

## 2019-03-28 ENCOUNTER — Other Ambulatory Visit: Payer: Self-pay

## 2019-03-28 DIAGNOSIS — I4891 Unspecified atrial fibrillation: Secondary | ICD-10-CM

## 2019-03-28 DIAGNOSIS — Z5181 Encounter for therapeutic drug level monitoring: Secondary | ICD-10-CM | POA: Diagnosis not present

## 2019-03-28 LAB — POCT INR: INR: 4.7 — AB (ref 2.0–3.0)

## 2019-03-28 MED ORDER — WARFARIN SODIUM 2.5 MG PO TABS: ORAL_TABLET | ORAL | 0 refills | Status: DC

## 2019-03-28 NOTE — Patient Instructions (Addendum)
I'm sending you in new warfarin pill - these are 2.5 mg. Skip warfarin tonight and tomorrow, then START NEW DOSAGE of 1 (2.5 mg) tablet every day EXCEPT 1/2 TABLET ON MONDAYS, [place]. Recheck in 2 weeks.

## 2019-04-03 ENCOUNTER — Telehealth (INDEPENDENT_AMBULATORY_CARE_PROVIDER_SITE_OTHER): Payer: Medicare Other | Admitting: Internal Medicine

## 2019-04-03 ENCOUNTER — Other Ambulatory Visit: Payer: Self-pay

## 2019-04-03 NOTE — Progress Notes (Signed)
No show

## 2019-04-11 ENCOUNTER — Ambulatory Visit (INDEPENDENT_AMBULATORY_CARE_PROVIDER_SITE_OTHER): Payer: Medicare Other

## 2019-04-11 ENCOUNTER — Other Ambulatory Visit: Payer: Self-pay | Admitting: Cardiovascular Disease

## 2019-04-11 ENCOUNTER — Other Ambulatory Visit: Payer: Self-pay

## 2019-04-11 DIAGNOSIS — Z5181 Encounter for therapeutic drug level monitoring: Secondary | ICD-10-CM | POA: Diagnosis not present

## 2019-04-11 DIAGNOSIS — I4891 Unspecified atrial fibrillation: Secondary | ICD-10-CM | POA: Diagnosis not present

## 2019-04-11 LAB — POCT INR: INR: 1 — AB (ref 2.0–3.0)

## 2019-04-11 NOTE — Patient Instructions (Signed)
I'm sending you in new warfarin pill - these are 2.5 mg. Take 2 tablets today (as soon as you pick up your meds), then START NEW DOSAGE of 1 (2.5 mg) tablet every day EXCEPT 1/2 TABLET ON MONDAYS, [place]. Recheck in 2 weeks.

## 2019-04-11 NOTE — Telephone Encounter (Signed)
Refill request for warfarin 

## 2019-04-24 ENCOUNTER — Telehealth: Payer: Self-pay

## 2019-04-24 NOTE — Telephone Encounter (Signed)
I got a message from a family member stating that this patient was questioning if it is ok for her to continue drinking juice since she now has a pacemaker.   I was going to tell them that she should be fine and that it should not have an affect on her  pacemaker but I wanted to do right and present this to her PCP first.  Please advise

## 2019-04-24 NOTE — Telephone Encounter (Signed)
Spoke with daughter and informed her that Mrs. Pedregon could drink juice and it won't impact her pacemaker. Then the daughter asked about getting Dr. Ancil Boozer to send her in something for heart burn. I informed her she would need a visit since this is a new problem.   Please call her daughter and schedule virtual visit with Dr. Ancil Boozer. Thanks.

## 2019-04-25 NOTE — Telephone Encounter (Signed)
appt scheduled

## 2019-04-26 ENCOUNTER — Other Ambulatory Visit: Payer: Self-pay

## 2019-04-26 ENCOUNTER — Encounter: Payer: Self-pay | Admitting: Family Medicine

## 2019-04-26 ENCOUNTER — Ambulatory Visit (INDEPENDENT_AMBULATORY_CARE_PROVIDER_SITE_OTHER): Payer: Medicare Other | Admitting: Family Medicine

## 2019-04-26 DIAGNOSIS — I209 Angina pectoris, unspecified: Secondary | ICD-10-CM | POA: Diagnosis not present

## 2019-04-26 DIAGNOSIS — K219 Gastro-esophageal reflux disease without esophagitis: Secondary | ICD-10-CM

## 2019-04-26 MED ORDER — FAMOTIDINE 20 MG PO TABS: 20.0000 mg | ORAL_TABLET | Freq: Every day | ORAL | 0 refills | Status: DC

## 2019-04-26 NOTE — Progress Notes (Signed)
Name: Felicia Poole   MRN: [id number]    DOB: 1945/10/23   Date:04/26/2019       Progress Note  Subjective  Chief Complaint  Chief Complaint  Patient presents with  . Heartburn    x 2 weeks intermittently. Needs medication called in to pharamcy.    I connected with  [name] on 04/26/19 at 11:20 AM EST by telephone and verified that I am speaking with the correct person using two identifiers.  I discussed the limitations, risks, security and privacy concerns of performing an evaluation and management service by telephone and the availability of in person appointments. Staff also discussed with the patient that there may be a patient responsible charge related to this service. Patient Location: at home  Provider Location: [place]  Additional Individuals present: [name] - her daughter   HPI  GERD: she states she noticed heartburn last couple of weeks has been more frequent, but not daily,  daughter did not feel comfortable giving her any otc medication. She states symptoms are throughout the day, not associated with activity, no diaphoresis or associated SOB. No nausea or vomiting, but has been spitting up some clear liquid sometimes . No blood in stools or change in bowel movements  Patient Active Problem List   Diagnosis Date Noted  . Angina pectoris ([place]) 04/11/2017  . Chronic anticoagulation 04/29/2015  . Obesity 12/10/2014  . Hypercholesterolemia 10/15/2013  . Encounter for therapeutic drug monitoring 08/08/2013  . Pacemaker -Medtronic 05/24/2013  . Nonischemic cardiomyopathy ([place])   . Atrial fibrillation ([place]) 02/28/2013  . Arteriosclerosis of coronary artery 02/22/2013  . Adult hypothyroidism 02/22/2013  . Congestive heart failure with left ventricular systolic dysfunction ([place]) 02/22/2013  . 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 Essential hypertension, benign   . Atrial myxoma   . Coronary atherosclerosis     Past Surgical History:   Procedure Laterality Date  . ATRIAL MYXOMA EXCISION Left   . CARDIAC CATHETERIZATION  2012   Unc; right and left heart 75% ostial LAD lesion  . CORONARY ARTERY BYPASS GRAFT  2011   [facility]  . INSERT / REPLACE / REMOVE PACEMAKER  02/2013   Dual chamber MDT advisa pacemaker. [id number]  . VAGINAL HYSTERECTOMY      Family History  Problem Relation Age of Onset  . Heart failure Mother   . Cirrhosis Mother   . Hyperlipidemia Father   . Hypertension Father   . Stroke Father   . Heart attack Father     Social History   Socioeconomic History  . Marital status: Widowed    Spouse name: Gwenlyn Perking  . Number of children: 5  . Years of education: Not on file  . Highest education level: 9th grade  Occupational History    Employer: DISABLED  Social Needs  . Financial resource strain: Not hard at all  . Food insecurity    Worry: Never true    Inability: Never true  . Transportation needs    Medical: No    Non-medical: No  Tobacco Use  . Smoking status: Never Smoker  . Smokeless tobacco: Never Used  . Tobacco comment: smoking cessation materials not required  Substance and Sexual Activity  . Alcohol use: No  . Drug use: No  . Sexual activity: Never  Lifestyle  . Physical activity    Days per week: 0 days    Minutes per session: 0 min  . Stress: Not at all  Relationships  . Social connections    Talks  on phone: Patient refused    Gets together: Patient refused    Attends religious service: Patient refused    Active member of club or organization: Patient refused    Attends meetings of clubs or organizations: Patient refused    Relationship status: Widowed  . Intimate partner violence    Fear of current or ex partner: No    Emotionally abused: No    Physically abused: No    Forced sexual activity: No  Other Topics Concern  . Not on file  Social History Narrative  . Not on file     Current Outpatient Medications:  .  alendronate (FOSAMAX) 70 MG tablet, Take 1 tablet (70 mg  total) by mouth once a week. With full glass of water and do not recline, Disp: 12 tablet, Rfl: 4 .  amLODipine (NORVASC) 10 MG tablet, Take 1 tablet (10 mg total) by mouth daily., Disp: 90 tablet, Rfl: 3 .  atorvastatin (LIPITOR) 80 MG tablet, Take 1 tablet (80 mg total) by mouth daily., Disp: 90 tablet, Rfl: 3 .  chlorthalidone (HYGROTON) 25 MG tablet, Take 1 tablet (25 mg total) by mouth daily., Disp: 90 tablet, Rfl: 3 .  fluticasone (FLONASE) 50 MCG/ACT nasal spray, 2 sprays q nostril qhs, Disp: 48 g, Rfl: 1 .  furosemide (LASIX) 40 MG tablet, Take 1 tablet (40 mg total) by mouth as needed., Disp: 30 tablet, Rfl: 3 .  Gauze Pads & Dressings (ABDOMINAL PAD) [id number]" PADS, 1 application by Does not apply route 4 (four) times daily., Disp: 360 each, Rfl: 1 .  Incontinence Supply Disposable ([employer] BLADDER CONTROL PAD) MISC, 1 each by Does not apply route daily., Disp: 100 each, Rfl: 1 .  levothyroxine (SYNTHROID) 88 MCG tablet, Take 1 tablet (88 mcg total) by mouth daily., Disp: 90 tablet, Rfl: 0 .  lisinopril (PRINIVIL,ZESTRIL) 20 MG tablet, TAKE 1 TABLET (20 MG TOTAL) BY MOUTH DAILY., Disp: 90 tablet, Rfl: 3 .  nystatin (MYCOSTATIN/NYSTOP) powder, Apply topically 2 (two) times daily., Disp: 45 g, Rfl: 0 .  OS-CAL CALCIUM + D3 500-200 MG-UNIT TABS, TAKE 1 TABLET BY MOUTH EVERY DAY, Disp: 90 tablet, Rfl: 0 .  sertraline (ZOLOFT) 50 MG tablet, Take 1 tablet (50 mg total) by mouth daily., Disp: 30 tablet, Rfl: 5 .  sotalol (BETAPACE) 80 MG tablet, Take 1 tablet (80 mg total) by mouth 2 (two) times daily., Disp: 180 tablet, Rfl: 3 .  Vitamin D, Ergocalciferol, (DRISDOL) 1.25 MG (50000 UT) CAPS capsule, Take 1 capsule (50,000 Units total) by mouth every 7 (seven) days., Disp: 12 capsule, Rfl: 3 .  warfarin (COUMADIN) 2.5 MG tablet, TAKE AS DIRECTED BY THE COUMADIN CLINIC., Disp: 30 tablet, Rfl: 1 .  famotidine (PEPCID) 20 MG tablet, Take 1 tablet (20 mg total) by mouth daily., Disp: 90 tablet, Rfl: 0  No  Known Allergies  I personally reviewed active problem list, medication list, allergies, family history with the patient/caregiver today.   ROS  Ten systems reviewed and is negative except as mentioned in HPI   Objective  Virtual encounter, vitals not obtained.  There is no height or weight on file to calculate BMI.  Physical Exam  Awake, alert and oriented, cooperative   PHQ2/9: Depression screen [facility] 2/9 04/26/2019 03/16/2019 02/05/2019 01/26/2019 01/24/2018  Decreased Interest 0 1 2 0 0  Down, Depressed, Hopeless 0 0 2 3 0  PHQ - 2 Score 0 1 4 3  0  Altered sleeping 0 0 3 3 0  Tired,  decreased energy 0 1 1 0 0  Change in appetite 0 0 0 0 0  Feeling bad or failure about yourself  0 0 1 1 0  Trouble concentrating 0 1 0 0 0  Moving slowly or fidgety/restless 0 1 3 1  0  Suicidal thoughts 0 0 0 0 0  PHQ-9 Score 0 4 12 8  0  Difficult doing work/chores - Somewhat difficult Not difficult at all Not difficult at all Not difficult at all   PHQ-2/9 Result is negative.    Fall Risk: Fall Risk  04/26/2019 02/05/2019 01/26/2019 01/24/2018 10/11/2017  Falls in the past year? 0 1 1 Yes No  Comment - - - fell getting out of church bus -  Number falls in past yr: 0 1 1 1  -  Injury with Fall? 0 1 1 No -  Risk for fall due to : - - - Impaired vision;Medication side effect -  Risk for fall due to: Comment - - - wears eyeglasses -  Follow up - - - Falls evaluation completed;Education provided;Falls prevention discussed -     Assessment & Plan  1. GERD without esophagitis  Explained interaction with coumadin, explained tums may be safer Last INR was very low at 1.0, advised to notify Dr. Rockey Situ on next INR check  - famotidine (PEPCID) 20 MG tablet; Take 1 tablet (20 mg total) by mouth daily.  Dispense: 90 tablet; Refill: 0  I discussed the assessment and treatment plan with the patient. The patient was provided an opportunity to ask questions and all were answered. The patient agreed with the  plan and demonstrated an understanding of the instructions.   The patient was advised to call back or seek an in-person evaluation if the symptoms worsen or if the condition fails to improve as anticipated.  I provided 15  minutes of non-face-to-face time during this encounter.  Loistine Chance, MD

## 2019-04-30 ENCOUNTER — Ambulatory Visit: Payer: Medicare Other | Admitting: Cardiovascular Disease

## 2019-05-16 ENCOUNTER — Other Ambulatory Visit: Payer: Self-pay

## 2019-05-16 ENCOUNTER — Ambulatory Visit (INDEPENDENT_AMBULATORY_CARE_PROVIDER_SITE_OTHER): Payer: Medicare Other

## 2019-05-16 DIAGNOSIS — I4891 Unspecified atrial fibrillation: Secondary | ICD-10-CM

## 2019-05-16 DIAGNOSIS — Z5181 Encounter for therapeutic drug level monitoring: Secondary | ICD-10-CM

## 2019-05-16 LAB — POCT INR: INR: 1.1 — AB (ref 2.0–3.0)

## 2019-05-16 NOTE — Patient Instructions (Signed)
Take 2 tablets of warfarin today, then START NEW DOSAGE of warfarin 1 (2.5 mg) tablet every day.  Recheck in 1 week at your appointment with Dr. Rockey Situ.

## 2019-05-22 NOTE — Progress Notes (Deleted)
Virtual Visit via Video Note   This visit type was conducted due to national recommendations for restrictions regarding the COVID-19 Pandemic (e.g. social distancing) in an effort to limit this patient's exposure and mitigate transmission in our community.  Due to her co-morbid illnesses, this patient is at least at moderate risk for complications without adequate follow up.  This format is felt to be most appropriate for this patient at this time.  All issues noted in this document were discussed and addressed.  A limited physical exam was performed with this format.  Please refer to the patient's chart for her consent to telehealth for [facility].   Date:  05/22/2019   ID:  Felicia Poole, DOB 1946/05/22, MRN [id number]  Patient Location:  [address] [place] [address]   Provider location:   [facility], [place] office  PCP:  Steele Sizer, MD  Cardiologist:  Arvid Right [facility]  Chief Complaint:  Leg edema, Tired  Previously seen by EP, Dr. Caryl Comes  New patient to the clinic  History of Present Illness:    Felicia Poole is a 73 y.o. female who presents via Engineer, civil (consulting) for a telehealth visit today.   The patient does not symptoms concerning for COVID-19 infection (fever, chills, cough, or new SHORTNESS OF BREATH).   Patient has a past medical history of pacemaker [employer]   atrial fibrillation  post termination pauses.  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  nonischemic cardiomyopathy with ejection fraction 45%  Medication noncompliance Who presents to establish care in the [place] office, history of coronary disease prior bypass pacer  Daughter on the evisit with her today Patient was not taking her medications for many months Appeared that back in May 2019 thyroid number was off cholesterol number was high Perhaps was not taking  medications back then Reports that beginning of the year not feeling as well had some leg swelling At the encouragement of her daughter she restarted her medications approximately 2 months ago Breathing ok, leg swelling improved  Currently with no edema, Edema seems to resolve with Lasix as needed Takes lasix once a week Takes chlorthalidone daily  No chest pain No PND orthopnea  Needs pacer check, last in 06/2017 No recent office visits with Dr. Caryl Comes  Last set of full lab work May 2019 She does come in for INR check with our office  Very sedentary, no regular exercise program  Prior CV studies:   The following studies were reviewed today:  Echocardiogram August 2014 ejection fraction 45 to 50%  Other past medical history reviewed nuclear test March 2014 by Dr. Clayborn Bigness that apparently showed no ischemia and normal left ventricular function.   DATE TEST    8/14 Echo    EF 45-50 %         Husband passed away last summer.  They have been married 50+ years.   Past Medical History:  Diagnosis Date  . Atrial myxoma    Status post resection in 2012 at [facility]  . Benign neoplasm of heart   . Contact dermatitis and other eczema, due to unspecified cause   . Contusion of unspecified site   . Coronary atherosclerosis of unspecified type of vessel, native or graft    Status post CABG in March of 2012 at the time of atrial myxoma resection with LIMA to LAD done at [facility]  . Cramp of  limb   . Essential hypertension, benign   . Heartburn   . Lumbago   . Nonischemic cardiomyopathy ([place])    EF: 45% in 2012  . Nonischemic cardiomyopathy ([place])   . Osteoporosis, unspecified   . Other and unspecified hyperlipidemia   . Other specified cardiac dysrhythmias(427.89)   . Postsurgical percutaneous transluminal coronary angioplasty status   . Type II or unspecified type diabetes mellitus without mention of complication, not stated as uncontrolled   . Unspecified hypothyroidism   .  Unspecified menopausal and postmenopausal disorder    Past Surgical History:  Procedure Laterality Date  . ATRIAL MYXOMA EXCISION Left   . CARDIAC CATHETERIZATION  2012   [facility]; right and left heart 75% ostial LAD lesion  . CORONARY ARTERY BYPASS GRAFT  2011   [facility]  . INSERT / REPLACE / REMOVE PACEMAKER  02/2013   Dual chamber MDT advisa pacemaker. [id number]  . VAGINAL HYSTERECTOMY       No outpatient medications have been marked as taking for the 05/23/19 encounter (Appointment) with Minna Merritts, MD.     Allergies:   Patient has no known allergies.   Social History   Tobacco Use  . Smoking status: Never Smoker  . Smokeless tobacco: Never Used  . Tobacco comment: smoking cessation materials not required  Substance Use Topics  . Alcohol use: No  . Drug use: No     Current Outpatient Medications on File Prior to Visit  Medication Sig Dispense Refill  . alendronate (FOSAMAX) 70 MG tablet Take 1 tablet (70 mg total) by mouth once a week. With full glass of water and do not recline 12 tablet 4  . amLODipine (NORVASC) 10 MG tablet Take 1 tablet (10 mg total) by mouth daily. 90 tablet 3  . atorvastatin (LIPITOR) 80 MG tablet Take 1 tablet (80 mg total) by mouth daily. 90 tablet 3  . chlorthalidone (HYGROTON) 25 MG tablet Take 1 tablet (25 mg total) by mouth daily. 90 tablet 3  . famotidine (PEPCID) 20 MG tablet Take 1 tablet (20 mg total) by mouth daily. 90 tablet 0  . fluticasone (FLONASE) 50 MCG/ACT nasal spray 2 sprays q nostril qhs 48 g 1  . furosemide (LASIX) 40 MG tablet Take 1 tablet (40 mg total) by mouth as needed. 30 tablet 3  . Gauze Pads & Dressings (ABDOMINAL PAD) [id number]" PADS 1 application by Does not apply route 4 (four) times daily. 360 each 1  . Incontinence Supply Disposable ([employer] BLADDER CONTROL PAD) MISC 1 each by Does not apply route daily. 100 each 1  . levothyroxine (SYNTHROID) 88 MCG tablet Take 1 tablet (88 mcg total) by mouth daily. 90 tablet 0  .  lisinopril (PRINIVIL,ZESTRIL) 20 MG tablet TAKE 1 TABLET (20 MG TOTAL) BY MOUTH DAILY. 90 tablet 3  . nystatin (MYCOSTATIN/NYSTOP) powder Apply topically 2 (two) times daily. 45 g 0  . OS-CAL CALCIUM + D3 500-200 MG-UNIT TABS TAKE 1 TABLET BY MOUTH EVERY DAY 90 tablet 0  . sertraline (ZOLOFT) 50 MG tablet Take 1 tablet (50 mg total) by mouth daily. 30 tablet 5  . sotalol (BETAPACE) 80 MG tablet Take 1 tablet (80 mg total) by mouth 2 (two) times daily. 180 tablet 3  . Vitamin D, Ergocalciferol, (DRISDOL) 1.25 MG (50000 UT) CAPS capsule Take 1 capsule (50,000 Units total) by mouth every 7 (seven) days. 12 capsule 3  . warfarin (COUMADIN) 2.5 MG tablet TAKE AS DIRECTED BY THE COUMADIN CLINIC. 30 tablet 1  No current facility-administered medications on file prior to visit.     Family Hx: The patient's family history includes Cirrhosis in her mother; Heart attack in her father; Heart failure in her mother; Hyperlipidemia in her father; Hypertension in her father; Stroke in her father.  ROS:   Please see the history of present illness.    Review of Systems  Constitutional: Negative.   Respiratory: Negative.   Cardiovascular: Negative.   Gastrointestinal: Negative.   Musculoskeletal: Negative.   Neurological: Negative.   Psychiatric/Behavioral: Negative.   All other systems reviewed and are negative.    Labs/Other Tests and Data Reviewed:    Recent Labs: 10/27/2018: Hemoglobin 13.0; Platelets 150 02/05/2019: ALT 23; BUN 16; Creat 1.02; Potassium 4.4; Sodium 141 03/16/2019: TSH 1.05   Recent Lipid Panel Lab Results  Component Value Date/Time   CHOL 115 02/05/2019 12:00 AM   CHOL 228 (H) 04/29/2015 12:10 PM   TRIG 124 02/05/2019 12:00 AM   HDL 43 (L) 02/05/2019 12:00 AM   HDL 46 04/29/2015 12:10 PM   CHOLHDL 2.7 02/05/2019 12:00 AM   LDLCALC 51 02/05/2019 12:00 AM    Wt Readings from Last 3 Encounters:  03/16/19 127 lb (57.6 kg)  02/05/19 131 lb 9.6 oz (59.7 kg)  01/26/19 134  lb (60.8 kg)     Exam:    Vital Signs: Vital signs may also be detailed in the HPI There were no vitals taken for this visit.  Wt Readings from Last 3 Encounters:  03/16/19 127 lb (57.6 kg)  02/05/19 131 lb 9.6 oz (59.7 kg)  01/26/19 134 lb (60.8 kg)   Temp Readings from Last 3 Encounters:  03/16/19 (!) 97.1 F (36.2 C) (Temporal)  02/05/19 (!) 97.4 F (36.3 C) (Temporal)  01/26/19 (!) 97.5 F (36.4 C) (Temporal)   BP Readings from Last 3 Encounters:  03/16/19 124/76  02/05/19 124/72  01/26/19 140/88   Pulse Readings from Last 3 Encounters:  03/16/19 98  02/05/19 83  01/26/19 70    Blood pressure 120/60, pulse 70, weight stable 150 pounds  Well nourished, well developed female in no acute distress. Constitutional:  oriented to person, place, and time. No distress.     ASSESSMENT & PLAN:    Sinus node dysfunction ([place]) Has pacemaker, followed by EP We will help facilitate appointment with Dr. Caryl Comes Last appointment January 2019  Atrial fibrillation, unspecified type [facility]) Denies any tachycardia or palpitations, no shortness of breath We will renew her medications Often going for months off her medications, restarted at the encouragement of her daughter  Cardiac pacemaker in situ As above we will set up follow-up with Dr. Caryl Comes  Atrial myxoma Prior surgery 2012 In follow-up may need to repeat echocardiogram  Essential hypertension, benign Blood pressure is well controlled on today's visit. No changes made to the medications.  Coronary artery disease of native artery of native heart with stable angina pectoris ([place]) Currently with no symptoms of angina. No further workup at this time. Continue current medication regimen. Stressed importance of staying on her cholesterol medication  Essential hypertension No medication changes made She will continue to take Lasix as needed  Hx of CABG Prior surgical details discussed with her Myxoma resection, bypass  graft    COVID-19 Education: The signs and symptoms of COVID-19 were discussed with the patient and how to seek care for testing (follow up with PCP or arrange E-visit).  The importance of social distancing was discussed today.  Patient Risk:   After  full review of this patients clinical status, I feel that they are at least moderate risk at this time.  Time:   Today, I have spent 45 minutes with the patient with telehealth technology discussing the cardiac and medical problems/diagnoses detailed above     Medication Adjustments/Labs and Tests Ordered: Current medicines are reviewed at length with the patient today.  Concerns regarding medicines are outlined above.   Tests Ordered: No tests ordered   Medication Changes: No changes made   Disposition: Follow-up in 6 months Alternate with Dr. Caryl Comes every  6 months   Signed, Ida Rogue, MD  05/22/2019 2:02 PM    [place] Office [address] [place] [address], [place], [place] [address]

## 2019-05-23 ENCOUNTER — Ambulatory Visit (INDEPENDENT_AMBULATORY_CARE_PROVIDER_SITE_OTHER): Payer: Medicare Other

## 2019-05-23 ENCOUNTER — Ambulatory Visit (INDEPENDENT_AMBULATORY_CARE_PROVIDER_SITE_OTHER): Payer: Medicare Other | Admitting: Nurse Practitioner

## 2019-05-23 ENCOUNTER — Encounter: Payer: Self-pay | Admitting: Nurse Practitioner

## 2019-05-23 ENCOUNTER — Ambulatory Visit: Payer: Medicare Other | Admitting: Cardiovascular Disease

## 2019-05-23 ENCOUNTER — Other Ambulatory Visit: Payer: Self-pay

## 2019-05-23 VITALS — BP 130/80 | HR 76 | Temp 97.2°F | Ht 59.0 in | Wt 122.8 lb

## 2019-05-23 DIAGNOSIS — I10 Essential (primary) hypertension: Secondary | ICD-10-CM

## 2019-05-23 DIAGNOSIS — R011 Cardiac murmur, unspecified: Secondary | ICD-10-CM

## 2019-05-23 DIAGNOSIS — I428 Other cardiomyopathies: Secondary | ICD-10-CM

## 2019-05-23 DIAGNOSIS — Z5181 Encounter for therapeutic drug level monitoring: Secondary | ICD-10-CM | POA: Diagnosis not present

## 2019-05-23 DIAGNOSIS — I2510 Atherosclerotic heart disease of native coronary artery without angina pectoris: Secondary | ICD-10-CM | POA: Diagnosis not present

## 2019-05-23 DIAGNOSIS — I495 Sick sinus syndrome: Secondary | ICD-10-CM

## 2019-05-23 DIAGNOSIS — I209 Angina pectoris, unspecified: Secondary | ICD-10-CM

## 2019-05-23 DIAGNOSIS — I4891 Unspecified atrial fibrillation: Secondary | ICD-10-CM

## 2019-05-23 DIAGNOSIS — I480 Paroxysmal atrial fibrillation: Secondary | ICD-10-CM | POA: Diagnosis not present

## 2019-05-23 LAB — POCT INR: INR: 1.6 — AB (ref 2.0–3.0)

## 2019-05-23 NOTE — Patient Instructions (Signed)
Take 2 tablets of warfarin today, then continue dosage of warfarin 1 (2.5 mg) tablet every day.  Recheck in 3 weeks.

## 2019-05-23 NOTE — Patient Instructions (Signed)
Medication Instructions:  Your physician recommends that you continue on your current medications as directed. Please refer to the Current Medication list given to you today.  *If you need a refill on your cardiac medications before your next appointment, please call your pharmacy*  Lab Work: None ordered  If you have labs (blood work) drawn today and your tests are completely normal, you will receive your results only by: Marland Kitchen MyChart Message (if you have MyChart) OR . A paper copy in the mail If you have any lab test that is abnormal or we need to change your treatment, we will call you to review the results.  Testing/Procedures: 1- Echo  Please return to [facility] on ______________ at _______________ AM/PM for an Echocardiogram. Your physician has requested that you have an echocardiogram. Echocardiography is a painless test that uses sound waves to create images of your heart. It provides your doctor with information about the size and shape of your heart and how well your heart's chambers and valves are working. This procedure takes approximately one hour. There are no restrictions for this procedure. Please note; depending on visual quality an IV may need to be placed.    Follow-Up: At [facility], you and your health needs are our priority.  As part of our continuing mission to provide you with exceptional heart care, we have created designated Provider Care Teams.  These Care Teams include your primary Cardiologist (physician) and Advanced Practice Providers (APPs -  Physician Assistants and Nurse Practitioners) who all work together to provide you with the care you need, when you need it.  Your next appointment:   6 month(s)  The format for your next appointment:   In Person or virtual  Provider:    Please see Virl Axe, MD

## 2019-05-23 NOTE — Progress Notes (Signed)
Office Visit    Patient Name: Felicia Poole Date of Encounter: 05/23/2019  Primary Care Provider:  Steele Sizer, MD Primary Cardiologist:  Virl Axe, MD  Chief Complaint    73 year old female with a history of CAD status post one-vessel bypass, atrial myxoma status post resection (2012), nonischemic cardiomyopathy with an EF of 86 to 50%, paroxysmal atrial fibrillation on warfarin and sotalol, tachybradycardia syndrome status post pacemaker, hyperlipidemia, hypertension, and type 2 diabetes mellitus, who presents for follow-up of CAD.  Past Medical History    Past Medical History:  Diagnosis Date  . Atrial myxoma    a. 2012 s/p resection at [facility].  . Benign neoplasm of heart   . Contact dermatitis and other eczema, due to unspecified cause   . Contusion of unspecified site   . Coronary atherosclerosis of unspecified type of vessel, native or graft    a. 08/2010 s/p CABG x 1 (LIMA->LAD @ time of atrial myxoma resection ([facility]); b. 01/2013 MV: No ischemia/infarct. EF 71%.  . Cramp of limb   . Essential hypertension, benign   . Heartburn   . Hyperlipidemia LDL goal <70   . Lumbago   . Nonischemic cardiomyopathy ([place])    a. EF: 45% in 2012; b. 01/2013 Echo: EF 45-50%, mild diff HK. Mild to mod MR. Mild BAE. Mod TR.  . Osteoporosis, unspecified   . Other specified cardiac dysrhythmias(427.89)   . PAF (paroxysmal atrial fibrillation) ([facility])    a. CHA2DS2VASc = 6-->warfarin.  Rhythm management with sotalol.  Marland Kitchen Postsurgical percutaneous transluminal coronary angioplasty status   . Tachy-brady syndrome ([place])    a. 02/2013 s/p MDT [id number] Advisa DR MRI DC PPM.  . Type II or unspecified type diabetes mellitus without mention of complication, not stated as uncontrolled   . Unspecified hypothyroidism   . Unspecified menopausal and postmenopausal disorder    Past Surgical History:  Procedure Laterality Date  . ATRIAL MYXOMA EXCISION Left   . CARDIAC CATHETERIZATION  2012   [facility];  right and left heart 75% ostial LAD lesion  . CORONARY ARTERY BYPASS GRAFT  2011   [facility]  . INSERT / REPLACE / REMOVE PACEMAKER  02/2013   Dual chamber MDT advisa pacemaker. [id number]  . VAGINAL HYSTERECTOMY      Allergies  No Known Allergies  History of Present Illness    73 year old female with a history of CAD status post one-vessel bypass at the time of atrial myxoma resection in 2012, nonischemic cardiomyopathy with an EF of 45 to 50%, paroxysmal atrial fibrillation on warfarin and sotalol, tachybradycardia syndrome status post permanent pacemaker, hypertension, hyperlipidemia, and type 2 diabetes mellitus.  She last underwent ischemic evaluation in August 2014 with nonischemic stress testing performed at [facility].  Echocardiogram performed at that time showed an EF of 45 to 50% with mild diffuse hypokinesis, mild to moderate mitral regurgitation, and moderate tricuspid regurgitation.  In the setting of tachybradycardia syndrome, she underwent permanent pacemaker placement at [facility] in September 2014.  She has been followed closely here in our anticoagulation clinic and last saw Dr. Caryl Comes via telemedicine visit in May 2020.  Since her last telemedicine visit, she has been doing well.  She is present with her daughter today.  Patient remains active around the house, frequently cleaning without any symptoms or limitations.  She sometimes drives to [employer] to walk around and say hello to some friends.  She has been careful about wearing a mask and appropriately socially distancing.  She denies chest  pain, dyspnea, palpitations, PND, orthopnea, dizziness, syncope, edema, or early satiety.  Home Medications    Prior to Admission medications   Medication Sig Start Date End Date Taking? Authorizing Provider  alendronate (FOSAMAX) 70 MG tablet Take 1 tablet (70 mg total) by mouth once a week. With full glass of water and do not recline 02/05/19   Steele Sizer, MD  amLODipine (NORVASC) 10 MG  tablet Take 1 tablet (10 mg total) by mouth daily. 09/19/18   Minna Merritts, MD  atorvastatin (LIPITOR) 80 MG tablet Take 1 tablet (80 mg total) by mouth daily. 09/19/18   Minna Merritts, MD  chlorthalidone (HYGROTON) 25 MG tablet Take 1 tablet (25 mg total) by mouth daily. 09/19/18   Minna Merritts, MD  famotidine (PEPCID) 20 MG tablet Take 1 tablet (20 mg total) by mouth daily. 04/26/19   Steele Sizer, MD  fluticasone Asencion Islam) 50 MCG/ACT nasal spray 2 sprays q nostril qhs 02/05/19   Sowles, Drue Stager, MD  furosemide (LASIX) 40 MG tablet Take 1 tablet (40 mg total) by mouth as needed. 09/19/18   Minna Merritts, MD  Gauze Pads & Dressings (ABDOMINAL PAD) [id number]" PADS 1 application by Does not apply route 4 (four) times daily. 02/05/19   Steele Sizer, MD  Incontinence Supply Disposable ([employer] BLADDER CONTROL PAD) MISC 1 each by Does not apply route daily. 02/05/19   Steele Sizer, MD  levothyroxine (SYNTHROID) 88 MCG tablet Take 1 tablet (88 mcg total) by mouth daily. 01/26/19   Delsa Grana, PA-C  lisinopril (PRINIVIL,ZESTRIL) 20 MG tablet TAKE 1 TABLET (20 MG TOTAL) BY MOUTH DAILY. 09/19/18   Minna Merritts, MD  nystatin (MYCOSTATIN/NYSTOP) powder Apply topically 2 (two) times daily. 05/03/17   Hubbard Hartshorn, FNP  OS-CAL CALCIUM + D3 500-200 MG-UNIT TABS TAKE 1 TABLET BY MOUTH EVERY DAY 01/25/19   Poulose, Bethel Born, NP  sertraline (ZOLOFT) 50 MG tablet Take 1 tablet (50 mg total) by mouth daily. 03/16/19   Steele Sizer, MD  sotalol (BETAPACE) 80 MG tablet Take 1 tablet (80 mg total) by mouth 2 (two) times daily. 09/19/18   Minna Merritts, MD  Vitamin D, Ergocalciferol, (DRISDOL) 1.25 MG (50000 UT) CAPS capsule Take 1 capsule (50,000 Units total) by mouth every 7 (seven) days. 02/05/19   Steele Sizer, MD  warfarin (COUMADIN) 2.5 MG tablet TAKE AS DIRECTED BY THE COUMADIN CLINIC. 04/11/19   Minna Merritts, MD    Review of Systems    She denies chest pain, palpitations,  dyspnea, pnd, orthopnea, n, v, dizziness, syncope, edema, weight gain, or early satiety.  Some chronic low back pain.  All other systems reviewed and are otherwise negative except as noted above.  Physical Exam    VS:  BP 130/80 (BP Location: Left Arm, Patient Position: Sitting, Cuff Size: Normal)   Pulse 76   Temp (!) 97.2 F (36.2 C)   Ht 4\' 11"  (1.499 m)   Wt 122 lb 12 oz (55.7 kg)   SpO2 96%   BMI 24.79 kg/m  , BMI Body mass index is 24.79 kg/m. GEN: Well nourished, well developed, in no acute distress. HEENT: normal. Neck: Supple, no JVD, carotid bruits, or masses. Cardiac: RRR, 2/6 systolic ejection murmur loudest at the right upper sternal border with 2/6 systolic murmur at the left lower sternal border radiating to apex as well.  No rubs, or gallops. No clubbing, cyanosis, edema.  Radials/PT 2+ and equal bilaterally.  Respiratory:  Respirations regular and  unlabored, clear to auscultation bilaterally. GI: Soft, nontender, nondistended, BS + x 4. MS: no deformity or atrophy. Skin: warm and dry, no rash. Neuro:  Strength and sensation are intact. Psych: Normal affect.  Accessory Clinical Findings    ECG personally reviewed by me today -atrial paced, 76, nonspecific ST and T changes - no acute changes.  Lab Results  Component Value Date   WBC 4.3 10/27/2018   HGB 13.0 10/27/2018   HCT 40.3 10/27/2018   MCV 96.9 10/27/2018   PLT 150 10/27/2018   Lab Results  Component Value Date   CREATININE 1.02 (H) 02/05/2019   BUN 16 02/05/2019   NA 141 02/05/2019   K 4.4 02/05/2019   CL 105 02/05/2019   CO2 30 02/05/2019   Lab Results  Component Value Date   ALT 23 02/05/2019   AST 25 02/05/2019   ALKPHOS 96 10/26/2016   BILITOT 1.0 02/05/2019   Lab Results  Component Value Date   CHOL 115 02/05/2019   HDL 43 (L) 02/05/2019   LDLCALC 51 02/05/2019   TRIG 124 02/05/2019   CHOLHDL 2.7 02/05/2019    Lab Results  Component Value Date   HGBA1C 5.9 (H) 02/05/2019    Lab Results  Component Value Date   TSH 1.05 03/16/2019    Assessment & Plan    1.  Coronary artery disease: Status post one-vessel bypass with a LIMA to the LAD at the time of resection of atrial myxoma in 2012.  She remains active at home without chest pain or dyspnea.  ECG unchanged today.  She remains on statin therapy.  No aspirin in the setting of chronic warfarin.  2.  Paroxysmal atrial fibrillation: In sinus/atrial paced.  She remains on sotalol and warfarin.  3.  Tachy-brady syndrome: Status post dual-chamber permanent pacemaker in September 2014.  Followed by device clinic.  4.  Essential hypertension: Stable on calcium channel blocker, furosemide, and ACE inhibitor.  5.  Hyperlipidemia: LDL of 51 in August with normal LFTs.  She remains on atorvastatin therapy.  6.  Nonischemic cardiomyopathy: EF 45-50% by echo in 2014.  Euvolemic on examination without symptoms of dyspnea or orthopnea at home.  She remains on ACE inhibitor therapy.  She is on sotalol secondary to PAF.  7.  Systolic murmurs: Notable on examination.  Prior echo in 2014 showed mild to moderate mitral regurgitation and moderate tricuspid regurgitation.  Follow-up echocardiogram to assess for progression of valve disease, as it has been 6 years.  8.  Hypothyroidism: Normal TSH in October.  Remains on levothyroxine.  9.  Disposition: Follow-up echocardiogram.  Follow-up via virtual visit with Dr. Caryl Comes in 6 months or sooner if necessary.   Murray Hodgkins, NP 05/23/2019, 12:25 PM

## 2019-05-24 ENCOUNTER — Telehealth: Payer: Self-pay | Admitting: Nurse Practitioner

## 2019-06-22 ENCOUNTER — Ambulatory Visit: Payer: Medicare Other | Admitting: Family Medicine

## 2019-06-27 ENCOUNTER — Other Ambulatory Visit: Payer: Self-pay

## 2019-06-27 ENCOUNTER — Ambulatory Visit (INDEPENDENT_AMBULATORY_CARE_PROVIDER_SITE_OTHER): Payer: Medicare Other

## 2019-06-27 DIAGNOSIS — Z5181 Encounter for therapeutic drug level monitoring: Secondary | ICD-10-CM

## 2019-06-27 DIAGNOSIS — I4891 Unspecified atrial fibrillation: Secondary | ICD-10-CM

## 2019-06-27 LAB — POCT INR: INR: 2 (ref 2.0–3.0)

## 2019-06-27 NOTE — Patient Instructions (Signed)
Please continue dosage of warfarin 1 (2.5 mg) tablet every day.  Recheck in 4 weeks.

## 2019-07-15 ENCOUNTER — Other Ambulatory Visit: Payer: Self-pay | Admitting: Cardiovascular Disease

## 2019-07-16 NOTE — Telephone Encounter (Signed)
Refill Request.  

## 2019-08-01 ENCOUNTER — Other Ambulatory Visit: Payer: Self-pay

## 2019-08-01 ENCOUNTER — Ambulatory Visit (INDEPENDENT_AMBULATORY_CARE_PROVIDER_SITE_OTHER): Payer: Medicare Other

## 2019-08-01 DIAGNOSIS — Z5181 Encounter for therapeutic drug level monitoring: Secondary | ICD-10-CM | POA: Diagnosis not present

## 2019-08-01 DIAGNOSIS — I4891 Unspecified atrial fibrillation: Secondary | ICD-10-CM | POA: Diagnosis not present

## 2019-08-01 LAB — POCT INR: INR: 2 (ref 2.0–3.0)

## 2019-08-01 NOTE — Patient Instructions (Signed)
Please continue dosage of warfarin 1 (2.5 mg) tablet every day.  Recheck in 4 weeks.

## 2019-08-14 ENCOUNTER — Other Ambulatory Visit: Payer: Self-pay | Admitting: Cardiovascular Disease

## 2019-08-14 NOTE — Telephone Encounter (Signed)
Please review for refill, Thanks !  

## 2019-08-21 ENCOUNTER — Other Ambulatory Visit: Payer: Self-pay

## 2019-08-21 DIAGNOSIS — E039 Hypothyroidism, unspecified: Secondary | ICD-10-CM

## 2019-08-22 MED ORDER — LEVOTHYROXINE SODIUM 88 MCG PO TABS: 88.0000 ug | ORAL_TABLET | Freq: Every day | ORAL | 1 refills | Status: DC

## 2019-09-05 ENCOUNTER — Other Ambulatory Visit: Payer: Self-pay

## 2019-09-05 ENCOUNTER — Ambulatory Visit (INDEPENDENT_AMBULATORY_CARE_PROVIDER_SITE_OTHER): Payer: Medicare Other

## 2019-09-05 DIAGNOSIS — I4891 Unspecified atrial fibrillation: Secondary | ICD-10-CM

## 2019-09-05 DIAGNOSIS — Z5181 Encounter for therapeutic drug level monitoring: Secondary | ICD-10-CM

## 2019-09-05 LAB — POCT INR: INR: 1.9 — AB (ref 2.0–3.0)

## 2019-09-05 NOTE — Patient Instructions (Signed)
-   take 1.5 tablets tonight - tomorrow continue dosage of warfarin 1 (2.5 mg) tablet every day.  - recheck in 4 weeks

## 2019-09-07 ENCOUNTER — Other Ambulatory Visit: Payer: Self-pay

## 2019-09-07 ENCOUNTER — Ambulatory Visit (INDEPENDENT_AMBULATORY_CARE_PROVIDER_SITE_OTHER): Payer: Medicare Other

## 2019-09-07 DIAGNOSIS — R011 Cardiac murmur, unspecified: Secondary | ICD-10-CM

## 2019-09-10 ENCOUNTER — Telehealth: Payer: Self-pay

## 2019-09-10 NOTE — Telephone Encounter (Signed)
-----   Message from Theora Gianotti, NP sent at 09/10/2019  8:30 AM EDT ----- Heart squeezing function is reduced compared to 2014 echo - now 35-40% (was 45-50%).  The mitral valve remains mildly to moderately leaky, while the tricuspid valve, which was previously moderately leaky, is now severely leaky.  The pressure on the right side of the heart are elevated, and this likely accounts for the severe leakiness of the tricuspid valve.  She was doing well @ December visit.  She should f/u for w/ Dr. Caryl Comes w/in the next month for re-eval (or sooner if symptomatic) and adjustment of meds, if necessary.

## 2019-09-10 NOTE — Telephone Encounter (Signed)
Attempted to call patient. LMTCB 09/10/2019

## 2019-09-14 ENCOUNTER — Other Ambulatory Visit: Payer: Self-pay | Admitting: Cardiovascular Disease

## 2019-09-14 NOTE — Telephone Encounter (Signed)
Please review for refill, Thanks !  

## 2019-09-17 NOTE — Telephone Encounter (Signed)
Attempted to call patient. LMTCB 09/17/2019   

## 2019-09-21 NOTE — Telephone Encounter (Signed)
3rd attempt. Left voicemail. Letter sent.   Encounter completed.

## 2019-10-08 ENCOUNTER — Other Ambulatory Visit: Payer: Self-pay | Admitting: Cardiovascular Disease

## 2019-10-08 NOTE — Telephone Encounter (Signed)
Please review for refill, Thanks !  

## 2019-10-29 ENCOUNTER — Other Ambulatory Visit: Payer: Self-pay

## 2019-10-29 ENCOUNTER — Ambulatory Visit (INDEPENDENT_AMBULATORY_CARE_PROVIDER_SITE_OTHER): Payer: Medicare Other

## 2019-10-29 DIAGNOSIS — I4891 Unspecified atrial fibrillation: Secondary | ICD-10-CM | POA: Diagnosis not present

## 2019-10-29 DIAGNOSIS — Z5181 Encounter for therapeutic drug level monitoring: Secondary | ICD-10-CM

## 2019-10-29 LAB — POCT INR: INR: 1.9 — AB (ref 2.0–3.0)

## 2019-10-29 NOTE — Patient Instructions (Signed)
-   take 1.5 tablets tonight - tomorrow continue dosage of warfarin 1 (2.5 mg) tablet every day.  - recheck in 4 weeks

## 2019-10-31 ENCOUNTER — Other Ambulatory Visit: Payer: Self-pay

## 2019-10-31 MED ORDER — WARFARIN SODIUM 2.5 MG PO TABS: ORAL_TABLET | ORAL | 0 refills | Status: DC

## 2019-11-17 ENCOUNTER — Other Ambulatory Visit: Payer: Self-pay | Admitting: Cardiovascular Disease

## 2019-11-17 DIAGNOSIS — E7800 Pure hypercholesterolemia, unspecified: Secondary | ICD-10-CM

## 2019-11-18 ENCOUNTER — Other Ambulatory Visit: Payer: Self-pay | Admitting: Family Medicine

## 2019-11-18 DIAGNOSIS — J302 Other seasonal allergic rhinitis: Secondary | ICD-10-CM

## 2019-11-18 NOTE — Telephone Encounter (Signed)
Requested Prescriptions  Pending Prescriptions Disp Refills  . fluticasone (FLONASE) 50 MCG/ACT nasal spray [Pharmacy Med Name: FLUTICASONE PROP 50 MCG SPRAY] 48 mL 1    Sig: SPRAY 2 SPRAYS IN EACH NOSTRIL AT BEDTIME     Ear, Nose, and Throat: Nasal Preparations - Corticosteroids Passed - 11/18/2019  1:06 AM      Passed - Valid encounter within last 12 months    Recent Outpatient Visits          6 months ago GERD without esophagitis   [facility] Steele Sizer, MD   8 months ago Major depression, recurrent, chronic [facility])   [facility] Steele Sizer, MD   9 months ago Adult hypothyroidism   [facility] [place], Drue Stager, MD   9 months ago Closed fracture of distal end of right fibula, unspecified fracture morphology, initial encounter   [facility] Delsa Grana, PA-C   2 years ago Adult hypothyroidism   [place], NP

## 2019-11-26 ENCOUNTER — Other Ambulatory Visit: Payer: Self-pay

## 2019-11-26 ENCOUNTER — Ambulatory Visit (INDEPENDENT_AMBULATORY_CARE_PROVIDER_SITE_OTHER): Payer: Medicare Other

## 2019-11-26 DIAGNOSIS — Z5181 Encounter for therapeutic drug level monitoring: Secondary | ICD-10-CM | POA: Diagnosis not present

## 2019-11-26 DIAGNOSIS — I4891 Unspecified atrial fibrillation: Secondary | ICD-10-CM

## 2019-11-26 LAB — POCT INR: INR: 1.9 — AB (ref 2.0–3.0)

## 2019-11-26 NOTE — Patient Instructions (Signed)
-   START NEW DOSAGE of warfarin 1 (2.5 mg) tablet every day EXCEPT 1.5 tablets on [place]. - recheck in 4 weeks

## 2019-12-01 ENCOUNTER — Other Ambulatory Visit: Payer: Self-pay | Admitting: Internal Medicine

## 2019-12-31 ENCOUNTER — Other Ambulatory Visit: Payer: Self-pay | Admitting: Internal Medicine

## 2020-01-02 ENCOUNTER — Other Ambulatory Visit: Payer: Self-pay

## 2020-01-02 ENCOUNTER — Ambulatory Visit (INDEPENDENT_AMBULATORY_CARE_PROVIDER_SITE_OTHER): Payer: Medicare Other

## 2020-01-02 DIAGNOSIS — I4891 Unspecified atrial fibrillation: Secondary | ICD-10-CM

## 2020-01-02 DIAGNOSIS — Z5181 Encounter for therapeutic drug level monitoring: Secondary | ICD-10-CM

## 2020-01-02 LAB — POCT INR: INR: 2.1 (ref 2.0–3.0)

## 2020-01-02 NOTE — Patient Instructions (Signed)
-   continue dosage of warfarin 1 (2.5 mg) tablet every day EXCEPT 1.5 tablets on [place]. - recheck in 5 weeks

## 2020-01-15 ENCOUNTER — Encounter: Payer: Medicare Other | Admitting: Internal Medicine

## 2020-01-18 ENCOUNTER — Other Ambulatory Visit: Payer: Self-pay | Admitting: Internal Medicine

## 2020-02-04 ENCOUNTER — Other Ambulatory Visit: Payer: Self-pay | Admitting: Internal Medicine

## 2020-02-04 NOTE — Telephone Encounter (Signed)
This is a [place] pt 

## 2020-02-05 NOTE — Telephone Encounter (Signed)
This rx was refilled on 01/18/20 for 40 tablets (30 day supply), with 1 additional refill.  Pt should have enough Warfarin to get to appt tomorrow 02/06/20 at 11:45am.  Will refill at appt since pt has missed appts in the past.

## 2020-02-06 ENCOUNTER — Other Ambulatory Visit: Payer: Self-pay

## 2020-02-06 ENCOUNTER — Ambulatory Visit (INDEPENDENT_AMBULATORY_CARE_PROVIDER_SITE_OTHER): Payer: Medicare Other

## 2020-02-06 DIAGNOSIS — I4891 Unspecified atrial fibrillation: Secondary | ICD-10-CM | POA: Diagnosis not present

## 2020-02-06 DIAGNOSIS — Z5181 Encounter for therapeutic drug level monitoring: Secondary | ICD-10-CM | POA: Diagnosis not present

## 2020-02-06 LAB — POCT INR: INR: 2.2 (ref 2.0–3.0)

## 2020-02-06 NOTE — Patient Instructions (Signed)
-   continue dosage of warfarin 1 (2.5 mg) tablet every day EXCEPT 1.5 tablets on MONDAYS & FRIDAYS. - recheck in 6 weeks 

## 2020-02-14 ENCOUNTER — Other Ambulatory Visit: Payer: Self-pay | Admitting: Cardiovascular Disease

## 2020-02-14 DIAGNOSIS — E7800 Pure hypercholesterolemia, unspecified: Secondary | ICD-10-CM

## 2020-02-15 ENCOUNTER — Other Ambulatory Visit: Payer: Self-pay | Admitting: Family Medicine

## 2020-02-15 DIAGNOSIS — M8000XA Age-related osteoporosis with current pathological fracture, unspecified site, initial encounter for fracture: Secondary | ICD-10-CM

## 2020-02-29 ENCOUNTER — Other Ambulatory Visit: Payer: Self-pay | Admitting: Cardiovascular Disease

## 2020-02-29 NOTE — Telephone Encounter (Signed)
°*  STAT* If patient is at the pharmacy, call can be transferred to refill team.   1. Which medications need to be refilled? (please list name of each medication and dose if known) sotalol 80 mg bid  2. Which pharmacy/location (including street and city if local pharmacy) is medication to be sent to? [facility]  3. Do they need a 30 day or 90 day supply? 30 Patient is out of medication Patient has scheduled overdue appointment for 9/29

## 2020-03-04 ENCOUNTER — Ambulatory Visit (INDEPENDENT_AMBULATORY_CARE_PROVIDER_SITE_OTHER): Payer: Medicare Other | Admitting: Internal Medicine

## 2020-03-04 ENCOUNTER — Encounter: Payer: Self-pay | Admitting: Internal Medicine

## 2020-03-04 ENCOUNTER — Other Ambulatory Visit: Payer: Self-pay

## 2020-03-04 VITALS — BP 130/80 | HR 82 | Ht 59.0 in | Wt 131.5 lb

## 2020-03-04 DIAGNOSIS — I10 Essential (primary) hypertension: Secondary | ICD-10-CM | POA: Diagnosis not present

## 2020-03-04 DIAGNOSIS — Z950 Presence of cardiac pacemaker: Secondary | ICD-10-CM | POA: Diagnosis not present

## 2020-03-04 DIAGNOSIS — I495 Sick sinus syndrome: Secondary | ICD-10-CM | POA: Diagnosis not present

## 2020-03-04 DIAGNOSIS — I428 Other cardiomyopathies: Secondary | ICD-10-CM

## 2020-03-04 DIAGNOSIS — Z79899 Other long term (current) drug therapy: Secondary | ICD-10-CM

## 2020-03-04 DIAGNOSIS — I480 Paroxysmal atrial fibrillation: Secondary | ICD-10-CM | POA: Diagnosis not present

## 2020-03-04 NOTE — Patient Instructions (Signed)
Medication Instructions:  - Your physician recommends that you continue on your current medications as directed. Please refer to the Current Medication list given to you today.  *If you need a refill on your cardiac medications before your next appointment, please call your pharmacy*   Lab Work: - Your physician recommends that you have lab work today: BMP/ CBC/ TSH/ Magnesium  If you have labs (blood work) drawn today and your tests are completely normal, you will receive your results only by: Marland Kitchen MyChart Message (if you have MyChart) OR . A paper copy in the mail If you have any lab test that is abnormal or we need to change your treatment, we will call you to review the results.   Testing/Procedures: - none ordered   Follow-Up: At [facility], you and your health needs are our priority.  As part of our continuing mission to provide you with exceptional heart care, we have created designated Provider Care Teams.  These Care Teams include your primary Cardiologist (physician) and Advanced Practice Providers (APPs -  Physician Assistants and Nurse Practitioners) who all work together to provide you with the care you need, when you need it.  We recommend signing up for the patient portal called "MyChart".  Sign up information is provided on this After Visit Summary.  MyChart is used to connect with patients for Virtual Visits (Telemedicine).  Patients are able to view lab/test results, encounter notes, upcoming appointments, etc.  Non-urgent messages can be sent to your provider as well.   To learn more about what you can do with MyChart, go to [email or web address].    Your next appointment:   6 month(s)  The format for your next appointment:   In Person  Provider:   Virl Axe, MD   Other Instructions

## 2020-03-04 NOTE — Progress Notes (Deleted)
Cardiology Office Note    Date:  03/04/2020   ID:  Felicia Poole, DOB 05-07-1946, MRN [id number]  PCP:  Steele Sizer, MD  Cardiologist:  Virl Axe, MD  Electrophysiologist:  None   Chief Complaint: ***  History of Present Illness:   Felicia Poole is a 74 y.o. female with history of CAD status post one-vessel bypass in 08/2010 with LIMA to LAD, pulmonary hypertension, atrial myxoma status post resection in 2012 at time of bypass surgery, HFrEF secondary to NICM with an EF of 45 to 50%, PAF on warfarin and sotalol, tachybradycardia syndrome status post pacemaker, DM2, HTN, and HLD who presents for ***.  She last underwent ischemic evaluation in 01/2013 after nodal clinic via stress test which was nonischemic.  Echo at that time showed an EF of 45 to 50% with mild diffuse hypokinesis, mild to moderate mitral regurgitation, and moderate tricuspid regurgitation.  In the setting of tachybradycardia syndrome, she underwent PPM implantation [facility] in 02/2013.  She was last seen in the office in 05/2019 and was doing well from a cardiac perspective.  Subsequent echo in 09/2019 to evaluate her valvular heart disease and prior noted murmurs on exam demonstrated an EF of 35 to 40%, global hypokinesis, indeterminate diastolic function parameters, low normal RV systolic function with moderately enlarged RV cavity size, moderately elevated PASP at 50 mmHg, moderately dilated left atrium, mild to moderate mitral regurgitation, severe tricuspid regurgitation, and estimated right atrial pressure of 15 mmHg.  It was recommended she follow-up as previously scheduled with her primary cardiologist though has subsequently been lost to follow-up since her visit in 05/2019.  ***   Labs independently reviewed: 02/06/2020 - INR 2.2 03/2019 - TSH normal 01/2019 - BUN 16, serum creatinine 1.02, potassium 4.4, albumin 4.1, AST/ALT normal, A1c 5.9, TC 115, TG 124, HDL 43, LDL 51 10/2018 - Hgb 13.0, PLT 150  Past  Medical History:  Diagnosis Date  . Atrial myxoma    a. 2012 s/p resection at [facility].  . Benign neoplasm of heart   . Contact dermatitis and other eczema, due to unspecified cause   . Contusion of unspecified site   . Coronary atherosclerosis of unspecified type of vessel, native or graft    a. 08/2010 s/p CABG x 1 (LIMA->LAD @ time of atrial myxoma resection ([facility]); b. 01/2013 MV: No ischemia/infarct. EF 71%.  . Cramp of limb   . Essential hypertension, benign   . Heartburn   . Hyperlipidemia LDL goal <70   . Lumbago   . Nonischemic cardiomyopathy ([place])    a. EF: 45% in 2012; b. 01/2013 Echo: EF 45-50%, mild diff HK. Mild to mod MR. Mild BAE. Mod TR.  . Osteoporosis, unspecified   . Other specified cardiac dysrhythmias(427.89)   . PAF (paroxysmal atrial fibrillation) (HCC)    a. CHA2DS2VASc = 6-->warfarin.  Rhythm management with sotalol.  Marland Kitchen Postsurgical percutaneous transluminal coronary angioplasty status   . Tachy-brady syndrome ([place])    a. 02/2013 s/p MDT [id number] Advisa DR MRI DC PPM.  . Type II or unspecified type diabetes mellitus without mention of complication, not stated as uncontrolled   . Unspecified hypothyroidism   . Unspecified menopausal and postmenopausal disorder     Past Surgical History:  Procedure Laterality Date  . ATRIAL MYXOMA EXCISION Left   . CARDIAC CATHETERIZATION  2012   [facility]; right and left heart 75% ostial LAD lesion  . CORONARY ARTERY BYPASS GRAFT  2011   [facility]  . INSERT /  REPLACE / REMOVE PACEMAKER  02/2013   Dual chamber MDT advisa pacemaker. [id number]  . VAGINAL HYSTERECTOMY      Current Medications: No outpatient medications have been marked as taking for the 03/05/20 encounter (Appointment) with Rise Mu, PA-C.    Allergies:   Patient has no known allergies.   Social History   Socioeconomic History  . Marital status: Widowed    Spouse name: Gwenlyn Perking  . Number of children: 5  . Years of education: Not on file  . Highest education level: 9th  grade  Occupational History    Employer: DISABLED  Tobacco Use  . Smoking status: Never Smoker  . Smokeless tobacco: Never Used  . Tobacco comment: smoking cessation materials not required  Vaping Use  . Vaping Use: Never used  Substance and Sexual Activity  . Alcohol use: No  . Drug use: No  . Sexual activity: Never  Other Topics Concern  . Not on file  Social History Narrative  . Not on file   Social Determinants of Health   Financial Resource Strain:   . Difficulty of Paying Living Expenses: Not on file  Food Insecurity:   . Worried About Charity fundraiser in the Last Year: Not on file  . Ran Out of Food in the Last Year: Not on file  Transportation Needs:   . Lack of Transportation (Medical): Not on file  . Lack of Transportation (Non-Medical): Not on file  Physical Activity:   . Days of Exercise per Week: Not on file  . Minutes of Exercise per Session: Not on file  Stress:   . Feeling of Stress : Not on file  Social Connections: Unknown  . Frequency of Communication with Friends and Family: Not on file  . Frequency of Social Gatherings with Friends and Family: Not on file  . Attends Religious Services: Not on file  . Active Member of Clubs or Organizations: Not on file  . Attends Archivist Meetings: Not on file  . Marital Status: Widowed     Family History:  The patient's family history includes Cirrhosis in her mother; Heart attack in her father; Heart failure in her mother; Hyperlipidemia in her father; Hypertension in her father; Stroke in her father.  ROS:   ROS   EKGs/Labs/Other Studies Reviewed:    Studies reviewed were summarized above. The additional studies were reviewed today:  2D echo 09/2019: 1. Left ventricular ejection fraction, by estimation, is 35 to 40%. The  left ventricle has moderately decreased function. The left ventricle  demonstrates global hypokinesis. Left ventricular diastolic parameters are  indeterminate.  2.  Right ventricular systolic function is low normal. The right  ventricular size is moderately enlarged. There is moderately elevated  pulmonary artery systolic pressure. The estimated right ventricular  systolic pressure is 83.4 mmHg.  3. Left atrial size was mild to moderately dilated.  4. Mild to moderate mitral valve regurgitation.  5. Tricuspid valve regurgitation is severe.  6. The inferior vena cava is dilated in size with <50% respiratory  variability, suggesting right atrial pressure of 15 mmHg.   EKG:  EKG is ordered today.  The EKG ordered today demonstrates ***  Recent Labs: 03/16/2019: TSH 1.05  Recent Lipid Panel    Component Value Date/Time   CHOL 115 02/05/2019 0000   CHOL 228 (H) 04/29/2015 1210   TRIG 124 02/05/2019 0000   HDL 43 (L) 02/05/2019 0000   HDL 46 04/29/2015 1210   CHOLHDL 2.7 02/05/2019  0000   LDLCALC 51 02/05/2019 0000    PHYSICAL EXAM:    VS:  There were no vitals taken for this visit.  BMI: There is no height or weight on file to calculate BMI.  Physical Exam  Wt Readings from Last 3 Encounters:  05/23/19 122 lb 12 oz (55.7 kg)  03/16/19 127 lb (57.6 kg)  02/05/19 131 lb 9.6 oz (59.7 kg)     ASSESSMENT & PLAN:   1. ***  Disposition: F/u with Dr. Caryl Comes or an APP in ***.   Medication Adjustments/Labs and Tests Ordered: Current medicines are reviewed at length with the patient today.  Concerns regarding medicines are outlined above. Medication changes, Labs and Tests ordered today are summarized above and listed in the Patient Instructions accessible in Encounters.   Signed, Christell Faith, PA-C 03/04/2020 8:01 AM     [facility] [address] [place] [address] [place],  [address] [phone]

## 2020-03-04 NOTE — Progress Notes (Signed)
Patient Care Team: Steele Sizer, MD as PCP - General (Family Medicine) Deboraha Sprang, MD as PCP - Cardiology (Cardiology) Deboraha Sprang, MD as Consulting Physician (Cardiology) Wellington Hampshire, MD as Consulting Physician (Cardiology) Gardiner Barefoot, DPM as Consulting Physician (Podiatry)   HPI  Felicia Poole is a 74 y.o. female Seen for pacemaker Medtronic  followup for atrial fibrillation and significant post termination pauses. And sinus arrest w intrinsic conduction An event recorder  demonstrated pauses ;  she and her daughter chose to go to [facility] for pacemaker insertion and is followed in clinic   She has a history of a prior atrial myxoma status post resection with one-vessel CABG at [facility] in 2012. She is a history of a nonischemic cardiomyopathy with ejection fraction 45% and a nuclear test March 2014 by Dr. Clayborn Bigness that apparently showed no ischemia and normal left ventricular function.  The patient denies chest pain, nocturnal dyspnea, orthopnea or peripheral edema.  There have been no palpitations, lightheadedness or syncope.  Stable shortness of breath  No blood work, x INR s  DATE TEST    8/14 Echo    EF 45-50 %          Husband has been gone 5 yrs     Date Cr K  11/16 0.88 4.5       Thromboembolic risk factors ( age -26, HTN-1, DM-1, CHF-1, Gender-1) for a CHADSVASc Score of >=5    Past Medical History:  Diagnosis Date  . Atrial myxoma    a. 2012 s/p resection at [facility]  . Benign neoplasm of heart   . Contact dermatitis and other eczema, due to unspecified cause   . Contusion of unspecified site   . Coronary atherosclerosis of unspecified type of vessel, native or graft    a. 08/2010 s/p CABG x 1 (LIMA->LAD @ time of atrial myxoma resection ([facility]); b. 01/2013 MV: No ischemia/infarct. EF 71%.  . Cramp of limb   . Essential hypertension, benign   . Heartburn   . Hyperlipidemia LDL goal <70   . Lumbago   . Nonischemic cardiomyopathy ([place])    a.  EF: 45% in 2012; b. 01/2013 Echo: EF 45-50%, mild diff HK. Mild to mod MR. Mild BAE. Mod TR.  . Osteoporosis, unspecified   . Other specified cardiac dysrhythmias(427.89)   . PAF (paroxysmal atrial fibrillation) (HCC)    a. CHA2DS2VASc = 6-->warfarin.  Rhythm management with sotalol.  Marland Kitchen Postsurgical percutaneous transluminal coronary angioplasty status   . Tachy-brady syndrome ([place])    a. 02/2013 s/p MDT [id number] Advisa DR MRI DC PPM.  . Type II or unspecified type diabetes mellitus without mention of complication, not stated as uncontrolled   . Unspecified hypothyroidism   . Unspecified menopausal and postmenopausal disorder     Past Surgical History:  Procedure Laterality Date  . ATRIAL MYXOMA EXCISION Left   . CARDIAC CATHETERIZATION  2012   [facility]; right and left heart 75% ostial LAD lesion  . CORONARY ARTERY BYPASS GRAFT  2011   [facility]  . INSERT / REPLACE / REMOVE PACEMAKER  02/2013   Dual chamber MDT advisa pacemaker. [id number]  . VAGINAL HYSTERECTOMY      Current Outpatient Medications  Medication Sig Dispense Refill  . alendronate (FOSAMAX) 70 MG tablet TAKE 1 TABLET (70 MG TOTAL) BY MOUTH ONCE A WEEK. WITH FULL GLASS OF WATER AND DO NOT RECLINE 12 tablet 1  . amLODipine (NORVASC) 10 MG tablet Take 1  tablet (10 mg total) by mouth daily. 90 tablet 3  . atorvastatin (LIPITOR) 80 MG tablet TAKE 1 TABLET BY MOUTH EVERY DAY 30 tablet 0  . chlorthalidone (HYGROTON) 25 MG tablet Take 1 tablet (25 mg total) by mouth daily. 90 tablet 3  . famotidine (PEPCID) 20 MG tablet Take 1 tablet (20 mg total) by mouth daily. 90 tablet 0  . fluticasone (FLONASE) 50 MCG/ACT nasal spray SPRAY 2 SPRAYS IN EACH NOSTRIL AT BEDTIME 48 mL 1  . furosemide (LASIX) 40 MG tablet Take 1 tablet (40 mg total) by mouth as needed. 30 tablet 3  . Gauze Pads & Dressings (ABDOMINAL PAD) 3"H54" PADS 1 application by Does not apply route 4 (four) times daily. 360 each 1  . Incontinence Supply Disposable ([facility] BLADDER CONTROL  PAD) MISC 1 each by Does not apply route daily. 100 each 1  . levothyroxine (SYNTHROID) 88 MCG tablet Take 1 tablet (88 mcg total) by mouth daily. 90 tablet 1  . lisinopril (PRINIVIL,ZESTRIL) 20 MG tablet TAKE 1 TABLET (20 MG TOTAL) BY MOUTH DAILY. 90 tablet 3  . nystatin (MYCOSTATIN/NYSTOP) powder Apply topically 2 (two) times daily. 45 g 0  . OS-CAL CALCIUM + D3 500-200 MG-UNIT TABS TAKE 1 TABLET BY MOUTH EVERY DAY 90 tablet 0  . sertraline (ZOLOFT) 50 MG tablet Take 1 tablet (50 mg total) by mouth daily. 30 tablet 5  . sotalol (BETAPACE) 80 MG tablet TAKE 1 TABLET (80 MG TOTAL) BY MOUTH 2 (TWO) TIMES DAILY. 180 tablet 0  . Vitamin D, Ergocalciferol, (DRISDOL) 1.25 MG (50000 UT) CAPS capsule Take 1 capsule (50,000 Units total) by mouth every 7 (seven) days. 12 capsule 3  . warfarin (COUMADIN) 2.5 MG tablet TAKE AS DIRECTED BY THE ANTI-COAG CLINIC 120 tablet 1   No current facility-administered medications for this visit.    No Known Allergies  Review of Systems negative except from HPI and PMH  Physical Exam BP 130/80 (BP Location: Left Arm, Patient Position: Sitting, Cuff Size: Normal)   Pulse 82   Ht 4\' 11"  (1.499 m)   Wt 131 lb 8 oz (59.6 kg)   SpO2 97%   BMI 26.56 kg/m  Well developed and well nourished in no acute distress HENT normal Neck supple with JVP-flat Clear Device pocket well healed; without hematoma or erythema.  There is no tethering  Regular rate and rhythm, no  murmur Abd-soft with active BS No Clubbing cyanosis   edema Skin-warm and dry A & Oriented  Grossly normal sensory and motor function  ECG Apacing @ 82\ 27/07/39    Assessment and  Plan  Paroxysmal atrial fibrillation with posttermination pausing  Sinus node dysfunction  100% A pacing  High risk medication surveillance sotalol  HFpEF  Pacemaker Medtronic   Atrial myxoma history of resection  CABG X1   Euvolemic continue current meds  BP well controlled  No intercurrent atrial  fibrillation or flutter  On Anticoagulation;  No bleeding issues   Need to check surveillance labs, and will include CBC and TSH

## 2020-03-05 ENCOUNTER — Ambulatory Visit: Payer: Medicare Other | Admitting: Physician Assistant

## 2020-03-06 LAB — CUP PACEART INCLINIC DEVICE CHECK
Battery Remaining Longevity: 30 mo
Battery Voltage: 2.94 V
Brady Statistic AP VP Percent: 0.16 %
Brady Statistic AP VS Percent: 98.55 %
Brady Statistic AS VP Percent: 0 %
Brady Statistic AS VS Percent: 1.28 %
Brady Statistic RA Percent Paced: 98.43 %
Brady Statistic RV Percent Paced: 0.17 %
Date Time Interrogation Session: 20210928123500
Implantable Lead Implant Date: 20140919
Implantable Lead Implant Date: 20140919
Implantable Lead Location: 753859
Implantable Lead Location: 753860
Implantable Pulse Generator Implant Date: 20140919
Lead Channel Impedance Value: 380 Ohm
Lead Channel Impedance Value: 418 Ohm
Lead Channel Impedance Value: 475 Ohm
Lead Channel Impedance Value: 532 Ohm
Lead Channel Pacing Threshold Amplitude: 0.5 V
Lead Channel Pacing Threshold Amplitude: 0.875 V
Lead Channel Pacing Threshold Pulse Width: 0.4 ms
Lead Channel Pacing Threshold Pulse Width: 0.4 ms
Lead Channel Sensing Intrinsic Amplitude: 1.125 mV
Lead Channel Sensing Intrinsic Amplitude: 1.25 mV
Lead Channel Sensing Intrinsic Amplitude: 10.125 mV
Lead Channel Sensing Intrinsic Amplitude: 9.75 mV
Lead Channel Setting Pacing Amplitude: 2 V
Lead Channel Setting Pacing Amplitude: 2.5 V
Lead Channel Setting Pacing Pulse Width: 0.4 ms
Lead Channel Setting Sensing Sensitivity: 0.9 mV

## 2020-03-06 LAB — BASIC METABOLIC PANEL
BUN/Creatinine Ratio: 24 (ref 12–28)
BUN: 19 mg/dL (ref 8–27)
CO2: 24 mmol/L (ref 20–29)
Calcium: 9 mg/dL (ref 8.7–10.3)
Chloride: 105 mmol/L (ref 96–106)
Creatinine, Ser: 0.8 mg/dL (ref 0.57–1.00)
GFR calc Af Amer: 84 mL/min/{1.73_m2} (ref >59)
GFR calc non Af Amer: 73 mL/min/{1.73_m2} (ref >59)
Glucose: 85 mg/dL (ref 65–99)
Potassium: 4.4 mmol/L (ref 3.5–5.2)
Sodium: 141 mmol/L (ref 134–144)

## 2020-03-06 LAB — CBC WITH DIFFERENTIAL/PLATELET
Basophils Absolute: 0 10*3/uL (ref 0.0–0.2)
Basos: 1 %
EOS (ABSOLUTE): 0.1 10*3/uL (ref 0.0–0.4)
Eos: 2 %
Hematocrit: 37.8 % (ref 34.0–46.6)
Hemoglobin: 12.6 g/dL (ref 11.1–15.9)
Immature Grans (Abs): 0 10*3/uL (ref 0.0–0.1)
Immature Granulocytes: 0 %
Lymphocytes Absolute: 1.4 10*3/uL (ref 0.7–3.1)
Lymphs: 34 %
MCH: 31.3 pg (ref 26.6–33.0)
MCHC: 33.3 g/dL (ref 31.5–35.7)
MCV: 94 fL (ref 79–97)
Monocytes Absolute: 0.3 10*3/uL (ref 0.1–0.9)
Monocytes: 6 %
Neutrophils Absolute: 2.4 10*3/uL (ref 1.4–7.0)
Neutrophils: 57 %
Platelets: 147 10*3/uL — ABNORMAL LOW (ref 150–450)
RBC: 4.02 x10E6/uL (ref 3.77–5.28)
RDW: 12.8 % (ref 11.7–15.4)
WBC: 4.3 10*3/uL (ref 3.4–10.8)

## 2020-03-06 LAB — MAGNESIUM: Magnesium: 1.8 mg/dL (ref 1.6–2.3)

## 2020-03-06 LAB — TSH: TSH: 0.3 u[IU]/mL — ABNORMAL LOW (ref 0.450–4.500)

## 2020-03-08 ENCOUNTER — Other Ambulatory Visit: Payer: Self-pay | Admitting: Cardiovascular Disease

## 2020-03-08 DIAGNOSIS — E7800 Pure hypercholesterolemia, unspecified: Secondary | ICD-10-CM

## 2020-03-12 ENCOUNTER — Telehealth: Payer: Self-pay | Admitting: Family Medicine

## 2020-03-12 DIAGNOSIS — F339 Major depressive disorder, recurrent, unspecified: Secondary | ICD-10-CM

## 2020-03-12 NOTE — Telephone Encounter (Signed)
Requested medications are due for refill today?  Yes  Requested medications are on active medication list? Yes  Last Refill:  03/16/2019  # 30 with 5 refills.    Future visit scheduled?  No   Notes to Clinic:  Medication failed Rx refill protocol due to no valid encounter in the past 6 months.  Last office visit was 10 months ago.  Patient needs an appointment for an office visit.

## 2020-03-13 ENCOUNTER — Other Ambulatory Visit: Payer: Self-pay | Admitting: Family Medicine

## 2020-03-13 DIAGNOSIS — M8000XA Age-related osteoporosis with current pathological fracture, unspecified site, initial encounter for fracture: Secondary | ICD-10-CM

## 2020-03-13 DIAGNOSIS — E039 Hypothyroidism, unspecified: Secondary | ICD-10-CM

## 2020-03-13 NOTE — Telephone Encounter (Signed)
lvm to get pt scheduled for an appt

## 2020-03-13 NOTE — Telephone Encounter (Signed)
Requested medication (s) are due for refill today: yes  Requested medication (s) are on the active medication list: yes  Last refill: 12/31/2019  Future visit scheduled: no  Notes to clinic: 50,000 IU strengths are not delegated Patient has not contact office to schedule follow up yet    Requested Prescriptions  Pending Prescriptions Disp Refills   Vitamin D, Ergocalciferol, (DRISDOL) 1.25 MG (50000 UNIT) CAPS capsule [Pharmacy Med Name: VITAMIN D2 1.25MG (50,000 UNIT)] 12 capsule 3    Sig: TAKE 1 CAPSULE (50,000 UNITS TOTAL) BY MOUTH EVERY 7 (SEVEN) DAYS.      Endocrinology:  Vitamins - Vitamin D Supplementation Failed - 03/13/2020  6:05 PM      Failed - 50,000 IU strengths are not delegated      Failed - Phosphate in normal range and within 360 days    No results found for: PHOS        Failed - Vitamin D in normal range and within 360 days    No results found for: BW4665LD3, [id number], [id number], [place], 25OHVITD2, 25OHVITD3, 25OHVITD2, 25OHVITD1, 25OHVITD2, 25OHVITD3, VD25OH        Passed - Ca in normal range and within 360 days    Calcium  Date Value Ref Range Status  03/04/2020 9.0 8.7 - 10.3 mg/dL Final          Passed - Valid encounter within last 12 months    Recent Outpatient Visits           10 months ago GERD without esophagitis   [facility] [place], Drue Stager, MD   12 months ago Major depression, recurrent, chronic [facility])   [facility] Steele Sizer, MD   1 year ago Adult hypothyroidism   [facility] Steele Sizer, MD   1 year ago Closed fracture of distal end of right fibula, unspecified fracture morphology, initial encounter   [facility] Delsa Grana, PA-C   2 years ago Adult hypothyroidism   [place], NP       Future Appointments             In 6 days Gilford Rile, Martie Lee, NP [place]              Signed Prescriptions Disp Refills   levothyroxine (SYNTHROID) 88 MCG tablet 30 tablet 0    Sig: TAKE 1 TABLET BY MOUTH EVERY DAY      Endocrinology:  Hypothyroid Agents Failed - 03/13/2020  6:05 PM      Failed - TSH needs to be rechecked within 3 months after an abnormal result. Refill until TSH is due.      Failed - TSH in normal range and within 360 days    TSH  Date Value Ref Range Status  03/04/2020 0.300 (L) 0.450 - 4.500 uIU/mL Final          Passed - Valid encounter within last 12 months    Recent Outpatient Visits           10 months ago GERD without esophagitis   [facility] [place], Drue Stager, MD   12 months ago Major depression, recurrent, chronic [facility])   [facility] Steele Sizer, MD   1 year ago Adult hypothyroidism   [facility] Steele Sizer, MD   1 year ago Closed fracture of distal end of right fibula, unspecified fracture morphology, initial encounter   [facility] Delsa Grana,  PA-C   2 years ago Adult hypothyroidism   [place], NP       Future Appointments             In 6 days Gilford Rile, Martie Lee, NP [facility], [place]

## 2020-03-14 NOTE — Telephone Encounter (Signed)
lvm for pt to schedule an appt 

## 2020-03-19 ENCOUNTER — Ambulatory Visit (INDEPENDENT_AMBULATORY_CARE_PROVIDER_SITE_OTHER): Payer: Medicare Other

## 2020-03-19 ENCOUNTER — Ambulatory Visit: Payer: Medicare Other | Admitting: Family

## 2020-03-19 ENCOUNTER — Other Ambulatory Visit: Payer: Self-pay

## 2020-03-19 ENCOUNTER — Telehealth: Payer: Self-pay

## 2020-03-19 DIAGNOSIS — Z5181 Encounter for therapeutic drug level monitoring: Secondary | ICD-10-CM | POA: Diagnosis not present

## 2020-03-19 DIAGNOSIS — I4891 Unspecified atrial fibrillation: Secondary | ICD-10-CM

## 2020-03-19 LAB — POCT INR: INR: 2.3 (ref 2.0–3.0)

## 2020-03-19 NOTE — Progress Notes (Deleted)
Office Visit    Patient Name: Felicia Poole Date of Encounter: 03/19/2020  Primary Care Provider:  Steele Sizer, MD Primary Cardiologist:  Virl Axe, MD Electrophysiologist:  None   Chief Complaint    Felicia Poole is a 74 y.o. female with a hx of atrial fibrillation with post termination pauses s/p Medtronic PPM, prior atrial myxoma s/p resection and CAD s/p one-vessel CABG at [facility] in 2012, nonischemic cardiomyopathy/HFrEF presents today for ***   Past Medical History    Past Medical History:  Diagnosis Date  . Atrial myxoma    a. 2012 s/p resection at [facility].  . Benign neoplasm of heart   . Contact dermatitis and other eczema, due to unspecified cause   . Contusion of unspecified site   . Coronary atherosclerosis of unspecified type of vessel, native or graft    a. 08/2010 s/p CABG x 1 (LIMA->LAD @ time of atrial myxoma resection ([facility]); b. 01/2013 MV: No ischemia/infarct. EF 71%.  . Cramp of limb   . Essential hypertension, benign   . Heartburn   . Hyperlipidemia LDL goal <70   . Lumbago   . Nonischemic cardiomyopathy ([place])    a. EF: 45% in 2012; b. 01/2013 Echo: EF 45-50%, mild diff HK. Mild to mod MR. Mild BAE. Mod TR.  . Osteoporosis, unspecified   . Other specified cardiac dysrhythmias(427.89)   . PAF (paroxysmal atrial fibrillation) (HCC)    a. CHA2DS2VASc = 6-->warfarin.  Rhythm management with sotalol.  Marland Kitchen Postsurgical percutaneous transluminal coronary angioplasty status   . Tachy-brady syndrome ([place])    a. 02/2013 s/p MDT [id number] Advisa DR MRI DC PPM.  . Type II or unspecified type diabetes mellitus without mention of complication, not stated as uncontrolled   . Unspecified hypothyroidism   . Unspecified menopausal and postmenopausal disorder    Past Surgical History:  Procedure Laterality Date  . ATRIAL MYXOMA EXCISION Left   . CARDIAC CATHETERIZATION  2012   [facility]; right and left heart 75% ostial LAD lesion  . CORONARY ARTERY BYPASS GRAFT  2011   [facility]  .  INSERT / REPLACE / REMOVE PACEMAKER  02/2013   Dual chamber MDT advisa pacemaker. MVP-R70  . VAGINAL HYSTERECTOMY      Allergies  No Known Allergies  History of Present Illness    Felicia Poole is a 74 y.o. female with a hx of  atrial fibrillation with post termination pauses s/p Medtronic PPM, prior atrial myxoma s/p resection and CAD s/p one-vessel CABG at [facility] in 2012, nonischemic cardiomyopathy/HFrEF last seen ***.   EKGs/Labs/Other Studies Reviewed:   The following studies were reviewed today: *** EKG:  EKG is *** ordered today.  The ekg ordered today demonstrates ***  Recent Labs: 03/04/2020: BUN 19; Creatinine, Ser 0.80; Hemoglobin 12.6; Magnesium 1.8; Platelets 147; Potassium 4.4; Sodium 141; TSH 0.300  Recent Lipid Panel    Component Value Date/Time   CHOL 115 02/05/2019 0000   CHOL 228 (H) 04/29/2015 1210   TRIG 124 02/05/2019 0000   HDL 43 (L) 02/05/2019 0000   HDL 46 04/29/2015 1210   CHOLHDL 2.7 02/05/2019 0000   LDLCALC 51 02/05/2019 0000    Risk Assessment/Calculations:  {Does this patient have ATRIAL FIBRILLATION?:[id number]}  Home Medications   No outpatient medications have been marked as taking for the 03/19/20 encounter (Appointment) with Loel Dubonnet, NP.     Review of Systems   ***   ROS All other systems reviewed and are otherwise negative except as noted above.  Physical Exam    VS:  There were no vitals taken for this visit. , BMI There is no height or weight on file to calculate BMI.  Wt Readings from Last 3 Encounters:  03/04/20 131 lb 8 oz (59.6 kg)  05/23/19 122 lb 12 oz (55.7 kg)  03/16/19 127 lb (57.6 kg)     GEN: Well nourished, well developed, in no acute distress. HEENT: normal. Neck: Supple, no JVD, carotid bruits, or masses. Cardiac: ***RRR, no murmurs, rubs, or gallops. No clubbing, cyanosis, edema.  ***Radials/DP/PT 2+ and equal bilaterally.  Respiratory:  ***Respirations regular and unlabored, clear to  auscultation bilaterally. GI: Soft, nontender, nondistended. MS: No deformity or atrophy. Skin: Warm and dry, no rash. Neuro:  Strength and sensation are intact. Psych: Normal affect.  Assessment & Plan    1. ***  Disposition: Follow up {follow up:15908} with ***   Signed, Loel Dubonnet, NP 03/19/2020, 9:57 AM [place]

## 2020-03-19 NOTE — Telephone Encounter (Signed)
lvm for pt to call back to get scheduled for an appt

## 2020-03-19 NOTE — Telephone Encounter (Signed)
Pt need appt for [facility] pull up supplies

## 2020-03-19 NOTE — Patient Instructions (Signed)
-   continue dosage of warfarin 1 (2.5 mg) tablet every day EXCEPT 1.5 tablets on MONDAYS & FRIDAYS. - recheck in 6 weeks 

## 2020-03-25 NOTE — Progress Notes (Deleted)
Name: Felicia Poole   MRN: [id number]    DOB: January 02, 1946   Date:03/25/2020       Progress Note  Subjective  Chief Complaint  Follow up: FL2 Paperwork/Foot (toenail) pain   HPI GERD: she states she noticed heartburn last couple of weeks has been more frequent, but not daily,  daughter did not feel comfortable giving her any otc medication. She states symptoms are throughout the day, not associated with activity, no diaphoresis or associated SOB. No nausea or vomiting, but has been spitting up some clear liquid sometimes . No blood in stools or change in bowel movements  *** Patient Active Problem List   Diagnosis Date Noted  . Angina pectoris ([place]) 04/11/2017  . Chronic anticoagulation 04/29/2015  . Obesity 12/10/2014  . Hypercholesterolemia 10/15/2013  . Encounter for therapeutic drug monitoring 08/08/2013  . Pacemaker -Medtronic 05/24/2013  . Nonischemic cardiomyopathy ([place])   . Atrial fibrillation ([place]) 02/28/2013  . Arteriosclerosis of coronary artery 02/22/2013  . Adult hypothyroidism 02/22/2013  . Congestive heart failure with left ventricular systolic dysfunction ([place]) 02/22/2013  . Tachycardia-bradycardia ([place]) 02/22/2013  . Essential hypertension, benign   . Atrial myxoma   . Coronary atherosclerosis     Past Surgical History:  Procedure Laterality Date  . ATRIAL MYXOMA EXCISION Left   . CARDIAC CATHETERIZATION  2012   Unc; right and left heart 75% ostial LAD lesion  . CORONARY ARTERY BYPASS GRAFT  2011   [facility]  . INSERT / REPLACE / REMOVE PACEMAKER  02/2013   Dual chamber MDT advisa pacemaker. MVP-R70  . VAGINAL HYSTERECTOMY      Family History  Problem Relation Age of Onset  . Heart failure Mother   . Cirrhosis Mother   . Hyperlipidemia Father   . Hypertension Father   . Stroke Father   . Heart attack Father     Social History   Tobacco Use  . Smoking status: Never Smoker  . Smokeless tobacco: Never Used  . Tobacco comment: smoking cessation  materials not required  Substance Use Topics  . Alcohol use: No     Current Outpatient Medications:  Marland Kitchen  Vitamin D, Ergocalciferol, (DRISDOL) 1.25 MG (50000 UNIT) CAPS capsule, TAKE 1 CAPSULE (50,000 UNITS TOTAL) BY MOUTH EVERY 7 (SEVEN) DAYS., Disp: 12 capsule, Rfl: 3 .  alendronate (FOSAMAX) 70 MG tablet, TAKE 1 TABLET (70 MG TOTAL) BY MOUTH ONCE A WEEK. WITH FULL GLASS OF WATER AND DO NOT RECLINE, Disp: 12 tablet, Rfl: 1 .  amLODipine (NORVASC) 10 MG tablet, Take 1 tablet (10 mg total) by mouth daily., Disp: 90 tablet, Rfl: 3 .  atorvastatin (LIPITOR) 80 MG tablet, TAKE 1 TABLET BY MOUTH EVERY DAY, Disp: 15 tablet, Rfl: 0 .  chlorthalidone (HYGROTON) 25 MG tablet, Take 1 tablet (25 mg total) by mouth daily., Disp: 90 tablet, Rfl: 3 .  famotidine (PEPCID) 20 MG tablet, Take 1 tablet (20 mg total) by mouth daily., Disp: 90 tablet, Rfl: 0 .  fluticasone (FLONASE) 50 MCG/ACT nasal spray, SPRAY 2 SPRAYS IN EACH NOSTRIL AT BEDTIME, Disp: 48 mL, Rfl: 1 .  furosemide (LASIX) 40 MG tablet, Take 1 tablet (40 mg total) by mouth as needed., Disp: 30 tablet, Rfl: 3 .  Gauze Pads & Dressings (ABDOMINAL PAD) 7"J88" PADS, 1 application by Does not apply route 4 (four) times daily., Disp: 360 each, Rfl: 1 .  Incontinence Supply Disposable ([employer] BLADDER CONTROL PAD) MISC, 1 each by Does not apply route daily., Disp: 100 each, Rfl: 1 .  levothyroxine (SYNTHROID) 88 MCG tablet, TAKE 1 TABLET BY MOUTH EVERY DAY, Disp: 30 tablet, Rfl: 0 .  lisinopril (PRINIVIL,ZESTRIL) 20 MG tablet, TAKE 1 TABLET (20 MG TOTAL) BY MOUTH DAILY., Disp: 90 tablet, Rfl: 3 .  nystatin (MYCOSTATIN/NYSTOP) powder, Apply topically 2 (two) times daily., Disp: 45 g, Rfl: 0 .  OS-CAL CALCIUM + D3 500-200 MG-UNIT TABS, TAKE 1 TABLET BY MOUTH EVERY DAY, Disp: 90 tablet, Rfl: 0 .  sertraline (ZOLOFT) 50 MG tablet, TAKE 1 TABLET BY MOUTH EVERY DAY, Disp: 30 tablet, Rfl: 0 .  sotalol (BETAPACE) 80 MG tablet, TAKE 1 TABLET (80 MG TOTAL) BY MOUTH 2  (TWO) TIMES DAILY., Disp: 180 tablet, Rfl: 0 .  warfarin (COUMADIN) 2.5 MG tablet, TAKE AS DIRECTED BY THE ANTI-COAG CLINIC, Disp: 120 tablet, Rfl: 1  No Known Allergies  I personally reviewed {Reviewed:14835} with the patient/caregiver today.   ROS  ***  Objective  There were no vitals filed for this visit.  There is no height or weight on file to calculate BMI.  Physical Exam ***  Recent Results (from the past 2160 hour(s))  POCT INR     Status: None   Collection Time: 01/02/20 11:46 AM  Result Value Ref Range   INR 2.1 2.0 - 3.0  POCT INR     Status: None   Collection Time: 02/06/20 11:54 AM  Result Value Ref Range   INR 2.2 2.0 - 3.0  CUP PACEART INCLINIC DEVICE CHECK     Status: None   Collection Time: 03/04/20 12:35 PM  Result Value Ref Range   Date Time Interrogation Session [id number]    Pulse Generator Manufacturer MERM    Pulse Gen Model [id number] Advisa DR MRI    Pulse Gen Serial Number [id number] [facility] [place]    Implantable Pulse Generator Type Implantable Pulse Generator    Implantable Pulse Generator Implant Date 08657846    Implantable Lead Manufacturer MERM    Implantable Lead Model 5086MRI CapSureFix MRI SureScan    Implantable Lead Serial Number [id number]    Implantable Lead Implant Date 96295284    Implantable Lead Location [id number]    Implantable Lead Manufacturer MERM    Implantable Lead Model 5086MRI CapSureFix MRI SureScan    Implantable Lead Serial Number [id number]    Implantable Lead Implant Date 13244010    Implantable Lead Location [id number]    Lead Channel Setting Sensing Sensitivity 0.9 mV   Lead Channel Setting Pacing Amplitude 2 V   Lead Channel Setting Pacing Pulse Width 0.4 ms   Lead Channel Setting Pacing Amplitude 2.5 V   Lead Channel Impedance Value 418 ohm   Lead Channel Impedance Value 380 ohm   Lead Channel Sensing Intrinsic Amplitude 1.125 mV   Lead Channel Sensing Intrinsic Amplitude 1.25 mV   Lead  Channel Pacing Threshold Amplitude 0.5 V   Lead Channel Pacing Threshold Pulse Width 0.4 ms   Lead Channel Impedance Value 532 ohm   Lead Channel Impedance Value 475 ohm   Lead Channel Sensing Intrinsic Amplitude 9.75 mV   Lead Channel Sensing Intrinsic Amplitude 10.125 mV   Lead Channel Pacing Threshold Amplitude 0.875 V   Lead Channel Pacing Threshold Pulse Width 0.4 ms   Battery Status OK    Battery Remaining Longevity 30 mo   Battery Voltage 2.94 V   Brady Statistic RA Percent Paced 98.43 %   Brady Statistic RV Percent Paced 0.17 %   Brady Statistic AP VP Percent 0.16 %  Brady Statistic AS VP Percent 0 %   Brady Statistic AP VS Percent 98.55 %   Brady Statistic AS VS Percent 1.28 %   Eval Rhythm APaced down to 30/ ZO10   Basic metabolic panel     Status: None   Collection Time: 03/04/20  1:42 PM  Result Value Ref Range   Glucose 85 65 - 99 mg/dL   BUN 19 8 - 27 mg/dL   Creatinine, Ser 0.80 0.57 - 1.00 mg/dL   GFR calc non Af Amer 73 >59 mL/min/1.73   GFR calc Af Amer 84 >59 mL/min/1.73    Comment: **[facility] currently reports eGFR in compliance with the current**   recommendations of the [employer]. [facility] will   update reporting as new guidelines are published from the NKF-ASN   Task force.    BUN/Creatinine Ratio 24 12 - 28   Sodium 141 134 - 144 mmol/L   Potassium 4.4 3.5 - 5.2 mmol/L   Chloride 105 96 - 106 mmol/L   CO2 24 20 - 29 mmol/L   Calcium 9.0 8.7 - 10.3 mg/dL  CBC w/Diff     Status: Abnormal   Collection Time: 03/04/20  1:42 PM  Result Value Ref Range   WBC 4.3 3.4 - 10.8 x10E3/uL   RBC 4.02 3.77 - 5.28 x10E6/uL   Hemoglobin 12.6 11.1 - 15.9 g/dL   Hematocrit 37.8 34.0 - 46.6 %   MCV 94 79 - 97 fL   MCH 31.3 26.6 - 33.0 pg   MCHC 33.3 31 - 35 g/dL   RDW 12.8 11.7 - 15.4 %   Platelets 147 (L) 150 - 450 x10E3/uL   Neutrophils 57 Not Estab. %   Lymphs 34 Not Estab. %   Monocytes 6 Not Estab. %   Eos 2 Not Estab. %   Basos 1 Not  Estab. %   Neutrophils Absolute 2.4 1.40 - 7.00 x10E3/uL   Lymphocytes Absolute 1.4 0 - 3 x10E3/uL   Monocytes Absolute 0.3 0 - 0 x10E3/uL   EOS (ABSOLUTE) 0.1 0.0 - 0.4 x10E3/uL   Basophils Absolute 0.0 0 - 0 x10E3/uL   Immature Granulocytes 0 Not Estab. %   Immature Grans (Abs) 0.0 0.0 - 0.1 x10E3/uL  TSH     Status: Abnormal   Collection Time: 03/04/20  1:42 PM  Result Value Ref Range   TSH 0.300 (L) 0.450 - 4.500 uIU/mL  Magnesium     Status: None   Collection Time: 03/04/20  1:42 PM  Result Value Ref Range   Magnesium 1.8 1.6 - 2.3 mg/dL  POCT INR     Status: None   Collection Time: 03/19/20 12:02 PM  Result Value Ref Range   INR 2.3 2.0 - 3.0    Diabetic Foot Exam: Diabetic Foot Exam - Simple   No data filed     ***  PHQ2/9: Depression screen [facility] 2/9 04/26/2019 03/16/2019 02/05/2019 01/26/2019 01/24/2018  Decreased Interest 0 1 2 0 0  Down, Depressed, Hopeless 0 0 2 3 0  PHQ - 2 Score 0 '1 4 3 ' 0  Altered sleeping 0 0 3 3 0  Tired, decreased energy 0 1 1 0 0  Change in appetite 0 0 0 0 0  Feeling bad or failure about yourself  0 0 1 1 0  Trouble concentrating 0 1 0 0 0  Moving slowly or fidgety/restless 0 '1 3 1 ' 0  Suicidal thoughts 0 0 0 0 0  PHQ-9 Score 0 4  12 8 0  Difficult doing work/chores - Somewhat difficult Not difficult at all Not difficult at all Not difficult at all  Some recent data might be hidden    phq 9 is {gen pos XJO:[id number]} ***  Fall Risk: Fall Risk  04/26/2019 02/05/2019 01/26/2019 01/24/2018 10/11/2017  Falls in the past year? 0 1 1 Yes No  Comment - - - fell getting out of church bus -  Number falls in past yr: 0 '1 1 1 ' -  Injury with Fall? 0 1 1 No -  Risk for fall due to : - - - Impaired vision;Medication side effect -  Risk for fall due to: Comment - - - wears eyeglasses -  Follow up - - - Falls evaluation completed;Education provided;Falls prevention discussed -   ***   Functional Status Survey:   ***   Assessment &  Plan  *** There are no diagnoses linked to this encounter.

## 2020-03-26 ENCOUNTER — Ambulatory Visit: Payer: Medicare Other | Admitting: Family Medicine

## 2020-03-27 ENCOUNTER — Other Ambulatory Visit: Payer: Self-pay | Admitting: Cardiovascular Disease

## 2020-03-27 DIAGNOSIS — E7800 Pure hypercholesterolemia, unspecified: Secondary | ICD-10-CM

## 2020-03-27 NOTE — Telephone Encounter (Signed)
No answer, No VM

## 2020-03-27 NOTE — Telephone Encounter (Signed)
Please schedule overdue office visit for refills. Thank you! 

## 2020-04-01 ENCOUNTER — Other Ambulatory Visit: Payer: Self-pay | Admitting: Family Medicine

## 2020-04-01 DIAGNOSIS — Z1231 Encounter for screening mammogram for malignant neoplasm of breast: Secondary | ICD-10-CM

## 2020-04-01 DIAGNOSIS — E2839 Other primary ovarian failure: Secondary | ICD-10-CM

## 2020-04-01 NOTE — Telephone Encounter (Signed)
Pt scheduled for March 2022

## 2020-04-01 NOTE — Telephone Encounter (Signed)
Rx request sent to pharmacy.  

## 2020-04-09 NOTE — Progress Notes (Signed)
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 Caryl Comes, denies bleeding.   CHF systolic: she is on lasix, ace inhibitor. She has orthopnea, but no lower extremity edema. Currently wheezing or sob. She is compliant with cardiologist visits.   Hypothyroidism: taking medication as prescribed, denies hair loss or dry skin, no change in bowel movement   GERD: she states doing better, very seldom chokes on her food when she east fast. No odynophagia.   HTN: taking medication daily, no side effects, chest pain intermittently, sob sometimes with activity but no palpitation  Mixed incontinence: she has been wearing depends for over one year, she has difficulty making to the bathroom on time, has urgency and stress symptoms.   Patient Active Problem List   Diagnosis Date Noted   Angina pectoris ([place]) 04/11/2017   Chronic anticoagulation 04/29/2015   Obesity 12/10/2014   Hypercholesterolemia 10/15/2013   Myoma 10/15/2013   Encounter for therapeutic drug monitoring 08/08/2013   Pacemaker -Medtronic 05/24/2013   Nonischemic cardiomyopathy ([place])    Atrial fibrillation ([place]) 02/28/2013   Arteriosclerosis of coronary artery  02/22/2013   Adult hypothyroidism 02/22/2013   Congestive heart failure with left ventricular systolic dysfunction ([place]) 02/22/2013   Tachycardia-bradycardia ([place]) 02/22/2013   Essential hypertension, benign    Atrial myxoma    Coronary atherosclerosis     Past Surgical History:  Procedure Laterality Date   ATRIAL MYXOMA EXCISION Left    CARDIAC CATHETERIZATION  2012   [facility]; right and left heart 75% ostial LAD lesion   CORONARY ARTERY BYPASS GRAFT  2011   [facility]   INSERT / REPLACE / REMOVE PACEMAKER  02/2013   Dual chamber MDT advisa pacemaker. [id number]   VAGINAL HYSTERECTOMY      Family History  Problem Relation Age of Onset   Heart failure Mother    Cirrhosis Mother    Hyperlipidemia Father    Hypertension Father    Stroke Father    Heart attack Father     Social History   Tobacco Use   Smoking status: Never Smoker   Smokeless tobacco: Never Used   Tobacco comment: smoking cessation materials not required  Substance Use Topics   Alcohol use: No     Current Outpatient Medications:    alendronate (FOSAMAX) 70 MG tablet, TAKE 1 TABLET (70 MG TOTAL) BY MOUTH ONCE A WEEK. WITH FULL GLASS OF WATER AND DO NOT RECLINE, Disp: 12 tablet, Rfl: 1   amLODipine (NORVASC) 10 MG tablet, Take 1 tablet (10 mg total) by mouth daily., Disp: 90 tablet, Rfl: 3   atorvastatin (LIPITOR) 80 MG tablet, TAKE 1 TABLET BY MOUTH EVERY DAY, Disp: 30 tablet, Rfl: 6   chlorthalidone (HYGROTON) 25 MG tablet, Take 1 tablet (  25 mg total) by mouth daily., Disp: 90 tablet, Rfl: 3   famotidine (PEPCID) 20 MG tablet, Take 1 tablet (20 mg total) by mouth daily., Disp: 90 tablet, Rfl: 0   fluticasone (FLONASE) 50 MCG/ACT nasal spray, SPRAY 2 SPRAYS IN EACH NOSTRIL AT BEDTIME, Disp: 48 mL, Rfl: 1   furosemide (LASIX) 40 MG tablet, Take 1 tablet (40 mg total) by mouth as needed., Disp: 30 tablet, Rfl: 3   Gauze Pads & Dressings (ABDOMINAL PAD) 3"I14" PADS, 1 application by Does not  apply route 4 (four) times daily., Disp: 360 each, Rfl: 1   Incontinence Supply Disposable ([employer] BLADDER CONTROL PAD) MISC, 1 each by Does not apply route daily., Disp: 100 each, Rfl: 1   levothyroxine (SYNTHROID) 88 MCG tablet, TAKE 1 TABLET BY MOUTH EVERY DAY, Disp: 30 tablet, Rfl: 0   lisinopril (PRINIVIL,ZESTRIL) 20 MG tablet, TAKE 1 TABLET (20 MG TOTAL) BY MOUTH DAILY., Disp: 90 tablet, Rfl: 3   nystatin (MYCOSTATIN/NYSTOP) powder, Apply topically 2 (two) times daily., Disp: 45 g, Rfl: 0   OS-CAL CALCIUM + D3 500-200 MG-UNIT TABS, TAKE 1 TABLET BY MOUTH EVERY DAY, Disp: 90 tablet, Rfl: 0   sertraline (ZOLOFT) 50 MG tablet, TAKE 1 TABLET BY MOUTH EVERY DAY, Disp: 30 tablet, Rfl: 0   sotalol (BETAPACE) 80 MG tablet, TAKE 1 TABLET (80 MG TOTAL) BY MOUTH 2 (TWO) TIMES DAILY., Disp: 180 tablet, Rfl: 0   Vitamin D, Ergocalciferol, (DRISDOL) 1.25 MG (50000 UNIT) CAPS capsule, TAKE 1 CAPSULE (50,000 UNITS TOTAL) BY MOUTH EVERY 7 (SEVEN) DAYS., Disp: 12 capsule, Rfl: 3   warfarin (COUMADIN) 2.5 MG tablet, TAKE AS DIRECTED BY THE ANTI-COAG CLINIC, Disp: 120 tablet, Rfl: 1  No Known Allergies  I personally reviewed active problem list, medication list, allergies, family history, social history, health maintenance with the patient/caregiver today.   ROS  Constitutional: Negative for fever or weight change.  Respiratory: Negative for cough and shortness of breath.   Cardiovascular: positive for chest pain but no  palpitations.  Gastrointestinal: Negative for abdominal pain, no bowel changes.  Musculoskeletal: Negative for gait problem or joint swelling.  Skin: Negative for rash.  Neurological: Negative for dizziness or headache.  No other specific complaints in a complete review of systems (except as listed in HPI above).  Objective  Vitals:   04/10/20 1004  BP: 124/84  Pulse: 80  Resp: 15  Temp: 98.1 F (36.7 C)  TempSrc: Oral  SpO2: 94%  Weight: 131 lb 8 oz (59.6 kg)  Height:  '4\' 9"'  (1.448 m)    Body mass index is 28.46 kg/m.  Physical Exam  Constitutional: Patient appears well-developed and well-nourished. Overweight. No distress.  HEENT: head atraumatic, normocephalic, pupils equal and reactive to light,  neck supple Cardiovascular: Normal rate, regular rhythm and normal heart sounds.  No murmur heard. No BLE edema. Pulmonary/Chest: Effort normal and breath sounds normal. No respiratory distress. Abdominal: Soft.  There is no tenderness. Skin: senile purpura  Psychiatric: Patient has a normal mood and affect. behavior is normal. Judgment and thought content normal.  Recent Results (from the past 2160 hour(s))  POCT INR     Status: None   Collection Time: 02/06/20 11:54 AM  Result Value Ref Range   INR 2.2 2.0 - 3.0  CUP PACEART INCLINIC DEVICE CHECK     Status: None   Collection Time: 03/04/20 12:35 PM  Result Value Ref Range   Date Time Interrogation Session [id number]    Pulse Generator  Manufacturer MERM    Pulse Gen Model [id number] Advisa DR MRI    Pulse Gen Serial Number [id number]    Clinic Name [facility]    Implantable Pulse Generator Type Implantable Pulse Generator    Implantable Pulse Generator Implant Date 16109604    Implantable Lead Manufacturer MERM    Implantable Lead Model 5086MRI CapSureFix MRI SureScan    Implantable Lead Serial Number [id number]    Implantable Lead Implant Date 47829562    Implantable Lead Location [phone]    Implantable Lead Manufacturer MERM    Implantable Lead Model 5086MRI CapSureFix MRI SureScan    Implantable Lead Serial Number [id number]    Implantable Lead Implant Date [id number]    Implantable Lead Location [phone]    Lead Channel Setting Sensing Sensitivity 0.9 mV   Lead Channel Setting Pacing Amplitude 2 V   Lead Channel Setting Pacing Pulse Width 0.4 ms   Lead Channel Setting Pacing Amplitude 2.5 V   Lead Channel Impedance Value 418 ohm   Lead Channel Impedance Value 380 ohm   Lead  Channel Sensing Intrinsic Amplitude 1.125 mV   Lead Channel Sensing Intrinsic Amplitude 1.25 mV   Lead Channel Pacing Threshold Amplitude 0.5 V   Lead Channel Pacing Threshold Pulse Width 0.4 ms   Lead Channel Impedance Value 532 ohm   Lead Channel Impedance Value 475 ohm   Lead Channel Sensing Intrinsic Amplitude 9.75 mV   Lead Channel Sensing Intrinsic Amplitude 10.125 mV   Lead Channel Pacing Threshold Amplitude 0.875 V   Lead Channel Pacing Threshold Pulse Width 0.4 ms   Battery Status OK    Battery Remaining Longevity 30 mo   Battery Voltage 2.94 V   Brady Statistic RA Percent Paced 98.43 %   Brady Statistic RV Percent Paced 0.17 %   Brady Statistic AP VP Percent 0.16 %   Brady Statistic AS VP Percent 0 %   Brady Statistic AP VS Percent 98.55 %   Brady Statistic AS VS Percent 1.28 %   Eval Rhythm APaced down to 30/ KG40   Basic metabolic panel     Status: None   Collection Time: 03/04/20  1:42 PM  Result Value Ref Range   Glucose 85 65 - 99 mg/dL   BUN 19 8 - 27 mg/dL   Creatinine, Ser 0.80 0.57 - 1.00 mg/dL   GFR calc non Af Amer 73 >59 mL/min/1.73   GFR calc Af Amer 84 >59 mL/min/1.73    Comment: **[facility] currently reports eGFR in compliance with the current**   recommendations of the [employer]. [facility] will   update reporting as new guidelines are published from the NKF-ASN   Task force.    BUN/Creatinine Ratio 24 12 - 28   Sodium 141 134 - 144 mmol/L   Potassium 4.4 3.5 - 5.2 mmol/L   Chloride 105 96 - 106 mmol/L   CO2 24 20 - 29 mmol/L   Calcium 9.0 8.7 - 10.3 mg/dL  CBC w/Diff     Status: Abnormal   Collection Time: 03/04/20  1:42 PM  Result Value Ref Range   WBC 4.3 3.4 - 10.8 x10E3/uL   RBC 4.02 3.77 - 5.28 x10E6/uL   Hemoglobin 12.6 11.1 - 15.9 g/dL   Hematocrit 37.8 34.0 - 46.6 %   MCV 94 79 - 97 fL   MCH 31.3 26.6 - 33.0 pg   MCHC 33.3 31 - 35 g/dL   RDW 12.8 11.7 - 15.4 %   Platelets 147 (L)  150 - 450 x10E3/uL   Neutrophils 57 Not  Estab. %   Lymphs 34 Not Estab. %   Monocytes 6 Not Estab. %   Eos 2 Not Estab. %   Basos 1 Not Estab. %   Neutrophils Absolute 2.4 1.40 - 7.00 x10E3/uL   Lymphocytes Absolute 1.4 0 - 3 x10E3/uL   Monocytes Absolute 0.3 0 - 0 x10E3/uL   EOS (ABSOLUTE) 0.1 0.0 - 0.4 x10E3/uL   Basophils Absolute 0.0 0 - 0 x10E3/uL   Immature Granulocytes 0 Not Estab. %   Immature Grans (Abs) 0.0 0.0 - 0.1 x10E3/uL  TSH     Status: Abnormal   Collection Time: 03/04/20  1:42 PM  Result Value Ref Range   TSH 0.300 (L) 0.450 - 4.500 uIU/mL  Magnesium     Status: None   Collection Time: 03/04/20  1:42 PM  Result Value Ref Range   Magnesium 1.8 1.6 - 2.3 mg/dL  POCT INR     Status: None   Collection Time: 03/19/20 12:02 PM  Result Value Ref Range   INR 2.3 2.0 - 3.0      PHQ2/9: Depression screen [facility] 2/9 04/10/2020 04/26/2019 03/16/2019 02/05/2019 01/26/2019  Decreased Interest 1 0 1 2 0  Down, Depressed, Hopeless 1 0 0 2 3  PHQ - 2 Score 2 0 '1 4 3  ' Altered sleeping 1 0 0 3 3  Tired, decreased energy 3 0 1 1 0  Change in appetite 0 0 0 0 0  Feeling bad or failure about yourself  0 0 0 1 1  Trouble concentrating 0 0 1 0 0  Moving slowly or fidgety/restless 0 0 '1 3 1  ' Suicidal thoughts 0 0 0 0 0  PHQ-9 Score 6 0 '4 12 8  ' Difficult doing work/chores Not difficult at all - Somewhat difficult Not difficult at all Not difficult at all  Some recent data might be hidden    phq 9 is positive   Fall Risk: Fall Risk  04/10/2020 04/10/2020 04/26/2019 02/05/2019 01/26/2019  Falls in the past year? 0 0 0 1 1  Comment - - - - -  Number falls in past yr: 0 0 0 1 1  Injury with Fall? 0 0 0 1 1  Risk for fall due to : - - - - -  Risk for fall due to: Comment - - - - -  Follow up - - - - -     Functional Status Survey: Is the patient deaf or have difficulty hearing?: No Does the patient have difficulty seeing, even when wearing glasses/contacts?: No Does the patient have difficulty concentrating,  remembering, or making decisions?: No Does the patient have difficulty walking or climbing stairs?: Yes Does the patient have difficulty dressing or bathing?: No Does the patient have difficulty doing errands alone such as visiting a doctor's office or shopping?: Yes    Assessment & Plan  1. Senile purpura ([place])   2. Mixed stress and urge urinary incontinence   3. Age-related osteoporosis without current pathological fracture  - VITAMIN D 25 Hydroxy (Vit-D Deficiency, Fractures)  4.CKI stage III  - COMPLETE METABOLIC PANEL WITH GFR - CBC with Differential/Platelet - VITAMIN D 25 Hydroxy (Vit-D Deficiency, Fractures)  5. Dyslipidemia  - Lipid panel  6. Adult hypothyroidism  - TSH  7. Hyperglycemia  - Hemoglobin A1c  8. Angina pectoris ([place])   9. Nonischemic cardiomyopathy ([place])   10. Major depression, recurrent, chronic (HCC)  - sertraline (ZOLOFT)  100 MG tablet; Take 1 tablet (100 mg total) by mouth daily.  Dispense: 90 tablet; Refill: 1  11. Congestive heart failure with left ventricular systolic dysfunction ([place])   12. GERD without esophagitis   13. Seasonal allergies  - fluticasone (FLONASE) 50 MCG/ACT nasal spray; SPRAY 2 SPRAYS IN EACH NOSTRIL AT BEDTIME  Dispense: 48 mL; Refill: 1  14. Thrombocytopenia ([place])  Recheck CBC

## 2020-04-10 ENCOUNTER — Ambulatory Visit (INDEPENDENT_AMBULATORY_CARE_PROVIDER_SITE_OTHER): Payer: Medicare Other | Admitting: Family Medicine

## 2020-04-10 ENCOUNTER — Other Ambulatory Visit: Payer: Self-pay

## 2020-04-10 ENCOUNTER — Encounter: Payer: Self-pay | Admitting: Family Medicine

## 2020-04-10 VITALS — BP 124/84 | HR 80 | Temp 98.1°F | Resp 15 | Ht <= 58 in | Wt 131.5 lb

## 2020-04-10 DIAGNOSIS — F339 Major depressive disorder, recurrent, unspecified: Secondary | ICD-10-CM

## 2020-04-10 DIAGNOSIS — J302 Other seasonal allergic rhinitis: Secondary | ICD-10-CM

## 2020-04-10 DIAGNOSIS — E785 Hyperlipidemia, unspecified: Secondary | ICD-10-CM | POA: Diagnosis not present

## 2020-04-10 DIAGNOSIS — E039 Hypothyroidism, unspecified: Secondary | ICD-10-CM

## 2020-04-10 DIAGNOSIS — N1831 Chronic kidney disease, stage 3a: Secondary | ICD-10-CM

## 2020-04-10 DIAGNOSIS — R739 Hyperglycemia, unspecified: Secondary | ICD-10-CM | POA: Diagnosis not present

## 2020-04-10 DIAGNOSIS — N3946 Mixed incontinence: Secondary | ICD-10-CM

## 2020-04-10 DIAGNOSIS — D696 Thrombocytopenia, unspecified: Secondary | ICD-10-CM

## 2020-04-10 DIAGNOSIS — D692 Other nonthrombocytopenic purpura: Secondary | ICD-10-CM

## 2020-04-10 DIAGNOSIS — H04203 Unspecified epiphora, bilateral lacrimal glands: Secondary | ICD-10-CM

## 2020-04-10 DIAGNOSIS — I428 Other cardiomyopathies: Secondary | ICD-10-CM | POA: Diagnosis not present

## 2020-04-10 DIAGNOSIS — M810 Age-related osteoporosis without current pathological fracture: Secondary | ICD-10-CM | POA: Diagnosis not present

## 2020-04-10 DIAGNOSIS — I5020 Unspecified systolic (congestive) heart failure: Secondary | ICD-10-CM | POA: Diagnosis not present

## 2020-04-10 DIAGNOSIS — K219 Gastro-esophageal reflux disease without esophagitis: Secondary | ICD-10-CM

## 2020-04-10 DIAGNOSIS — I209 Angina pectoris, unspecified: Secondary | ICD-10-CM

## 2020-04-10 MED ORDER — FLUTICASONE PROPIONATE 50 MCG/ACT NA SUSP: NASAL | 1 refills | Status: DC

## 2020-04-10 MED ORDER — SERTRALINE HCL 100 MG PO TABS: 100.0000 mg | ORAL_TABLET | Freq: Every day | ORAL | 1 refills | Status: DC

## 2020-04-11 LAB — CBC WITH DIFFERENTIAL/PLATELET
Absolute Monocytes: 311 cells/uL (ref 200–950)
Basophils Absolute: 42 cells/uL (ref 0–200)
Basophils Relative: 1 %
Eosinophils Absolute: 109 cells/uL (ref 15–500)
Eosinophils Relative: 2.6 %
HCT: 39.4 % (ref 35.0–45.0)
Hemoglobin: 13.1 g/dL (ref 11.7–15.5)
Lymphs Abs: 1369 cells/uL (ref 850–3900)
MCH: 32.1 pg (ref 27.0–33.0)
MCHC: 33.2 g/dL (ref 32.0–36.0)
MCV: 96.6 fL (ref 80.0–100.0)
MPV: 11.6 fL (ref 7.5–12.5)
Monocytes Relative: 7.4 %
Neutro Abs: 2369 cells/uL (ref 1500–7800)
Neutrophils Relative %: 56.4 %
Platelets: 141 10*3/uL (ref 140–400)
RBC: 4.08 10*6/uL (ref 3.80–5.10)
RDW: 12.6 % (ref 11.0–15.0)
Total Lymphocyte: 32.6 %
WBC: 4.2 10*3/uL (ref 3.8–10.8)

## 2020-04-11 LAB — LIPID PANEL
Cholesterol: 133 mg/dL (ref <200)
HDL: 51 mg/dL (ref >=50)
LDL Cholesterol (Calc): 64 mg/dL (calc)
Non-HDL Cholesterol (Calc): 82 mg/dL (calc) (ref <130)
Total CHOL/HDL Ratio: 2.6 (calc) (ref <5.0)
Triglycerides: 92 mg/dL (ref <150)

## 2020-04-11 LAB — HEMOGLOBIN A1C
Hgb A1c MFr Bld: 6 % of total Hgb — ABNORMAL HIGH (ref <5.7)
Mean Plasma Glucose: 126 (calc)
eAG (mmol/L): 7 (calc)

## 2020-04-11 LAB — COMPLETE METABOLIC PANEL WITH GFR
AG Ratio: 1.9 (calc) (ref 1.0–2.5)
ALT: 34 U/L — ABNORMAL HIGH (ref 6–29)
AST: 44 U/L — ABNORMAL HIGH (ref 10–35)
Albumin: 4.2 g/dL (ref 3.6–5.1)
Alkaline phosphatase (APISO): 110 U/L (ref 37–153)
BUN: 22 mg/dL (ref 7–25)
CO2: 27 mmol/L (ref 20–32)
Calcium: 9.5 mg/dL (ref 8.6–10.4)
Chloride: 110 mmol/L (ref 98–110)
Creat: 0.9 mg/dL (ref 0.60–0.93)
GFR, Est African American: 72 mL/min/{1.73_m2} (ref >=60)
GFR, Est Non African American: 63 mL/min/{1.73_m2} (ref >=60)
Globulin: 2.2 g/dL (calc) (ref 1.9–3.7)
Glucose, Bld: 99 mg/dL (ref 65–99)
Potassium: 5 mmol/L (ref 3.5–5.3)
Sodium: 144 mmol/L (ref 135–146)
Total Bilirubin: 0.4 mg/dL (ref 0.2–1.2)
Total Protein: 6.4 g/dL (ref 6.1–8.1)

## 2020-04-11 LAB — TSH: TSH: 0.25 mIU/L — ABNORMAL LOW (ref 0.40–4.50)

## 2020-04-11 LAB — VITAMIN D 25 HYDROXY (VIT D DEFICIENCY, FRACTURES): Vit D, 25-Hydroxy: 71 ng/mL (ref 30–100)

## 2020-04-12 ENCOUNTER — Other Ambulatory Visit: Payer: Self-pay | Admitting: Family Medicine

## 2020-04-12 DIAGNOSIS — E039 Hypothyroidism, unspecified: Secondary | ICD-10-CM

## 2020-04-12 DIAGNOSIS — F339 Major depressive disorder, recurrent, unspecified: Secondary | ICD-10-CM

## 2020-04-12 NOTE — Telephone Encounter (Signed)
Requested Prescriptions  Pending Prescriptions Disp Refills  . levothyroxine (SYNTHROID) 88 MCG tablet [Pharmacy Med Name: LEVOTHYROXINE 88 MCG TABLET] 90 tablet 0    Sig: TAKE 1 TABLET BY MOUTH EVERY DAY-NEED TO CALL MD OFFICE TO SCHEDULE AN APPOINTMENT     Endocrinology:  Hypothyroid Agents Failed - 04/12/2020  9:15 AM      Failed - TSH needs to be rechecked within 3 months after an abnormal result. Refill until TSH is due.      Failed - TSH in normal range and within 360 days    TSH  Date Value Ref Range Status  04/10/2020 0.25 (L) 0.40 - 4.50 mIU/L Final         Passed - Valid encounter within last 12 months    Recent Outpatient Visits          2 days ago Senile purpura [facility])   [facility] Steele Sizer, MD   11 months ago GERD without esophagitis   [facility] Steele Sizer, MD   1 year ago Major depression, recurrent, chronic [facility])   [facility] Steele Sizer, MD   1 year ago Adult hypothyroidism   [facility] Steele Sizer, MD   1 year ago Closed fracture of distal end of right fibula, unspecified fracture morphology, initial encounter   [facility] Delsa Grana, PA-C      Future Appointments            In 6 months Steele Sizer, MD [facility] [place]           . sertraline (ZOLOFT) 50 MG tablet [Pharmacy Med Name: SERTRALINE HCL 50 MG TABLET] 30 tablet 0    Sig: TAKE 1 TABLET BY MOUTH EVERY DAY     Psychiatry:  Antidepressants - SSRI Passed - 04/12/2020  9:15 AM      Passed - Completed PHQ-2 or PHQ-9 in the last 360 days      Passed - Valid encounter within last 6 months    Recent Outpatient Visits          2 days ago Senile purpura [facility])   [facility] Steele Sizer, MD   11 months ago GERD without esophagitis   [facility] Steele Sizer, MD   1 year ago Major depression, recurrent, chronic  [facility])   [facility] Steele Sizer, MD   1 year ago Adult hypothyroidism   [facility] Steele Sizer, MD   1 year ago Closed fracture of distal end of right fibula, unspecified fracture morphology, initial encounter   [facility] Delsa Grana, PA-C      Future Appointments            In 6 months Steele Sizer, MD [facility], [facility]

## 2020-05-05 ENCOUNTER — Ambulatory Visit (INDEPENDENT_AMBULATORY_CARE_PROVIDER_SITE_OTHER): Payer: Medicare Other

## 2020-05-05 ENCOUNTER — Other Ambulatory Visit: Payer: Self-pay

## 2020-05-05 DIAGNOSIS — I4891 Unspecified atrial fibrillation: Secondary | ICD-10-CM

## 2020-05-05 DIAGNOSIS — Z5181 Encounter for therapeutic drug level monitoring: Secondary | ICD-10-CM | POA: Diagnosis not present

## 2020-05-05 LAB — POCT INR: INR: 2.4 (ref 2.0–3.0)

## 2020-05-05 NOTE — Patient Instructions (Signed)
-   continue dosage of warfarin 1 (2.5 mg) tablet every day EXCEPT 1.5 tablets on [place]. - recheck in 6 weeks

## 2020-05-15 ENCOUNTER — Other Ambulatory Visit: Payer: Self-pay | Admitting: Family Medicine

## 2020-05-15 ENCOUNTER — Other Ambulatory Visit: Payer: Self-pay | Admitting: Cardiovascular Disease

## 2020-05-15 DIAGNOSIS — E039 Hypothyroidism, unspecified: Secondary | ICD-10-CM

## 2020-06-16 ENCOUNTER — Other Ambulatory Visit: Payer: Self-pay | Admitting: Cardiovascular Disease

## 2020-06-17 ENCOUNTER — Other Ambulatory Visit: Payer: Self-pay | Admitting: Family Medicine

## 2020-06-17 ENCOUNTER — Other Ambulatory Visit: Payer: Self-pay

## 2020-06-17 ENCOUNTER — Telehealth: Payer: Self-pay | Admitting: *Deleted

## 2020-06-17 ENCOUNTER — Ambulatory Visit (INDEPENDENT_AMBULATORY_CARE_PROVIDER_SITE_OTHER): Payer: Medicare Other

## 2020-06-17 VITALS — BP 124/84 | HR 80 | Temp 97.4°F | Resp 16 | Ht <= 58 in | Wt 133.2 lb

## 2020-06-17 DIAGNOSIS — I5020 Unspecified systolic (congestive) heart failure: Secondary | ICD-10-CM | POA: Diagnosis not present

## 2020-06-17 DIAGNOSIS — I428 Other cardiomyopathies: Secondary | ICD-10-CM

## 2020-06-17 DIAGNOSIS — E039 Hypothyroidism, unspecified: Secondary | ICD-10-CM

## 2020-06-17 DIAGNOSIS — N1831 Chronic kidney disease, stage 3a: Secondary | ICD-10-CM

## 2020-06-17 DIAGNOSIS — Z0100 Encounter for examination of eyes and vision without abnormal findings: Secondary | ICD-10-CM

## 2020-06-17 DIAGNOSIS — Z0000 Encounter for general adult medical examination without abnormal findings: Secondary | ICD-10-CM

## 2020-06-17 DIAGNOSIS — M810 Age-related osteoporosis without current pathological fracture: Secondary | ICD-10-CM

## 2020-06-17 LAB — TSH: TSH: 0.15 mIU/L — ABNORMAL LOW (ref 0.40–4.50)

## 2020-06-17 NOTE — Chronic Care Management (AMB) (Signed)
  Chronic Care Management   Outreach Note  06/17/2020 Name: Felicia Poole MRN: [id number] DOB: 1945/10/29  Felicia Poole is a 75 y.o. year old female who is a primary care patient of Steele Sizer, MD. I reached out to [facility] by phone today in response to a referral sent by [name]'s patient's AWV (annual wellness visit) nurse, Clemetine Marker LPN.      An unsuccessful telephone outreach was attempted today. The patient was referred to the case management team for assistance with care management and care coordination.   Follow Up Plan: A HIPAA compliant phone message was left for the patient providing contact information and requesting a return call. The care management team will reach out to the patient again over the next 7 days. If patient returns call to provider office, please advise to call [place] at [phone].  [facility]

## 2020-06-17 NOTE — Patient Instructions (Signed)
Felicia Poole , Thank you for taking time to come for your Medicare Wellness Visit. I appreciate your ongoing commitment to your health goals. Please review the following plan we discussed and let me know if I can assist you in the future.   Screening recommendations/referrals: Colonoscopy: done 08/30/12. Mammogram: done 08/06/14. Please call [phone] to schedule your mammogram and bone density screening.  Bone Density: done 11/22/12 Recommended yearly ophthalmology/optometry visit for glaucoma screening and checkup Recommended yearly dental visit for hygiene and checkup  Vaccinations: Influenza vaccine: due Pneumococcal vaccine: done 04/29/15 Tdap vaccine: done 04/28/12 Shingles vaccine: Shingrix discussed. Please contact your pharmacy for coverage information.  Covid-19:declined  Advanced directives: Advance directive discussed with you today. I have provided a copy for you to complete at home and have notarized. Once this is complete please bring a copy in to our office so we can scan it into your chart.  Conditions/risks identified: Recommend healthy eating and physical activity   Next appointment: Follow up in one year for your annual wellness visit    Preventive Care 65 Years and Older, Female Preventive care refers to lifestyle choices and visits with your health care provider that can promote health and wellness. What does preventive care include?  A yearly physical exam. This is also called an annual well check.  Dental exams once or twice a year.  Routine eye exams. Ask your health care provider how often you should have your eyes checked.  Personal lifestyle choices, including:  Daily care of your teeth and gums.  Regular physical activity.  Eating a healthy diet.  Avoiding tobacco and drug use.  Limiting alcohol use.  Practicing safe sex.  Taking low-dose aspirin every day.  Taking vitamin and mineral supplements as recommended by your health care  provider. What happens during an annual well check? The services and screenings done by your health care provider during your annual well check will depend on your age, overall health, lifestyle risk factors, and family history of disease. Counseling  Your health care provider may ask you questions about your:  Alcohol use.  Tobacco use.  Drug use.  Emotional well-being.  Home and relationship well-being.  Sexual activity.  Eating habits.  History of falls.  Memory and ability to understand (cognition).  Work and work Statistician.  Reproductive health. Screening  You may have the following tests or measurements:  Height, weight, and BMI.  Blood pressure.  Lipid and cholesterol levels. These may be checked every 5 years, or more frequently if you are over 98 years old.  Skin check.  Lung cancer screening. You may have this screening every year starting at age 3 if you have a 30-pack-year history of smoking and currently smoke or have quit within the past 15 years.  Fecal occult blood test (FOBT) of the stool. You may have this test every year starting at age 29.  Flexible sigmoidoscopy or colonoscopy. You may have a sigmoidoscopy every 5 years or a colonoscopy every 10 years starting at age 82.  Hepatitis C blood test.  Hepatitis B blood test.  Sexually transmitted disease (STD) testing.  Diabetes screening. This is done by checking your blood sugar (glucose) after you have not eaten for a while (fasting). You may have this done every 1-3 years.  Bone density scan. This is done to screen for osteoporosis. You may have this done starting at age 23.  Mammogram. This may be done every 1-2 years. Talk to your health care provider about how often  you should have regular mammograms. Talk with your health care provider about your test results, treatment options, and if necessary, the need for more tests. Vaccines  Your health care provider may recommend certain  vaccines, such as:  Influenza vaccine. This is recommended every year.  Tetanus, diphtheria, and acellular pertussis (Tdap, Td) vaccine. You may need a Td booster every 10 years.  Zoster vaccine. You may need this after age 25.  Pneumococcal 13-valent conjugate (PCV13) vaccine. One dose is recommended after age 55.  Pneumococcal polysaccharide (PPSV23) vaccine. One dose is recommended after age 2. Talk to your health care provider about which screenings and vaccines you need and how often you need them. This information is not intended to replace advice given to you by your health care provider. Make sure you discuss any questions you have with your health care provider. Document Released: 06/20/2015 Document Revised: 02/11/2016 Document Reviewed: 03/25/2015 Elsevier Interactive Patient Education  2017 [employer] Prevention in the Home Falls can cause injuries. They can happen to people of all ages. There are many things you can do to make your home safe and to help prevent falls. What can I do on the outside of my home?  Regularly fix the edges of walkways and driveways and fix any cracks.  Remove anything that might make you trip as you walk through a door, such as a raised step or threshold.  Trim any bushes or trees on the path to your home.  Use bright outdoor lighting.  Clear any walking paths of anything that might make someone trip, such as rocks or tools.  Regularly check to see if handrails are loose or broken. Make sure that both sides of any steps have handrails.  Any raised decks and porches should have guardrails on the edges.  Have any leaves, snow, or ice cleared regularly.  Use sand or salt on walking paths during winter.  Clean up any spills in your garage right away. This includes oil or grease spills. What can I do in the bathroom?  Use night lights.  Install grab bars by the toilet and in the tub and shower. Do not use towel bars as grab  bars.  Use non-skid mats or decals in the tub or shower.  If you need to sit down in the shower, use a plastic, non-slip stool.  Keep the floor dry. Clean up any water that spills on the floor as soon as it happens.  Remove soap buildup in the tub or shower regularly.  Attach bath mats securely with double-sided non-slip rug tape.  Do not have throw rugs and other things on the floor that can make you trip. What can I do in the bedroom?  Use night lights.  Make sure that you have a light by your bed that is easy to reach.  Do not use any sheets or blankets that are too big for your bed. They should not hang down onto the floor.  Have a firm chair that has side arms. You can use this for support while you get dressed.  Do not have throw rugs and other things on the floor that can make you trip. What can I do in the kitchen?  Clean up any spills right away.  Avoid walking on wet floors.  Keep items that you use a lot in easy-to-reach places.  If you need to reach something above you, use a strong step stool that has a grab bar.  Keep electrical cords  out of the way.  Do not use floor polish or wax that makes floors slippery. If you must use wax, use non-skid floor wax.  Do not have throw rugs and other things on the floor that can make you trip. What can I do with my stairs?  Do not leave any items on the stairs.  Make sure that there are handrails on both sides of the stairs and use them. Fix handrails that are broken or loose. Make sure that handrails are as long as the stairways.  Check any carpeting to make sure that it is firmly attached to the stairs. Fix any carpet that is loose or worn.  Avoid having throw rugs at the top or bottom of the stairs. If you do have throw rugs, attach them to the floor with carpet tape.  Make sure that you have a light switch at the top of the stairs and the bottom of the stairs. If you do not have them, ask someone to add them for  you. What else can I do to help prevent falls?  Wear shoes that:  Do not have high heels.  Have rubber bottoms.  Are comfortable and fit you well.  Are closed at the toe. Do not wear sandals.  If you use a stepladder:  Make sure that it is fully opened. Do not climb a closed stepladder.  Make sure that both sides of the stepladder are locked into place.  Ask someone to hold it for you, if possible.  Clearly mark and make sure that you can see:  Any grab bars or handrails.  First and last steps.  Where the edge of each step is.  Use tools that help you move around (mobility aids) if they are needed. These include:  Canes.  Walkers.  Scooters.  Crutches.  Turn on the lights when you go into a dark area. Replace any light bulbs as soon as they burn out.  Set up your furniture so you have a clear path. Avoid moving your furniture around.  If any of your floors are uneven, fix them.  If there are any pets around you, be aware of where they are.  Review your medicines with your doctor. Some medicines can make you feel dizzy. This can increase your chance of falling. Ask your doctor what other things that you can do to help prevent falls. This information is not intended to replace advice given to you by your health care provider. Make sure you discuss any questions you have with your health care provider. Document Released: 03/20/2009 Document Revised: 10/30/2015 Document Reviewed: 06/28/2014 Elsevier Interactive Patient Education  2017 [employer].

## 2020-06-17 NOTE — Progress Notes (Signed)
Subjective:   Felicia Poole is a 75 y.o. female who presents for Medicare Annual (Subsequent) preventive examination.  Review of Systems     Cardiac Risk Factors include: advanced age (>47men, >54 women);dyslipidemia;hypertension     Objective:    Today's Vitals   06/17/20 1129  BP: 124/84  Pulse: 80  Resp: 16  Temp: (!) 97.4 F (36.3 C)  TempSrc: Oral  SpO2: 99%  Weight: 133 lb 3.2 oz (60.4 kg)  Height: 4\' 9"  (1.448 m)   Body mass index is 28.82 kg/m.  Advanced Directives 06/17/2020 01/24/2018 01/24/2017 10/26/2016 10/06/2015 05/08/2015 04/29/2015  Does Patient Have a Medical Advance Directive? No No No No No No No  Would patient like information on creating a medical advance directive? Yes (MAU/Ambulatory/Procedural Areas - Information given) Yes (MAU/Ambulatory/Procedural Areas - Information given) Yes (ED - Information included in AVS) - No - patient declined information No - patient declined information -    Current Medications (verified) Outpatient Encounter Medications as of 06/17/2020  Medication Sig  . alendronate (FOSAMAX) 70 MG tablet TAKE 1 TABLET (70 MG TOTAL) BY MOUTH ONCE A WEEK. WITH FULL GLASS OF WATER AND DO NOT RECLINE  . fluticasone (FLONASE) 50 MCG/ACT nasal spray SPRAY 2 SPRAYS IN EACH NOSTRIL AT BEDTIME  . furosemide (LASIX) 40 MG tablet TAKE 1 TABLET (40 MG TOTAL) BY MOUTH AS NEEDED.  . Incontinence Supply Disposable ([facility] BLADDER CONTROL PAD) MISC 1 each by Does not apply route daily.  Marland Kitchen levothyroxine (SYNTHROID) 88 MCG tablet TAKE 1 TABLET BY MOUTH EVERY DAY-NEED TO CALL MD OFFICE TO SCHEDULE AN APPOINTMENT  . OS-CAL CALCIUM + D3 500-200 MG-UNIT TABS TAKE 1 TABLET BY MOUTH EVERY DAY  . sertraline (ZOLOFT) 100 MG tablet Take 1 tablet (100 mg total) by mouth daily.  . sotalol (BETAPACE) 80 MG tablet TAKE 1 TABLET (80 MG TOTAL) BY MOUTH 2 (TWO) TIMES DAILY.  Marland Kitchen Vitamin D, Ergocalciferol, (DRISDOL) 1.25 MG (50000 UNIT) CAPS capsule TAKE 1 CAPSULE (50,000  UNITS TOTAL) BY MOUTH EVERY 7 (SEVEN) DAYS.  Marland Kitchen warfarin (COUMADIN) 2.5 MG tablet TAKE AS DIRECTED BY THE ANTI-COAG CLINIC  . amLODipine (NORVASC) 10 MG tablet Take 1 tablet (10 mg total) by mouth daily. (Patient not taking: Reported on 06/17/2020)  . atorvastatin (LIPITOR) 80 MG tablet TAKE 1 TABLET BY MOUTH EVERY DAY (Patient not taking: Reported on 06/17/2020)  . chlorthalidone (HYGROTON) 25 MG tablet Take 1 tablet (25 mg total) by mouth daily. (Patient not taking: Reported on 06/17/2020)  . lisinopril (PRINIVIL,ZESTRIL) 20 MG tablet TAKE 1 TABLET (20 MG TOTAL) BY MOUTH DAILY. (Patient not taking: Reported on 06/17/2020)  . [DISCONTINUED] furosemide (LASIX) 40 MG tablet Take 1 tablet (40 mg total) by mouth as needed.  . [DISCONTINUED] Gauze Pads & Dressings (ABDOMINAL PAD) [id number]" PADS 1 application by Does not apply route 4 (four) times daily.  . [DISCONTINUED] nystatin (MYCOSTATIN/NYSTOP) powder Apply topically 2 (two) times daily.   No facility-administered encounter medications on file as of 06/17/2020.    Allergies (verified) Patient has no known allergies.   History: Past Medical History:  Diagnosis Date  . Atrial myxoma    a. 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  . Benign neoplasm of heart   . Contact dermatitis and other eczema, due to unspecified cause   . Contusion of unspecified site   . Coronary atherosclerosis of unspecified type of vessel, native or graft    a. 08/2010 s/p CABG x 1 (LIMA->LAD @ time of atrial myxoma  resection [facility]); b. 01/2013 MV: No ischemia/infarct. EF 71%.  . Cramp of limb   . Depression   . Essential hypertension, benign   . Heartburn   . Hyperlipidemia LDL goal <70   . Lumbago   . Nonischemic cardiomyopathy ([place])    a. EF: 45% in 2012; b. 01/2013 Echo: EF 45-50%, mild diff HK. Mild to mod MR. Mild BAE. Mod TR.  . Osteoporosis, unspecified   . Other specified cardiac dysrhythmias(427.89)   . PAF (paroxysmal atrial fibrillation) (HCC)    a. CHA2DS2VASc =  6-->warfarin.  Rhythm management with sotalol.  Marland Kitchen Postsurgical percutaneous transluminal coronary angioplasty status   . Tachy-brady syndrome ([place])    a. 02/2013 s/p MDT [id number] Advisa DR MRI DC PPM.  . Type II or unspecified type diabetes mellitus without mention of complication, not stated as uncontrolled   . Unspecified hypothyroidism   . Unspecified menopausal and postmenopausal disorder    Past Surgical History:  Procedure Laterality Date  . ATRIAL MYXOMA EXCISION Left   . CARDIAC CATHETERIZATION  2012   [facility]; right and left heart 75% ostial LAD lesion  . CORONARY ARTERY BYPASS GRAFT  2011   [facility]  . INSERT / REPLACE / REMOVE PACEMAKER  02/2013   Dual chamber MDT advisa pacemaker. [id number]  . VAGINAL HYSTERECTOMY     Family History  Problem Relation Age of Onset  . Heart failure Mother   . Cirrhosis Mother   . Hyperlipidemia Father   . Hypertension Father   . Stroke Father   . Heart attack Father    Social History   Socioeconomic History  . Marital status: Widowed    Spouse name: Felicia Poole  . Number of children: 5  . Years of education: Not on file  . Highest education level: 9th grade  Occupational History    Employer: DISABLED  Tobacco Use  . Smoking status: Never Smoker  . Smokeless tobacco: Never Used  . Tobacco comment: smoking cessation materials not required  Vaping Use  . Vaping Use: Never used  Substance and Sexual Activity  . Alcohol use: No  . Drug use: No  . Sexual activity: Never  Other Topics Concern  . Not on file  Social History Narrative   Pt lives with son, daughter [name], son in law, granddaughter   Social Determinants of Health   Financial Resource Strain: Low Risk   . Difficulty of Paying Living Expenses: Not hard at all  Food Insecurity: No Food Insecurity  . Worried About Charity fundraiser in the Last Year: Never true  . Ran Out of Food in the Last Year: Never true  Transportation Needs: No Transportation Needs  . Lack of  Transportation (Medical): No  . Lack of Transportation (Non-Medical): No  Physical Activity: Insufficiently Active  . Days of Exercise per Week: 7 days  . Minutes of Exercise per Session: 10 min  Stress: No Stress Concern Present  . Feeling of Stress : Only a little  Social Connections: Socially Isolated  . Frequency of Communication with Friends and Family: More than three times a week  . Frequency of Social Gatherings with Friends and Family: More than three times a week  . Attends Religious Services: Never  . Active Member of Clubs or Organizations: No  . Attends Archivist Meetings: Never  . Marital Status: Widowed    Tobacco Counseling Counseling given: Not Answered Comment: smoking cessation materials not required   Clinical Intake:  Pre-visit preparation completed: Yes  Pain : No/denies pain     BMI - recorded: 28.82 Nutritional Status: BMI 25 -29 Overweight Nutritional Risks: None Diabetes: No  How often do you need to have someone help you when you read instructions, pamphlets, or other written materials from your doctor or pharmacy?: 1 - Never    Interpreter Needed?: No  Information entered by :: Clemetine Marker LPN   Activities of Daily Living In your present state of health, do you have any difficulty performing the following activities: 06/17/2020 04/10/2020  Hearing? N N  Comment declines hearing aids -  Vision? N N  Difficulty concentrating or making decisions? N N  Walking or climbing stairs? N Y  Dressing or bathing? N N  Doing errands, shopping? Y Y  Comment pt does not drive Facilities manager and eating ? N -  Using the Toilet? N -  In the past six months, have you accidently leaked urine? Y -  Comment wears pads/depends for protection -  Do you have problems with loss of bowel control? N -  Managing your Medications? N -  Managing your Finances? N -  Housekeeping or managing your Housekeeping? N -  Some recent data might be hidden     Patient Care Team: Steele Sizer, MD as PCP - General (Family Medicine) Deboraha Sprang, MD as PCP - Cardiology (Cardiology) Deboraha Sprang, MD as Consulting Physician (Cardiology) Gardiner Barefoot, DPM as Consulting Physician (Podiatry)  Indicate any recent Medical Services you may have received from other than [facility] providers in the past year (date may be approximate).     Assessment:   This is a routine wellness examination for [facility].  Hearing/Vision screen  Hearing Screening   125Hz  250Hz  500Hz  1000Hz  2000Hz  3000Hz  4000Hz  6000Hz  8000Hz   Right ear:           Left ear:           Comments: Pt denies hearing difficulty  Vision Screening Comments: Pt c/o watery eyes at times, past due for eye exam. Referral sent to [facility] today   Dietary issues and exercise activities discussed: Current Exercise Habits: Home exercise routine, Type of exercise: walking, Time (Minutes): 10, Frequency (Times/Week): 7, Weekly Exercise (Minutes/Week): 70, Intensity: Mild, Exercise limited by: cardiac condition(s)  Goals    . Cut out extra servings     Recommend cutting out extra servings and snacking in between meals.     Marland Kitchen DIET - INCREASE WATER INTAKE     Recommend to drink at least 6-8 8oz glasses of water per day.      Depression Screen PHQ 2/9 Scores 06/17/2020 04/10/2020 04/26/2019 03/16/2019 02/05/2019 01/26/2019 01/24/2018  PHQ - 2 Score 2 2 0 1 4 3  0  PHQ- 9 Score 3 6 0 4 12 8  0    Fall Risk Fall Risk  06/17/2020 04/10/2020 04/10/2020 04/26/2019 02/05/2019  Falls in the past year? 0 0 0 0 1  Comment - - - - -  Number falls in past yr: 0 0 0 0 1  Injury with Fall? 0 0 0 0 1  Risk for fall due to : No Fall Risks - - - -  Risk for fall due to: Comment - - - - -  Follow up Falls prevention discussed - - - -    FALL RISK PREVENTION PERTAINING TO THE HOME:  Any stairs in or around the home? Yes  If so, are there any without handrails? Yes - outside steps Home free  of loose throw rugs  in walkways, pet beds, electrical cords, etc? Yes  Adequate lighting in your home to reduce risk of falls? Yes   ASSISTIVE DEVICES UTILIZED TO PREVENT FALLS:  Life alert? No  Use of a cane, walker or w/c? No  Grab bars in the bathroom? No  Shower chair or bench in shower? Yes  Elevated toilet seat or a handicapped toilet? No   TIMED UP AND GO:  Was the test performed? Yes .  Length of time to ambulate 10 feet: 6 sec.   Gait steady and fast without use of assistive device  Cognitive Function:     6CIT Screen 06/17/2020 01/24/2018  What Year? 4 points 0 points  What month? 0 points 0 points  What time? 0 points 0 points  Count back from 20 2 points 2 points  Months in reverse 4 points 4 points  Repeat phrase 8 points 8 points  Total Score 18 14    Immunizations Immunization History  Administered Date(s) Administered  . Fluad Quad(high Dose 65+) 02/05/2019  . Influenza, High Dose Seasonal PF 04/29/2015  . Pneumococcal Conjugate-13 04/29/2015  . Pneumococcal Polysaccharide-23 02/17/2011  . Tdap 04/28/2012    TDAP status: Up to date  Flu Vaccine status: Declined, Education has been provided regarding the importance of this vaccine but patient still declined. Advised may receive this vaccine at local pharmacy or Health Dept. Aware to provide a copy of the vaccination record if obtained from local pharmacy or Health Dept. Verbalized acceptance and understanding.  Pneumococcal vaccine status: Up to date  Covid-19 vaccine status: Declined, Education has been provided regarding the importance of this vaccine but patient still declined. Advised may receive this vaccine at local pharmacy or Health Dept.or vaccine clinic. Aware to provide a copy of the vaccination record if obtained from local pharmacy or Health Dept. Verbalized acceptance and understanding.  Qualifies for Shingles Vaccine? Yes   Zostavax completed No   Shingrix Completed?: No.    Education has been provided  regarding the importance of this vaccine. Patient has been advised to call insurance company to determine out of pocket expense if they have not yet received this vaccine. Advised may also receive vaccine at local pharmacy or Health Dept. Verbalized acceptance and understanding.  Screening Tests Health Maintenance  Topic Date Due  . COVID-19 Vaccine (1) Never done  . INFLUENZA VACCINE  09/04/2020 (Originally 01/06/2020)  . MAMMOGRAM  03/25/2021 (Originally 03/16/1964)  . TETANUS/TDAP  04/28/2022  . COLONOSCOPY (Pts 45-78yrs Insurance coverage will need to be confirmed)  08/31/2022  . DEXA SCAN  Completed  . Hepatitis C Screening  Completed  . PNA vac Low Risk Adult  Completed    Health Maintenance  Health Maintenance Due  Topic Date Due  . COVID-19 Vaccine (1) Never done    Colorectal cancer screening: Type of screening: Colonoscopy. Completed 08/30/12. Repeat every 10 years  Mammogram status: Completed 2016. Repeat every year. Ordered 04/01/20  Bone Density status: Completed 11/22/12. Results reflect: Bone density results: OSTEOPOROSIS. Repeat every 2 years. Ordered 04/01/20.   Lung Cancer Screening: (Low Dose CT Chest recommended if Age 61-80 years, 30 pack-year currently smoking OR have quit w/in 15years.) does not qualify.   Additional Screening:  Hepatitis C Screening: does qualify; Completed 07/11/12  Vision Screening: Recommended annual ophthalmology exams for early detection of glaucoma and other disorders of the eye. Is the patient up to date with their annual eye exam?  No  Who is the provider  or what is the name of the office in which the patient attends annual eye exams? Not established If pt is not established with a provider, would they like to be referred to a provider to establish care? Yes .   Dental Screening: Recommended annual dental exams for proper oral hygiene  Community Resource Referral / Chronic Care Management: CRR required this visit?  No   CCM  required this visit?  Yes      Plan:     I have personally reviewed and noted the following in the patient's chart:   . Medical and social history . Use of alcohol, tobacco or illicit drugs  . Current medications and supplements . Functional ability and status . Nutritional status . Physical activity . Advanced directives . List of other physicians . Hospitalizations, surgeries, and ER visits in previous 12 months . Vitals . Screenings to include cognitive, depression, and falls . Referrals and appointments  In addition, I have reviewed and discussed with patient certain preventive protocols, quality metrics, and best practice recommendations. A written personalized care plan for preventive services as well as general preventive health recommendations were provided to patient.     Clemetine Marker, LPN   [id number]   Nurse Notes: pt accompanied to visit today by her daughter Tilford Pillar. Patient brought in medication bottles from home but did not have lisinopril, chlorthalidone, atorvastatin or amlodipine.   Atorvastatin refilled by Dr. Caryl Comes on 04/01/20. Lisinopril, chlorthalidone & amlodipine last prescribed by Dr. Rockey Situ on 09/19/18. Rx would now be expired.   Pt's TSH at last visit on 04/10/20 abnormal. Reorderd today per Dr. Ancil Boozer and patient taken to lab.  Patient's daughter also requests rx for incontinence supplies to be refilled and sent to [facility] medical supply. She also requested a handicap placard but patient does not have a NCD[place] or [place] id card for paperwork.   Referral sent to [facility] today for follow up regarding medication and disease management.

## 2020-06-18 ENCOUNTER — Ambulatory Visit (INDEPENDENT_AMBULATORY_CARE_PROVIDER_SITE_OTHER): Payer: Medicare Other

## 2020-06-18 ENCOUNTER — Other Ambulatory Visit: Payer: Self-pay | Admitting: Family Medicine

## 2020-06-18 DIAGNOSIS — E039 Hypothyroidism, unspecified: Secondary | ICD-10-CM

## 2020-06-18 DIAGNOSIS — Z5181 Encounter for therapeutic drug level monitoring: Secondary | ICD-10-CM | POA: Diagnosis not present

## 2020-06-18 DIAGNOSIS — I4891 Unspecified atrial fibrillation: Secondary | ICD-10-CM

## 2020-06-18 LAB — POCT INR: INR: 1.8 — AB (ref 2.0–3.0)

## 2020-06-18 MED ORDER — LEVOTHYROXINE SODIUM 75 MCG PO TABS: 75.0000 ug | ORAL_TABLET | Freq: Every day | ORAL | 0 refills | Status: DC

## 2020-06-18 NOTE — Patient Instructions (Signed)
-   take 2 tablets warfarin tonight - continue dosage of warfarin 1 (2.5 mg) tablet every day EXCEPT 1.5 tablets on MONDAYS & FRIDAYS. - recheck in 6 weeks

## 2020-06-23 NOTE — Chronic Care Management (AMB) (Signed)
  Chronic Care Management   Outreach Note  06/23/2020 Name: Nabeeha Badertscher MRN: [id number] DOB: 15-Oct-1945  Aniyha Tate is a 75 y.o. year old female who is a primary care patient of Steele Sizer, MD. I reached out to [facility] by phone today in response to a referral sent by Ms. Maryellen Rivest's patient's AWV (annual wellness visit) nurse, Clemetine Marker LPN.Marland Kitchen      A second unsuccessful telephone outreach was attempted today. The patient was referred to the case management team for assistance with care management and care coordination.   Follow Up Plan: A HIPAA compliant phone message was left for the patient providing contact information and requesting a return call. The care management team will reach out to the patient again over the next 7 days. If patient returns call to provider office, please advise to call [place] at [phone].  [place] Management

## 2020-06-30 NOTE — Chronic Care Management (AMB) (Signed)
  Chronic Care Management   Outreach Note  06/30/2020 Name: Felicia Poole MRN: [id number] DOB: 1946/03/18  Felicia Poole is a 75 y.o. year old female who is a primary care patient of Steele Sizer, MD. I reached out to [facility] by phone today in response to a referral sent by [name]'s patient's AWV (annual wellness visit) nurse, Clemetine Marker , LPN.      Third unsuccessful telephone outreach was attempted today. The patient was referred to the case management team for assistance with care management and care coordination. The patient's primary care provider has been notified of our unsuccessful attempts to make or maintain contact with the patient. The care management team is pleased to engage with this patient at any time in the future should he/she be interested in assistance from the care management team.   Follow Up Plan: A HIPAA compliant phone message was left for the patient providing contact information and requesting a return call. If patient returns call to provider office, please advise to call [place] at [phone].  [place] Management

## 2020-08-02 ENCOUNTER — Other Ambulatory Visit: Payer: Self-pay | Admitting: Family Medicine

## 2020-08-02 DIAGNOSIS — M8000XA Age-related osteoporosis with current pathological fracture, unspecified site, initial encounter for fracture: Secondary | ICD-10-CM

## 2020-08-02 NOTE — Telephone Encounter (Signed)
Requested Prescriptions  Pending Prescriptions Disp Refills  . alendronate (FOSAMAX) 70 MG tablet [Pharmacy Med Name: ALENDRONATE SODIUM 70 MG TAB] 12 tablet 1    Sig: TAKE 1 TABLET (70 MG TOTAL) BY MOUTH ONCE A WEEK. WITH FULL GLASS OF WATER AND DO NOT RECLINE     Endocrinology:  Bisphosphonates Passed - 08/02/2020  9:03 AM      Passed - Ca in normal range and within 360 days    Calcium  Date Value Ref Range Status  04/10/2020 9.5 8.6 - 10.4 mg/dL Final         Passed - Vitamin D in normal range and within 360 days    Vit D, 25-Hydroxy  Date Value Ref Range Status  04/10/2020 71 30 - 100 ng/mL Final    Comment:    Vitamin D Status         25-OH Vitamin D: . Deficiency:                    <20 ng/mL Insufficiency:             20 - 29 ng/mL Optimal:                 > or = 30 ng/mL . For 25-OH Vitamin D testing on patients on  D2-supplementation and patients for whom quantitation  of D2 and D3 fractions is required, the [facility]ureD(TM) 25-OH VIT D, (D2,D3), LC/MS/MS is recommended: order  code [phone] (patients >2yrs). See Note 1 . Note 1 . For additional information, please refer to  http://education.Quest[facility].com/faq/FAQ199  (This link is being provided for informational/ educational purposes only.)          Passed - Valid encounter within last 12 months    Recent Outpatient Visits          3 months ago Senile purpura [facility])   [facility] Steele Sizer, MD   1 year ago GERD without esophagitis   [facility] Steele Sizer, MD   1 year ago Major depression, recurrent, chronic [facility])   [facility] Steele Sizer, MD   1 year ago Adult hypothyroidism   [facility] Steele Sizer, MD   1 year ago Closed fracture of distal end of right fibula, unspecified fracture morphology, initial encounter   [facility] Delsa Grana, PA-C      Future Appointments             In 2 months Steele Sizer, MD [facility] [place]   In 10 months  [facility]

## 2020-08-08 ENCOUNTER — Other Ambulatory Visit: Payer: Self-pay | Admitting: Family Medicine

## 2020-08-08 ENCOUNTER — Other Ambulatory Visit: Payer: Self-pay | Admitting: Internal Medicine

## 2020-08-08 DIAGNOSIS — E039 Hypothyroidism, unspecified: Secondary | ICD-10-CM

## 2020-08-13 ENCOUNTER — Ambulatory Visit (INDEPENDENT_AMBULATORY_CARE_PROVIDER_SITE_OTHER): Payer: Medicare Other

## 2020-08-13 ENCOUNTER — Other Ambulatory Visit: Payer: Self-pay

## 2020-08-13 DIAGNOSIS — I4891 Unspecified atrial fibrillation: Secondary | ICD-10-CM

## 2020-08-13 DIAGNOSIS — Z5181 Encounter for therapeutic drug level monitoring: Secondary | ICD-10-CM | POA: Diagnosis not present

## 2020-08-13 LAB — POCT INR: INR: 1.6 — AB (ref 2.0–3.0)

## 2020-08-13 NOTE — Patient Instructions (Signed)
-   take 2 tablets warfarin tonight & tomorrow, then  - START NEW dosage of warfarin 1 (2.5 mg) tablet every day EXCEPT 1.5 tablets on MONDAYS, [place]. - recheck in 3 weeks

## 2020-08-25 ENCOUNTER — Other Ambulatory Visit: Payer: Self-pay | Admitting: Cardiovascular Disease

## 2020-08-25 ENCOUNTER — Other Ambulatory Visit: Payer: Self-pay | Admitting: Family Medicine

## 2020-08-25 DIAGNOSIS — E039 Hypothyroidism, unspecified: Secondary | ICD-10-CM

## 2020-08-26 ENCOUNTER — Encounter: Payer: Medicare Other | Admitting: Internal Medicine

## 2020-08-26 NOTE — Telephone Encounter (Signed)
Prescription refill request received for warfarin [place]:  Felicia Poole, 03/05/2020 Next INR check: 09/03/2020 Warfarin tablet strength: 2.5 mg

## 2020-08-26 NOTE — Telephone Encounter (Signed)
Please review for refill. Thanks!  

## 2020-09-03 ENCOUNTER — Other Ambulatory Visit: Payer: Self-pay

## 2020-09-03 ENCOUNTER — Ambulatory Visit (INDEPENDENT_AMBULATORY_CARE_PROVIDER_SITE_OTHER): Payer: Medicare Other

## 2020-09-03 DIAGNOSIS — Z5181 Encounter for therapeutic drug level monitoring: Secondary | ICD-10-CM | POA: Diagnosis not present

## 2020-09-03 DIAGNOSIS — I4811 Longstanding persistent atrial fibrillation: Secondary | ICD-10-CM | POA: Diagnosis not present

## 2020-09-03 LAB — POCT INR: INR: 1.6 — AB (ref 2.0–3.0)

## 2020-09-03 NOTE — Patient Instructions (Signed)
-   START NEW dosage of warfarin 1.5 tablet every day. - recheck in 2 weeks

## 2020-09-17 ENCOUNTER — Ambulatory Visit (INDEPENDENT_AMBULATORY_CARE_PROVIDER_SITE_OTHER): Payer: Medicare Other

## 2020-09-17 ENCOUNTER — Other Ambulatory Visit: Payer: Self-pay

## 2020-09-17 DIAGNOSIS — I4891 Unspecified atrial fibrillation: Secondary | ICD-10-CM | POA: Diagnosis not present

## 2020-09-17 DIAGNOSIS — Z5181 Encounter for therapeutic drug level monitoring: Secondary | ICD-10-CM | POA: Diagnosis not present

## 2020-09-17 LAB — POCT INR: INR: 1.8 — AB (ref 2.0–3.0)

## 2020-09-17 MED ORDER — WARFARIN SODIUM 2.5 MG PO TABS: ORAL_TABLET | ORAL | 0 refills | Status: DC

## 2020-09-17 NOTE — Patient Instructions (Signed)
-   START NEW dosage of warfarin 1.5 tablet every day EXCEPT 2 TABLETS ON MONDAYS & FRIDAYS. - recheck in 2 weeks

## 2020-10-02 ENCOUNTER — Other Ambulatory Visit: Payer: Self-pay | Admitting: Cardiovascular Disease

## 2020-10-03 NOTE — Telephone Encounter (Signed)
Please review for refill. Thanks!  

## 2020-10-03 NOTE — Telephone Encounter (Signed)
Wafarin 2.5mg  refill sent 09/17/20 for 120 tabs and no refills; this is a little less than a 90 day supply. The request for 360 tabs is not appropriate at all.

## 2020-10-06 NOTE — Progress Notes (Signed)
Date:  10/07/2020   ID:  Felicia Poole, DOB 1946-05-12, MRN [id number]  Patient Location:  [address] [place] [address]   Provider location:   [facility], [place] office  PCP:  Steele Sizer, MD  Cardiologist:  Arvid Right [facility]  Chief Complaint  Patient presents with  . Follow-up    Patient c/o LE edema and cramping in legs. Medications reviewed by the patient verbally.     History of Present Illness:    Felicia Poole is a 75 y.o. female  past medical history of pacemaker Medtronic   atrial fibrillation , post termination pauses.  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  nonischemic cardiomyopathy with ejection fraction 45%  Medication noncompliance Who presents for follow-up of her history of coronary disease prior bypass pacer  Last seen by myself with telemetry visit April 2020 Seen by Dr. Caryl Comes September 2021  Little bit of SOB, has to stop to recover  Not taking lasix, on last clinic visit, was doing once a week, now none Drinks a lot per family who presents with her today Weight up 5 pounds since 04/2020  Problems with depression, wants PMD to cvhange medication  Takes chlorthalidone daily  No chest pain Very sedentary, no regular exercise program  EKG personally reviewed by myself on todays visit Shows NSR paced rhythm rate 64 bpm  Prior CV studies:   The following studies were reviewed today:  Echocardiogram August 2014 ejection fraction 45 to 50%  Other past medical history reviewed nuclear test March 2014 by Dr. Clayborn Bigness that apparently showed no ischemia and normal left ventricular function.   DATE TEST    8/14 Echo    EF 45-50 %         Husband passed away last summer.  They have been married 50+ years.   Past Medical History:  Diagnosis Date  . Atrial myxoma    a. 2012 s/p resection at [facility].  . Benign neoplasm of heart   .  Contact dermatitis and other eczema, due to unspecified cause   . Contusion of unspecified site   . Coronary atherosclerosis of unspecified type of vessel, native or graft    a. 08/2010 s/p CABG x 1 (LIMA->LAD @ time of atrial myxoma resection ([facility]); b. 01/2013 MV: No ischemia/infarct. EF 71%.  . Cramp of limb   . Depression   . Essential hypertension, benign   . Heartburn   . Hyperlipidemia LDL goal <70   . Lumbago   . Nonischemic cardiomyopathy ([place])    a. EF: 45% in 2012; b. 01/2013 Echo: EF 45-50%, mild diff HK. Mild to mod MR. Mild BAE. Mod TR.  . Osteoporosis, unspecified   . Other specified cardiac dysrhythmias(427.89)   . PAF (paroxysmal atrial fibrillation) (HCC)    a. CHA2DS2VASc = 6-->warfarin.  Rhythm management with sotalol.  Marland Kitchen Postsurgical percutaneous transluminal coronary angioplasty status   . Tachy-brady syndrome ([place])    a. 02/2013 s/p MDT [id number] Advisa DR MRI DC PPM.  . Type II or unspecified type diabetes mellitus without mention of complication, not stated as uncontrolled   . Unspecified hypothyroidism   . Unspecified menopausal and postmenopausal disorder    Past Surgical History:  Procedure Laterality Date  . ATRIAL MYXOMA EXCISION Left   . CARDIAC CATHETERIZATION  2012   Unc; right and left heart 75% ostial LAD lesion  . CORONARY ARTERY BYPASS GRAFT  2011   [facility]  . INSERT / REPLACE / REMOVE PACEMAKER  02/2013   Dual chamber MDT advisa pacemaker. [id number]  . VAGINAL HYSTERECTOMY       Current Meds  Medication Sig  . alendronate (FOSAMAX) 70 MG tablet TAKE 1 TABLET (70 MG TOTAL) BY MOUTH ONCE A WEEK. WITH FULL GLASS OF WATER AND DO NOT RECLINE  . amLODipine (NORVASC) 10 MG tablet Take 1 tablet (10 mg total) by mouth daily.  Marland Kitchen atorvastatin (LIPITOR) 80 MG tablet TAKE 1 TABLET BY MOUTH EVERY DAY  . chlorthalidone (HYGROTON) 25 MG tablet Take 1 tablet (25 mg total) by mouth daily.  . fluticasone (FLONASE) 50 MCG/ACT nasal spray SPRAY 2 SPRAYS IN EACH NOSTRIL AT  BEDTIME  . furosemide (LASIX) 40 MG tablet TAKE 1 TABLET (40 MG TOTAL) BY MOUTH AS NEEDED.  . Incontinence Supply Disposable ([employer] BLADDER CONTROL PAD) MISC 1 each by Does not apply route daily.  Marland Kitchen levothyroxine (SYNTHROID) 75 MCG tablet TAKE 1 TABLET BY MOUTH DAILY BEFORE BREAKFAST.  Marland Kitchen lisinopril (PRINIVIL,ZESTRIL) 20 MG tablet TAKE 1 TABLET (20 MG TOTAL) BY MOUTH DAILY.  Marland Kitchen OS-CAL CALCIUM + D3 500-200 MG-UNIT TABS TAKE 1 TABLET BY MOUTH EVERY DAY  . sertraline (ZOLOFT) 100 MG tablet Take 1 tablet (100 mg total) by mouth daily.  . sotalol (BETAPACE) 80 MG tablet TAKE 1 TABLET (80 MG TOTAL) BY MOUTH 2 (TWO) TIMES DAILY.  Marland Kitchen Vitamin D, Ergocalciferol, (DRISDOL) 1.25 MG (50000 UNIT) CAPS capsule TAKE 1 CAPSULE (50,000 UNITS TOTAL) BY MOUTH EVERY 7 (SEVEN) DAYS.  Marland Kitchen warfarin (COUMADIN) 2.5 MG tablet TAKE AS DIRECTED BY THE ANTI-COAG CLINIC     Allergies:   Patient has no known allergies.   Social History   Tobacco Use  . Smoking status: Never Smoker  . Smokeless tobacco: Never Used  . Tobacco comment: smoking cessation materials not required  Vaping Use  . Vaping Use: Never used  Substance Use Topics  . Alcohol use: No  . Drug use: No     Family Hx: The patient's family history includes Cirrhosis in her mother; Heart attack in her father; Heart failure in her mother; Hyperlipidemia in her father; Hypertension in her father; Stroke in her father.  ROS:   Please see the history of present illness.    Review of Systems  Constitutional: Negative.   Respiratory: Negative.   Cardiovascular: Negative.   Gastrointestinal: Negative.   Musculoskeletal: Negative.   Neurological: Negative.   Psychiatric/Behavioral: Negative.   All other systems reviewed and are negative.    Labs/Other Tests and Data Reviewed:    Recent Labs: 03/04/2020: Magnesium 1.8 04/10/2020: ALT 34; BUN 22; Creat 0.90; Hemoglobin 13.1; Platelets 141; Potassium 5.0; Sodium 144 06/17/2020: TSH 0.15   Recent Lipid  Panel Lab Results  Component Value Date/Time   CHOL 133 04/10/2020 11:04 AM   CHOL 228 (H) 04/29/2015 12:10 PM   TRIG 92 04/10/2020 11:04 AM   HDL 51 04/10/2020 11:04 AM   HDL 46 04/29/2015 12:10 PM   CHOLHDL 2.6 04/10/2020 11:04 AM   LDLCALC 64 04/10/2020 11:04 AM    Wt Readings from Last 3 Encounters:  10/07/20 136 lb (61.7 kg)  06/17/20 133 lb 3.2 oz (60.4 kg)  04/10/20 131 lb 8 oz (59.6 kg)     Exam:    Vital Signs: Vital signs may also be detailed in the HPI BP (!) 145/97 (BP Location: Right Arm, Patient Position: Sitting, Cuff Size: Normal)   Pulse 73  Ht 4\' 11"  (1.499 m)   Wt 136 lb (61.7 kg)   SpO2 97%   BMI 27.47 kg/m   Constitutional:  oriented to person, place, and time. No distress.  HENT:  Head: Grossly normal Eyes:  no discharge. No scleral icterus.  Neck: No JVD, no carotid bruits  Cardiovascular: Regular rate and rhythm, no murmurs appreciated Pulmonary/Chest: Clear to auscultation bilaterally, no wheezes or rails Abdominal: Soft.  no distension.  no tenderness.  Musculoskeletal: Normal range of motion Neurological:  normal muscle tone. Coordination normal. No atrophy Skin: Skin warm and dry Psychiatric: normal affect, pleasant   ASSESSMENT & PLAN:    Sinus node dysfunction (HCC) Has pacemaker, followed by EP stable  Atrial fibrillation, unspecified type ([place]) Maintaining NSR  Cardiac pacemaker in situ Followed by Dr. Caryl Comes  Atrial myxoma Prior surgery 2012  Essential hypertension, benign Blood pressure is well controlled on today's visit. No changes made to the medications.  Coronary artery disease of native artery of native heart with stable angina pectoris ([place]) Currently with no symptoms of angina. No further workup at this time. Continue current medication regimen. Stressed importance of staying on her cholesterol medication  Essential hypertension Elevted, fluid overload today She will continue to take Lasix once a week  Hx of  CABG Myxoma resection, bypass graft  Diastolic CHF Lasix today, tomorrow then once a week Discussed looking for warning signs such as abdominal swelling, leg swelling, worsening shortness of breath, has mild to moderate symptoms on today's visit   Total encounter time more than 25 minutes  Greater than 50% was spent in counseling and coordination of care with the patient   Signed, Ida Rogue, MD  10/07/2020 10:56 AM    [place] Office [address], [place], [place] [address]

## 2020-10-07 ENCOUNTER — Encounter: Payer: Self-pay | Admitting: Cardiovascular Disease

## 2020-10-07 ENCOUNTER — Ambulatory Visit (INDEPENDENT_AMBULATORY_CARE_PROVIDER_SITE_OTHER): Payer: Medicare Other | Admitting: Cardiovascular Disease

## 2020-10-07 ENCOUNTER — Other Ambulatory Visit: Payer: Self-pay

## 2020-10-07 VITALS — BP 145/97 | HR 73 | Ht 59.0 in | Wt 136.0 lb

## 2020-10-07 DIAGNOSIS — I495 Sick sinus syndrome: Secondary | ICD-10-CM

## 2020-10-07 DIAGNOSIS — I2510 Atherosclerotic heart disease of native coronary artery without angina pectoris: Secondary | ICD-10-CM | POA: Diagnosis not present

## 2020-10-07 DIAGNOSIS — I480 Paroxysmal atrial fibrillation: Secondary | ICD-10-CM

## 2020-10-07 DIAGNOSIS — I10 Essential (primary) hypertension: Secondary | ICD-10-CM

## 2020-10-07 DIAGNOSIS — I428 Other cardiomyopathies: Secondary | ICD-10-CM

## 2020-10-07 DIAGNOSIS — I4891 Unspecified atrial fibrillation: Secondary | ICD-10-CM

## 2020-10-07 DIAGNOSIS — Z950 Presence of cardiac pacemaker: Secondary | ICD-10-CM

## 2020-10-07 MED ORDER — FUROSEMIDE 40 MG PO TABS: 40.0000 mg | ORAL_TABLET | ORAL | 1 refills | Status: DC | PRN

## 2020-10-07 MED ORDER — SOTALOL HCL 80 MG PO TABS: 80.0000 mg | ORAL_TABLET | Freq: Two times a day (BID) | ORAL | 2 refills | Status: DC

## 2020-10-07 NOTE — Patient Instructions (Addendum)
Medication Instructions:  Lasix today, lasix again on Thursday Then lasix once a week  If you need a refill on your cardiac medications before your next appointment, please call your pharmacy.    Lab work: No new labs needed  Testing/Procedures: No new testing needed   Follow-Up: At [facility], you and your health needs are our priority.  As part of our continuing mission to provide you with exceptional heart care, we have created designated Provider Care Teams.  These Care Teams include your primary Cardiologist (physician) and Advanced Practice Providers (APPs -  Physician Assistants and Nurse Practitioners) who all work together to provide you with the care you need, when you need it.  . You will need a follow up appointment in 6 months  . Providers on your designated Care Team:   . Murray Hodgkins, NP . Christell Faith, PA-C . Marrianne Mood, PA-C   COVID-19 Vaccine Information can be found at: [email or web address].uk For questions related to vaccine distribution or appointments, please email [email or web address] or call [phone].

## 2020-10-08 ENCOUNTER — Ambulatory Visit (INDEPENDENT_AMBULATORY_CARE_PROVIDER_SITE_OTHER): Payer: Medicare Other

## 2020-10-08 DIAGNOSIS — I4891 Unspecified atrial fibrillation: Secondary | ICD-10-CM | POA: Diagnosis not present

## 2020-10-08 DIAGNOSIS — Z5181 Encounter for therapeutic drug level monitoring: Secondary | ICD-10-CM

## 2020-10-08 LAB — POCT INR: INR: 2 (ref 2.0–3.0)

## 2020-10-08 NOTE — Patient Instructions (Signed)
-   continue dosage of warfarin 1.5 tablet every day EXCEPT 2 TABLETS ON MONDAYS & FRIDAYS. - recheck in 3 weeks

## 2020-10-10 ENCOUNTER — Ambulatory Visit: Payer: Medicare Other | Admitting: Family Medicine

## 2020-11-12 ENCOUNTER — Ambulatory Visit (INDEPENDENT_AMBULATORY_CARE_PROVIDER_SITE_OTHER): Payer: Medicare Other

## 2020-11-12 DIAGNOSIS — I4891 Unspecified atrial fibrillation: Secondary | ICD-10-CM | POA: Diagnosis not present

## 2020-11-12 DIAGNOSIS — Z5181 Encounter for therapeutic drug level monitoring: Secondary | ICD-10-CM | POA: Diagnosis not present

## 2020-11-12 LAB — POCT INR: INR: 2.1 (ref 2.0–3.0)

## 2020-11-12 NOTE — Patient Instructions (Signed)
-   continue dosage of warfarin 1.5 tablet every day EXCEPT 2 TABLETS ON MONDAYS & FRIDAYS. - recheck in 4 weeks

## 2020-11-21 ENCOUNTER — Other Ambulatory Visit: Payer: Self-pay | Admitting: Family Medicine

## 2020-11-21 ENCOUNTER — Ambulatory Visit: Payer: Medicare Other | Admitting: Family Medicine

## 2020-11-21 DIAGNOSIS — F339 Major depressive disorder, recurrent, unspecified: Secondary | ICD-10-CM

## 2020-12-02 ENCOUNTER — Encounter: Payer: Medicare Other | Admitting: Internal Medicine

## 2020-12-04 NOTE — Progress Notes (Deleted)
Name: Felicia Poole   MRN: [id number]    DOB: 1945/11/02   Date:12/04/2020       Progress Note  Subjective  Chief Complaint  Follow Up  HPI  Hypercholesterolemia: she Is taking statin therapy and denies side effects of medication   Angina Pectoris: she has a history of atrial myxoma status post resection with one vessel CABG in 2021 She also has non-ischemic cardiomyopathy with EF 45 % back in 2014 , she had repeat Echo 09/2019 showed drop of EF to 35-40 %, also has moderately elevated pulmonary artery systolic pressure, enlarged right ventricle, left atrial dilation, mitral valve regurgitation and tricuspid valve regurgitation  Senile purpura: on both arms, stable and reassurance given   Atrial fibrillation: has pacemaker for tachycardia-bradycardia syndrome, goes to coumadin clinic, sees Dr. Caryl Comes, denies bleeding.   CHF systolic: she is on lasix, ace inhibitor. She has orthopnea, but no lower extremity edema. Currently wheezing or sob. She is compliant with cardiologist visits.   Hypothyroidism: taking medication as prescribed, denies hair loss or dry skin, no change in bowel movement   GERD: she states doing better, very seldom chokes on her food when she east fast. No odynophagia.   HTN: taking medication daily, no side effects, chest pain intermittently, sob sometimes with activity but no palpitation  Mixed incontinence: she has been wearing depends for over one year, she has difficulty making to the bathroom on time, has urgency and stress symptoms.   Patient Active Problem List   Diagnosis Date Noted   Major depression, recurrent, chronic ([place]) 04/10/2020   Stage 3a chronic kidney disease ([place]) 04/10/2020   Age-related osteoporosis without current pathological fracture 04/10/2020   Thrombocytopenia ([place]) 04/10/2020   Angina pectoris ([place]) 04/11/2017   Senile purpura ([place]) 04/29/2015   Chronic anticoagulation 04/29/2015   Obesity 12/10/2014   Hypercholesterolemia  10/15/2013   Myoma 10/15/2013   Encounter for therapeutic drug monitoring 08/08/2013   Pacemaker -Medtronic 05/24/2013   Nonischemic cardiomyopathy ([place])    Atrial fibrillation ([place]) 02/28/2013   Arteriosclerosis of coronary artery 02/22/2013   Adult hypothyroidism 02/22/2013   Congestive heart failure with left ventricular systolic dysfunction ([place]) 02/22/2013   Tachycardia-bradycardia ([place]) 02/22/2013   Essential hypertension, benign    Atrial myxoma    Coronary atherosclerosis     Past Surgical History:  Procedure Laterality Date   ATRIAL MYXOMA EXCISION Left    CARDIAC CATHETERIZATION  2012   Unc; right and left heart 75% ostial LAD lesion   CORONARY ARTERY BYPASS GRAFT  2011   [facility]   INSERT / REPLACE / REMOVE PACEMAKER  02/2013   Dual chamber MDT advisa pacemaker. [id number]   VAGINAL HYSTERECTOMY      Family History  Problem Relation Age of Onset   Heart failure Mother    Cirrhosis Mother    Hyperlipidemia Father    Hypertension Father    Stroke Father    Heart attack Father     Social History   Tobacco Use   Smoking status: Never   Smokeless tobacco: Never   Tobacco comments:    smoking cessation materials not required  Substance Use Topics   Alcohol use: No     Current Outpatient Medications:    alendronate (FOSAMAX) 70 MG tablet, TAKE 1 TABLET (70 MG TOTAL) BY MOUTH ONCE A WEEK. WITH FULL GLASS OF WATER AND DO NOT RECLINE, Disp: 12 tablet, Rfl: 1   amLODipine (NORVASC) 10 MG tablet, Take 1 tablet (10 mg total) by mouth daily.,  Disp: 90 tablet, Rfl: 3   atorvastatin (LIPITOR) 80 MG tablet, TAKE 1 TABLET BY MOUTH EVERY DAY, Disp: 30 tablet, Rfl: 6   chlorthalidone (HYGROTON) 25 MG tablet, Take 1 tablet (25 mg total) by mouth daily., Disp: 90 tablet, Rfl: 3   fluticasone (FLONASE) 50 MCG/ACT nasal spray, SPRAY 2 SPRAYS IN EACH NOSTRIL AT BEDTIME, Disp: 48 mL, Rfl: 1   furosemide (LASIX) 40 MG tablet, Take 1 tablet (40 mg total) by mouth as needed. Take at  least once a week, Disp: 90 tablet, Rfl: 1   Incontinence Supply Disposable ([employer] BLADDER CONTROL PAD) MISC, 1 each by Does not apply route daily., Disp: 100 each, Rfl: 1   levothyroxine (SYNTHROID) 75 MCG tablet, TAKE 1 TABLET BY MOUTH DAILY BEFORE BREAKFAST., Disp: 90 tablet, Rfl: 0   lisinopril (PRINIVIL,ZESTRIL) 20 MG tablet, TAKE 1 TABLET (20 MG TOTAL) BY MOUTH DAILY., Disp: 90 tablet, Rfl: 3   OS-CAL CALCIUM + D3 500-200 MG-UNIT TABS, TAKE 1 TABLET BY MOUTH EVERY DAY, Disp: 90 tablet, Rfl: 0   sertraline (ZOLOFT) 100 MG tablet, TAKE 1 TABLET BY MOUTH EVERY DAY, Disp: 90 tablet, Rfl: 0   sotalol (BETAPACE) 80 MG tablet, Take 1 tablet (80 mg total) by mouth 2 (two) times daily., Disp: 180 tablet, Rfl: 2   Vitamin D, Ergocalciferol, (DRISDOL) 1.25 MG (50000 UNIT) CAPS capsule, TAKE 1 CAPSULE (50,000 UNITS TOTAL) BY MOUTH EVERY 7 (SEVEN) DAYS., Disp: 12 capsule, Rfl: 3   warfarin (COUMADIN) 2.5 MG tablet, TAKE AS DIRECTED BY THE ANTI-COAG CLINIC, Disp: 150 tablet, Rfl: 0  No Known Allergies  I personally reviewed {Reviewed:14835} with the patient/caregiver today.   ROS  ***  Objective  There were no vitals filed for this visit.  There is no height or weight on file to calculate BMI.  Physical Exam ***  Recent Results (from the past 2160 hour(s))  POCT INR     Status: Abnormal   Collection Time: 09/17/20 11:35 AM  Result Value Ref Range   INR 1.8 (A) 2.0 - 3.0  POCT INR     Status: None   Collection Time: 10/08/20 11:20 AM  Result Value Ref Range   INR 2.0 2.0 - 3.0  POCT INR     Status: None   Collection Time: 11/12/20 11:25 AM  Result Value Ref Range   INR 2.1 2.0 - 3.0    Diabetic Foot Exam: Diabetic Foot Exam - Simple   No data filed    ***  PHQ2/9: Depression screen [facility] 2/9 06/17/2020 04/10/2020 04/26/2019 03/16/2019 02/05/2019  Decreased Interest 1 1 0 1 2  Down, Depressed, Hopeless 1 1 0 0 2  PHQ - 2 Score 2 2 0 1 4  Altered sleeping 0 1 0 0 3  Tired,  decreased energy 1 3 0 1 1  Change in appetite 0 0 0 0 0  Feeling bad or failure about yourself  0 0 0 0 1  Trouble concentrating 0 0 0 1 0  Moving slowly or fidgety/restless 0 0 0 1 3  Suicidal thoughts 0 0 0 0 0  PHQ-9 Score 3 6 0 4 12  Difficult doing work/chores Not difficult at all Not difficult at all - Somewhat difficult Not difficult at all  Some recent data might be hidden    phq 9 is {gen pos QQI:[id number]} ***  Fall Risk: Fall Risk  06/17/2020 04/10/2020 04/10/2020 04/26/2019 02/05/2019  Falls in the past year? 0 0 0 0 1  Comment - - - - -  Number falls in past yr: 0 0 0 0 1  Injury with Fall? 0 0 0 0 1  Risk for fall due to : No Fall Risks - - - -  Risk for fall due to: Comment - - - - -  Follow up Falls prevention discussed - - - -   ***   Functional Status Survey:   ***   Assessment & Plan  *** There are no diagnoses linked to this encounter.

## 2020-12-05 ENCOUNTER — Ambulatory Visit: Payer: Medicare Other | Admitting: Family Medicine

## 2020-12-10 ENCOUNTER — Ambulatory Visit (INDEPENDENT_AMBULATORY_CARE_PROVIDER_SITE_OTHER): Payer: Medicare Other | Admitting: Pharmacist

## 2020-12-10 ENCOUNTER — Other Ambulatory Visit: Payer: Self-pay

## 2020-12-10 DIAGNOSIS — I4891 Unspecified atrial fibrillation: Secondary | ICD-10-CM | POA: Diagnosis not present

## 2020-12-10 DIAGNOSIS — Z5181 Encounter for therapeutic drug level monitoring: Secondary | ICD-10-CM

## 2020-12-10 LAB — POCT INR: INR: 1.6 — AB (ref 2.0–3.0)

## 2020-12-10 NOTE — Patient Instructions (Signed)
Description   - Take 2 tablets today and then - continue dosage of warfarin 1.5 tablet every day EXCEPT 2 TABLETS ON MONDAYS & FRIDAYS. - recheck in 2 weeks

## 2020-12-19 NOTE — Progress Notes (Addendum)
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 She takes diuretic prn a couple of times a week only.   Senile purpura: on both arms, stable and reassurance given , she tries to keep arms moisturized   MDD: she has a long history of depression, but worse she became a widow about 9 years ago, she also gets very sad when loved die. She was zoloft but she stopped because it caused headaches, she would like to try something else. She still has episodes of crying and feels nervous intermittently. We will try Prozac   Atrial fibrillation: has pacemaker for tachycardia-bradycardia syndrome, goes to coumadin clinic, sees Dr. Caryl Comes, denies bleeding. INR was sub-therapeutic during her last visit, she is going back this week to re-check it She denies palpitation or SOB   CHF systolic: she is on lasix, ace inhibitor. She has orthopnea, but no lower extremity edema. Currently no wheezing or sob. She is compliant with cardiologist visits. Taking Lasix just a couple of times a week and doing well   Hypothyroidism: taking levothyroxine 75 mcg daily ,  denies hair loss or dry skin, no change in bowel movement Her last TSH was suppressed  GERD: she states doing better, no recent episodes of choking.   HTN: taking medication daily, no side effects, chest pain intermittently, SOB is intermittent but not  currently. BP is at goal  Mixed incontinence: she has been wearing depends for over one year, she has difficulty making to the bathroom on time, has urgency and stress symptoms. Discussed referral to Urologist , but she wants to hold off for now  Patient Active Problem List   Diagnosis Date Noted   Major depression, recurrent, chronic ([place]) 04/10/2020   Stage 3a chronic kidney disease ([place]) 04/10/2020   Age-related osteoporosis without current pathological fracture 04/10/2020   Thrombocytopenia ([place]) 04/10/2020   Angina pectoris ([place]) 04/11/2017   Senile purpura ([place]) 04/29/2015   Chronic anticoagulation 04/29/2015   Obesity 12/10/2014   Hypercholesterolemia 10/15/2013   Myoma 10/15/2013   Encounter for therapeutic drug monitoring 08/08/2013   Pacemaker -Medtronic 05/24/2013   Nonischemic cardiomyopathy ([place])    Atrial fibrillation ([place]) 02/28/2013   Arteriosclerosis of coronary artery 02/22/2013   Adult hypothyroidism 02/22/2013   Congestive heart failure with left ventricular systolic dysfunction ([place]) 02/22/2013   Tachycardia-bradycardia ([place]) 02/22/2013   Essential hypertension, benign    Atrial myxoma    Coronary atherosclerosis     Past Surgical History:  Procedure Laterality Date   ATRIAL MYXOMA EXCISION Left    CARDIAC CATHETERIZATION  2012   Unc; right and left heart 75% ostial LAD lesion   CORONARY ARTERY BYPASS GRAFT  2011   [facility]   INSERT / REPLACE / REMOVE PACEMAKER  02/2013   Dual chamber MDT advisa pacemaker. MVP-R70   VAGINAL HYSTERECTOMY      Family History  Problem  Relation Age of Onset   Heart failure Mother    Cirrhosis Mother    Hyperlipidemia Father    Hypertension Father    Stroke Father    Heart attack Father     Social History   Tobacco Use   Smoking status: Never   Smokeless tobacco: Never   Tobacco comments:    smoking cessation materials not required  Substance Use Topics   Alcohol use: No     Current Outpatient Medications:     alendronate (FOSAMAX) 70 MG tablet, TAKE 1 TABLET (70 MG TOTAL) BY MOUTH ONCE A WEEK. WITH FULL GLASS OF WATER AND DO NOT RECLINE, Disp: 12 tablet, Rfl: 1   amLODipine (NORVASC) 10 MG tablet, Take 1 tablet (10 mg total) by mouth daily., Disp: 90 tablet, Rfl: 3   atorvastatin (LIPITOR) 80 MG tablet, TAKE 1 TABLET BY MOUTH EVERY DAY, Disp: 30 tablet, Rfl: 6   chlorthalidone (HYGROTON) 25 MG tablet, Take 1 tablet (25 mg total) by mouth daily., Disp: 90 tablet, Rfl: 3   fluticasone (FLONASE) 50 MCG/ACT nasal spray, SPRAY 2 SPRAYS IN EACH NOSTRIL AT BEDTIME, Disp: 48 mL, Rfl: 1   furosemide (LASIX) 40 MG tablet, Take 1 tablet (40 mg total) by mouth as needed. Take at least once a week, Disp: 90 tablet, Rfl: 1   Incontinence Supply Disposable ([employer] BLADDER CONTROL PAD) MISC, 1 each by Does not apply route daily., Disp: 100 each, Rfl: 1   levothyroxine (SYNTHROID) 75 MCG tablet, TAKE 1 TABLET BY MOUTH DAILY BEFORE BREAKFAST., Disp: 90 tablet, Rfl: 0   lisinopril (PRINIVIL,ZESTRIL) 20 MG tablet, TAKE 1 TABLET (20 MG TOTAL) BY MOUTH DAILY., Disp: 90 tablet, Rfl: 3   OS-CAL CALCIUM + D3 500-200 MG-UNIT TABS, TAKE 1 TABLET BY MOUTH EVERY DAY, Disp: 90 tablet, Rfl: 0   sertraline (ZOLOFT) 100 MG tablet, TAKE 1 TABLET BY MOUTH EVERY DAY, Disp: 90 tablet, Rfl: 0   sotalol (BETAPACE) 80 MG tablet, Take 1 tablet (80 mg total) by mouth 2 (two) times daily., Disp: 180 tablet, Rfl: 2   Vitamin D, Ergocalciferol, (DRISDOL) 1.25 MG (50000 UNIT) CAPS capsule, TAKE 1 CAPSULE (50,000 UNITS TOTAL) BY MOUTH EVERY 7 (SEVEN) DAYS., Disp: 12 capsule, Rfl: 3   warfarin (COUMADIN) 2.5 MG tablet, TAKE AS DIRECTED BY THE ANTI-COAG CLINIC, Disp: 150 tablet, Rfl: 0  No Known Allergies  I personally reviewed active problem list, medication list, allergies, family history, social history, health maintenance, notes from last encounter with the patient/caregiver today.   ROS  Constitutional: Negative for fever or weight change.   Respiratory: Negative for cough and intermittent  shortness of breath.   Cardiovascular: Negative for chest pain , very seldom has palpitations.  Gastrointestinal: Negative for abdominal pain, no bowel changes.  Musculoskeletal: positive  for gait problem and intermittent  joint swelling.  Skin: Negative for rash.  Neurological: Negative for dizziness or headache.  No other specific complaints in a complete review of systems (except as listed in HPI above).   Objective  Vitals:   12/22/20 0913  BP: 130/74  Pulse: 77  Resp: 16  Temp: 98.4 F (36.9 C)  SpO2: 97%  Weight: 134 lb (60.8 kg)  Height: 4\' 11"  (1.499 m)    Body mass index is 27.06 kg/m.  Physical Exam  Constitutional: Patient appears well-developed and well-nourished.  No distress.  HEENT: head atraumatic, normocephalic, pupils equal and reactive to light, neck supple Cardiovascular: Normal rate, regular rhythm and normal heart sounds.  2/6  murmur heard. Trace  BLE edema. Pulmonary/Chest: Effort normal and breath sounds normal. No respiratory distress. Abdominal: Soft.  There is no tenderness. Psychiatric: Patient has a normal mood and affect. behavior is normal. Judgment and thought content normal.  Skin: senile purpura   Recent Results (from the past 2160 hour(s))  POCT INR     Status: None   Collection Time: 10/08/20 11:20 AM  Result Value Ref Range   INR 2.0 2.0 - 3.0  POCT INR     Status: None   Collection Time: 11/12/20 11:25 AM  Result Value Ref Range   INR 2.1 2.0 - 3.0  POCT INR     Status: Abnormal   Collection Time: 12/10/20 11:20 AM  Result Value Ref Range   INR 1.6 (A) 2.0 - 3.0      PHQ2/9: Depression screen [facility] 2/9 12/22/2020 06/17/2020 04/10/2020 04/26/2019 03/16/2019  Decreased Interest 0 1 1 0 1  Down, Depressed, Hopeless 2 1 1  0 0  PHQ - 2 Score 2 2 2  0 1  Altered sleeping 1 0 1 0 0  Tired, decreased energy 2 1 3  0 1  Change in appetite 0 0 0 0 0  Feeling bad or failure about  yourself  0 0 0 0 0  Trouble concentrating 0 0 0 0 1  Moving slowly or fidgety/restless 0 0 0 0 1  Suicidal thoughts 0 0 0 0 0  PHQ-9 Score 5 3 6  0 4  Difficult doing work/chores Somewhat difficult Not difficult at all Not difficult at all - Somewhat difficult  Some recent data might be hidden    phq 9 is positive   Fall Risk: Fall Risk  12/22/2020 06/17/2020 04/10/2020 04/10/2020 04/26/2019  Falls in the past year? 0 0 0 0 0  Comment - - - - -  Number falls in past yr: 0 0 0 0 0  Injury with Fall? 0 0 0 0 0  Risk for fall due to : - No Fall Risks - - -  Risk for fall due to: Comment - - - - -  Follow up - Falls prevention discussed - - -     Functional Status Survey: Is the patient deaf or have difficulty hearing?: No Does the patient have difficulty seeing, even when wearing glasses/contacts?: No Does the patient have difficulty concentrating, remembering, or making decisions?: No Does the patient have difficulty walking or climbing stairs?: Yes Does the patient have difficulty dressing or bathing?: No Does the patient have difficulty doing errands alone such as visiting a doctor's office or shopping?: No    Assessment & Plan  1. Congestive heart failure with left ventricular systolic dysfunction (HCC)  - COMPLETE METABOLIC PANEL WITH GFR  2. Senile purpura (HCC)  Stable   3. Angina pectoris ([place])  Medication managed  4. Dyslipidemia  On statin therapy   5. Nonischemic cardiomyopathy ([place])   6. Adult hypothyroidism  - TSH  7. Major depression, recurrent, chronic (HCC)  - FLUoxetine (PROZAC) 10 MG capsule; Take 1 capsule (10 mg total) by mouth daily.  Dispense: 90 capsule; Refill: 0  8. GERD without esophagitis   9. Age-related osteoporosis without current pathological fracture   10. Hyperglycemia  - Hemoglobin A1c  11. Stress incontinence of urine   12. Atrial fibrillation, unspecified type ([place])   On coumadin

## 2020-12-22 ENCOUNTER — Other Ambulatory Visit: Payer: Self-pay

## 2020-12-22 ENCOUNTER — Ambulatory Visit (INDEPENDENT_AMBULATORY_CARE_PROVIDER_SITE_OTHER): Payer: Medicare Other | Admitting: Family Medicine

## 2020-12-22 ENCOUNTER — Encounter: Payer: Self-pay | Admitting: Family Medicine

## 2020-12-22 VITALS — BP 130/74 | HR 77 | Temp 98.4°F | Resp 16 | Ht 59.0 in | Wt 134.0 lb

## 2020-12-22 DIAGNOSIS — I428 Other cardiomyopathies: Secondary | ICD-10-CM

## 2020-12-22 DIAGNOSIS — I209 Angina pectoris, unspecified: Secondary | ICD-10-CM

## 2020-12-22 DIAGNOSIS — M810 Age-related osteoporosis without current pathological fracture: Secondary | ICD-10-CM | POA: Diagnosis not present

## 2020-12-22 DIAGNOSIS — E785 Hyperlipidemia, unspecified: Secondary | ICD-10-CM

## 2020-12-22 DIAGNOSIS — F339 Major depressive disorder, recurrent, unspecified: Secondary | ICD-10-CM | POA: Diagnosis not present

## 2020-12-22 DIAGNOSIS — R739 Hyperglycemia, unspecified: Secondary | ICD-10-CM | POA: Diagnosis not present

## 2020-12-22 DIAGNOSIS — I4891 Unspecified atrial fibrillation: Secondary | ICD-10-CM | POA: Diagnosis not present

## 2020-12-22 DIAGNOSIS — D692 Other nonthrombocytopenic purpura: Secondary | ICD-10-CM | POA: Diagnosis not present

## 2020-12-22 DIAGNOSIS — K219 Gastro-esophageal reflux disease without esophagitis: Secondary | ICD-10-CM | POA: Diagnosis not present

## 2020-12-22 DIAGNOSIS — I5020 Unspecified systolic (congestive) heart failure: Secondary | ICD-10-CM

## 2020-12-22 DIAGNOSIS — E039 Hypothyroidism, unspecified: Secondary | ICD-10-CM

## 2020-12-22 DIAGNOSIS — N393 Stress incontinence (female) (male): Secondary | ICD-10-CM

## 2020-12-22 MED ORDER — FLUOXETINE HCL 10 MG PO CAPS: 10.0000 mg | ORAL_CAPSULE | Freq: Every day | ORAL | 0 refills | Status: DC

## 2020-12-23 LAB — COMPLETE METABOLIC PANEL WITH GFR
AG Ratio: 1.6 (calc) (ref 1.0–2.5)
ALT: 18 U/L (ref 6–29)
AST: 27 U/L (ref 10–35)
Albumin: 3.9 g/dL (ref 3.6–5.1)
Alkaline phosphatase (APISO): 100 U/L (ref 37–153)
BUN: 16 mg/dL (ref 7–25)
CO2: 28 mmol/L (ref 20–32)
Calcium: 8.9 mg/dL (ref 8.6–10.4)
Chloride: 109 mmol/L (ref 98–110)
Creat: 0.88 mg/dL (ref 0.60–1.00)
Globulin: 2.5 g/dL (calc) (ref 1.9–3.7)
Glucose, Bld: 92 mg/dL (ref 65–99)
Potassium: 4.2 mmol/L (ref 3.5–5.3)
Sodium: 145 mmol/L (ref 135–146)
Total Bilirubin: 0.5 mg/dL (ref 0.2–1.2)
Total Protein: 6.4 g/dL (ref 6.1–8.1)
eGFR: 68 mL/min/{1.73_m2} (ref >=60)

## 2020-12-23 LAB — HEMOGLOBIN A1C
Hgb A1c MFr Bld: 5.9 % of total Hgb — ABNORMAL HIGH (ref <5.7)
Mean Plasma Glucose: 123 mg/dL
eAG (mmol/L): 6.8 mmol/L

## 2020-12-23 LAB — TSH: TSH: 0.37 mIU/L — ABNORMAL LOW (ref 0.40–4.50)

## 2020-12-24 ENCOUNTER — Ambulatory Visit (INDEPENDENT_AMBULATORY_CARE_PROVIDER_SITE_OTHER): Payer: Medicare Other

## 2020-12-24 ENCOUNTER — Other Ambulatory Visit: Payer: Self-pay

## 2020-12-24 DIAGNOSIS — I4891 Unspecified atrial fibrillation: Secondary | ICD-10-CM | POA: Diagnosis not present

## 2020-12-24 DIAGNOSIS — Z5181 Encounter for therapeutic drug level monitoring: Secondary | ICD-10-CM

## 2020-12-24 LAB — POCT INR: INR: 1.9 — AB (ref 2.0–3.0)

## 2020-12-24 NOTE — Patient Instructions (Signed)
-   START NEW dosage of warfarin 1.5 tablet every day EXCEPT 2 TABLETS ON MONDAYS, Felicia Poole. - recheck in 2 weeks

## 2021-01-07 ENCOUNTER — Other Ambulatory Visit: Payer: Self-pay

## 2021-01-07 ENCOUNTER — Ambulatory Visit (INDEPENDENT_AMBULATORY_CARE_PROVIDER_SITE_OTHER): Payer: Medicare Other

## 2021-01-07 DIAGNOSIS — I4891 Unspecified atrial fibrillation: Secondary | ICD-10-CM

## 2021-01-07 DIAGNOSIS — Z5181 Encounter for therapeutic drug level monitoring: Secondary | ICD-10-CM

## 2021-01-07 LAB — POCT INR: INR: 1.9 — AB (ref 2.0–3.0)

## 2021-01-07 NOTE — Patient Instructions (Signed)
-   take 2 tablets warfarin tonight, then  - continue dosage of warfarin 1.5 tablets every day EXCEPT 2 TABLETS ON MONDAYS, [place]. - recheck in 3 weeks

## 2021-01-20 ENCOUNTER — Encounter: Payer: Medicare Other | Admitting: Internal Medicine

## 2021-01-20 DIAGNOSIS — Z79899 Other long term (current) drug therapy: Secondary | ICD-10-CM

## 2021-01-20 DIAGNOSIS — Z950 Presence of cardiac pacemaker: Secondary | ICD-10-CM

## 2021-01-20 DIAGNOSIS — I495 Sick sinus syndrome: Secondary | ICD-10-CM

## 2021-01-20 DIAGNOSIS — I480 Paroxysmal atrial fibrillation: Secondary | ICD-10-CM

## 2021-01-28 ENCOUNTER — Ambulatory Visit (INDEPENDENT_AMBULATORY_CARE_PROVIDER_SITE_OTHER): Payer: Medicare Other

## 2021-01-28 ENCOUNTER — Other Ambulatory Visit: Payer: Self-pay

## 2021-01-28 DIAGNOSIS — Z5181 Encounter for therapeutic drug level monitoring: Secondary | ICD-10-CM | POA: Diagnosis not present

## 2021-01-28 DIAGNOSIS — I4891 Unspecified atrial fibrillation: Secondary | ICD-10-CM

## 2021-01-28 LAB — POCT INR: INR: 2 (ref 2.0–3.0)

## 2021-01-28 NOTE — Patient Instructions (Signed)
-   continue dosage of warfarin 1.5 tablets every day EXCEPT 2 TABLETS ON MONDAYS, [place]. - recheck in 4 weeks

## 2021-02-06 ENCOUNTER — Other Ambulatory Visit: Payer: Self-pay | Admitting: Family Medicine

## 2021-02-06 DIAGNOSIS — M8000XA Age-related osteoporosis with current pathological fracture, unspecified site, initial encounter for fracture: Secondary | ICD-10-CM

## 2021-02-21 ENCOUNTER — Other Ambulatory Visit: Payer: Self-pay | Admitting: Family Medicine

## 2021-02-21 DIAGNOSIS — F339 Major depressive disorder, recurrent, unspecified: Secondary | ICD-10-CM

## 2021-02-22 ENCOUNTER — Other Ambulatory Visit: Payer: Self-pay | Admitting: Family Medicine

## 2021-02-22 DIAGNOSIS — E039 Hypothyroidism, unspecified: Secondary | ICD-10-CM

## 2021-02-23 ENCOUNTER — Other Ambulatory Visit: Payer: Self-pay | Admitting: Family Medicine

## 2021-02-23 DIAGNOSIS — E039 Hypothyroidism, unspecified: Secondary | ICD-10-CM

## 2021-02-23 MED ORDER — LEVOTHYROXINE SODIUM 50 MCG PO TABS: 50.0000 ug | ORAL_TABLET | Freq: Every day | ORAL | 1 refills | Status: DC

## 2021-02-25 ENCOUNTER — Ambulatory Visit (INDEPENDENT_AMBULATORY_CARE_PROVIDER_SITE_OTHER): Payer: Medicare Other

## 2021-02-25 ENCOUNTER — Other Ambulatory Visit: Payer: Self-pay

## 2021-02-25 DIAGNOSIS — Z5181 Encounter for therapeutic drug level monitoring: Secondary | ICD-10-CM

## 2021-02-25 DIAGNOSIS — I4891 Unspecified atrial fibrillation: Secondary | ICD-10-CM | POA: Diagnosis not present

## 2021-02-25 LAB — POCT INR: INR: 2 (ref 2.0–3.0)

## 2021-02-25 NOTE — Patient Instructions (Signed)
-   continue dosage of warfarin 1.5 tablets every day EXCEPT 2 TABLETS ON MONDAYS, Felicia Poole. - recheck in 5 weeks

## 2021-03-31 ENCOUNTER — Encounter: Payer: Medicare Other | Admitting: Internal Medicine

## 2021-04-01 ENCOUNTER — Other Ambulatory Visit: Payer: Self-pay

## 2021-04-01 ENCOUNTER — Ambulatory Visit (INDEPENDENT_AMBULATORY_CARE_PROVIDER_SITE_OTHER): Payer: Medicare Other

## 2021-04-01 DIAGNOSIS — Z5181 Encounter for therapeutic drug level monitoring: Secondary | ICD-10-CM

## 2021-04-01 DIAGNOSIS — I4891 Unspecified atrial fibrillation: Secondary | ICD-10-CM

## 2021-04-01 LAB — POCT INR: INR: 1.6 — AB (ref 2.0–3.0)

## 2021-04-01 NOTE — Patient Instructions (Signed)
-   take extra 1/2 tablet tonight, then - resume dosage of warfarin 1.5 tablets every day EXCEPT 2 TABLETS ON MONDAYS, [place]. - recheck in 4 weeks

## 2021-05-06 ENCOUNTER — Ambulatory Visit (INDEPENDENT_AMBULATORY_CARE_PROVIDER_SITE_OTHER): Payer: Medicare Other

## 2021-05-06 ENCOUNTER — Other Ambulatory Visit: Payer: Self-pay

## 2021-05-06 DIAGNOSIS — Z5181 Encounter for therapeutic drug level monitoring: Secondary | ICD-10-CM | POA: Diagnosis not present

## 2021-05-06 DIAGNOSIS — I4891 Unspecified atrial fibrillation: Secondary | ICD-10-CM | POA: Diagnosis not present

## 2021-05-06 LAB — POCT INR: INR: 2.1 (ref 2.0–3.0)

## 2021-06-18 ENCOUNTER — Ambulatory Visit: Payer: Medicare Other

## 2021-06-23 NOTE — Progress Notes (Deleted)
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 She takes diuretic prn a couple of times a week only.   Senile purpura: on both arms, stable and reassurance given , she tries to keep arms moisturized   MDD: she has a long history of depression, but worse she became a widow about 9 years ago, she also gets very sad when loved die. She was zoloft but she stopped because it caused headaches, she would like to try something else. She still has episodes of crying and feels nervous intermittently. We will try Prozac   Atrial fibrillation: has pacemaker for tachycardia-bradycardia syndrome, goes to coumadin clinic, sees Dr. Caryl Comes, denies bleeding. INR was sub-therapeutic during her last visit, she is going back this week to re-check it She denies palpitation or SOB   CHF systolic: she is on lasix, ace inhibitor. She has orthopnea, but no lower extremity edema. Currently no wheezing or sob. She is compliant with cardiologist visits. Taking Lasix just a couple of times a week and doing well   Hypothyroidism: taking levothyroxine 75 mcg daily ,  denies hair loss or dry skin, no change in bowel movement Her last TSH was suppressed  GERD: she states doing better, no recent episodes of choking.   HTN: taking medication daily, no side effects, chest pain intermittently, SOB is intermittent but not  currently. BP is at goal  Mixed incontinence: she has been wearing depends for over one year, she has difficulty making to the bathroom on time, has urgency and stress symptoms. Discussed referral to Urologist , but she wants to hold off for now  Patient Active Problem List   Diagnosis Date Noted   Major depression, recurrent, chronic ([place]) 04/10/2020   Stage 3a chronic kidney disease ([place]) 04/10/2020   Age-related osteoporosis without current pathological fracture 04/10/2020   Thrombocytopenia ([place]) 04/10/2020   Angina pectoris ([place]) 04/11/2017   Senile purpura ([place]) 04/29/2015   Chronic anticoagulation 04/29/2015   Obesity 12/10/2014   Hypercholesterolemia 10/15/2013   Myoma 10/15/2013   Encounter for therapeutic drug monitoring 08/08/2013   Pacemaker -Medtronic 05/24/2013   Nonischemic cardiomyopathy ([place])    Atrial fibrillation ([place]) 02/28/2013   Arteriosclerosis of coronary artery 02/22/2013   Adult hypothyroidism 02/22/2013   Congestive heart failure with left ventricular systolic dysfunction ([place]) 02/22/2013   Tachycardia-bradycardia ([place]) 02/22/2013   Essential hypertension, benign    Atrial myxoma    Coronary atherosclerosis     Past Surgical History:  Procedure Laterality Date   ATRIAL MYXOMA EXCISION Left    CARDIAC CATHETERIZATION  2012   Unc; right and left heart 75% ostial LAD lesion   CORONARY ARTERY BYPASS GRAFT  2011   [facility]   INSERT / REPLACE / REMOVE PACEMAKER  02/2013   Dual chamber MDT advisa pacemaker. MVP-R70   VAGINAL HYSTERECTOMY      Family History  Problem  Relation Age of Onset   Heart failure Mother    Cirrhosis Mother    Hyperlipidemia Father    Hypertension Father    Stroke Father    Heart attack Father     Social History   Tobacco Use   Smoking status: Never   Smokeless tobacco: Never   Tobacco comments:    smoking cessation materials not required  Substance Use Topics   Alcohol use: No     Current Outpatient Medications:     alendronate (FOSAMAX) 70 MG tablet, TAKE 1 TABLET (70 MG TOTAL) BY MOUTH ONCE A WEEK. WITH FULL GLASS OF WATER AND DO NOT RECLINE, Disp: 12 tablet, Rfl: 1   amLODipine (NORVASC) 10 MG tablet, Take 1 tablet (10 mg total) by mouth daily., Disp: 90 tablet, Rfl: 3   atorvastatin (LIPITOR) 80 MG tablet, TAKE 1 TABLET BY MOUTH EVERY DAY, Disp: 30 tablet, Rfl: 6   chlorthalidone (HYGROTON) 25 MG tablet, Take 1 tablet (25 mg total) by mouth daily., Disp: 90 tablet, Rfl: 3   FLUoxetine (PROZAC) 10 MG capsule, Take 1 capsule (10 mg total) by mouth daily., Disp: 90 capsule, Rfl: 0   fluticasone (FLONASE) 50 MCG/ACT nasal spray, SPRAY 2 SPRAYS IN EACH NOSTRIL AT BEDTIME, Disp: 48 mL, Rfl: 1   furosemide (LASIX) 40 MG tablet, Take 1 tablet (40 mg total) by mouth as needed. Take at least once a week, Disp: 90 tablet, Rfl: 1   Incontinence Supply Disposable ([employer] BLADDER CONTROL PAD) MISC, 1 each by Does not apply route daily., Disp: 100 each, Rfl: 1   levothyroxine (SYNTHROID) 50 MCG tablet, Take 1-2 tablets (50-100 mcg total) by mouth daily. Recheck in 6 weeks, Disp: 34 tablet, Rfl: 1   lisinopril (PRINIVIL,ZESTRIL) 20 MG tablet, TAKE 1 TABLET (20 MG TOTAL) BY MOUTH DAILY., Disp: 90 tablet, Rfl: 3   OS-CAL CALCIUM + D3 500-200 MG-UNIT TABS, TAKE 1 TABLET BY MOUTH EVERY DAY, Disp: 90 tablet, Rfl: 0   sotalol (BETAPACE) 80 MG tablet, Take 1 tablet (80 mg total) by mouth 2 (two) times daily., Disp: 180 tablet, Rfl: 2   Vitamin D, Ergocalciferol, (DRISDOL) 1.25 MG (50000 UNIT) CAPS capsule, TAKE 1 CAPSULE (50,000 UNITS TOTAL) BY MOUTH EVERY 7 (SEVEN) DAYS., Disp: 12 capsule, Rfl: 3   warfarin (COUMADIN) 2.5 MG tablet, TAKE AS DIRECTED BY THE ANTI-COAG CLINIC, Disp: 150 tablet, Rfl: 0  No Known Allergies  I personally reviewed active problem list, medication list, allergies, family history, social history, health maintenance with the patient/caregiver today.   ROS  ***  Objective  There were no vitals filed  for this visit.  There is no height or weight on file to calculate BMI.  Physical Exam ***  Recent Results (from the past 2160 hour(s))  POCT INR     Status: Abnormal   Collection Time: 04/01/21 11:45 AM  Result Value Ref Range   INR 1.6 (A) 2.0 - 3.0  POCT INR     Status: None   Collection Time: 05/06/21  9:34 AM  Result Value Ref Range   INR 2.1 2.0 - 3.0    PHQ2/9: Depression screen [facility] 2/9 12/22/2020 06/17/2020 04/10/2020 04/26/2019 03/16/2019  Decreased Interest 0 1 1 0 1  Down, Depressed, Hopeless 2 1 1  0 0  PHQ - 2 Score 2 2 2  0 1  Altered sleeping 1 0 1 0 0  Tired, decreased energy 2 1 3  0 1  Change in appetite 0 0 0 0 0  Feeling bad or failure about yourself  0 0 0 0 0  Trouble concentrating 0 0 0 0 1  Moving slowly or fidgety/restless 0 0 0 0 1  Suicidal thoughts 0 0 0 0 0  PHQ-9 Score 5 3 6  0 4  Difficult doing work/chores Somewhat difficult Not difficult at all Not difficult at all - Somewhat difficult  Some recent data might be hidden    phq 9 is {gen pos [id number]}   Fall Risk: Fall Risk  12/22/2020 06/17/2020 04/10/2020 04/10/2020 04/26/2019  Falls in the past year? 0 0 0 0 0  Comment - - - - -  Number falls in past yr: 0 0 0 0 0  Injury with Fall? 0 0 0 0 0  Risk for fall due to : - No Fall Risks - - -  Risk for fall due to: Comment - - - - -  Follow up - Falls prevention discussed - - -      Functional Status Survey:      Assessment & Plan  *** There are no diagnoses linked to this encounter.

## 2021-06-24 ENCOUNTER — Ambulatory Visit: Payer: Medicare Other | Admitting: Family Medicine

## 2021-06-25 ENCOUNTER — Ambulatory Visit: Payer: Medicare Other

## 2021-07-20 NOTE — Progress Notes (Signed)
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 She takes diuretic prn a couple of times a week only.   Senile purpura: on both arms, stable and reassurance given. Unchanged   MDD: she has a long history of depression, but worse she became a widow about 9 years ago. She was zoloft but she stopped because it caused headaches, we gave her prozac 20 mg but she is still taking zoloft ( old rx that she brought it in) , advised to come in for memory test also. Currently worried about her granddaughter 71 yo that is in the hospital with a brain bleed   Atrial fibrillation: has pacemaker for tachycardia-bradycardia syndrome, goes to coumadin clinic, sees Dr. Caryl Comes, denies bleeding. We will try to send medication to [facility] . We will send her for a regular follow up also. Afraid to resume same dose of bp medications since not sure what she has at home   CHF systolic: she is on lasix, ace inhibitor ( but not on her medication bag)  She denies orthopnea, but no lower extremity edema. Daughter states some wheezing and SOB with activity intermittently  Hypothyroidism: taking levothyroxine 75 mcg daily, last dose was supposed to be down to 50 mcg, we will recheck labs today, denies hair loss or dry skin, no change in bowel movement   GERD: she states doing better, no recent  episodes of choking. No hurt burn or indigestion   HTN: does not seem to be taking any of her medications for bp at this time. We will contact Alex to try [facility]    Patient Active Problem List   Diagnosis Date Noted   Major depression, recurrent, chronic ([place]) 04/10/2020   Stage 3a chronic kidney disease ([place]) 04/10/2020   Age-related osteoporosis without current pathological fracture 04/10/2020   Thrombocytopenia ([place]) 04/10/2020   Angina pectoris ([place]) 04/11/2017   Senile purpura ([place]) 04/29/2015   Chronic anticoagulation 04/29/2015   Obesity 12/10/2014   Hypercholesterolemia 10/15/2013   Myoma 10/15/2013   Encounter for therapeutic drug monitoring 08/08/2013   Pacemaker -Medtronic 05/24/2013   Nonischemic cardiomyopathy ([place])    Atrial fibrillation ([place]) 02/28/2013   Arteriosclerosis of coronary artery 02/22/2013   Adult hypothyroidism 02/22/2013   Congestive heart failure with left ventricular systolic dysfunction ([place]) 02/22/2013   Tachycardia-bradycardia ([place]) 02/22/2013   Essential hypertension, benign    Atrial myxoma    Coronary atherosclerosis     Past Surgical History:  Procedure Laterality Date   ATRIAL MYXOMA EXCISION Left    CARDIAC CATHETERIZATION  2012   [facility]; right and left heart 75% ostial LAD lesion   CORONARY ARTERY BYPASS GRAFT  2011   [facility]   INSERT / REPLACE / REMOVE PACEMAKER  02/2013   Dual chamber MDT advisa pacemaker. MVP-R70   VAGINAL HYSTERECTOMY      Family History  Problem Relation Age of Onset   Heart failure Mother    Cirrhosis Mother    Hyperlipidemia Father    Hypertension Father    Stroke Father    Heart attack Father     Social History   Tobacco Use   Smoking status: Never   Smokeless tobacco: Never   Tobacco comments:    smoking cessation materials not required  Substance Use Topics   Alcohol use: No     Current Outpatient Medications:    alendronate (FOSAMAX) 70 MG tablet, TAKE 1 TABLET (70 MG TOTAL) BY MOUTH ONCE  A WEEK. WITH FULL GLASS OF WATER AND DO NOT RECLINE, Disp: 12 tablet, Rfl: 1   amLODipine (NORVASC) 10 MG tablet, Take 1 tablet (10 mg total) by mouth daily., Disp: 90 tablet, Rfl: 3   atorvastatin (LIPITOR) 80 MG tablet, TAKE 1 TABLET BY MOUTH EVERY DAY, Disp: 30 tablet, Rfl: 6   chlorthalidone (HYGROTON) 25 MG tablet, Take 1 tablet (25 mg total) by mouth daily., Disp: 90 tablet, Rfl: 3   FLUoxetine (PROZAC) 10 MG capsule, Take 1 capsule (10 mg total) by mouth daily., Disp: 90 capsule, Rfl: 0   fluticasone (FLONASE) 50 MCG/ACT nasal spray, SPRAY 2 SPRAYS IN EACH NOSTRIL AT BEDTIME, Disp: 48 mL, Rfl: 1   furosemide (LASIX) 40 MG tablet, Take 1 tablet (40 mg total) by mouth as needed. Take at least once a week, Disp: 90 tablet, Rfl: 1   Incontinence Supply Disposable ([employer] BLADDER CONTROL PAD) MISC, 1 each by Does not apply route daily., Disp: 100 each, Rfl: 1   levothyroxine (SYNTHROID) 50 MCG tablet, Take 1-2 tablets (50-100 mcg total) by mouth daily. Recheck in 6 weeks, Disp: 34 tablet, Rfl: 1   lisinopril (PRINIVIL,ZESTRIL) 20 MG tablet, TAKE 1 TABLET (20 MG TOTAL) BY MOUTH DAILY., Disp: 90 tablet, Rfl: 3   OS-CAL CALCIUM + D3 500-200 MG-UNIT TABS, TAKE 1 TABLET BY MOUTH EVERY DAY, Disp: 90 tablet, Rfl: 0   sotalol (BETAPACE) 80 MG tablet, Take 1 tablet (80 mg total) by mouth 2 (two) times daily., Disp: 180 tablet, Rfl: 2   Vitamin D, Ergocalciferol, (DRISDOL) 1.25 MG (50000 UNIT) CAPS capsule, TAKE 1 CAPSULE (50,000 UNITS TOTAL) BY MOUTH EVERY 7 (SEVEN) DAYS., Disp: 12 capsule, Rfl: 3   warfarin (COUMADIN) 2.5 MG tablet, TAKE AS DIRECTED BY THE ANTI-COAG CLINIC, Disp: 150 tablet, Rfl: 0  No Known Allergies  I personally reviewed active problem list, medication list, allergies, family history, social history, health maintenance with the patient/caregiver today.   ROS  Constitutional: Negative for fever or weight change.  Respiratory: Negative for cough but has intermittent  shortness of  breath.   Cardiovascular: Negative for chest pain or palpitations.  Gastrointestinal: Negative for abdominal pain, no bowel changes.  Musculoskeletal: Positive  for gait problem - walks slowly but no joint swelling.  Skin: Negative for rash.  Neurological: Negative for dizziness or headache.  No other specific complaints in a complete review of systems (except as listed in HPI above).   Objective  Vitals:   07/21/21 1107 07/21/21 1123  BP: (!) 158/90 (!) 152/88  Pulse: 81   Resp: 18   Temp: (!) 97.4 F (36.3 C)   TempSrc: Oral   SpO2: 98%   Weight: 127 lb 1.6 oz (57.7 kg)   Height: 4\' 11"  (1.499 m)     Body mass index is 25.67 kg/m.  Physical Exam  Constitutional: Patient appears petite and frail  HEENT: head atraumatic,  normocephalic, pupils equal and reactive to light, neck supple Cardiovascular: Normal rate, regular rhythm and normal heart sounds.  2/6  murmur heard. No BLE edema. Pulmonary/Chest: Effort normal and breath sounds normal. No respiratory distress. Abdominal: Soft.  There is no tenderness. Psychiatric: Patient has a normal mood and affect. behavior is normal. Judgment and thought content normal.   Recent Results (from the past 2160 hour(s))  POCT INR     Status: None   Collection Time: 05/06/21  9:34 AM  Result Value Ref Range   INR 2.1 2.0 - 3.0    PHQ2/9: Depression screen [facility] 2/9 07/21/2021 12/22/2020 06/17/2020 04/10/2020 04/26/2019  Decreased Interest 0 0 1 1 0  Down, Depressed, Hopeless 1 2 1 1  0  PHQ - 2 Score 1 2 2 2  0  Altered sleeping 0 1 0 1 0  Tired, decreased energy 0 2 1 3  0  Change in appetite 0 0 0 0 0  Feeling bad or failure about yourself  0 0 0 0 0  Trouble concentrating 0 0 0 0 0  Moving slowly or fidgety/restless 0 0 0 0 0  Suicidal thoughts 0 0 0 0 0  PHQ-9 Score 1 5 3 6  0  Difficult doing work/chores Not difficult at all Somewhat difficult Not difficult at all Not difficult at all -  Some recent data might be hidden    phq 9  is positive   Fall Risk: Fall Risk  07/21/2021 12/22/2020 06/17/2020 04/10/2020 04/10/2020  Falls in the past year? 0 0 0 0 0  Comment - - - - -  Number falls in past yr: 0 0 0 0 0  Injury with Fall? 0 0 0 0 0  Risk for fall due to : No Fall Risks - No Fall Risks - -  Risk for fall due to: Comment - - - - -  Follow up - - Falls prevention discussed - -      Functional Status Survey: Is the patient deaf or have difficulty hearing?: No Does the patient have difficulty seeing, even when wearing glasses/contacts?: Yes Does the patient have difficulty concentrating, remembering, or making decisions?: No Does the patient have difficulty walking or climbing stairs?: Yes Does the patient have difficulty dressing or bathing?: No Does the patient have difficulty doing errands alone such as visiting a doctor's office or shopping?: Yes    Assessment & Plan  1. Nonischemic cardiomyopathy (HCC)  - AMB Referral to [facility] Coordinaton  2. Stage 3a chronic kidney disease (HCC)  - COMPLETE METABOLIC PANEL WITH GFR - CBC with Differential/Platelet - AMB Referral to [facility]  3. Congestive heart failure with left ventricular systolic dysfunction (HCC)  - COMPLETE METABOLIC PANEL WITH GFR - CBC with Differential/Platelet - AMB Referral to [facility]  4. Senile purpura ([place])   5. GERD without esophagitis   6. Major depression, recurrent, chronic ([place])   7. Chronic atrial fibrillation (HCC)   8. Angina pectoris ([place])   9. Need for shingles vaccine  - Zoster Vaccine Adjuvanted [facility]) injection; Inject 0.5 mLs into the muscle once for 1 dose.  Dispense: 0.5 mL; Refill: 1  10. Adult hypothyroidism  - TSH  11. Dyslipidemia  - Lipid panel  12. Breast cancer screening by mammogram  - MM 3D SCREEN BREAST BILATERAL; Future  13. Age-related osteoporosis without current pathological fracture   14. Memory loss

## 2021-07-21 ENCOUNTER — Other Ambulatory Visit: Payer: Self-pay

## 2021-07-21 ENCOUNTER — Encounter: Payer: Self-pay | Admitting: Family Medicine

## 2021-07-21 ENCOUNTER — Telehealth: Payer: Self-pay | Admitting: *Deleted

## 2021-07-21 ENCOUNTER — Ambulatory Visit (INDEPENDENT_AMBULATORY_CARE_PROVIDER_SITE_OTHER): Payer: Medicare Other | Admitting: Family Medicine

## 2021-07-21 VITALS — BP 152/88 | HR 81 | Temp 97.4°F | Resp 18 | Ht 59.0 in | Wt 127.1 lb

## 2021-07-21 DIAGNOSIS — N1831 Chronic kidney disease, stage 3a: Secondary | ICD-10-CM

## 2021-07-21 DIAGNOSIS — I482 Chronic atrial fibrillation, unspecified: Secondary | ICD-10-CM | POA: Diagnosis not present

## 2021-07-21 DIAGNOSIS — I428 Other cardiomyopathies: Secondary | ICD-10-CM

## 2021-07-21 DIAGNOSIS — Z23 Encounter for immunization: Secondary | ICD-10-CM | POA: Diagnosis not present

## 2021-07-21 DIAGNOSIS — Z1231 Encounter for screening mammogram for malignant neoplasm of breast: Secondary | ICD-10-CM | POA: Diagnosis not present

## 2021-07-21 DIAGNOSIS — R413 Other amnesia: Secondary | ICD-10-CM

## 2021-07-21 DIAGNOSIS — K219 Gastro-esophageal reflux disease without esophagitis: Secondary | ICD-10-CM

## 2021-07-21 DIAGNOSIS — I209 Angina pectoris, unspecified: Secondary | ICD-10-CM

## 2021-07-21 DIAGNOSIS — E039 Hypothyroidism, unspecified: Secondary | ICD-10-CM

## 2021-07-21 DIAGNOSIS — I5020 Unspecified systolic (congestive) heart failure: Secondary | ICD-10-CM

## 2021-07-21 DIAGNOSIS — F339 Major depressive disorder, recurrent, unspecified: Secondary | ICD-10-CM

## 2021-07-21 DIAGNOSIS — D692 Other nonthrombocytopenic purpura: Secondary | ICD-10-CM | POA: Diagnosis not present

## 2021-07-21 DIAGNOSIS — M810 Age-related osteoporosis without current pathological fracture: Secondary | ICD-10-CM

## 2021-07-21 DIAGNOSIS — E785 Hyperlipidemia, unspecified: Secondary | ICD-10-CM

## 2021-07-21 DIAGNOSIS — R739 Hyperglycemia, unspecified: Secondary | ICD-10-CM | POA: Diagnosis not present

## 2021-07-21 MED ORDER — SHINGRIX 50 MCG/0.5ML IM SUSR: 0.5000 mL | Freq: Once | INTRAMUSCULAR | 1 refills | Status: AC

## 2021-07-21 NOTE — Chronic Care Management (AMB) (Signed)
Chronic Care Management   Note  07/21/2021 Name: Felicia Poole MRN: [id number] DOB: 12-06-1945  Felicia Poole is a 76 y.o. year old female who is a primary care patient of Steele Sizer, MD. I reached out to [facility] by phone today in response to a referral sent by Felicia Poole PCP.  Felicia Poole was given information about Chronic Care Management services today including:  CCM service includes personalized support from designated clinical staff supervised by her physician, including individualized plan of care and coordination with other care providers 24/7 contact phone numbers for assistance for urgent and routine care needs. Service will only be billed when office clinical staff spend 20 minutes or more in a month to coordinate care. Only one practitioner may furnish and bill the service in a calendar month. The patient may stop CCM services at any time (effective at the end of the month) by phone call to the office staff. The patient is responsible for co-pay (up to 20% after annual deductible is met) if co-pay is required by the individual health plan.   Patient agreed to services and verbal consent obtained.   Follow up plan: Telephone appointment with care management team member scheduled for: 07/23/2021 and 09/30/2021  Julian Hy, [place] Management  Direct Dial: [phone]

## 2021-07-22 ENCOUNTER — Ambulatory Visit (INDEPENDENT_AMBULATORY_CARE_PROVIDER_SITE_OTHER): Payer: Medicare Other

## 2021-07-22 DIAGNOSIS — I4891 Unspecified atrial fibrillation: Secondary | ICD-10-CM

## 2021-07-22 DIAGNOSIS — Z5181 Encounter for therapeutic drug level monitoring: Secondary | ICD-10-CM

## 2021-07-22 LAB — LIPID PANEL
Cholesterol: 203 mg/dL — ABNORMAL HIGH (ref <200)
HDL: 57 mg/dL (ref >=50)
LDL Cholesterol (Calc): 126 mg/dL (calc) — ABNORMAL HIGH
Non-HDL Cholesterol (Calc): 146 mg/dL (calc) — ABNORMAL HIGH (ref <130)
Total CHOL/HDL Ratio: 3.6 (calc) (ref <5.0)
Triglycerides: 101 mg/dL (ref <150)

## 2021-07-22 LAB — CBC WITH DIFFERENTIAL/PLATELET
Absolute Monocytes: 274 cells/uL (ref 200–950)
Basophils Absolute: 42 cells/uL (ref 0–200)
Basophils Relative: 1.1 %
Eosinophils Absolute: 141 cells/uL (ref 15–500)
Eosinophils Relative: 3.7 %
HCT: 44 % (ref 35.0–45.0)
Hemoglobin: 14.5 g/dL (ref 11.7–15.5)
Lymphs Abs: 1376 cells/uL (ref 850–3900)
MCH: 32.4 pg (ref 27.0–33.0)
MCHC: 33 g/dL (ref 32.0–36.0)
MCV: 98.4 fL (ref 80.0–100.0)
MPV: 12 fL (ref 7.5–12.5)
Monocytes Relative: 7.2 %
Neutro Abs: 1968 cells/uL (ref 1500–7800)
Neutrophils Relative %: 51.8 %
Platelets: 152 10*3/uL (ref 140–400)
RBC: 4.47 10*6/uL (ref 3.80–5.10)
RDW: 11.8 % (ref 11.0–15.0)
Total Lymphocyte: 36.2 %
WBC: 3.8 10*3/uL (ref 3.8–10.8)

## 2021-07-22 LAB — POCT INR: INR: 2 (ref 2.0–3.0)

## 2021-07-22 LAB — TSH: TSH: 10.68 mIU/L — ABNORMAL HIGH (ref 0.40–4.50)

## 2021-07-22 LAB — COMPLETE METABOLIC PANEL WITH GFR
AG Ratio: 1.5 (calc) (ref 1.0–2.5)
ALT: 21 U/L (ref 6–29)
AST: 25 U/L (ref 10–35)
Albumin: 4.1 g/dL (ref 3.6–5.1)
Alkaline phosphatase (APISO): 86 U/L (ref 37–153)
BUN/Creatinine Ratio: 13 (calc) (ref 6–22)
BUN: 14 mg/dL (ref 7–25)
CO2: 25 mmol/L (ref 20–32)
Calcium: 9.8 mg/dL (ref 8.6–10.4)
Chloride: 105 mmol/L (ref 98–110)
Creat: 1.05 mg/dL — ABNORMAL HIGH (ref 0.60–1.00)
Globulin: 2.8 g/dL (calc) (ref 1.9–3.7)
Glucose, Bld: 76 mg/dL (ref 65–99)
Potassium: 4.2 mmol/L (ref 3.5–5.3)
Sodium: 142 mmol/L (ref 135–146)
Total Bilirubin: 0.7 mg/dL (ref 0.2–1.2)
Total Protein: 6.9 g/dL (ref 6.1–8.1)
eGFR: 55 mL/min/{1.73_m2} — ABNORMAL LOW (ref >=60)

## 2021-07-22 NOTE — Progress Notes (Signed)
Pt has not had INR check since 05/06/21, has r/s her last 4 appts.  Discussed w/ pt and her daughter [name] the importance of keeping her appts and that we cannot send in refills of warfarin when she misses appts.  They both verbalize understanding, but spent most of the appt discussing why they were unable to come in, such as transportation issues.  They have a family member in the hospital, but it was unclear how this has kept them from being able to come in for INR checks.

## 2021-07-22 NOTE — Patient Instructions (Signed)
-   continue dosage of warfarin 1.5 tablets every day EXCEPT 2 TABLETS ON MONDAYS, [place]. - recheck in 6 weeks

## 2021-07-23 ENCOUNTER — Telehealth: Payer: Medicare Other

## 2021-07-23 ENCOUNTER — Telehealth: Payer: Self-pay

## 2021-07-23 NOTE — Telephone Encounter (Signed)
°  Care Management   Follow Up Note   07/23/2021 Name: Felicia Poole MRN: [id number] DOB: 11-May-1946   Primary Care Provider: Steele Sizer, MD Reason for referral : Chronic Care Management   An unsuccessful telephone outreach was attempted today. The patient was referred to the case management team for assistance with care management and care coordination.    Follow Up Plan:  A HIPAA compliant voice message was left today requesting a return call.   [facility] Care Management [facility] [phone]

## 2021-07-23 NOTE — Progress Notes (Unsigned)
Date:  07/23/2021   ID:  Felicia Poole, DOB [birth year], MRN [id number]  Patient Location:  [place] [address]   Provider location:   [facility], [place] office  PCP:  Steele Sizer, MD  Cardiologist:  [facility]  No chief complaint on file.   History of Present Illness:    Felicia Poole is a 76 y.o. female  past medical history of pacemaker Medtronic   atrial fibrillation , post termination pauses.  event recorder: pauses ;   CAD prior atrial myxoma status post resection with one-vessel CABG at [facility] in 2012.  nonischemic cardiomyopathy with ejection fraction 45%  Medication noncompliance Who presents for follow-up of her history of coronary disease prior bypass pacer  Last seen by myself with telemetry visit April 2020 Seen by Dr. Caryl Comes September 2021  Little bit of SOB, has to stop to recover  Not taking lasix, on last clinic visit, was doing once a week, now none Drinks a lot per family who presents with her today Weight up 5 pounds since 04/2020  Problems with depression, wants PMD to cvhange medication  Takes chlorthalidone daily  No chest pain Very sedentary, no regular exercise program  EKG personally reviewed by myself on todays visit Shows NSR paced rhythm rate 64 bpm  Prior CV studies:   The following studies were reviewed today:  Echocardiogram August 2014 ejection fraction 45 to 50%  Other past medical history reviewed nuclear test March 2014 by Dr. Clayborn Bigness that apparently showed no ischemia and normal left ventricular function.    DATE TEST      8/14 Echo    EF 45-50 %               Husband passed away last summer.  They have been married 50+ years.   Past Medical History:  Diagnosis Date   Atrial myxoma    a. 2012 s/p resection at [facility].   Benign neoplasm of heart    Contact dermatitis and other eczema, due to unspecified cause    Contusion of unspecified site    Coronary atherosclerosis  of unspecified type of vessel, native or graft    a. 08/2010 s/p CABG x 1 (LIMA->LAD @ time of atrial myxoma resection ([facility]); b. 01/2013 MV: No ischemia/infarct. EF 71%.   Cramp of limb    Depression    Essential hypertension, benign    Heartburn    Hyperlipidemia LDL goal <70    Lumbago    Nonischemic cardiomyopathy ([place])    a. EF: 45% in 2012; b. 01/2013 Echo: EF 45-50%, mild diff HK. Mild to mod MR. Mild BAE. Mod TR.   Osteoporosis, unspecified    Other specified cardiac dysrhythmias(427.89)    PAF (paroxysmal atrial fibrillation) (HCC)    a. CHA2DS2VASc = 6-->warfarin.  Rhythm management with sotalol.   Postsurgical percutaneous transluminal coronary angioplasty status    Tachy-brady syndrome ([place])    a. 02/2013 s/p MDT [id number] Advisa DR MRI DC PPM.   Type II or unspecified type diabetes mellitus without mention of complication, not stated as uncontrolled    Unspecified hypothyroidism    Unspecified menopausal and postmenopausal disorder    Past Surgical History:  Procedure Laterality Date   ATRIAL MYXOMA EXCISION Left    CARDIAC CATHETERIZATION  2012   [facility]; right and left heart 75% ostial LAD lesion   CORONARY ARTERY BYPASS GRAFT  2011   [facility]   INSERT / REPLACE / REMOVE PACEMAKER  02/2013  Dual chamber MDT advisa pacemaker. MVP-R70   VAGINAL HYSTERECTOMY       No outpatient medications have been marked as taking for the 07/24/21 encounter (Appointment) with Minna Merritts, MD.     Allergies:   Patient has no known allergies.   Social History   Tobacco Use   Smoking status: Never   Smokeless tobacco: Never   Tobacco comments:    smoking cessation materials not required  Vaping Use   Vaping Use: Never used  Substance Use Topics   Alcohol use: No   Drug use: No     Family Hx: The patient's family history includes Cirrhosis in her mother; Heart attack in her father; Heart failure in her mother; Hyperlipidemia in her father; Hypertension in her father; Stroke in her  father.  ROS:   Please see the history of present illness.    Review of Systems  Constitutional: Negative.   Respiratory: Negative.    Cardiovascular: Negative.   Gastrointestinal: Negative.   Musculoskeletal: Negative.   Neurological: Negative.   Psychiatric/Behavioral: Negative.    All other systems reviewed and are negative.   Labs/Other Tests and Data Reviewed:    Recent Labs: 07/21/2021: ALT 21; BUN 14; Creat 1.05; Hemoglobin 14.5; Platelets 152; Potassium 4.2; Sodium 142; TSH 10.68   Recent Lipid Panel Lab Results  Component Value Date/Time   CHOL 203 (H) 07/21/2021 12:07 PM   CHOL 228 (H) 04/29/2015 12:10 PM   TRIG 101 07/21/2021 12:07 PM   HDL 57 07/21/2021 12:07 PM   HDL 46 04/29/2015 12:10 PM   CHOLHDL 3.6 07/21/2021 12:07 PM   LDLCALC 126 (H) 07/21/2021 12:07 PM    Wt Readings from Last 3 Encounters:  07/21/21 127 lb 1.6 oz (57.7 kg)  12/22/20 134 lb (60.8 kg)  10/07/20 136 lb (61.7 kg)     Exam:    Vital Signs: Vital signs may also be detailed in the HPI There were no vitals taken for this visit.  Constitutional:  oriented to person, place, and time. No distress.  HENT:  Head: Grossly normal Eyes:  no discharge. No scleral icterus.  Neck: No JVD, no carotid bruits  Cardiovascular: Regular rate and rhythm, no murmurs appreciated Pulmonary/Chest: Clear to auscultation bilaterally, no wheezes or rails Abdominal: Soft.  no distension.  no tenderness.  Musculoskeletal: Normal range of motion Neurological:  normal muscle tone. Coordination normal. No atrophy Skin: Skin warm and dry Psychiatric: normal affect, pleasant   ASSESSMENT & PLAN:    Sinus node dysfunction (HCC) Has pacemaker, followed by EP stable  Atrial fibrillation, unspecified type ([place]) Maintaining NSR  Cardiac pacemaker in situ Followed by Dr. Caryl Comes  Atrial myxoma Prior surgery 2012  Essential hypertension, benign Blood pressure is well controlled on today's visit. No changes  made to the medications.  Coronary artery disease of native artery of native heart with stable angina pectoris ([place]) Currently with no symptoms of angina. No further workup at this time. Continue current medication regimen. Stressed importance of staying on her cholesterol medication  Essential hypertension Elevted, fluid overload today She will continue to take Lasix once a week  Hx of CABG Myxoma resection, bypass graft  Diastolic CHF Lasix today, tomorrow then once a week Discussed looking for warning signs such as abdominal swelling, leg swelling, worsening shortness of breath, has mild to moderate symptoms on today's visit   Total encounter time more than 25 minutes  Greater than 50% was spent in counseling and coordination of care with the  patient   Signed, Ida Rogue, MD  07/23/2021 6:38 PM    [place] Office [address] [place] [id number], [place], [place] [address]

## 2021-07-24 ENCOUNTER — Ambulatory Visit: Payer: Medicare Other | Admitting: Cardiovascular Disease

## 2021-07-24 DIAGNOSIS — Z950 Presence of cardiac pacemaker: Secondary | ICD-10-CM

## 2021-07-24 DIAGNOSIS — I495 Sick sinus syndrome: Secondary | ICD-10-CM

## 2021-07-24 DIAGNOSIS — I428 Other cardiomyopathies: Secondary | ICD-10-CM

## 2021-07-24 DIAGNOSIS — I10 Essential (primary) hypertension: Secondary | ICD-10-CM

## 2021-07-24 DIAGNOSIS — I2510 Atherosclerotic heart disease of native coronary artery without angina pectoris: Secondary | ICD-10-CM

## 2021-07-24 DIAGNOSIS — I4891 Unspecified atrial fibrillation: Secondary | ICD-10-CM

## 2021-07-28 ENCOUNTER — Ambulatory Visit (INDEPENDENT_AMBULATORY_CARE_PROVIDER_SITE_OTHER): Payer: Medicare Other

## 2021-07-28 DIAGNOSIS — Z0000 Encounter for general adult medical examination without abnormal findings: Secondary | ICD-10-CM

## 2021-07-28 NOTE — Progress Notes (Signed)
Subjective:   Felicia Poole is a 76 y.o. female who presents for Medicare Annual (Subsequent) preventive examination.  Virtual Visit via Telephone Note  I connected with  Felicia Poole on 07/28/21 at  8:40 AM EST by telephone and verified that I am speaking with the correct person using two identifiers.  Location: Patient: home Provider: [place] Persons participating in the virtual visit: patient & daughter Felicia Poole [facility] Health Advisor   I discussed the limitations, risks, security and privacy concerns of performing an evaluation and management service by telephone and the availability of in person appointments. The patient expressed understanding and agreed to proceed.  Interactive audio and video telecommunications were attempted between this nurse and patient, however failed, due to patient having technical difficulties OR patient did not have access to video capability.  We continued and completed visit with audio only.  Some vital signs may be absent or patient reported.   Clemetine Marker, LPN   Review of Systems     Cardiac Risk Factors include: advanced age (>20men, >27 women);dyslipidemia;hypertension     Objective:    There were no vitals filed for this visit. There is no height or weight on file to calculate BMI.  Advanced Directives 07/28/2021 06/17/2020 01/24/2018 01/24/2017 10/26/2016 10/06/2015 05/08/2015  Does Patient Have a Medical Advance Directive? No No No No No No No  Would patient like information on creating a medical advance directive? Yes (MAU/Ambulatory/Procedural Areas - Information given) Yes (MAU/Ambulatory/Procedural Areas - Information given) Yes (MAU/Ambulatory/Procedural Areas - Information given) Yes (ED - Information included in AVS) - No - patient declined information No - patient declined information    Current Medications (verified) Outpatient Encounter Medications as of 07/28/2021  Medication Sig   alendronate (FOSAMAX) 70 MG tablet TAKE 1 TABLET  (70 MG TOTAL) BY MOUTH ONCE A WEEK. WITH FULL GLASS OF WATER AND DO NOT RECLINE   amLODipine (NORVASC) 10 MG tablet Take 1 tablet (10 mg total) by mouth daily.   atorvastatin (LIPITOR) 80 MG tablet TAKE 1 TABLET BY MOUTH EVERY DAY   chlorthalidone (HYGROTON) 25 MG tablet Take 1 tablet (25 mg total) by mouth daily.   FLUoxetine (PROZAC) 10 MG capsule Take 1 capsule (10 mg total) by mouth daily.   fluticasone (FLONASE) 50 MCG/ACT nasal spray SPRAY 2 SPRAYS IN EACH NOSTRIL AT BEDTIME   furosemide (LASIX) 40 MG tablet Take 1 tablet (40 mg total) by mouth as needed. Take at least once a week   Incontinence Supply Disposable ([employer] BLADDER CONTROL PAD) MISC 1 each by Does not apply route daily.   levothyroxine (SYNTHROID) 50 MCG tablet Take 1-2 tablets (50-100 mcg total) by mouth daily. Recheck in 6 weeks   lisinopril (PRINIVIL,ZESTRIL) 20 MG tablet TAKE 1 TABLET (20 MG TOTAL) BY MOUTH DAILY.   sotalol (BETAPACE) 80 MG tablet Take 1 tablet (80 mg total) by mouth 2 (two) times daily.   warfarin (COUMADIN) 2.5 MG tablet TAKE AS DIRECTED BY THE ANTI-COAG CLINIC   OS-CAL CALCIUM + D3 500-200 MG-UNIT TABS TAKE 1 TABLET BY MOUTH EVERY DAY (Patient not taking: Reported on 07/28/2021)   Vitamin D, Ergocalciferol, (DRISDOL) 1.25 MG (50000 UNIT) CAPS capsule TAKE 1 CAPSULE (50,000 UNITS TOTAL) BY MOUTH EVERY 7 (SEVEN) DAYS. (Patient not taking: Reported on 07/28/2021)   No facility-administered encounter medications on file as of 07/28/2021.    Allergies (verified) Patient has no known allergies.   History: Past Medical History:  Diagnosis Date   Atrial myxoma    a.  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   Benign neoplasm of heart    Contact dermatitis and other eczema, due to unspecified cause    Contusion of unspecified site    Coronary atherosclerosis of unspecified type of vessel, native or graft    a. 08/2010 s/p CABG x 1 (LIMA->LAD @ time of atrial myxoma resection ([facility]); b. 01/2013 MV: No ischemia/infarct. EF 71%.    Cramp of limb    Depression    Essential hypertension, benign    Heartburn    Hyperlipidemia LDL goal <70    Lumbago    Nonischemic cardiomyopathy ([place])    a. EF: 45% in 2012; b. 01/2013 Echo: EF 45-50%, mild diff HK. Mild to mod MR. Mild BAE. Mod TR.   Osteoporosis, unspecified    Other specified cardiac dysrhythmias(427.89)    PAF (paroxysmal atrial fibrillation) (HCC)    a. CHA2DS2VASc = 6-->warfarin.  Rhythm management with sotalol.   Postsurgical percutaneous transluminal coronary angioplasty status    Tachy-brady syndrome ([place])    a. 02/2013 s/p MDT [id number] Advisa DR MRI DC PPM.   Type II or unspecified type diabetes mellitus without mention of complication, not stated as uncontrolled    Unspecified hypothyroidism    Unspecified menopausal and postmenopausal disorder    Past Surgical History:  Procedure Laterality Date   ATRIAL MYXOMA EXCISION Left    CARDIAC CATHETERIZATION  2012   [facility]; right and left heart 75% ostial LAD lesion   CORONARY ARTERY BYPASS GRAFT  2011   [facility]   INSERT / REPLACE / REMOVE PACEMAKER  02/2013   Dual chamber MDT advisa pacemaker. [id number]   VAGINAL HYSTERECTOMY     Family History  Problem Relation Age of Onset   Heart failure Mother    Cirrhosis Mother    Hyperlipidemia Father    Hypertension Father    Stroke Father    Heart attack Father    Social History   Socioeconomic History   Marital status: Widowed    Spouse name: Felicia Poole   Number of children: 5   Years of education: Not on file   Highest education level: 9th grade  Occupational History    Employer: DISABLED  Tobacco Use   Smoking status: Never   Smokeless tobacco: Never   Tobacco comments:    smoking cessation materials not required  Vaping Use   Vaping Use: Never used  Substance and Sexual Activity   Alcohol use: No   Drug use: No   Sexual activity: Never  Other Topics Concern   Not on file  Social History Narrative   Pt lives with son, daughter Felicia Poole, son in Sports coach,  granddaughter   Social Determinants of Health   Financial Resource Strain: Low Risk    Difficulty of Paying Living Expenses: Not hard at all  Food Insecurity: No Food Insecurity   Worried About Charity fundraiser in the Last Year: Never true   Arboriculturist in the Last Year: Never true  Transportation Needs: No Transportation Needs   Lack of Transportation (Medical): No   Lack of Transportation (Non-Medical): No  Physical Activity: Inactive   Days of Exercise per Week: 0 days   Minutes of Exercise per Session: 0 min  Stress: No Stress Concern Present   Feeling of Stress : Only a little  Social Connections: Socially Isolated   Frequency of Communication with Friends and Family: More than three times a week   Frequency of Social Gatherings with Friends and Family: More  than three times a week   Attends Religious Services: Never   Active Member of Clubs or Organizations: No   Attends Archivist Meetings: Never   Marital Status: Widowed    Tobacco Counseling Counseling given: Not Answered Tobacco comments: smoking cessation materials not required   Clinical Intake:  Pre-visit preparation completed: Yes  Pain : No/denies pain     Nutritional Risks: None Diabetes: No  How often do you need to have someone help you when you read instructions, pamphlets, or other written materials from your doctor or pharmacy?: 1 - Never   Interpreter Needed?: No  Information entered by :: Clemetine Marker LPN   Activities of Daily Living In your present state of health, do you have any difficulty performing the following activities: 07/28/2021 07/21/2021  Hearing? N N  Vision? Y Y  Difficulty concentrating or making decisions? N N  Walking or climbing stairs? N Y  Dressing or bathing? N N  Doing errands, shopping? Tempie Donning  Preparing Food and eating ? N -  Using the Toilet? N -  In the past six months, have you accidently leaked urine? Y -  Do you have problems with loss of  bowel control? N -  Managing your Medications? Y -  Managing your Finances? N -  Housekeeping or managing your Housekeeping? N -  Some recent data might be hidden    Patient Care Team: Steele Sizer, MD as PCP - General (Family Medicine) Deboraha Sprang, MD as Consulting Physician (Cardiology) Germaine Pomfret, [facility] (Pharmacist) Neldon Labella, RN as Case Manager Gollan, Kathlene November, MD as Consulting Physician (Cardiology)  Indicate any recent Medical Services you may have received from other than [facility] providers in the past year (date may be approximate).     Assessment:   This is a routine wellness examination for [facility].  Hearing/Vision screen Hearing Screening - Comments:: Pt denies hearing difficulty Vision Screening - Comments:: Pt c/o watery eyes at times, past due for eye exam. New referral sent to [facility] today   Dietary issues and exercise activities discussed: Current Exercise Habits: The patient does not participate in regular exercise at present, Exercise limited by: None identified   Goals Addressed             This Visit's Progress    Cut out extra servings   No change    Recommend cutting out extra servings and snacking in between meals.      DIET - INCREASE WATER INTAKE   On track    Recommend to drink at least 6-8 8oz glasses of water per day.       Depression Screen PHQ 2/9 Scores 07/28/2021 07/21/2021 12/22/2020 06/17/2020 04/10/2020 04/26/2019 03/16/2019  PHQ - 2 Score 1 1 2 2 2  0 1  PHQ- 9 Score 1 1 5 3 6  0 4    Fall Risk Fall Risk  07/28/2021 07/21/2021 12/22/2020 06/17/2020 04/10/2020  Falls in the past year? 0 0 0 0 0  Comment - - - - -  Number falls in past yr: 0 0 0 0 0  Injury with Fall? 0 0 0 0 0  Risk for fall due to : No Fall Risks No Fall Risks - No Fall Risks -  Risk for fall due to: Comment - - - - -  Follow up Falls prevention discussed - - Falls prevention discussed -    FALL RISK PREVENTION PERTAINING TO THE HOME:  Any stairs  in or around  the home? Yes  If so, are there any without handrails? No  Home free of loose throw rugs in walkways, pet beds, electrical cords, etc? Yes  Adequate lighting in your home to reduce risk of falls? Yes   ASSISTIVE DEVICES UTILIZED TO PREVENT FALLS:  Life alert? No  Use of a cane, walker or w/c? No  Grab bars in the bathroom? No  Shower chair or bench in shower? No  Elevated toilet seat or a handicapped toilet? No   TIMED UP AND GO:  Was the test performed? No .   Cognitive Function: pt declined 6CIT; cognitive function assessed by direct observation during telephonic visit. Pt would benefit from in person evaluation at next office visit due to difficulty with medication adherence and management.      6CIT Screen 06/17/2020 01/24/2018  What Year? 4 points 0 points  What month? 0 points 0 points  What time? 0 points 0 points  Count back from 20 2 points 2 points  Months in reverse 4 points 4 points  Repeat phrase 8 points 8 points  Total Score 18 14    Immunizations Immunization History  Administered Date(s) Administered   Fluad Quad(high Dose 65+) 02/05/2019   Influenza, High Dose Seasonal PF 04/29/2015   Pneumococcal Conjugate-13 04/29/2015   Pneumococcal Polysaccharide-23 02/17/2011   Tdap 04/28/2012    TDAP status: Up to date  Flu Vaccine status: Declined, Education has been provided regarding the importance of this vaccine but patient still declined. Advised may receive this vaccine at local pharmacy or Health Dept. Aware to provide a copy of the vaccination record if obtained from local pharmacy or Health Dept. Verbalized acceptance and understanding.  Pneumococcal vaccine status: Up to date  Covid-19 vaccine status: Declined, Education has been provided regarding the importance of this vaccine but patient still declined. Advised may receive this vaccine at local pharmacy or Health Dept.or vaccine clinic. Aware to provide a copy of the vaccination record if  obtained from local pharmacy or Health Dept. Verbalized acceptance and understanding.  Qualifies for Shingles Vaccine? Yes   Zostavax completed No   Shingrix Completed?: No.    Education has been provided regarding the importance of this vaccine. Patient has been advised to call insurance company to determine out of pocket expense if they have not yet received this vaccine. Advised may also receive vaccine at local pharmacy or Health Dept. Verbalized acceptance and understanding.  Screening Tests Health Maintenance  Topic Date Due   MAMMOGRAM  Never done   COVID-19 Vaccine (1) 08/05/2021 (Originally 09/13/1946)   INFLUENZA VACCINE  09/04/2021 (Originally 01/05/2021)   Zoster Vaccines- Shingrix (1 of 2) 10/18/2021 (Originally 03/15/1965)   TETANUS/TDAP  04/28/2022   Pneumonia Vaccine 3+ Years old  Completed   DEXA SCAN  Completed   Hepatitis C Screening  Completed   HPV VACCINES  Aged Out   COLONOSCOPY (Pts 45-4yrs Insurance coverage will need to be confirmed)  Discontinued    Health Maintenance  Health Maintenance Due  Topic Date Due   MAMMOGRAM  Never done    Colorectal cancer screening: No longer required.   Mammogram status: Ordered 07/21/21. Pt provided with contact info and advised to call to schedule appt.   Bone Density status: Ordered 07/21/21. Pt provided with contact info and advised to call to schedule appt.  Lung Cancer Screening: (Low Dose CT Chest recommended if Age 55-80 years, 30 pack-year currently smoking OR have quit w/in 15years.) does not qualify.   Additional Screening:  Hepatitis C Screening: does qualify; Completed 07/11/12  Vision Screening: Recommended annual ophthalmology exams for early detection of glaucoma and other disorders of the eye. Is the patient up to date with their annual eye exam?  No  Who is the provider or what is the name of the office in which the patient attends annual eye exams? Not established If pt is not established with a provider,  would they like to be referred to a provider to establish care? No . Referral sent last year; patient no showed appt at [facility]. Phone # provided to reschedule  Dental Screening: Recommended annual dental exams for proper oral hygiene  Community Resource Referral / Chronic Care Management: CRR required this visit?  No   CCM required this visit?  Yes  - urgent pharmacy need for medication adherence and management. CCM referral sent 07/21/21; staff message sent to pharmacist for follow up.      Plan:     I have personally reviewed and noted the following in the patients chart:   Medical and social history Use of alcohol, tobacco or illicit drugs  Current medications and supplements including opioid prescriptions.  Functional ability and status Nutritional status Physical activity Advanced directives List of other physicians Hospitalizations, surgeries, and ER visits in previous 12 months Vitals Screenings to include cognitive, depression, and falls Referrals and appointments  In addition, I have reviewed and discussed with patient certain preventive protocols, quality metrics, and best practice recommendations. A written personalized care plan for preventive services as well as general preventive health recommendations were provided to patient.   Due to this being a telephonic visit, the after visit summary with patients personalized plan was offered to patient via mail or my-chart. Patient was mailed a copy of AVS.  Clemetine Marker, LPN   [id number]   Nurse Notes: pt states she has been feeling run down and tired from working in her yard due to a notice she received from the [place] regarding the trash and cans in her yard. Pt states she is taking all medications, however, rx written and dispense dates are not on track with her current medications. Pt referred to CCM services on 07/21/21.

## 2021-07-28 NOTE — Patient Instructions (Signed)
Ms. Felicia Poole , Thank you for taking time to come for your Medicare Wellness Visit. I appreciate your ongoing commitment to your health goals. Please review the following plan we discussed and let me know if I can assist you in the future.   Screening recommendations/referrals: Colonoscopy: no longer required Mammogram: due. Ordered 07/21/21. Please call [phone] to schedule your mammogram and bone density screening.  Bone Density: due. Ordered 07/21/21  Recommended yearly ophthalmology/optometry visit for glaucoma screening and checkup. Please call [facility] at [phone] to reschedule your missed appointment.   Recommended yearly dental visit for hygiene and checkup  Vaccinations: Influenza vaccine: due Pneumococcal vaccine: done 04/29/15 Tdap vaccine: done 04/28/12 Shingles vaccine: Shingrix discussed. Please contact your pharmacy for coverage information.  Covid-19: declined  Advanced directives: Advance directive discussed with you today. I have provided a copy for you to complete at home and have notarized. Once this is complete please bring a copy in to our office so we can scan it into your chart.   Conditions/risks identified: Recommend increasing physical activity as tolerated  Next appointment: Follow up in one year for your annual wellness visit    Preventive Care 65 Years and Older, Female Preventive care refers to lifestyle choices and visits with your health care provider that can promote health and wellness. What does preventive care include? A yearly physical exam. This is also called an annual well check. Dental exams once or twice a year. Routine eye exams. Ask your health care provider how often you should have your eyes checked. Personal lifestyle choices, including: Daily care of your teeth and gums. Regular physical activity. Eating a healthy diet. Avoiding tobacco and drug use. Limiting alcohol use. Practicing safe sex. Taking low-dose  aspirin every day. Taking vitamin and mineral supplements as recommended by your health care provider. What happens during an annual well check? The services and screenings done by your health care provider during your annual well check will depend on your age, overall health, lifestyle risk factors, and family history of disease. Counseling  Your health care provider may ask you questions about your: Alcohol use. Tobacco use. Drug use. Emotional well-being. Home and relationship well-being. Sexual activity. Eating habits. History of falls. Memory and ability to understand (cognition). Work and work Statistician. Reproductive health. Screening  You may have the following tests or measurements: Height, weight, and BMI. Blood pressure. Lipid and cholesterol levels. These may be checked every 5 years, or more frequently if you are over 64 years old. Skin check. Lung cancer screening. You may have this screening every year starting at age 69 if you have a 30-pack-year history of smoking and currently smoke or have quit within the past 15 years. Fecal occult blood test (FOBT) of the stool. You may have this test every year starting at age 66. Flexible sigmoidoscopy or colonoscopy. You may have a sigmoidoscopy every 5 years or a colonoscopy every 10 years starting at age 45. Hepatitis C blood test. Hepatitis B blood test. Sexually transmitted disease (STD) testing. Diabetes screening. This is done by checking your blood sugar (glucose) after you have not eaten for a while (fasting). You may have this done every 1-3 years. Bone density scan. This is done to screen for osteoporosis. You may have this done starting at age 57. Mammogram. This may be done every 1-2 years. Talk to your health care provider about how often you should have regular mammograms. Talk with your health care provider about your test results, treatment options,  and if necessary, the need for more tests. Vaccines  Your  health care provider may recommend certain vaccines, such as: Influenza vaccine. This is recommended every year. Tetanus, diphtheria, and acellular pertussis (Tdap, Td) vaccine. You may need a Td booster every 10 years. Zoster vaccine. You may need this after age 48. Pneumococcal 13-valent conjugate (PCV13) vaccine. One dose is recommended after age 45. Pneumococcal polysaccharide (PPSV23) vaccine. One dose is recommended after age 8. Talk to your health care provider about which screenings and vaccines you need and how often you need them. This information is not intended to replace advice given to you by your health care provider. Make sure you discuss any questions you have with your health care provider. Document Released: 06/20/2015 Document Revised: 02/11/2016 Document Reviewed: 03/25/2015 Elsevier Interactive Patient Education  2017 [place] Prevention in the Home Falls can cause injuries. They can happen to people of all ages. There are many things you can do to make your home safe and to help prevent falls. What can I do on the outside of my home? Regularly fix the edges of walkways and driveways and fix any cracks. Remove anything that might make you trip as you walk through a door, such as a raised step or threshold. Trim any bushes or trees on the path to your home. Use bright outdoor lighting. Clear any walking paths of anything that might make someone trip, such as rocks or tools. Regularly check to see if handrails are loose or broken. Make sure that both sides of any steps have handrails. Any raised decks and porches should have guardrails on the edges. Have any leaves, snow, or ice cleared regularly. Use sand or salt on walking paths during winter. Clean up any spills in your garage right away. This includes oil or grease spills. What can I do in the bathroom? Use night lights. Install grab bars by the toilet and in the tub and shower. Do not use towel bars as  grab bars. Use non-skid mats or decals in the tub or shower. If you need to sit down in the shower, use a plastic, non-slip stool. Keep the floor dry. Clean up any water that spills on the floor as soon as it happens. Remove soap buildup in the tub or shower regularly. Attach bath mats securely with double-sided non-slip rug tape. Do not have throw rugs and other things on the floor that can make you trip. What can I do in the bedroom? Use night lights. Make sure that you have a light by your bed that is easy to reach. Do not use any sheets or blankets that are too big for your bed. They should not hang down onto the floor. Have a firm chair that has side arms. You can use this for support while you get dressed. Do not have throw rugs and other things on the floor that can make you trip. What can I do in the kitchen? Clean up any spills right away. Avoid walking on wet floors. Keep items that you use a lot in easy-to-reach places. If you need to reach something above you, use a strong step stool that has a grab bar. Keep electrical cords out of the way. Do not use floor polish or wax that makes floors slippery. If you must use wax, use non-skid floor wax. Do not have throw rugs and other things on the floor that can make you trip. What can I do with my stairs? Do not leave  any items on the stairs. Make sure that there are handrails on both sides of the stairs and use them. Fix handrails that are broken or loose. Make sure that handrails are as long as the stairways. Check any carpeting to make sure that it is firmly attached to the stairs. Fix any carpet that is loose or worn. Avoid having throw rugs at the top or bottom of the stairs. If you do have throw rugs, attach them to the floor with carpet tape. Make sure that you have a light switch at the top of the stairs and the bottom of the stairs. If you do not have them, ask someone to add them for you. What else can I do to help prevent  falls? Wear shoes that: Do not have high heels. Have rubber bottoms. Are comfortable and fit you well. Are closed at the toe. Do not wear sandals. If you use a stepladder: Make sure that it is fully opened. Do not climb a closed stepladder. Make sure that both sides of the stepladder are locked into place. Ask someone to hold it for you, if possible. Clearly mark and make sure that you can see: Any grab bars or handrails. First and last steps. Where the edge of each step is. Use tools that help you move around (mobility aids) if they are needed. These include: Canes. Walkers. Scooters. Crutches. Turn on the lights when you go into a dark area. Replace any light bulbs as soon as they burn out. Set up your furniture so you have a clear path. Avoid moving your furniture around. If any of your floors are uneven, fix them. If there are any pets around you, be aware of where they are. Review your medicines with your doctor. Some medicines can make you feel dizzy. This can increase your chance of falling. Ask your doctor what other things that you can do to help prevent falls. This information is not intended to replace advice given to you by your health care provider. Make sure you discuss any questions you have with your health care provider. Document Released: 03/20/2009 Document Revised: 10/30/2015 Document Reviewed: 06/28/2014 Elsevier Interactive Patient Education  2017 [employer].

## 2021-07-29 ENCOUNTER — Telehealth: Payer: Self-pay | Admitting: Family Medicine

## 2021-07-29 ENCOUNTER — Ambulatory Visit (INDEPENDENT_AMBULATORY_CARE_PROVIDER_SITE_OTHER): Payer: Medicare Other

## 2021-07-29 DIAGNOSIS — I5020 Unspecified systolic (congestive) heart failure: Secondary | ICD-10-CM

## 2021-07-29 NOTE — Telephone Encounter (Signed)
Copied from [place] [phone]. Topic: General - Other >> Jul 29, 2021  2:16 PM McGill, Nelva Bush wrote: Reason for CRM: Pt daughter requesting to reschedule upcoming appointment with case manager.   Pt' daughter requesting a call back.

## 2021-07-29 NOTE — Chronic Care Management (AMB) (Signed)
Chronic Care Management   CCM RN Visit Note  07/29/2021 Name: Felicia Poole MRN: [id number] DOB: August 17, 1945  Subjective: Felicia Poole is a 76 y.o. year old female who is a primary care patient of Felicia Sizer, MD. The care management team was consulted for assistance with disease management and care coordination needs.     Outpatient Encounter Medications as of 07/29/2021  Medication Sig Note   alendronate (FOSAMAX) 70 MG tablet TAKE 1 TABLET (70 MG TOTAL) BY MOUTH ONCE A WEEK. WITH FULL GLASS OF WATER AND DO NOT RECLINE    amLODipine (NORVASC) 10 MG tablet Take 1 tablet (10 mg total) by mouth daily.    atorvastatin (LIPITOR) 80 MG tablet TAKE 1 TABLET BY MOUTH EVERY DAY    chlorthalidone (HYGROTON) 25 MG tablet Take 1 tablet (25 mg total) by mouth daily.    FLUoxetine (PROZAC) 10 MG capsule Take 1 capsule (10 mg total) by mouth daily.    fluticasone (FLONASE) 50 MCG/ACT nasal spray SPRAY 2 SPRAYS IN EACH NOSTRIL AT BEDTIME    furosemide (LASIX) 40 MG tablet Take 1 tablet (40 mg total) by mouth as needed. Take at least once a week    Incontinence Supply Disposable ([facility] BLADDER CONTROL PAD) MISC 1 each by Does not apply route daily. 07/28/2021: Pt getting incontinence supplies through [facility]   levothyroxine (SYNTHROID) 50 MCG tablet Take 1-2 tablets (50-100 mcg total) by mouth daily. Recheck in 6 weeks    lisinopril (PRINIVIL,ZESTRIL) 20 MG tablet TAKE 1 TABLET (20 MG TOTAL) BY MOUTH DAILY.    OS-CAL CALCIUM + D3 500-200 MG-UNIT TABS TAKE 1 TABLET BY MOUTH EVERY DAY (Patient not taking: Reported on 07/28/2021)    sotalol (BETAPACE) 80 MG tablet Take 1 tablet (80 mg total) by mouth 2 (two) times daily.    Vitamin D, Ergocalciferol, (DRISDOL) 1.25 MG (50000 UNIT) CAPS capsule TAKE 1 CAPSULE (50,000 UNITS TOTAL) BY MOUTH EVERY 7 (SEVEN) DAYS. (Patient not taking: Reported on 07/28/2021)    warfarin (COUMADIN) 2.5 MG tablet TAKE AS DIRECTED BY THE ANTI-COAG CLINIC    No  facility-administered encounter medications on file as of 07/29/2021.    Patient Active Problem List   Diagnosis Date Noted   Major depression, recurrent, chronic ([place]) 04/10/2020   Stage 3a chronic kidney disease ([place]) 04/10/2020   Age-related osteoporosis without current pathological fracture 04/10/2020   Thrombocytopenia ([place]) 04/10/2020   Angina pectoris ([place]) 04/11/2017   Senile purpura ([place]) 04/29/2015   Chronic anticoagulation 04/29/2015   Obesity 12/10/2014   Hypercholesterolemia 10/15/2013   Myoma 10/15/2013   Encounter for therapeutic drug monitoring 08/08/2013   Pacemaker -Medtronic 05/24/2013   Nonischemic cardiomyopathy ([place])    Atrial fibrillation ([place]) 02/28/2013   Arteriosclerosis of coronary artery 02/22/2013   Adult hypothyroidism 02/22/2013   Congestive heart failure with left ventricular systolic dysfunction ([place]) 02/22/2013   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 Ms. Milhouse was scheduled to complete initial nursing outreach on tomorrow. Felicia Poole requested to cancel that appointment and reschedule for 08/05/21. Lengthy discussion regarding multiple concerns with inability to obtain medication refills. During recent clinic visit several of the patient's medications were expired. [facility]. Felicia Poole reports attempting to assist with medication management however they will require assistance and likely benefit from adherence packaging. Reports being away from the home  and unable to verify current medications today. Requested assistance with contacting [facility] to determine if any of the patient's prescriptions could be obtained today. Agreed to pick up available refills and discard expired prescriptions. She is aware of scheduled initial outreach with the CCM  Pharmacist on 08/26/21. Agreed to call or contact the clinic if she requires assistance before the scheduled CCM outreach.   PLAN Will follow up on 08/05/21.   [facility] Care Management [facility] [phone]

## 2021-07-29 NOTE — Telephone Encounter (Addendum)
Pt daughter stated pt was told at last AWV appointment medication pt has is out of date. Pt' daughter stated pt has no other medication.  Pt daughter is requesting a call back for medication clarification.

## 2021-07-30 ENCOUNTER — Telehealth: Payer: Medicare Other

## 2021-08-04 DIAGNOSIS — I5020 Unspecified systolic (congestive) heart failure: Secondary | ICD-10-CM

## 2021-08-05 ENCOUNTER — Telehealth: Payer: Medicare Other

## 2021-08-05 ENCOUNTER — Telehealth: Payer: Self-pay

## 2021-08-05 NOTE — Telephone Encounter (Signed)
?  Care Management  ? ?Follow Up Note ? ? ?08/05/2021 ?Name: Felicia Poole MRN: [id number] DOB: 1945-10-06 ? ? ?Primary Care Provider: Steele Sizer, MD ?Reason for referral : Chronic Care Management ? ? ?An unsuccessful telephone outreach was attempted today. The patient was referred to the case management team for assistance with care management and care coordination.  ? ? ?Follow Up Plan:  ?A HIPAA compliant voice message was left today requesting a return call. ? ? ?Masami Plata,RN ?[place]/[facility] Care Management ?[facility] ?[phone] ?

## 2021-08-12 ENCOUNTER — Telehealth: Payer: Self-pay

## 2021-08-12 ENCOUNTER — Telehealth: Payer: Medicare Other

## 2021-08-12 NOTE — Telephone Encounter (Signed)
?  Care Management  ? ?Follow Up Note ? ? ?08/12/2021 ?Name: Felicia Poole MRN: [id number] DOB: 03-03-1946 ? ? ?Primary Care Provider: Steele Sizer, MD ?Reason for referral : Chronic Care Management ? ? ?An unsuccessful telephone outreach was attempted today. The patient was referred to the case management team for assistance with care management and care coordination.  ? ?Follow Up Plan:  ?A HIPAA compliant voice message was left today requesting a return call. ? ? ?Mykalah Saari,RN ?/[facility] Care Management ?[facility] ?[phone] ?

## 2021-08-20 ENCOUNTER — Telehealth: Payer: Self-pay | Admitting: *Deleted

## 2021-08-20 NOTE — Chronic Care Management (AMB) (Signed)
?  Care Management  ? ?Note ? ?08/20/2021 ?Name: [name] MRN: [id number] DOB: 1945/12/11 ? ?Felicia Poole is a 76 y.o. year old female who is a primary care patient of Steele Sizer, MD and is actively engaged with the care management team. I reached out to [facility] by phone today to assist with re-scheduling an initial visit with the RN Case Manager ? ?Follow up plan: ?Unsuccessful telephone outreach attempt made. A HIPAA compliant phone message was left for the patient providing contact information and requesting a return call.  ? ?Arvella Massingale, CCMA ?Care Guide, Embedded Care Coordination ?[place]  Care Management  ?Direct Dial: [phone] ? ? ?

## 2021-08-20 NOTE — Progress Notes (Deleted)
Name: Felicia Poole   MRN: [id number]    DOB: [birth year]   Date:08/20/2021 ? ?     Progress Note ? ?Subjective ? ?Chief Complaint ? ?Follow Up ? ?HPI ? ?Hypercholesterolemia: she is supposed to be taking Atorvastatin 80 mg but she brought her medications but atorvastatin not in her bag today  ? 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 She takes diuretic prn a couple of times a week only.  ? ?Senile purpura: on both arms, stable and reassurance given. Unchanged  ? ?MDD: she has a long history of depression, but worse she became a widow about 9 years ago. She was zoloft but she stopped because it caused headaches, we gave her prozac 20 mg but she is still taking zoloft ( old rx that she brought it in) , advised to come in for memory test also. Currently worried about her granddaughter 64 yo that is in the hospital with a brain bleed  ? ?Atrial fibrillation: has pacemaker for tachycardia-bradycardia syndrome, goes to coumadin clinic, sees Dr. Caryl Comes, denies bleeding. We will try to send medication to [facility] . We will send her for a regular follow up also. Afraid to resume same dose of bp medications since not sure what she has at home  ? ?CHF systolic: she is on lasix, ace inhibitor ( but not on her medication bag)  She denies orthopnea, but no lower extremity edema. Daughter states some wheezing and SOB with activity intermittently ? ?Hypothyroidism: taking levothyroxine 75 mcg daily, last dose was supposed to be down to 50 mcg, we will recheck labs today, denies hair loss or dry skin, no change in bowel movement  ? ?GERD: she states doing better, no recent  episodes of choking. No hurt burn or indigestion  ? ?HTN: does not seem to be taking any of her medications for bp at this time. We will contact Alex to try [facility]  ? ?Patient Active Problem List  ? Diagnosis Date Noted  ? Major depression, recurrent, chronic ([place]) 04/10/2020  ? Stage 3a chronic kidney disease ([place]) 04/10/2020  ? Age-related osteoporosis without current pathological fracture 04/10/2020  ? Thrombocytopenia ([place]) 04/10/2020  ? Angina pectoris ([place]) 04/11/2017  ? Senile purpura ([place]) 04/29/2015  ? Chronic anticoagulation 04/29/2015  ? Obesity 12/10/2014  ? Hypercholesterolemia 10/15/2013  ? Myoma 10/15/2013  ? Encounter for therapeutic drug monitoring 08/08/2013  ? Pacemaker -Medtronic 05/24/2013  ? Nonischemic cardiomyopathy ([place])   ? Atrial fibrillation ([place]) 02/28/2013  ? Arteriosclerosis of coronary artery 02/22/2013  ? Adult hypothyroidism 02/22/2013  ? Congestive heart failure with left ventricular systolic dysfunction ([place]) 02/22/2013  ? 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 Essential hypertension, benign   ? Atrial myxoma   ? Coronary atherosclerosis   ? ? ?Past Surgical History:  ?Procedure Laterality Date  ? ATRIAL MYXOMA EXCISION Left   ? CARDIAC CATHETERIZATION  2012  ? [facility]; right and left heart 75% ostial LAD lesion  ? CORONARY ARTERY BYPASS GRAFT  2011  ? [facility]  ? INSERT / REPLACE / REMOVE PACEMAKER  02/2013  ? Dual chamber MDT advisa pacemaker. MVP-R70  ? VAGINAL HYSTERECTOMY    ? ? ?Family History  ?  Problem Relation Age of Onset  ? Heart failure Mother   ? Cirrhosis Mother   ? Hyperlipidemia Father   ? Hypertension Father   ? Stroke Father   ? Heart attack Father   ? ? ?Social History  ? ?Tobacco Use  ? Smoking status: Never  ? Smokeless tobacco: Never  ? Tobacco comments:  ?  smoking cessation materials not required  ?Substance Use Topics  ? Alcohol use: No  ? ? ? ?Current Outpatient Medications:  ?  alendronate (FOSAMAX) 70 MG tablet, TAKE 1 TABLET (70 MG TOTAL) BY MOUTH ONCE A  WEEK. WITH FULL GLASS OF WATER AND DO NOT RECLINE, Disp: 12 tablet, Rfl: 1 ?  amLODipine (NORVASC) 10 MG tablet, Take 1 tablet (10 mg total) by mouth daily., Disp: 90 tablet, Rfl: 3 ?  atorvastatin (LIPITOR) 80 MG tablet, TAKE 1 TABLET BY MOUTH EVERY DAY, Disp: 30 tablet, Rfl: 6 ?  chlorthalidone (HYGROTON) 25 MG tablet, Take 1 tablet (25 mg total) by mouth daily., Disp: 90 tablet, Rfl: 3 ?  FLUoxetine (PROZAC) 10 MG capsule, Take 1 capsule (10 mg total) by mouth daily., Disp: 90 capsule, Rfl: 0 ?  fluticasone (FLONASE) 50 MCG/ACT nasal spray, SPRAY 2 SPRAYS IN EACH NOSTRIL AT BEDTIME, Disp: 48 mL, Rfl: 1 ?  furosemide (LASIX) 40 MG tablet, Take 1 tablet (40 mg total) by mouth as needed. Take at least once a week, Disp: 90 tablet, Rfl: 1 ?  Incontinence Supply Disposable ([facility] BLADDER CONTROL PAD) MISC, 1 each by Does not apply route daily., Disp: 100 each, Rfl: 1 ?  levothyroxine (SYNTHROID) 50 MCG tablet, Take 1-2 tablets (50-100 mcg total) by mouth daily. Recheck in 6 weeks, Disp: 34 tablet, Rfl: 1 ?  lisinopril (PRINIVIL,ZESTRIL) 20 MG tablet, TAKE 1 TABLET (20 MG TOTAL) BY MOUTH DAILY., Disp: 90 tablet, Rfl: 3 ?  OS-CAL CALCIUM + D3 500-200 MG-UNIT TABS, TAKE 1 TABLET BY MOUTH EVERY DAY (Patient not taking: Reported on 07/28/2021), Disp: 90 tablet, Rfl: 0 ?  sotalol (BETAPACE) 80 MG tablet, Take 1 tablet (80 mg total) by mouth 2 (two) times daily., Disp: 180 tablet, Rfl: 2 ?  Vitamin D, Ergocalciferol, (DRISDOL) 1.25 MG (50000 UNIT) CAPS capsule, TAKE 1 CAPSULE (50,000 UNITS TOTAL) BY MOUTH EVERY 7 (SEVEN) DAYS. (Patient not taking: Reported on 07/28/2021), Disp: 12 capsule, Rfl: 3 ?  warfarin (COUMADIN) 2.5 MG tablet, TAKE AS DIRECTED BY THE ANTI-COAG CLINIC, Disp: 150 tablet, Rfl: 0 ? ?No Known Allergies ? ?I personally reviewed active problem list, medication list, allergies, family history, social history, health maintenance with the patient/caregiver today. ? ? ?ROS ? ?*** ? ?Objective ? ?There were no vitals  filed for this visit. ? ?There is no height or weight on file to calculate BMI. ? ?Physical Exam ?*** ? ?Recent Results (from the past 2160 hour(s))  ?Lipid panel     Status: Abnormal  ? Collection Time: 07/21/21 12:07 PM  ?Result Value Ref Range  ? Cholesterol 203 (H) <200 mg/dL  ? HDL 57 > OR = 50 mg/dL  ? Triglycerides 101 <150 mg/dL  ? LDL Cholesterol (Calc) 126 (H) mg/dL (calc)  ?  Comment: Reference range: <100 ?. ?Desirable range <100 mg/dL for primary prevention;   ?<70 mg/dL for patients with CHD or diabetic patients  ?with > or = 2 CHD risk factors. ?. ?LDL-C is now calculated using the Martin-Hopkins  ?calculation, which is a validated novel method providing  ?better accuracy than the Friedewald equation in   the  ?estimation of LDL-C.  ?Cresenciano Genre et al. Annamaria Helling. 1610;960(45): 2061-2068  ?(http://education.[employer].com/faq/FAQ164) ?  ? Total CHOL/HDL Ratio 3.6 <5.0 (calc)  ? Non-HDL Cholesterol (Calc) 146 (H) <130 mg/dL (calc)  ?  Comment: For patients with diabetes plus 1 major ASCVD risk  ?factor, treating to a non-HDL-C goal of <100 mg/dL  ?(LDL-C of <70 mg/dL) is considered a therapeutic  ?option. ?  ?TSH     Status: Abnormal  ? Collection Time: 07/21/21 12:07 PM  ?Result Value Ref Range  ? TSH 10.68 (H) 0.40 - 4.50 mIU/L  ?COMPLETE METABOLIC PANEL WITH GFR     Status: Abnormal  ? Collection Time: 07/21/21 12:07 PM  ?Result Value Ref Range  ? Glucose, Bld 76 65 - 99 mg/dL  ?  Comment: . ?           Fasting reference interval ?. ?  ? BUN 14 7 - 25 mg/dL  ? Creat 1.05 (H) 0.60 - 1.00 mg/dL  ? eGFR 55 (L) > OR = 60 mL/min/1.81m  ?  Comment: The eGFR is based on the CKD-EPI 2021 equation. To calculate  ?the new eGFR from a previous Creatinine or Cystatin C ?result, go to https://www.kidney.org/professionals/ ?kdoqi/gfr%5Fcalculator ?  ? BUN/Creatinine Ratio 13 6 - 22 (calc)  ? Sodium 142 135 - 146 mmol/L  ? Potassium 4.2 3.5 - 5.3 mmol/L  ? Chloride 105 98 - 110 mmol/L  ? CO2 25 20 - 32 mmol/L  ?  Calcium 9.8 8.6 - 10.4 mg/dL  ? Total Protein 6.9 6.1 - 8.1 g/dL  ? Albumin 4.1 3.6 - 5.1 g/dL  ? Globulin 2.8 1.9 - 3.7 g/dL (calc)  ? AG Ratio 1.5 1.0 - 2.5 (calc)  ? Total Bilirubin 0.7 0.2 - 1.2 mg/dL

## 2021-08-21 ENCOUNTER — Ambulatory Visit: Payer: Medicare Other | Admitting: Family Medicine

## 2021-08-21 ENCOUNTER — Ambulatory Visit: Payer: Medicare Other | Admitting: Cardiovascular Disease

## 2021-08-21 NOTE — Progress Notes (Deleted)
?  ? ?Date:  08/21/2021  ? ?IDToyia Poole, DOB May 09, 1946, MRN [id number] ? ?Patient Location:  ?[place] ?Fel[place]ole [id number]  ? ?Provider location:   ?Felicia Poole, [employer] office ? ?PCP:  Felicia Sizer, MD  ?Cardiologist:  Felicia Poole ? ?No chief complaint on file. ? ? ?History of Present Illness:   ? ?Felicia Poole is a 76 y.o. female  past medical history of ?pacemaker Medtronic   ?atrial fibrillation , post termination pauses.  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 ?Last seen in clinic by myself Oct 07, 2020 ? ? ? ?Little bit of SOB, has to stop to recover ? ?Not taking lasix, on last clinic visit, was doing once a week, now none ?Drinks a lot per family who presents with her today ?Weight up 5 pounds since 04/2020 ? ?Problems with depression, wants PMD to cvhange medication ? ?Takes chlorthalidone daily ? ?No chest pain ?Very sedentary, no regular exercise program ? ?EKG personally reviewed by myself on todays visit ?Shows NSR paced rhythm rate 64 bpm ? ?Prior CV studies:   ?The following studies were reviewed today: ? ?Echocardiogram August 2014 ejection fraction 45 to 50% ? ?Other past medical history reviewed ?nuclear test March 2014 by Dr. Clayborn Poole that apparently showed no ischemia and normal left ventricular function. ? ?  ?DATE TEST      ?8/14 Echo    EF 45-50 %    ?         ?  ?Husband passed away last summer.  They have been married 50+ years. ? ? ?Past Medical History:  ?Diagnosis Date  ? Atrial myxoma   ? a. 2012 s/p resection at [facility].  ? Benign neoplasm of heart   ? Contact dermatitis and other eczema, due to unspecified cause   ? Contusion of unspecified site   ? Coronary atherosclerosis of unspecified type of vessel, native or  graft   ? a. 08/2010 s/p CABG x 1 (LIMA->LAD @ time of atrial myxoma resection (Felicia Poole); b. 01/2013 MV: No ischemia/infarct. EF 71%.  ? Cramp of limb   ? Depression   ? Essential hypertension, benign   ? Heartburn   ? Hyperlipidemia LDL goal <70   ? Lumbago   ? Nonischemic cardiomyopathy (Felicia Poole)   ? a. EF: 45% in 2012; b. 01/2013 Echo: EF 45-50%, mild diff HK. Mild to mod MR. Mild BAE. Mod TR.  ? Osteoporosis, unspecified   ? Other specified cardiac dysrhythmias(427.89)   ? PAF (paroxysmal atrial fibrillation) ([place])   ? a. CHA2DS2VASc = 6-->warfarin.  Rhythm management with sotalol.  ? Postsurgical percutaneous transluminal coronary angioplasty status   ? Tachy-brady syndrome (Felicia Poole)   ? a. 02/2013 s/p MDT [id number] Advisa DR MRI DC PPM.  ? Type II or unspecified type diabetes mellitus without mention of complication, not stated as uncontrolled   ? Unspecified hypothyroidism   ? Unspecified menopausal and postmenopausal disorder   ? ?Past Surgical History:  ?Procedure Laterality Date  ? ATRIAL MYXOMA EXCISION Left   ? CARDIAC CATHETERIZATION  2012  ? [facility]; Poole and left heart 75% ostial LAD lesion  ? CORONARY ARTERY BYPASS GRAFT  2011  ? [facility]  ? INSERT / REPLACE / REMOVE PACEMAKER  02/2013  ? Dual chamber  MDT advisa pacemaker. MVP-R70  ? VAGINAL HYSTERECTOMY    ?  ? ?No outpatient medications have been marked as taking for the 08/21/21 encounter (Appointment) with Felicia Merritts, MD.  ?  ? ?Allergies:   Patient has no known allergies.  ? ?Social History  ? ?Tobacco Use  ? Smoking status: Never  ? Smokeless tobacco: Never  ? Tobacco comments:  ?  smoking cessation materials not required  ?Vaping Use  ? Vaping Use: Never used  ?Substance Use Topics  ? Alcohol use: No  ? Drug use: No  ?  ? ?Family Hx: ?The patient's family history includes Cirrhosis in her mother; Heart attack in her father; Heart failure in her mother; Hyperlipidemia in her father; Hypertension in her father; Stroke in her father. ? ?ROS:   ?Please see the history  of present illness.    ?Review of Systems  ?Constitutional: Negative.   ?Respiratory: Negative.    ?Cardiovascular: Negative.   ?Gastrointestinal: Negative.   ?Musculoskeletal: Negative.   ?Neurological: Negative.   ?Psychiatric/Behavioral: Negative.    ?All other systems reviewed and are negative.  ? ?Labs/Other Tests and Data Reviewed:   ? ?Recent Labs: ?07/21/2021: ALT 21; BUN 14; Creat 1.05; Hemoglobin 14.5; Platelets 152; Potassium 4.2; Sodium 142; TSH 10.68  ? ?Recent Lipid Panel ?Lab Results  ?Component Value Date/Time  ? CHOL 203 (H) 07/21/2021 12:07 PM  ? CHOL 228 (H) 04/29/2015 12:10 PM  ? TRIG 101 07/21/2021 12:07 PM  ? HDL 57 07/21/2021 12:07 PM  ? HDL 46 04/29/2015 12:10 PM  ? CHOLHDL 3.6 07/21/2021 12:07 PM  ? LDLCALC 126 (H) 07/21/2021 12:07 PM  ? ? ?Wt Readings from Last 3 Encounters:  ?07/21/21 127 lb 1.6 oz (57.7 kg)  ?12/22/20 134 lb (60.8 kg)  ?10/07/20 136 lb (61.7 kg)  ?  ? ?Exam:   ? ?Vital Signs: Vital signs may also be detailed in the HPI ?There were no vitals taken for this visit.  ?Constitutional:  oriented to person, place, and time. No distress.  ?HENT:  ?Head: Grossly normal ?Eyes:  no discharge. No scleral icterus.  ?Neck: No JVD, no carotid bruits  ?Cardiovascular: Regular rate and rhythm, no murmurs appreciated ?Pulmonary/Chest: Clear to auscultation bilaterally, no wheezes or rails ?Abdominal: Soft.  no distension.  no tenderness.  ?Musculoskeletal: Normal range of motion ?Neurological:  normal muscle tone. Coordination normal. No atrophy ?Skin: Skin warm and dry ?Psychiatric: normal affect, pleasant ? ? ?ASSESSMENT & PLAN:   ? ?Sinus node dysfunction (HCC) ?Has pacemaker, followed by EP ?stable ? ?Atrial fibrillation, unspecified type ([place]icia Poole) ?Maintaining NSR ? ?Cardiac pacemaker in situ ?Followed by Dr. Caryl Comes ? ?Atrial myxoma ?Prior surgery 2012 ? ?Essential hypertension, benign ?Blood pressure is well controlled on today's visit. No changes made to the medications. ? 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 No further workup at this time. Continue current medication regimen. ?Stressed importance of staying on her cholesterol medication ? ?Essential hypertension ?Elevted, fluid overload today ?She will continue to take Lasix once a week ? ?Hx of CABG ?Myxoma resection, bypass graft ? ?Diastolic CHF ?Lasix today, tomorrow then once a week ?Discussed looking for warning signs such as abdominal swelling, leg swelling, worsening shortness of breath, has mild to moderate symptoms on today's visit ? ? Total encounter time more than 25 minutes ? Greater than 50% was spent in counseling and coordination of care with the patient ? ? ?  Signed, ?Ida Rogue, MD  ?08/21/2021 8:13 AM    ?[facility] ?[address], [place], [place] [address] ? ?

## 2021-08-24 ENCOUNTER — Telehealth: Payer: Self-pay

## 2021-08-24 NOTE — Progress Notes (Signed)
? ? ?  Chronic Care Management ?Pharmacy Assistant  ? ?Name: Felicia Poole  MRN: [id number] DOB: 1946-02-08 ? ?Chart Review for clinical pharmacist on 08/26/2021 at 9:00 am. ? ?Conditions to be addressed/monitored: ?CKD Stage 3, Depression, Hypothyroidism, Osteoporosis, and Atrial Myxoma, Nonischemic Cardiomyopathy, Senile Purpura, Angina pectoris, Hypercholesterolemia.  ? ?Primary concerns for visit include: ?None ID  ? ?Recent office visits:  ?07/29/2021 Neldon Labella RN (CCM) No Medication Changes noted ?07/28/2021 Clemetine Marker LPN (PCP Office) Medicare Wellness noted ?07/21/2021 Dr. Ancil Boozer MD (PCP) No Medication Changes noted, AMB Referral to [facility] Coordination, Return in about 1 month  ? ?Recent consult visits:  ?No Recent consult visit  ? ?Hospital visits:  ?None in previous 6 months ? ?Questions for Clinical Pharmacist:  ? ?1.Are you able to connect with Patient? ? I was unable to connect with the patient or the patient daughter. ?  ?2.Confirmed appointment date/time with patient/caregiver?  ? None ID ?  ?3.Visit type telephone ?  ?Left Voice message to do initial question prior to patient appointment on 08/26/2021 for CCM at 9:00 am with Junius Argyle the Clinical pharmacist.  ? ? ?Medications: ?Outpatient Encounter Medications as of 08/24/2021  ?Medication Sig Note  ? alendronate (FOSAMAX) 70 MG tablet TAKE 1 TABLET (70 MG TOTAL) BY MOUTH ONCE A WEEK. WITH FULL GLASS OF WATER AND DO NOT RECLINE   ? amLODipine (NORVASC) 10 MG tablet Take 1 tablet (10 mg total) by mouth daily.   ? atorvastatin (LIPITOR) 80 MG tablet TAKE 1 TABLET BY MOUTH EVERY DAY   ? chlorthalidone (HYGROTON) 25 MG tablet Take 1 tablet (25 mg total) by mouth daily.   ? FLUoxetine (PROZAC) 10 MG capsule Take 1 capsule (10 mg total) by mouth daily.   ? fluticasone (FLONASE) 50 MCG/ACT nasal spray SPRAY 2 SPRAYS IN EACH NOSTRIL AT BEDTIME   ? furosemide (LASIX) 40 MG tablet Take 1 tablet (40 mg total) by mouth as needed. Take at least  once a week   ? Incontinence Supply Disposable ([facility] BLADDER CONTROL PAD) MISC 1 each by Does not apply route daily. 07/28/2021: Pt getting incontinence supplies through [place]  ? levothyroxine (SYNTHROID) 50 MCG tablet Take 1-2 tablets (50-100 mcg total) by mouth daily. Recheck in 6 weeks   ? lisinopril (PRINIVIL,ZESTRIL) 20 MG tablet TAKE 1 TABLET (20 MG TOTAL) BY MOUTH DAILY.   ? OS-CAL CALCIUM + D3 500-200 MG-UNIT TABS TAKE 1 TABLET BY MOUTH EVERY DAY (Patient not taking: Reported on 07/28/2021)   ? sotalol (BETAPACE) 80 MG tablet Take 1 tablet (80 mg total) by mouth 2 (two) times daily.   ? Vitamin D, Ergocalciferol, (DRISDOL) 1.25 MG (50000 UNIT) CAPS capsule TAKE 1 CAPSULE (50,000 UNITS TOTAL) BY MOUTH EVERY 7 (SEVEN) DAYS. (Patient not taking: Reported on 07/28/2021)   ? warfarin (COUMADIN) 2.5 MG tablet TAKE AS DIRECTED BY THE ANTI-COAG CLINIC   ? ?No facility-administered encounter medications on file as of 08/24/2021.  ? ? ?Care Gaps: ?Covid- 19 Vaccine ? ?Star Rating Drugs: ?Atorvastatin 80 mg last filled [facility]/01/[facility] ?Lisinopril 20 mg last filled 09/19/2018 [facility] ? ?Medication Fill Gaps: ?Amlodipine 10 MG last filled 09/19/2018 90 day supply. ?chlorthalidone 25 MG last filled 09/19/2018 90 day supply. ?Fluoxetine 10 MG last filled 09/19/2018 90 day supply. ?Levothyroxine  50 MCG last filled 02/23/2021 17 day supply. ?Sotalol 80 MG last filled 02/06/2021 90 day supply. ? ?Anderson Malta ?Clinical Pharmacist Assistant ?[phone]  ? ?

## 2021-08-26 ENCOUNTER — Telehealth: Payer: Medicare Other

## 2021-08-26 ENCOUNTER — Telehealth: Payer: Self-pay

## 2021-08-26 NOTE — Chronic Care Management (AMB) (Signed)
?  Care Management  ? ?Note ? ?08/26/2021 ?Name: Leianna Barga MRN: [id number] DOB: Apr 25, 1946 ? ?Felicia Poole is a 76 y.o. year old female who is a primary care patient of Steele Sizer, MD and is actively engaged with the care management team. I reached out to [facility] by phone today to assist with re-scheduling an initial visit with the Pharmacist ? ?Follow up plan: ?2nd Unsuccessful telephone outreach attempt made. A HIPAA compliant phone message was left for the patient providing contact information and requesting a return call.  ? ?Toniesha Zellner, CCMA ?Care Guide, Embedded Care Coordination ?[place]  Care Management  ?Direct Dial: [phone] ? ? ?

## 2021-08-26 NOTE — Telephone Encounter (Signed)
?  Care Management  ? ?Follow Up Note ? ? ?08/26/2021 ?Name: Felicia Poole MRN: [id number] DOB: 11/11/1945 ? ? ?Referred by: Steele Sizer, MD ?Reason for referral : No chief complaint on file. ? ? ?An unsuccessful telephone outreach was attempted today. The patient was referred to the case management team for assistance with care management and care coordination.  ? ?Follow Up Plan: A HIPPA compliant phone message was left for the patient providing contact information and requesting a return call.  ? ?Doristine Section, BCACP, CPP ?Clinical Pharmacist Practitioner  ?[facility] ?[phone] ? ? ?

## 2021-08-26 NOTE — Progress Notes (Deleted)
? ?Chronic Care Management ?Pharmacy Note ? ?08/26/2021 ?Name:  Felicia Poole MRN:  [id number] DOB:  1945-08-03 ? ?Summary: ?*** ? ?Recommendations/Changes made from today's visit: ?*** ? ?Plan: ?*** ? ? ?Subjective: ?Felicia Poole is an 76 y.o. year old female who is a primary patient of Steele Sizer, MD.  The CCM team was consulted for assistance with disease management and care coordination needs.   ? ?Engaged with patient by telephone for initial visit in response to provider referral for pharmacy case management and/or care coordination services.  ? ?Consent to Services:  ?The patient was given the following information about Chronic Care Management services today, agreed to services, and gave verbal consent: 1. CCM service includes personalized support from designated clinical staff supervised by the primary care provider, including individualized plan of care and coordination with other care providers 2. 24/7 contact phone numbers for assistance for urgent and routine care needs. 3. Service will only be billed when office clinical staff spend 20 minutes or more in a month to coordinate care. 4. Only one practitioner may furnish and bill the service in a calendar month. 5.The patient may stop CCM services at any time (effective at the end of the month) by phone call to the office staff. 6. The patient will be responsible for cost sharing (co-pay) of up to 20% of the service fee (after annual deductible is met). Patient agreed to services and consent obtained. ? ?Patient Care Team: ?Steele Sizer, MD as PCP - General (Family Medicine) ?Deboraha Sprang, MD as Consulting Physician (Cardiology) ?Germaine Pomfret, [facility] (Pharmacist) ?Neldon Labella, RN as Case Manager ?Minna Merritts, MD as Consulting Physician (Cardiology) ? ?Recent office visits: ?07/29/2021 Neldon Labella RN (CCM) No Medication Changes noted ?07/28/2021 Clemetine Marker LPN (PCP Office) Medicare Wellness noted ?07/21/2021 Dr. Ancil Boozer MD  (PCP) No Medication Changes noted, AMB Referral to [facility] Coordination, Return in about 1 month  ? ?Recent consult visits: ?No Recent consult visit  ? ?Hospital visits: ?None in previous 6 months ? ? ?Objective: ? ?Lab Results  ?Component Value Date  ? CREATININE 1.05 (H) 07/21/2021  ? BUN 14 07/21/2021  ? EGFR 55 (L) 07/21/2021  ? GFRNONAA 63 04/10/2020  ? GFRAA 72 04/10/2020  ? NA 142 07/21/2021  ? K 4.2 07/21/2021  ? CALCIUM 9.8 07/21/2021  ? CO2 25 07/21/2021  ? GLUCOSE 76 07/21/2021  ? ? ?Lab Results  ?Component Value Date/Time  ? HGBA1C 5.9 (H) 12/22/2020 09:51 AM  ? HGBA1C 6.0 (H) 04/10/2020 11:04 AM  ? MICROALBUR 100 04/29/2015 10:26 AM  ?  ?Last diabetic Eye exam: No results found for: HMDIABEYEEXA  ?Last diabetic Foot exam: No results found for: HMDIABFOOTEX  ? ?Lab Results  ?Component Value Date  ? CHOL 203 (H) 07/21/2021  ? HDL 57 07/21/2021  ? LDLCALC 126 (H) 07/21/2021  ? TRIG 101 07/21/2021  ? CHOLHDL 3.6 07/21/2021  ? ? ? ?  Latest Ref Rng & Units 07/21/2021  ? 12:07 PM 12/22/2020  ?  9:51 AM 04/10/2020  ? 11:04 AM  ?Hepatic Function  ?Total Protein 6.1 - 8.1 g/dL 6.9   6.4   6.4    ?AST 10 - 35 U/L 25   27   44    ?ALT 6 - 29 U/L 21   18   34    ?Total Bilirubin 0.2 - 1.2 mg/dL 0.7   0.5   0.4    ? ? ?Lab Results  ?Component Value Date/Time  ?  TSH 10.68 (H) 07/21/2021 12:07 PM  ? TSH 0.37 (L) 12/22/2020 09:51 AM  ? ? ? ?  Latest Ref Rng & Units 07/21/2021  ? 12:07 PM 04/10/2020  ? 11:04 AM 03/04/2020  ?  1:42 PM  ?CBC  ?WBC 3.8 - 10.8 Thousand/uL 3.8   4.2   4.3    ?Hemoglobin 11.7 - 15.5 g/dL 14.5   13.1   12.6    ?Hematocrit 35.0 - 45.0 % 44.0   39.4   37.8    ?Platelets 140 - 400 Thousand/uL 152   141   147    ? ? ?Lab Results  ?Component Value Date/Time  ? VD25OH 71 04/10/2020 11:04 AM  ? ? ?Clinical ASCVD: {YES/NO:21197} ?The 10-year ASCVD risk score (Arnett DK, et al., 2019) is: 31.3% ?  Values used to calculate the score: ?    Age: 58 years ?    Sex: Female ?    Is Non-Hispanic African  American: No ?    Diabetic: No ?    Tobacco smoker: No ?    Systolic Blood Pressure: 309 mmHg ?    Is BP treated: Yes ?    HDL Cholesterol: 57 mg/dL ?    Total Cholesterol: 203 mg/dL   ? ? ?  07/28/2021  ?  8:53 AM 07/21/2021  ? 10:59 AM 12/22/2020  ?  9:14 AM  ?Depression screen PHQ 2/9  ?Decreased Interest 0 0 0  ?Down, Depressed, Hopeless _0 ?PHQ - 2 Score _1 ?Altered sleeping 0 0 1  ?Tired, decreased energy 0 0 2  ?Change in appetite 0 0 0  ?Feeling bad or failure about yourself  0 0 0  ?Trouble concentrating 0 0 0  ?Moving slowly or fidgety/restless 0 0 0  ?Suicidal thoughts 0 0 0  ?PHQ-9 Score _2 ?Difficult doing work/chores Not difficult at all Not difficult at all Somewhat difficult  ?  ? ?***Other: (CHADS2VASc if Afib, MMRC or CAT for COPD, ACT, DEXA) ? ?Social History  ? ?Tobacco Use  ?Smoking Status Never  ?Smokeless Tobacco Never  ?Tobacco Comments  ? smoking cessation materials not required  ? ?BP Readings from Last 3 Encounters:  ?07/21/21 (!) 152/88  ?12/22/20 130/74  ?10/07/20 (!) 145/97  ? ?Pulse Readings from Last 3 Encounters:  ?07/21/21 81  ?12/22/20 77  ?10/07/20 73  ? ?Wt Readings from Last 3 Encounters:  ?07/21/21 127 lb 1.6 oz (57.7 kg)  ?12/22/20 134 lb (60.8 kg)  ?10/07/20 136 lb (61.7 kg)  ? ?BMI Readings from Last 3 Encounters:  ?07/21/21 25.67 kg/m?  ?12/22/20 27.06 kg/m?  ?10/07/20 27.47 kg/m?  ? ? ?Assessment/Interventions: Review of patient past medical history, allergies, medications, health status, including review of consultants reports, laboratory and other test data, was performed as part of comprehensive evaluation and provision of chronic care management services.  ? ?SDOH:  (Social Determinants of Health) assessments and interventions performed: Yes ? ?SDOH Screenings  ? ?Alcohol Screen: Low Risk   ? Last Alcohol Screening Score (AUDIT): 0  ?Depression (PHQ2-9): Low Risk   ? PHQ-2 Score: 1  ?Financial Resource Strain: Low Risk   ? Difficulty of Paying Living Expenses:  Not hard at all  ?Food Insecurity: No Food Insecurity  ? Worried About Charity fundraiser in the Last Year: Never true  ? Ran Out of Food in the Last Year: Never true  ?Housing: Low Risk   ? Last Housing Risk Score:  0  ?Physical Activity: Inactive  ? Days of Exercise per Week: 0 days  ? Minutes of Exercise per Session: 0 min  ?Social Connections: Socially Isolated  ? Frequency of Communication with Friends and Family: More than three times a week  ? Frequency of Social Gatherings with Friends and Family: More than three times a week  ? Attends Religious Services: Never  ? Active Member of Clubs or Organizations: No  ? Attends Archivist Meetings: Never  ? Marital Status: Widowed  ?Stress: No Stress Concern Present  ? Feeling of Stress : Only a little  ?Tobacco Use: Low Risk   ? Smoking Tobacco Use: Never  ? Smokeless Tobacco Use: Never  ? Passive Exposure: Not on file  ?Transportation Needs: No Transportation Needs  ? Lack of Transportation (Medical): No  ? Lack of Transportation (Non-Medical): No  ? ? ?CCM Care Plan ? ?No Known Allergies ? ?Medications Reviewed Today   ? ? Reviewed by Clemetine Marker, LPN (Licensed Practical Nurse) on 07/28/21 at [phone]  Med List Status: <None>  ? ?Medication Order Taking? Sig Documenting Provider Last Dose Status Informant  ?alendronate (FOSAMAX) 70 MG tablet [id number] Yes TAKE 1 TABLET (70 MG TOTAL) BY MOUTH ONCE A WEEK. WITH FULL GLASS OF WATER AND DO NOT RECLINE Sowles, Drue Stager, MD Taking Active   ?amLODipine (NORVASC) 10 MG tablet [id number] Yes Take 1 tablet (10 mg total) by mouth daily. Minna Merritts, MD Taking Active   ?atorvastatin (LIPITOR) 80 MG tablet [id number] Yes TAKE 1 TABLET BY MOUTH EVERY DAY Deboraha Sprang, MD Taking Active   ?chlorthalidone (HYGROTON) 25 MG tablet [id number] Yes Take 1 tablet (25 mg total) by mouth daily. Minna Merritts, MD Taking Active   ?FLUoxetine (PROZAC) 10 MG capsule [id number] Yes Take 1 capsule (10 mg total) by mouth daily.  Steele Sizer, MD Taking Active   ?fluticasone [facility]) 50 MCG/ACT nasal spray [id number] Yes SPRAY 2 SPRAYS IN EACH NOSTRIL AT BEDTIME Steele Sizer, MD Taking Active   ?furosemide (LASIX) 40 MG tablet

## 2021-09-02 ENCOUNTER — Other Ambulatory Visit: Payer: Self-pay

## 2021-09-02 ENCOUNTER — Ambulatory Visit (INDEPENDENT_AMBULATORY_CARE_PROVIDER_SITE_OTHER): Payer: Medicare Other

## 2021-09-02 DIAGNOSIS — Z5181 Encounter for therapeutic drug level monitoring: Secondary | ICD-10-CM | POA: Diagnosis not present

## 2021-09-02 DIAGNOSIS — I4891 Unspecified atrial fibrillation: Secondary | ICD-10-CM

## 2021-09-02 LAB — POCT INR: INR: 1.3 — AB (ref 2.0–3.0)

## 2021-09-02 MED ORDER — WARFARIN SODIUM 2.5 MG PO TABS: ORAL_TABLET | ORAL | 0 refills | Status: DC

## 2021-09-02 NOTE — Chronic Care Management (AMB) (Signed)
?  Care Management  ? ?Note ? ?09/02/2021 ?Name: [name] MRN: [id number] DOB: 1945/12/22 ? ?Felicia Poole is a 76 y.o. year old female who is a primary care patient of Steele Sizer, MD and is actively engaged with the care management team. I reached out to [facility] by phone today to assist with re-scheduling an initial visit with the RN Case Manager and Pharmacist ? ?Follow up plan: ?Telephone appointment with care management team member scheduled for: 09/03/2021 and 10/14/2021 ? ?Duard Spiewak, CCMA ?Care Guide, Embedded Care Coordination ?[place]  Care Management  ?Direct Dial: [phone] ? ? ?

## 2021-09-02 NOTE — Patient Instructions (Signed)
-   take extra 1/2 tablet warfarin tonight and tomorrow, then ?- continue dosage of warfarin 1.5 tablets every day EXCEPT 2 TABLETS ON MONDAYS, [place]. ?- recheck in 3 weeks ?

## 2021-09-03 ENCOUNTER — Telehealth: Payer: Medicare Other

## 2021-09-03 ENCOUNTER — Telehealth: Payer: Self-pay

## 2021-09-03 NOTE — Telephone Encounter (Signed)
?  Care Management  ? ?Follow Up Note ? ? ?09/03/2021 ?Name: Felicia Poole MRN: [id number] DOB: [birth year] ? ? ?Primary Care Provider: Steele Sizer, MD ?Reason for referral : Chronic Care Managemet and Chronic Care Management ? ? ?An unsuccessful telephone outreach was attempted today. The patient was referred to the case management team for assistance with care management and care coordination.  ? ?Follow Up Plan:  ?Received voice prompt that patient was not available. Unable to leave a voice message. ?A member of the care management team will contact [name] to reschedule outreach. ? ? ?Mehr Depaoli,RN ?Learned/[facility] Care Management ?[facility] ?[phone] ? ? ? ? ? ?

## 2021-09-05 ENCOUNTER — Telehealth: Payer: Self-pay | Admitting: Physician Assistant

## 2021-09-05 MED ORDER — SOTALOL HCL 80 MG PO TABS: 80.0000 mg | ORAL_TABLET | Freq: Two times a day (BID) | ORAL | 0 refills | Status: DC

## 2021-09-05 NOTE — Telephone Encounter (Addendum)
? ?  Received call to answering service that Tilford Pillar, daughter, was calling for [facility] as patient is out of rx and needs a new script. I tried to return this call twice to phone number provided and was given the message that the person I am trying to reach is not available and voicemail box is not set up yet. Someone did return the call but they said it was the wrong number. I called answering service and they transcribed number wrong - it is [phone]. I called the number back and was able to reach intendeed caller. Per family/patient request, patient is out of sotalol '80mg'$  BID, needs refill ASAP. ? ?Last OV 10/2020, was recommended to f/u 6 months at that time but has not yet occurred.  ? ?I will send in 7 day cross-cover refill prescription for sotalol to requesting pharmacy  ? ?Will route to primary cardiologist/[place] office triage team to review and provide additional full refills under MD if appropriate. ? ?[name] verbalized understanding and gratitude. She asked me how long it would be until pharmacy had it ready on the shelf. I told her I would be sending the rx in immediately electronically but that she should call the pharmacy to discuss when the fill would be ready for pickup. ? ?Charlie Pitter, PA-C ? ?

## 2021-09-07 MED ORDER — SOTALOL HCL 80 MG PO TABS: 80.0000 mg | ORAL_TABLET | Freq: Two times a day (BID) | ORAL | 0 refills | Status: DC

## 2021-09-07 NOTE — Telephone Encounter (Signed)
Reviewed the patient's chart. ?She is currently scheduled to see Ignacia Bayley, NP on 09/25/21. ? ?It would be more appropriate to schedule her to see Dr. Caryl Comes: ?1) since the refill is for Sotalol  ?2) she is overdue for her in office device follow up with him ?- she no showed for her EP appt in 03/2021 ? ?Please cancel her APP appointment and schedule her to see Dr. Caryl Comes. ?Last seen by him in 2021. ? ?We could add her on: ?Tuesday 4/11 at 8:40 am ?Wednesday 4/12 @ 3:40 pm or 4:00 pm ? ?She MUST keep her follow up appointments for medication refills for her antiarrhythmics to continue to be filled. ? ?Thanks! ?

## 2021-09-07 NOTE — Addendum Note (Signed)
Addended by: Mila Merry on: 09/07/2021 08:36 AM ? ? Modules accepted: Orders ? ?

## 2021-09-07 NOTE — Telephone Encounter (Signed)
Attempted to schedule.  

## 2021-09-09 ENCOUNTER — Other Ambulatory Visit: Payer: Self-pay | Admitting: Cardiovascular Disease

## 2021-09-09 NOTE — Telephone Encounter (Signed)
Warfarin refill was sent in at INR check last week 09/02/21. ?Will deny this refill.  ?

## 2021-09-15 NOTE — Telephone Encounter (Signed)
Can someone please try to reach out to the patient/ or daughter again. ?She really needs to be on Dr. Olin Pia schedule and NOT an APP. ? ?She has a device and is on sotalol. ? ? ?Thanks! ? ? ? ?Could see: ?4/13- 8:20 am/ 8:40 am ?4/18- 8:20 am/ 8:40 am ?4/20- 9:00 am ?5/2- has multiple openings right now ? ? ?

## 2021-09-15 NOTE — Telephone Encounter (Signed)
Attempted to reschedule. Lmov  ?

## 2021-09-17 NOTE — Telephone Encounter (Signed)
Patient rescheduled to see Dr Caryl Comes 10/05/21 at 11:00 am.  ? ?

## 2021-09-22 ENCOUNTER — Telehealth: Payer: Self-pay | Admitting: Cardiovascular Disease

## 2021-09-22 MED ORDER — SOTALOL HCL 80 MG PO TABS: 80.0000 mg | ORAL_TABLET | Freq: Two times a day (BID) | ORAL | 0 refills | Status: DC

## 2021-09-22 NOTE — Telephone Encounter (Signed)
Please advise if ok to refill for 30 days. ?Pt overdue for 6 month f/u. ?Pt has scheduled for 10/16/21. ?

## 2021-09-22 NOTE — Telephone Encounter (Signed)
30 day refill sent in.

## 2021-09-22 NOTE — Telephone Encounter (Signed)
?*  STAT* If patient is at the pharmacy, call can be transferred to refill team. ? ? ?1. Which medications need to be refilled? (please list name of each medication and dose if known)  ?sotalol (BETAPACE) 80 MG tablet ? ?2. Which pharmacy/location (including street and city if local pharmacy) is medication to be sent to? ?[place] ([place]), [place] ? ?3. Do they need a 30 day or 90 day supply? 30 with refills  ? ? ?Patient is out of medication  ?Patient is scheduled to see Cadence Furth 10/16/21 ?

## 2021-09-23 ENCOUNTER — Ambulatory Visit (INDEPENDENT_AMBULATORY_CARE_PROVIDER_SITE_OTHER): Payer: Medicare Other

## 2021-09-23 DIAGNOSIS — Z5181 Encounter for therapeutic drug level monitoring: Secondary | ICD-10-CM | POA: Diagnosis not present

## 2021-09-23 DIAGNOSIS — I4891 Unspecified atrial fibrillation: Secondary | ICD-10-CM | POA: Diagnosis not present

## 2021-09-23 LAB — POCT INR: INR: 2 (ref 2.0–3.0)

## 2021-09-23 NOTE — Patient Instructions (Signed)
-   continue dosage of warfarin 1.5 tablets every day EXCEPT 2 TABLETS ON MONDAYS, [place]. ?- recheck in 6 weeks ?

## 2021-09-25 ENCOUNTER — Ambulatory Visit: Payer: Medicare Other | Admitting: Nurse Practitioner

## 2021-09-30 ENCOUNTER — Telehealth: Payer: Medicare Other

## 2021-10-02 ENCOUNTER — Telehealth: Payer: Self-pay | Admitting: Internal Medicine

## 2021-10-02 NOTE — Telephone Encounter (Signed)
Attempted to contact the patient's daughter [name] (ok per [facility]), to confirm the patient's appointment with Dr. Caryl Comes on Monday 10/05/21 at 11:00 am. ? ?I left a detailed message stating it is very important that the patient keep her appointment as her device has not been checked in quite some time and in order to continue to refill her sotalol. ? ?I asked [name] to please call back to confirm. ? ?I do not need to speak with her unless she has a question. ? ? ?

## 2021-10-05 ENCOUNTER — Encounter: Payer: Medicare Other | Admitting: Internal Medicine

## 2021-10-05 DIAGNOSIS — I428 Other cardiomyopathies: Secondary | ICD-10-CM

## 2021-10-05 DIAGNOSIS — Z950 Presence of cardiac pacemaker: Secondary | ICD-10-CM

## 2021-10-05 DIAGNOSIS — I480 Paroxysmal atrial fibrillation: Secondary | ICD-10-CM

## 2021-10-05 DIAGNOSIS — I10 Essential (primary) hypertension: Secondary | ICD-10-CM

## 2021-10-05 DIAGNOSIS — I495 Sick sinus syndrome: Secondary | ICD-10-CM

## 2021-10-05 DIAGNOSIS — Z79899 Other long term (current) drug therapy: Secondary | ICD-10-CM

## 2021-10-08 ENCOUNTER — Telehealth: Payer: Self-pay

## 2021-10-08 NOTE — Progress Notes (Deleted)
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 She takes diuretic prn a couple of times a week only.   Senile purpura: on both arms, stable and reassurance given. Unchanged   MDD: she has a long history of depression, but worse she became a widow about 9 years ago. She was zoloft but she stopped because it caused headaches, we gave her prozac 20 mg but she is still taking zoloft ( old rx that she brought it in) , advised to come in for memory test also. Currently worried about her granddaughter 33 yo that is in the hospital with a brain bleed   Atrial fibrillation: has pacemaker for tachycardia-bradycardia syndrome, goes to coumadin clinic, sees Dr. Caryl Comes, denies bleeding. We will try to send medication to [facility] . We will send her for a regular follow up also. Afraid to resume same dose of bp medications since not sure what she has at home   CHF systolic: she is on lasix, ace inhibitor ( but not on her medication bag)  She denies orthopnea, but no lower extremity edema. Daughter states some wheezing and SOB with activity intermittently  Hypothyroidism: taking levothyroxine 75 mcg daily, last dose was supposed to be down to 50 mcg, we will recheck labs today, denies hair loss or dry skin, no change in bowel movement   GERD: she states doing better, no recent  episodes of choking. No hurt burn or indigestion   HTN: does not seem to be taking any of her medications for bp at this time. We will contact Alex to try [facility]   Patient Active Problem List   Diagnosis Date Noted   Major depression, recurrent, chronic ([place]) 04/10/2020   Stage 3a chronic kidney disease ([place]) 04/10/2020   Age-related osteoporosis without current pathological fracture 04/10/2020   Thrombocytopenia ([place]) 04/10/2020   Angina pectoris ([place]) 04/11/2017   Senile purpura ([place]) 04/29/2015   Chronic anticoagulation 04/29/2015   Obesity 12/10/2014   Hypercholesterolemia 10/15/2013   Myoma 10/15/2013   Encounter for therapeutic drug monitoring 08/08/2013   Pacemaker -Medtronic 05/24/2013   Nonischemic cardiomyopathy ([place])    Atrial fibrillation ([place]) 02/28/2013   Arteriosclerosis of coronary artery 02/22/2013   Adult hypothyroidism 02/22/2013   Congestive heart failure with left ventricular systolic dysfunction ([place]) 02/22/2013   Tachycardia-bradycardia ([place]) 02/22/2013   Essential hypertension, benign    Atrial myxoma    Coronary atherosclerosis     Past Surgical History:  Procedure Laterality Date   ATRIAL MYXOMA EXCISION Left    CARDIAC CATHETERIZATION  2012   Un[facility]; right and left heart 75% ostial LAD lesion   CORONARY ARTERY BYPASS GRAFT  2011   [facility]   INSERT / REPLACE / REMOVE PACEMAKER  02/2013   Dual chamber MDT advisa pacemaker. MVP-R70   VAGINAL HYSTERECTOMY      Family History  Problem Relation Age of Onset   Heart failure Mother    Cirrhosis Mother    Hyperlipidemia Father    Hypertension Father    Stroke Father    Heart attack Father     Social History   Tobacco Use   Smoking status: Never   Smokeless tobacco: Never   Tobacco comments:    smoking cessation materials not required  Substance Use Topics   Alcohol use: No     Current Outpatient Medications:    alendronate (FOSAMAX) 70 MG tablet, TAKE 1 TABLET (70 MG TOTAL) BY MOUTH ONCE A  WEEK. WITH FULL GLASS OF WATER AND DO NOT RECLINE, Disp: 12 tablet, Rfl: 1   amLODipine (NORVASC) 10 MG tablet, Take 1 tablet (10 mg total) by mouth daily., Disp: 90 tablet, Rfl: 3   atorvastatin (LIPITOR) 80 MG tablet, TAKE 1 TABLET BY MOUTH EVERY DAY, Disp: 30 tablet, Rfl: 6   chlorthalidone (HYGROTON) 25 MG tablet, Take 1 tablet (25 mg total) by mouth daily., Disp: 90 tablet, Rfl: 3   FLUoxetine (PROZAC) 10 MG capsule, Take 1 capsule (10 mg total) by mouth daily., Disp: 90 capsule, Rfl: 0   fluticasone (FLONASE) 50 MCG/ACT nasal spray, SPRAY 2 SPRAYS IN EACH NOSTRIL AT BEDTIME, Disp: 48 mL, Rfl: 1   furosemide (LASIX) 40 MG tablet, Take 1 tablet (40 mg total) by mouth as needed. Take at least once a week, Disp: 90 tablet, Rfl: 1   Incontinence Supply Disposable ([employer] BLADDER CONTROL PAD) MISC, 1 each by Does not apply route daily., Disp: 100 each, Rfl: 1   levothyroxine (SYNTHROID) 50 MCG tablet, Take 1-2 tablets (50-100 mcg total) by mouth daily. Recheck in 6 weeks, Disp: 34 tablet, Rfl: 1   lisinopril (PRINIVIL,ZESTRIL) 20 MG tablet, TAKE 1 TABLET (20 MG TOTAL) BY MOUTH DAILY., Disp: 90 tablet, Rfl: 3   OS-CAL CALCIUM + D3 500-200 MG-UNIT TABS, TAKE 1 TABLET BY MOUTH EVERY DAY (Patient not taking: Reported on 07/28/2021), Disp: 90 tablet, Rfl: 0   sotalol (BETAPACE) 80 MG tablet, Take 1 tablet (80 mg total) by mouth 2 (two) times daily. MUST BE SEEN IN OFFICE FOR FURTHER REFILLS, Disp: 60 tablet, Rfl: 0   Vitamin D, Ergocalciferol, (DRISDOL) 1.25 MG (50000 UNIT) CAPS capsule, TAKE 1 CAPSULE (50,000 UNITS TOTAL) BY MOUTH EVERY 7 (SEVEN) DAYS. (Patient not taking: Reported on 07/28/2021), Disp: 12 capsule, Rfl: 3   warfarin (COUMADIN) 2.5 MG tablet, Take 1-1.5 tablets daily as directed by the anti-coag clinic, Disp: 150 tablet, Rfl: 0  No Known Allergies  I personally reviewed {Reviewed:14835} with the patient/caregiver today.   ROS  ***  Objective  There were no vitals filed for this  visit.  There is no height or weight on file to calculate BMI.  Physical Exam ***  Recent Results (from the past 2160 hour(s))  Lipid panel     Status: Abnormal   Collection Time: 07/21/21 12:07 PM  Result Value Ref Range   Cholesterol 203 (H) <200 mg/dL   HDL 57 > OR = 50 mg/dL   Triglycerides 101 <150 mg/dL   LDL Cholesterol (Calc) 126 (H) mg/dL (calc)    Comment: Reference range: <100 . Desirable range <100 mg/dL for primary prevention;   <70 mg/dL for patients with CHD or diabetic patients  with > or = 2 CHD risk factors. Marland Kitchen LDL-C is now calculated using the Martin-Hopkins  calculation, which is a validated novel method providing  better accuracy than the Friedewald equation in  the  estimation of LDL-C.  Cresenciano Genre et al. Annamaria Helling. 3382;505(39): 2061-2068  (http://education.[employer].com/faq/FAQ164)    Total CHOL/HDL Ratio 3.6 <5.0 (calc)   Non-HDL Cholesterol (Calc) 146 (H) <130 mg/dL (calc)    Comment: For patients with diabetes plus 1 major ASCVD risk  factor, treating to a non-HDL-C goal of <100 mg/dL  (LDL-C of <70 mg/dL) is considered a therapeutic  option.   TSH     Status: Abnormal   Collection Time: 07/21/21 12:07 PM  Result Value Ref Range   TSH 10.68 (H) 0.40 - 4.50 mIU/L  COMPLETE METABOLIC PANEL WITH GFR     Status: Abnormal   Collection Time: 07/21/21 12:07 PM  Result Value Ref Range   Glucose, Bld 76 65 - 99 mg/dL    Comment: .            Fasting reference interval .    BUN 14 7 - 25 mg/dL   Creat 1.05 (H) 0.60 - 1.00 mg/dL   eGFR 55 (L) > OR = 60 mL/min/1.56m    Comment: The eGFR is based on the CKD-EPI 2021 equation. To calculate  the new eGFR from a previous Creatinine or Cystatin C result, go to https://www.kidney.org/professionals/ kdoqi/gfr%5Fcalculator    BUN/Creatinine Ratio 13 6 - 22 (calc)   Sodium 142 135 - 146 mmol/L   Potassium 4.2 3.5 - 5.3 mmol/L   Chloride 105 98 - 110 mmol/L   CO2 25 20 - 32 mmol/L   Calcium 9.8 8.6 -  10.4 mg/dL   Total Protein 6.9 6.1 - 8.1 g/dL   Albumin 4.1 3.6 - 5.1 g/dL   Globulin 2.8 1.9 - 3.7 g/dL (calc)   AG Ratio 1.5 1.0 - 2.5 (calc)   Total Bilirubin 0.7 0.2 - 1.2 mg/dL   Alkaline phosphatase (APISO) 86 37 - 153 U/L   AST 25 10 - 35 U/L   ALT 21 6 - 29 U/L  CBC with Differential/Platelet     Status: None   Collection Time: 07/21/21 12:07 PM  Result Value Ref Range   WBC 3.8 3.8 - 10.8 Thousand/uL   RBC 4.47 3.80 - 5.10 Million/uL   Hemoglobin 14.5 11.7 - 15.5 g/dL   HCT 44.0 35.0 - 45.0 %   MCV 98.4 80.0 - 100.0 fL   MCH 32.4 27.0 - 33.0 pg   MCHC 33.0 32.0 - 36.0 g/dL   RDW 11.8 11.0 - 15.0 %   Platelets 152 140 - 400 Thousand/uL   MPV 12.0 7.5 - 12.5 fL   Neutro Abs 1,968 1,500 - 7,800 cells/uL   Lymphs Abs 1,376 850 - 3,900 cells/uL   Absolute Monocytes 274 200 - 950 cells/uL   Eosinophils Absolute 141 15 - 500 cells/uL   Basophils Absolute 42 0 - 200 cells/uL   Neutrophils Relative % 51.8 %   Total Lymphocyte 36.2 %   Monocytes Relative 7.2 %   Eosinophils Relative 3.7 %   Basophils Relative 1.1 %  POCT INR     Status: None   Collection Time: 07/22/21 11:20 AM  Result Value Ref Range   INR 2.0 2.0 - 3.0  POCT INR     Status: Abnormal   Collection Time: 09/02/21 11:15 AM  Result Value Ref Range   INR 1.3 (A) 2.0 - 3.0  POCT INR     Status: None   Collection Time: 09/23/21 12:01 PM  Result Value Ref Range   INR 2.0 2.0 - 3.0    Diabetic Foot Exam:  Diabetic Foot Exam - Simple   No data filed    ***  PHQ2/9:    07/28/2021    8:53 AM 07/21/2021   10:59 AM 12/22/2020    9:14 AM 06/17/2020   11:47 AM 04/10/2020   10:05 AM  Depression screen PHQ 2/9  Decreased Interest 0 0 0 1 1  Down, Depressed, Hopeless '1 1 2 1 1  ' PHQ - 2 Score '1 1 2 2 2  ' Altered sleeping 0 0 1 0 1  Tired, decreased energy 0 0 '2 1 3  ' Change in appetite 0 0 0 0 0  Feeling bad or failure about yourself  0 0 0 0 0  Trouble concentrating 0 0 0 0 0  Moving slowly or  fidgety/restless 0 0 0 0 0  Suicidal thoughts 0 0 0 0 0  PHQ-9 Score '1 1 5 3 6  ' Difficult doing work/chores Not difficult at all Not difficult at all Somewhat difficult Not difficult at all Not difficult at all    phq 9 is {gen pos [id number]} ***  Fall Risk:    07/28/2021    8:55 AM 07/21/2021   10:58 AM 12/22/2020    9:14 AM 06/17/2020   11:51 AM 04/10/2020   10:05 AM  Fall Risk   Falls in the past year? 0 0 0 0 0  Number falls in past yr: 0 0 0 0 0  Injury with Fall? 0 0 0 0 0  Risk for fall due to : No Fall Risks No Fall Risks  No Fall Risks   Follow up Falls prevention discussed   Falls prevention discussed    ***   Functional Status Survey:   ***   Assessment & Plan  *** There are no diagnoses linked to this encounter.

## 2021-10-08 NOTE — Progress Notes (Signed)
? ? ?  Chronic Care Management ?Pharmacy Assistant  ? ?Name: Felicia Poole  MRN: [id number] DOB: 16-Nov-1945 ? ?Opened in Error please Void or disregard ? ?Lynann Bologna, CPA/CMA ?Clinical Pharmacist Assistant ?Phone: [phone]  ? ?

## 2021-10-09 ENCOUNTER — Ambulatory Visit: Payer: Medicare Other | Admitting: Family Medicine

## 2021-10-14 ENCOUNTER — Telehealth: Payer: Self-pay

## 2021-10-14 ENCOUNTER — Telehealth: Payer: Medicare Other

## 2021-10-14 NOTE — Progress Notes (Deleted)
Cardiology Office Note:    Date:  10/14/2021   ID:  Felicia Poole, DOB Jun 30, 1945, MRN [id number]  PCP:  Steele Sizer, MD  [facility] Cardiologist:  None  [facility] Electrophysiologist:  None   Referring MD: Steele Sizer, MD   Chief Complaint: 6 month f/u  History of Present Illness:    Felicia Poole is a 76 y.o. female with a hx of CAD status post one-vessel bypass, atrial myxoma status post resection (2012), nonischemic cardiomyopathy with an EF of 45 to 50%, paroxysmal atrial fibrillation on warfarin and sotalol, tachybradycardia syndrome status post pacemaker, hyperlipidemia, hypertension, and type 2 diabetes mellitus, who presents for follow-up of CAD.   She last underwent ischemic evaluation in August 2014 with nonischemic stress testing performed at [facility].  Echocardiogram performed at that time showed an EF of 45 to 50% with mild diffuse hypokinesis, mild to moderate mitral regurgitation, and moderate tricuspid regurgitation.  In the setting of tachybradycardia syndrome, she underwent permanent pacemaker placement at [facility] in September 2014.  She has been followed closely here in our anticoagulation clinic and last saw Dr. Caryl Comes via telemedicine visit in May 2020.  Last seen 10/07/20 and was volume up with recommendation to take extra lasix.   Today,   Past Medical History:  Diagnosis Date   Atrial myxoma    a. 2012 s/p resection at [facility].   Benign neoplasm of heart    Contact dermatitis and other eczema, due to unspecified cause    Contusion of unspecified site    Coronary atherosclerosis of unspecified type of vessel, native or graft    a. 08/2010 s/p CABG x 1 (LIMA->LAD @ time of atrial myxoma resection ([facility]); b. 01/2013 MV: No ischemia/infarct. EF 71%.   Cramp of limb    Depression    Essential hypertension, benign    Heartburn    Hyperlipidemia LDL goal <70    Lumbago    Nonischemic cardiomyopathy ([place])    a. EF: 45% in 2012; b. 01/2013 Echo: EF  45-50%, mild diff HK. Mild to mod MR. Mild BAE. Mod TR.   Osteoporosis, unspecified    Other specified cardiac dysrhythmias(427.89)    PAF (paroxysmal atrial fibrillation) (H[facility])    a. CHA2DS2VASc = 6-->warfarin.  Rhythm management with sotalol.   Postsurgical percutaneous transluminal coronary angioplasty status    Tachy-brady syndrome ([place])    a. 02/2013 s/p MDT [id number] Advisa DR MRI DC PPM.   Type II or unspecified type diabetes mellitus without mention of complication, not stated as uncontrolled    Unspecified hypothyroidism    Unspecified menopausal and postmenopausal disorder     Past Surgical History:  Procedure Laterality Date   ATRIAL MYXOMA EXCISION Left    CARDIAC CATHETERIZATION  2012   [facility]; right and left heart 75% ostial LAD lesion   CORONARY ARTERY BYPASS GRAFT  2011   [facility]   INSERT / REPLACE / REMOVE PACEMAKER  02/2013   Dual chamber MDT advisa pacemaker. MVP-R70   VAGINAL HYSTERECTOMY      Current Medications: No outpatient medications have been marked as taking for the 10/16/21 encounter (Appointment) with Kathlen Mody, Xan Sparkman H, PA-C.     Allergies:   Patient has no known allergies.   Social History   Socioeconomic History   Marital status: Widowed    Spouse name: Gwenlyn Perking   Number of children: 5   Years of education: Not on file   Highest education level: 9th grade  Occupational History    Employer: DISABLED  Tobacco Use   Smoking status: Never   Smokeless tobacco: Never   Tobacco comments:    smoking cessation materials not required  Vaping Use   Vaping Use: Never used  Substance and Sexual Activity   Alcohol use: No   Drug use: No   Sexual activity: Never  Other Topics Concern   Not on file  Social History Narrative   Pt lives with son, daughter [name], son in Sports coach, granddaughter   Social Determinants of Health   Financial Resource Strain: Low Risk    Difficulty of Paying Living Expenses: Not hard at all  Food Insecurity: No Food Insecurity    Worried About Charity fundraiser in the Last Year: Never true   Arboriculturist in the Last Year: Never true  Transportation Needs: No Transportation Needs   Lack of Transportation (Medical): No   Lack of Transportation (Non-Medical): No  Physical Activity: Inactive   Days of Exercise per Week: 0 days   Minutes of Exercise per Session: 0 min  Stress: No Stress Concern Present   Feeling of Stress : Only a little  Social Connections: Socially Isolated   Frequency of Communication with Friends and Family: More than three times a week   Frequency of Social Gatherings with Friends and Family: More than three times a week   Attends Religious Services: Never   Marine scientist or Organizations: No   Attends Archivist Meetings: Never   Marital Status: Widowed     Family History: The patient's family history includes Cirrhosis in her mother; Heart attack in her father; Heart failure in her mother; Hyperlipidemia in her father; Hypertension in her father; Stroke in her father.  ROS:   Please see the history of present illness.     All other systems reviewed and are negative.  EKGs/Labs/Other Studies Reviewed:    The following studies were reviewed today:  Echo 09/2019  1. Left ventricular ejection fraction, by estimation, is 35 to 40%. The  left ventricle has moderately decreased function. The left ventricle  demonstrates global hypokinesis. Left ventricular diastolic parameters are  indeterminate.   2. Right ventricular systolic function is low normal. The right  ventricular size is moderately enlarged. There is moderately elevated  pulmonary artery systolic pressure. The estimated right ventricular  systolic pressure is 89.3 mmHg.   3. Left atrial size was mild to moderately dilated.   4. Mild to moderate mitral valve regurgitation.   5. Tricuspid valve regurgitation is severe.   6. The inferior vena cava is dilated in size with <50% respiratory  variability,  suggesting right atrial pressure of 15 mmHg.   EKG:  EKG is *** ordered today.  The ekg ordered today demonstrates ***  Recent Labs: 07/21/2021: ALT 21; BUN 14; Creat 1.05; Hemoglobin 14.5; Platelets 152; Potassium 4.2; Sodium 142; TSH 10.68  Recent Lipid Panel    Component Value Date/Time   CHOL 203 (H) 07/21/2021 1207   CHOL 228 (H) 04/29/2015 1210   TRIG 101 07/21/2021 1207   HDL 57 07/21/2021 1207   HDL 46 04/29/2015 1210   CHOLHDL 3.6 07/21/2021 1207   LDLCALC 126 (H) 07/21/2021 1207     Risk Assessment/Calculations:   {Does this patient have ATRIAL FIBRILLATION?:[phone]}   Physical Exam:    VS:  There were no vitals taken for this visit.    Wt Readings from Last 3 Encounters:  07/21/21 127 lb 1.6 oz (57.7 kg)  12/22/20 134 lb (60.8 kg)  10/07/20 136 lb (61.7 kg)     GEN: *** Well nourished, well developed in no acute distress HEENT: Normal NECK: No JVD; No carotid bruits LYMPHATICS: No lymphadenopathy CARDIAC: ***RRR, no murmurs, rubs, gallops RESPIRATORY:  Clear to auscultation without rales, wheezing or rhonchi  ABDOMEN: Soft, non-tender, non-distended MUSCULOSKELETAL:  No edema; No deformity  SKIN: Warm and dry NEUROLOGIC:  Alert and oriented x 3 PSYCHIATRIC:  Normal affect   ASSESSMENT:    No diagnosis found. PLAN:    In order of problems listed above:  HFpEF  Sinus node dysfunction S/p PPM  Afib  Atrial Myxoma  HTN  CADs/p CABG  HTN   Disposition: Follow up {follow up:15908} with ***   Shared Decision Making/Informed Consent   {Are you ordering a CV Procedure (e.g. stress test, cath, DCCV, TEE, etc)?   Press F2        :[id number]}    Signed, Marchia Diguglielmo Ninfa Meeker, PA-C  10/14/2021 10:22 AM    [facility]

## 2021-10-14 NOTE — Telephone Encounter (Signed)
?  Care Management  ? ?Follow Up Note ? ? ?10/14/2021 ?Name: Felicia Poole MRN: [id number] DOB: Oct 21, 1945 ? ? ?Referred by: Steele Sizer, MD ?Reason for referral : No chief complaint on file. ? ? ?A second unsuccessful telephone outreach was attempted today. The patient was referred to the case management team for assistance with care management and care coordination.  ? ?Follow Up Plan: A HIPPA compliant phone message was left for the patient providing contact information and requesting a return call.  ? ?Doristine Section, BCACP, CPP ?Clinical Pharmacist Practitioner  ?[facility] ?[phone] ? ?

## 2021-10-14 NOTE — Progress Notes (Deleted)
Chronic Care Management Pharmacy Note  10/14/2021 Name:  Felicia Poole MRN:  [id number] DOB:  21-Nov-1945  Summary: ***  Recommendations/Changes made from today's visit: ***  Plan: ***   Subjective: Felicia Poole is an 75 y.o. year old female who is a primary patient of Steele Sizer, MD.  The CCM team was consulted for assistance with disease management and care coordination needs.    Engaged with patient by telephone for initial visit in response to provider referral for pharmacy case management and/or care coordination services.   Consent to Services:  The patient was given the following information about Chronic Care Management services today, agreed to services, and gave verbal consent: 1. CCM service includes personalized support from designated clinical staff supervised by the primary care provider, including individualized plan of care and coordination with other care providers 2. 24/7 contact phone numbers for assistance for urgent and routine care needs. 3. Service will only be billed when office clinical staff spend 20 minutes or more in a month to coordinate care. 4. Only one practitioner may furnish and bill the service in a calendar month. 5.The patient may stop CCM services at any time (effective at the end of the month) by phone call to the office staff. 6. The patient will be responsible for cost sharing (co-pay) of up to 20% of the service fee (after annual deductible is met). Patient agreed to services and consent obtained.  Patient Care Team: Steele Sizer, MD as PCP - General (Family Medicine) Deboraha Sprang, MD as Consulting Physician (Cardiology) Germaine Pomfret, [facility] (Pharmacist) Neldon Labella, RN as Case Manager Minna Merritts, MD as Consulting Physician (Cardiology)  Recent office visits: 07/29/2021 Neldon Labella RN (CCM) No Medication Changes noted 07/28/2021 Clemetine Marker LPN (PCP Office) Medicare Wellness noted 07/21/2021 Dr. Ancil Boozer MD  (PCP) No Medication Changes noted, AMB Referral to [facility] Coordination, Return in about 1 month   Recent consult visits: No Recent consult visit   Hospital visits: None in previous 6 months   Objective:  Lab Results  Component Value Date   CREATININE 1.05 (H) 07/21/2021   BUN 14 07/21/2021   EGFR 55 (L) 07/21/2021   GFRNONAA 63 04/10/2020   GFRAA 72 04/10/2020   NA 142 07/21/2021   K 4.2 07/21/2021   CALCIUM 9.8 07/21/2021   CO2 25 07/21/2021   GLUCOSE 76 07/21/2021    Lab Results  Component Value Date/Time   HGBA1C 5.9 (H) 12/22/2020 09:51 AM   HGBA1C 6.0 (H) 04/10/2020 11:04 AM   MICROALBUR 100 04/29/2015 10:26 AM    Last diabetic Eye exam: No results found for: HMDIABEYEEXA  Last diabetic Foot exam: No results found for: HMDIABFOOTEX   Lab Results  Component Value Date   CHOL 203 (H) 07/21/2021   HDL 57 07/21/2021   LDLCALC 126 (H) 07/21/2021   TRIG 101 07/21/2021   CHOLHDL 3.6 07/21/2021       Latest Ref Rng & Units 07/21/2021   12:07 PM 12/22/2020    9:51 AM 04/10/2020   11:04 AM  Hepatic Function  Total Protein 6.1 - 8.1 g/dL 6.9   6.4   6.4    AST 10 - 35 U/L 25   27   44    ALT 6 - 29 U/L 21   18   34    Total Bilirubin 0.2 - 1.2 mg/dL 0.7   0.5   0.4      Lab Results  Component Value Date/Time  TSH 10.68 (H) 07/21/2021 12:07 PM   TSH 0.37 (L) 12/22/2020 09:51 AM       Latest Ref Rng & Units 07/21/2021   12:07 PM 04/10/2020   11:04 AM 03/04/2020    1:42 PM  CBC  WBC 3.8 - 10.8 Thousand/uL 3.8   4.2   4.3    Hemoglobin 11.7 - 15.5 g/dL 14.5   13.1   12.6    Hematocrit 35.0 - 45.0 % 44.0   39.4   37.8    Platelets 140 - 400 Thousand/uL 152   141   147      Lab Results  Component Value Date/Time   VD25OH 71 04/10/2020 11:04 AM    Clinical ASCVD: No  The 10-year ASCVD risk score (Arnett DK, et al., 2019) is: 31.3%   Values used to calculate the score:     Age: 31 years     Sex: Female     Is Non-Hispanic African American: No      Diabetic: No     Tobacco smoker: No     Systolic Blood Pressure: 056 mmHg     Is BP treated: Yes     HDL Cholesterol: 57 mg/dL     Total Cholesterol: 203 mg/dL       07/28/2021    8:53 AM 07/21/2021   10:59 AM 12/22/2020    9:14 AM  Depression screen PHQ 2/9  Decreased Interest 0 0 0  Down, Depressed, Hopeless _0 PHQ - 2 Score _1 Altered sleeping 0 0 1  Tired, decreased energy 0 0 2  Change in appetite 0 0 0  Feeling bad or failure about yourself  0 0 0  Trouble concentrating 0 0 0  Moving slowly or fidgety/restless 0 0 0  Suicidal thoughts 0 0 0  PHQ-9 Score _2 Difficult doing work/chores Not difficult at all Not difficult at all Somewhat difficult     ***Other: (CHADS2VASc if Afib, MMRC or CAT for COPD, ACT, DEXA)  Social History   Tobacco Use  Smoking Status Never  Smokeless Tobacco Never  Tobacco Comments   smoking cessation materials not required   BP Readings from Last 3 Encounters:  07/21/21 (!) 152/88  12/22/20 130/74  10/07/20 (!) 145/97   Pulse Readings from Last 3 Encounters:  07/21/21 81  12/22/20 77  10/07/20 73   Wt Readings from Last 3 Encounters:  07/21/21 127 lb 1.6 oz (57.7 kg)  12/22/20 134 lb (60.8 kg)  10/07/20 136 lb (61.7 kg)   BMI Readings from Last 3 Encounters:  07/21/21 25.67 kg/m  12/22/20 27.06 kg/m  10/07/20 27.47 kg/m    Assessment/Interventions: Review of patient past medical history, allergies, medications, health status, including review of consultants reports, laboratory and other test data, was performed as part of comprehensive evaluation and provision of chronic care management services.   SDOH:  (Social Determinants of Health) assessments and interventions performed: Yes  SDOH Screenings   Alcohol Screen: Low Risk    Last Alcohol Screening Score (AUDIT): 0  Depression (PHQ2-9): Low Risk    PHQ-2 Score: 1  Financial Resource Strain: Low Risk    Difficulty of Paying Living Expenses: Not hard at all   Food Insecurity: No Food Insecurity   Worried About Charity fundraiser in the Last Year: Never true   Ran Out of Food in the Last Year: Never true  Housing: [place]  Score: 0  Physical Activity: Inactive   Days of Exercise per Week: 0 days   Minutes of Exercise per Session: 0 min  Social Connections: Socially Isolated   Frequency of Communication with Friends and Family: More than three times a week   Frequency of Social Gatherings with Friends and Family: More than three times a week   Attends Religious Services: Never   Marine scientist or Organizations: No   Attends Archivist Meetings: Never   Marital Status: Widowed  Stress: No Stress Concern Present   Feeling of Stress : Only a little  Tobacco Use: Low Risk    Smoking Tobacco Use: Never   Smokeless Tobacco Use: Never   Passive Exposure: Not on file  Transportation Needs: No Transportation Needs   Lack of Transportation (Medical): No   Lack of Transportation (Non-Medical): No    CCM Care Plan  No Known Allergies  Medications Reviewed Today     Reviewed by Clemetine Marker, LPN (Licensed Practical Nurse) on 07/28/21 at [phone]  Med List Status: <None>   Medication Order Taking? Sig Documenting Provider Last Dose Status Informant  alendronate (FOSAMAX) 70 MG tablet [id number] Yes TAKE 1 TABLET (70 MG TOTAL) BY MOUTH ONCE A WEEK. WITH FULL GLASS OF WATER AND DO NOT RECLINE Sowles, Drue Stager, MD Taking Active   amLODipine (NORVASC) 10 MG tablet [id number] Yes Take 1 tablet (10 mg total) by mouth daily. Minna Merritts, MD Taking Active   atorvastatin (LIPITOR) 80 MG tablet [id number] Yes TAKE 1 TABLET BY MOUTH EVERY DAY Deboraha Sprang, MD Taking Active   chlorthalidone (HYGROTON) 25 MG tablet [id number] Yes Take 1 tablet (25 mg total) by mouth daily. Minna Merritts, MD Taking Active   FLUoxetine (PROZAC) 10 MG capsule [id number] Yes Take 1 capsule (10 mg total) by mouth daily. Steele Sizer,  MD Taking Active   fluticasone [facility]) 50 MCG/ACT nasal spray [id number] Yes SPRAY 2 SPRAYS IN EACH NOSTRIL AT BEDTIME Steele Sizer, MD Taking Active   furosemide (LASIX) 40 MG tablet [id number] Yes Take 1 tablet (40 mg total) by mouth as needed. Take at least once a week Minna Merritts, MD Taking Active   Incontinence Supply Disposable ([facility] BLADDER CONTROL PAD) MISC [id number] Yes 1 each by Does not apply route daily. Steele Sizer, MD Taking Active            Med Note Clemetine Marker D   Tue Jul 28, 2021  8:51 AM) Pt getting incontinence supplies through [facility]  levothyroxine (SYNTHROID) 50 MCG tablet [id number] Yes Take 1-2 tablets (50-100 mcg total) by mouth daily. Recheck in 6 weeks Steele Sizer, MD Taking Active   lisinopril (PRINIVIL,ZESTRIL) 20 MG tablet [id number] Yes TAKE 1 TABLET (20 MG TOTAL) BY MOUTH DAILY. Minna Merritts, MD Taking Active   OS-CAL CALCIUM + D3 500-200 MG-UNIT TABS [id number] No TAKE 1 TABLET BY MOUTH EVERY DAY  Patient not taking: Reported on 07/28/2021   Fredderick Severance, NP Not Taking Active   sotalol (BETAPACE) 80 MG tablet [id number] Yes Take 1 tablet (80 mg total) by mouth 2 (two) times daily. Minna Merritts, MD Taking Active   Vitamin D, Ergocalciferol, (DRISDOL) 1.25 MG (50000 UNIT) CAPS capsule [id number] No TAKE 1 CAPSULE (50,000 UNITS TOTAL) BY MOUTH EVERY 7 (SEVEN) DAYS.  Patient not taking: Reported on 07/28/2021   Steele Sizer, MD Not Taking Active   warfarin (COUMADIN) 2.5 MG tablet [id number] Yes TAKE AS DIRECTED BY THE  [facility] [place], MD Taking Active             Patient Active Problem List   Diagnosis Date Noted   Major depression, recurrent, chronic ([place]) 04/10/2020   Stage 3a chronic kidney disease ([place]) 04/10/2020   Age-related osteoporosis without current pathological fracture 04/10/2020   Thrombocytopenia ([place]) 04/10/2020   Angina pectoris ([place]) 04/11/2017   Senile purpura ([place])  04/29/2015   Chronic anticoagulation 04/29/2015   Obesity 12/10/2014   Hypercholesterolemia 10/15/2013   Myoma 10/15/2013   Encounter for therapeutic drug monitoring 08/08/2013   Pacemaker -Medtronic 05/24/2013   Nonischemic cardiomyopathy ([place])    Atrial fibrillation ([place]) 02/28/2013   Arteriosclerosis of coronary artery 02/22/2013   Adult hypothyroidism 02/22/2013   Congestive heart failure with left ventricular systolic dysfunction ([place]) 02/22/2013   Tachycardia-bradycardia ([place]) 02/22/2013   Essential hypertension, benign    Atrial myxoma    Coronary atherosclerosis     Immunization History  Administered Date(s) Administered   Fluad Quad(high Dose 65+) 02/05/2019   Influenza, High Dose Seasonal PF 04/29/2015   Pneumococcal Conjugate-13 04/29/2015   Pneumococcal Polysaccharide-23 02/17/2011   Tdap 04/28/2012    Conditions to be addressed/monitored:  Hypertension, Hyperlipidemia, Atrial Fibrillation, Chronic Kidney Disease, Hypothyroidism, and Osteoporosis  There are no care plans that you recently modified to display for this patient.    Medication Assistance: {MEDASSISTANCEINFO:25044}  Compliance/Adherence/Medication fill history: Care Gaps: Covid- 19 Vaccine  Star-Rating Drugs: Atorvastatin 80 mg last filled 04/01/[facility]. Lisinopril 20 mg last filled 09/19/2018 [facility].  Patient's preferred pharmacy is:  [facility] [address]), [place] [address] [place] ([place]) [place] [address] Phone: [phone] Fax: [phone]  Uses pill box? {Yes or If no, why not?:20788} Pt endorses ***% compliance  We discussed: {Pharmacy options:24294} Patient decided to: {US Pharmacy Plan:23885}  Care Plan and Follow Up Patient Decision:  {FOLLOWUP:24991}  Plan: {CM FOLLOW UP NGEX:52841}  ***  Current Barriers:  {pharmacybarriers:24917}  Pharmacist Clinical Goal(s):   Patient will {PHARMACYGOALCHOICES:24921} through collaboration with PharmD and provider.   Interventions: 1:1 collaboration with Steele Sizer, MD regarding development and update of comprehensive plan of care as evidenced by provider attestation and co-signature Inter-disciplinary care team collaboration (see longitudinal plan of care) Comprehensive medication review performed; medication list updated in electronic medical record  Hypertension (BP goal {CHL HP UPSTREAM Pharmacist BP ranges:[phone]}) -{US controlled/uncontrolled:25276} -Current treatment: Amlodipine 10 mg daily  Chlorthalidone 25 mg daily  Furosemide 40 mg daily as needed  Lisinopril 20 mg daily  Sotalol 80 mg twice daily  -Medications previously tried: ***  -Current home readings: *** -Current dietary habits: *** -Current exercise habits: *** -{ACTIONS;DENIES/REPORTS:21021675::"Denies"} hypotensive/hypertensive symptoms -Educated on {CCM BP Counseling:25124} -Counseled to monitor BP at home ***, document, and provide log at future appointments -{CCMPHARMDINTERVENTION:25122}  Hyperlipidemia: (LDL goal < ***) -{US controlled/uncontrolled:25276} -Current treatment: Atorvastatin 80 mg daily  -Medications previously tried: ***  -Current dietary patterns: *** -Current exercise habits: *** -Educated on {CCM HLD Counseling:25126} -{CCMPHARMDINTERVENTION:25122}  Atrial Fibrillation (Goal: prevent stroke and major bleeding) -{US controlled/uncontrolled:25276} -CHADSVASC: *** -Current treatment: Rate control: Sotalol 80 mg twice daily  Anticoagulation: Warfarin 2.5 mg 1-1.5 tablets daily -Medications previously tried: *** -Home BP and HR readings: ***  -Counseled on {CCMAFIBCOUNSELING:25120} -{CCMPHARMDINTERVENTION:25122}  Osteoporosis / Osteopenia (Goal ***) -{US controlled/uncontrolled:25276} -Last DEXA Scan: ***   T-Score femoral neck: ***  T-Score total hip: ***  T-Score lumbar spine: ***  T-Score  forearm radius: ***  10-year probability of major osteoporotic fracture: ***  10-year probability of hip fracture: *** -Patient {is;is not an osteoporosis candidate:23886} -Current treatment  Alendronate 70 mg weekly  Calcium +D3  Ergocalciferol 50,000 units weekly  -Medications previously tried: ***  -{Osteoporosis Counseling:23892} -{CCMPHARMDINTERVENTION:25122}  *** (Goal: ***) -{US controlled/uncontrolled:25276} -Current treatment  Levotyhroxine 50 mcg 1-2 tablets daily  -Medications previously tried: ***  -{CCMPHARMDINTERVENTION:25122}  Patient Goals/Self-Care Activities Patient will:  - {pharmacypatientgoals:24919}  Follow Up Plan: {CM FOLLOW UP BPZW:25852}

## 2021-10-16 ENCOUNTER — Ambulatory Visit: Payer: Medicare Other | Admitting: Medical

## 2021-10-22 ENCOUNTER — Ambulatory Visit: Payer: Self-pay

## 2021-10-22 ENCOUNTER — Telehealth: Payer: Self-pay | Admitting: Family Medicine

## 2021-10-22 ENCOUNTER — Telehealth: Payer: Self-pay

## 2021-10-22 DIAGNOSIS — E7800 Pure hypercholesterolemia, unspecified: Secondary | ICD-10-CM

## 2021-10-22 DIAGNOSIS — M8000XA Age-related osteoporosis with current pathological fracture, unspecified site, initial encounter for fracture: Secondary | ICD-10-CM

## 2021-10-22 DIAGNOSIS — I159 Secondary hypertension, unspecified: Secondary | ICD-10-CM

## 2021-10-22 DIAGNOSIS — E039 Hypothyroidism, unspecified: Secondary | ICD-10-CM

## 2021-10-22 DIAGNOSIS — F339 Major depressive disorder, recurrent, unspecified: Secondary | ICD-10-CM

## 2021-10-22 MED ORDER — FLUOXETINE HCL 10 MG PO CAPS: 10.0000 mg | ORAL_CAPSULE | Freq: Every day | ORAL | 0 refills | Status: DC

## 2021-10-22 MED ORDER — LEVOTHYROXINE SODIUM 50 MCG PO TABS: 50.0000 ug | ORAL_TABLET | Freq: Every day | ORAL | 0 refills | Status: DC

## 2021-10-22 NOTE — Telephone Encounter (Signed)
  Chief Complaint: med refill Symptoms: NA Frequency: NA Pertinent Negatives: NA Disposition: '[]'$ ED /'[]'$ Urgent Care (no appt availability in office) / '[x]'$ Appointment(In office/virtual)/ '[]'$  [facility]/ '[]'$ Home Care/ '[]'$ Refused Recommended Disposition /'[]'$ [facility]/ '[]'$  Follow-up with PCP Additional Notes: [name] pt's caregiver called in to refill medications and several medications pt hasn't had refilled since 2020 or 2021 but she states pt still takes her medications. She also wanted to schedule pt for OV if she was due. Appt scheduled for 10/30/21 at 1020 with [place], PA. Will send refill requests to provider.   Reason for Disposition  [1] Prescription refill request for ESSENTIAL medicine (i.e., likelihood of harm to patient if not taken) AND [2] triager unable to refill per department policy  Protocols used: Medication Refill and Renewal Call-A-AH

## 2021-10-22 NOTE — Telephone Encounter (Signed)
error 

## 2021-10-22 NOTE — Telephone Encounter (Signed)
Requested medication (s) are due for refill today: yes  Requested medication (s) are on the active medication list: yes  Last refill:  fosamax 02/06/21, amlodipine, chlorthalidone, and lisinopril 09/19/18, atorvastatin 04/01/20, and furosemide 10/07/20  Future visit scheduled: yes for 10/30/21  Notes to clinic:  Unable to refill per protocol due to failed labs, no updated results and Unable to refill per protocol, last refill by another provider.     Requested Prescriptions  Pending Prescriptions Disp Refills   alendronate (FOSAMAX) 70 MG tablet 12 tablet 1    Sig: Take 1 tablet (70 mg total) by mouth once a week. With full glass of water and do not recline     Endocrinology:  Bisphosphonates Failed - 10/22/2021  3:15 PM      Failed - Vitamin D in normal range and within 360 days    Vit D, 25-Hydroxy  Date Value Ref Range Status  04/10/2020 71 30 - 100 ng/mL Final    Comment:    Vitamin D Status         25-OH Vitamin D: . Deficiency:                    <20 ng/mL Insufficiency:             20 - 29 ng/mL Optimal:                 > or = 30 ng/mL . For 25-OH Vitamin D testing on patients on  D2-supplementation and patients for whom quantitation  of D2 and D3 fractions is required, the QuestAssureD(TM) 25-OH VIT D, (D2,D3), LC/MS/MS is recommended: order  code [phone] (patients >29yr). See Note 1 . Note 1 . For additional information, please refer to  http://education.[facility].com/faq/FAQ199  (This link is being provided for informational/ educational purposes only.)          Failed - Cr in normal range and within 360 days    Creat  Date Value Ref Range Status  07/21/2021 1.05 (H) 0.60 - 1.00 mg/dL Final         Failed - Mg Level in normal range and within 360 days    Magnesium  Date Value Ref Range Status  03/04/2020 1.8 1.6 - 2.3 mg/dL Final         Failed - Phosphate in normal range and within 360 days    No results found for: PHOS       Failed - Bone Mineral  Density or Dexa Scan completed in the last 2 years      Passed - Ca in normal range and within 360 days    Calcium  Date Value Ref Range Status  07/21/2021 9.8 8.6 - 10.4 mg/dL Final         Passed - eGFR is 30 or above and within 360 days    GFR, Est African American  Date Value Ref Range Status  04/10/2020 72 > OR = 60 mL/min/1.791mFinal   GFR, Est Non African American  Date Value Ref Range Status  04/10/2020 63 > OR = 60 mL/min/1.7335minal   eGFR  Date Value Ref Range Status  07/21/2021 55 (L) > OR = 60 mL/min/1.92m9mnal    Comment:    The eGFR is based on the CKD-EPI 2021 equation. To calculate  the new eGFR from a previous Creatinine or Cystatin C result, go to https://www.kidney.org/professionals/ kdoqi/gfr%5Fcalculator          Passed - Valid encounter within last 12  months    Recent Outpatient Visits           3 months ago Nonischemic cardiomyopathy [facility])   [facility] [place], Drue Stager, MD   10 months ago Congestive heart failure with left ventricular systolic dysfunction [facility])   [facility] Sizer, MD   1 year ago Senile purpura [facility])   [facility] Sizer, MD   2 years ago GERD without esophagitis   [facility], [facility]   2 years ago Major depression, recurrent, chronic [facility])   [facility]izer, MD       Future Appointments             In 1 week Delsa Grana, PA-C [facility], [place]   In 1 month Furth, Cadence H, PA-C [place], [facility]   In 9 months  [facility]              amLODipine (NORVASC) 10 MG tablet 90 tablet 3    Sig: Take 1 tablet (10 mg total) by mouth daily.     Cardiovascular: Calcium Channel Blockers 2 Failed - 10/22/2021  3:15 PM      Failed - Last BP in normal range    BP Readings from Last 1 Encounters:  07/21/21 (!)  152/88         Passed - Last Heart Rate in normal range    Pulse Readings from Last 1 Encounters:  07/21/21 81         Passed - Valid encounter within last 6 months    Recent Outpatient Visits           3 months ago Nonischemic cardiomyopathy [facility])   [facility] [place], Drue Stager, MD   10 months ago Congestive heart failure with left ventricular systolic dysfunction [facility])   [facility]izer, MD   1 year ago Senile purpura [facility])   [facility], [facility]   2 years ago GERD without esophagitis   [facility], MD   2 years ago Major depression, recurrent, chronic [facility])   [facility], MD       Future Appointments             In 1 week Delsa Grana, PA-C [facility], [place]   In 1 month Furth, Cadence H, PA-C [place], [facility]   In 9 months  [facility]              atorvastatin (LIPITOR) 80 MG tablet 30 tablet 6    Sig: Take 1 tablet (80 mg total) by mouth daily.     Cardiovascular:  Antilipid - Statins Failed - 10/22/2021  3:15 PM      Failed - Lipid Panel in normal range within the last 12 months    Cholesterol, Total  Date Value Ref Range Status  04/29/2015 228 (H) 100 - 199 mg/dL Final   Cholesterol  Date Value Ref Range Status  07/21/2021 203 (H) <200 mg/dL Final   LDL Cholesterol (Calc)  Date Value Ref Range Status  07/21/2021 126 (H) mg/dL (calc) Final    Comment:    Reference range: <100 . Desirable range <100 mg/dL for primary prevention;   <70 mg/dL for patients with CHD or diabetic patients  with > or = 2 CHD  risk factors. Marland Kitchen LDL-C is now calculated using the Martin-Hopkins  calculation, which is a validated novel method providing  better accuracy than the Friedewald equation in the  estimation of LDL-C.  Cresenciano Genre et al. Annamaria Helling.  8453;646(80): 2061-2068  (http://education.[facility].com/faq/FAQ164)    HDL  Date Value Ref Range Status  07/21/2021 57 > OR = 50 mg/dL Final  04/29/2015 46 >39 mg/dL Final   Triglycerides  Date Value Ref Range Status  07/21/2021 101 <150 mg/dL Final         Passed - Patient is not pregnant      Passed - Valid encounter within last 12 months    Recent Outpatient Visits           3 months ago Nonischemic cardiomyopathy [facility])   [facility] [place], Drue Stager, MD   10 months ago Congestive heart failure with left ventricular systolic dysfunction [facility])   [facility] Sizer, MD   1 year ago Senile purpura [facility])   [facility]izer, MD   2 years ago GERD without esophagitis   [facility]izer, MD   2 years ago Major depression, recurrent, chronic [facility])   [facility]izer, MD       Future Appointments             In 1 week Delsa Grana, PA-C [facility], [place]   In 1 month Furth, Cadence H, PA-C [place], [facility]   In 9 months  [facility], [facility]              chlorthalidone (HYGROTON) 25 MG tablet 90 tablet 3    Sig: Take 1 tablet (25 mg total) by mouth daily.     Cardiovascular: Diuretics - Thiazide Failed - 10/22/2021  3:15 PM      Failed - Cr in normal range and within 180 days    Creat  Date Value Ref Range Status  07/21/2021 1.05 (H) 0.60 - 1.00 mg/dL Final         Failed - Last BP in normal range    BP Readings from Last 1 Encounters:  07/21/21 (!) 152/88         Passed - K in normal range and within 180 days    Potassium  Date Value Ref Range Status  07/21/2021 4.2 3.5 - 5.3 mmol/L Final         Passed - Na in normal range and within 180 days    Sodium  Date Value Ref Range Status  07/21/2021 142 135 - 146 mmol/L Final  03/04/2020 141 134 - 144  mmol/L Final         Passed - Valid encounter within last 6 months    Recent Outpatient Visits           3 months ago Nonischemic cardiomyopathy [facility])   [facility]izer, MD   10 months ago Congestive heart failure with left ventricular systolic dysfunction [facility])   [facility]izer, MD   1 year ago Senile purpura [facility])   [facility] Steele Sizer, MD   2 years ago GERD without esophagitis   [facility], [facility]   2 years ago Major depression, recurrent, chronic [facility])   [facility]izer, MD       Future Appointments  In 1 week Delsa Grana, PA-C [facility], [facility]   In 1 month Furth, Cadence H, PA-C [place], [facility]   In 9 months  [facility], [facility]              furosemide (LASIX) 40 MG tablet 90 tablet 1    Sig: Take 1 tablet (40 mg total) by mouth as needed. Take at least once a week     Cardiovascular:  Diuretics - Loop Failed - 10/22/2021  3:15 PM      Failed - Cr in normal range and within 180 days    Creat  Date Value Ref Range Status  07/21/2021 1.05 (H) 0.60 - 1.00 mg/dL Final         Failed - Mg Level in normal range and within 180 days    Magnesium  Date Value Ref Range Status  03/04/2020 1.8 1.6 - 2.3 mg/dL Final         Failed - Last BP in normal range    BP Readings from Last 1 Encounters:  07/21/21 (!) 152/88         Passed - K in normal range and within 180 days    Potassium  Date Value Ref Range Status  07/21/2021 4.2 3.5 - 5.3 mmol/L Final         Passed - Ca in normal range and within 180 days    Calcium  Date Value Ref Range Status  07/21/2021 9.8 8.6 - 10.4 mg/dL Final         Passed - Na in normal range and within 180 days    Sodium  Date Value Ref Range Status  07/21/2021 142 135 - 146 mmol/L Final  03/04/2020 141  134 - 144 mmol/L Final         Passed - Cl in normal range and within 180 days    Chloride  Date Value Ref Range Status  07/21/2021 105 98 - 110 mmol/L Final         Passed - Valid encounter within last 6 months    Recent Outpatient Visits           3 months ago Nonischemic cardiomyopathy [facility])   [facility], Drue Stager, MD   10 months ago Congestive heart failure with left ventricular systolic dysfunction [facility])   [facility], MD   1 year ago Senile purpura [facility])   [facility], [facility]   2 years ago GERD without esophagitis   [facility], MD   2 years ago Major depression, recurrent, chronic [facility])   [facility], MD       Future Appointments             In 1 week Delsa Grana, PA-C [facility], [place]   In 1 month Furth, Cadence H, PA-C [facility], [facility]   In 9 months  [facility], [facility]              lisinopril (ZESTRIL) 20 MG tablet 90 tablet 3    Sig: TAKE 1 TABLET (20 MG TOTAL) BY MOUTH DAILY.     Cardiovascular:  ACE Inhibitors Failed - 10/22/2021  3:15 PM      Failed - Cr in normal range and within 180 days    Creat  Date Value Ref Range Status  07/21/2021 1.05 (H) 0.60 -  1.00 mg/dL Final         Failed - Last BP in normal range    BP Readings from Last 1 Encounters:  07/21/21 (!) 152/88         Passed - K in normal range and within 180 days    Potassium  Date Value Ref Range Status  07/21/2021 4.2 3.5 - 5.3 mmol/L Final         Passed - Patient is not pregnant      Passed - Valid encounter within last 6 months    Recent Outpatient Visits           3 months ago Nonischemic cardiomyopathy [facility])   [facility] [place], Drue Stager, MD   10 months ago Congestive heart failure with left ventricular systolic dysfunction  [facility])   [facility]izer, MD   1 year ago Senile purpura [facility])   [facility], [facility]   2 years ago GERD without esophagitis   [facility], MD   2 years ago Major depression, recurrent, chronic [facility])   [facility] Sizer, MD       Future Appointments             In 1 week Delsa Grana, PA-C [facility], [place]   In 1 month Furth, Cadence H, PA-C [place], [facility]   In 9 months  [facility]             Signed Prescriptions Disp Refills   FLUoxetine (PROZAC) 10 MG capsule 90 capsule 0    Sig: Take 1 capsule (10 mg total) by mouth daily.     Psychiatry:  Antidepressants - SSRI Passed - 10/22/2021  3:15 PM      Passed - Completed PHQ-2 or PHQ-9 in the last 360 days      Passed - Valid encounter within last 6 months    Recent Outpatient Visits           3 months ago Nonischemic cardiomyopathy [facility])   [facility] [place], Drue Stager, MD   10 months ago Congestive heart failure with left ventricular systolic dysfunction [facility])   [facility]izer, MD   1 year ago Senile purpura [facility])   [facility]izer, MD   2 years ago GERD without esophagitis   [facility], MD   2 years ago Major depression, recurrent, chronic [facility])   [facility]izer, MD       Future Appointments             In 1 week Delsa Grana, PA-C [facility], [place]   In 1 month Furth, Cadence H, PA-C [place], [facility]   In 9 months  [facility]              levothyroxine (SYNTHROID) 50 MCG tablet 180 tablet 0    Sig: Take 1-2 tablets (50-100 mcg total) by mouth daily. Recheck in 6 weeks     Endocrinology:   Hypothyroid Agents Failed - 10/22/2021  3:15 PM      Failed - TSH in normal range and within 360 days    TSH  Date Value Ref Range Status  07/21/2021 10.68 (H) 0.40 - 4.50 mIU/L Final         Passed - Valid  encounter within last 12 months    Recent Outpatient Visits           3 months ago Nonischemic cardiomyopathy [facility])   [facility] [place], Drue Stager, MD   10 months ago Congestive heart failure with left ventricular systolic dysfunction [facility])   [facility]izer, MD   1 year ago Senile purpura [facility])   [facility]izer, MD   2 years ago GERD without esophagitis   [facility], [facility]   2 years ago Major depression, recurrent, chronic [facility])   [facility]izer, MD       Future Appointments             In 1 week Delsa Grana, PA-C [facility], [place]   In 1 month Furth, [place], PA-C [place], [facility]   In 9 months  [place]

## 2021-10-22 NOTE — Telephone Encounter (Signed)
Requested Prescriptions  Pending Prescriptions Disp Refills  . alendronate (FOSAMAX) 70 MG tablet 12 tablet 1    Sig: Take 1 tablet (70 mg total) by mouth once a week. With full glass of water and do not recline     Endocrinology:  Bisphosphonates Failed - 10/22/2021  3:15 PM      Failed - Vitamin D in normal range and within 360 days    Vit D, 25-Hydroxy  Date Value Ref Range Status  04/10/2020 71 30 - 100 ng/mL Final    Comment:    Vitamin D Status         25-OH Vitamin D: . Deficiency:                    <20 ng/mL Insufficiency:             20 - 29 ng/mL Optimal:                 > or = 30 ng/mL . For 25-OH Vitamin D testing on patients on  D2-supplementation and patients for whom quantitation  of D2 and D3 fractions is required, the QuestAssureD(TM) 25-OH VIT D, (D2,D3), LC/MS/MS is recommended: order  code [phone] (patients >49yr). See Note 1 . Note 1 . For additional information, please refer to  http://education.QuestDiagnostics.com/faq/FAQ199  (This link is being provided for informational/ educational purposes only.)          Failed - Cr in normal range and within 360 days    Creat  Date Value Ref Range Status  07/21/2021 1.05 (H) 0.60 - 1.00 mg/dL Final         Failed - Mg Level in normal range and within 360 days    Magnesium  Date Value Ref Range Status  03/04/2020 1.8 1.6 - 2.3 mg/dL Final         Failed - Phosphate in normal range and within 360 days    No results found for: PHOS       Failed - Bone Mineral Density or Dexa Scan completed in the last 2 years      Passed - Ca in normal range and within 360 days    Calcium  Date Value Ref Range Status  07/21/2021 9.8 8.6 - 10.4 mg/dL Final         Passed - eGFR is 30 or above and within 360 days    GFR, Est African American  Date Value Ref Range Status  04/10/2020 72 > OR = 60 mL/min/1.742mFinal   GFR, Est Non African American  Date Value Ref Range Status  04/10/2020 63 > OR = 60 mL/min/1.7324mFinal   eGFR  Date Value Ref Range Status  07/21/2021 55 (L) > OR = 60 mL/min/1.36m33mnal    Comment:    The eGFR is based on the CKD-EPI 2021 equation. To calculate  the new eGFR from a previous Creatinine or Cystatin C result, go to https://www.kidney.org/professionals/ kdoqi/gfr%5Fcalculator          Passed - Valid encounter within last 12 months    Recent Outpatient Visits          3 months ago Nonischemic [facility]   10 months ago Congestive heart failure with left ventricular systolic dysfunction ([facility]   1 year ago Senile purpura ([facility]   2 years  ago GERD without esophagitis   [facility] [place], Drue Stager, MD   2 years ago Major depression, recurrent, chronic [facility])   [facility] [place], Drue Stager, MD      Future Appointments            In 1 week Delsa Grana, PA-C [facility], [place]   In 1 month Furth, Cadence H, PA-C [place], [facility]   In 9 months  [facility], [facility]           . amLODipine (NORVASC) 10 MG tablet 90 tablet 3    Sig: Take 1 tablet (10 mg total) by mouth daily.     Cardiovascular: Calcium Channel Blockers 2 Failed - 10/22/2021  3:15 PM      Failed - Last BP in normal range    BP Readings from Last 1 Encounters:  07/21/21 (!) 152/88         Passed - Last Heart Rate in normal range    Pulse Readings from Last 1 Encounters:  07/21/21 81         Passed - Valid encounter within last 6 months    Recent Outpatient Visits          3 months ago Nonischemic cardiomyopathy [facility])   [facility] [place], Drue Stager, MD   10 months ago Congestive heart failure with left ventricular systolic dysfunction [facility])   [facility] Steele Sizer, MD   1 year  ago Senile purpura [facility])   [facility] Steele Sizer, MD   2 years ago GERD without esophagitis   [facility] Steele Sizer, MD   2 years ago Major depression, recurrent, chronic [facility])   [facility] Steele Sizer, MD      Future Appointments            In 1 week Delsa Grana, PA-C [facility], [place]   In 1 month Furth, Cadence H, PA-C [place], [facility]   In 9 months  [facility], [facility]           . atorvastatin (LIPITOR) 80 MG tablet 30 tablet 6    Sig: Take 1 tablet (80 mg total) by mouth daily.     Cardiovascular:  Antilipid - Statins Failed - 10/22/2021  3:15 PM      Failed - Lipid Panel in normal range within the last 12 months    Cholesterol, Total  Date Value Ref Range Status  04/29/2015 228 (H) 100 - 199 mg/dL Final   Cholesterol  Date Value Ref Range Status  07/21/2021 203 (H) <200 mg/dL Final   LDL Cholesterol (Calc)  Date Value Ref Range Status  07/21/2021 126 (H) mg/dL (calc) Final    Comment:    Reference range: <100 . Desirable range <100 mg/dL for primary prevention;   <70 mg/dL for patients with CHD or diabetic patients  with > or = 2 CHD risk factors. Marland Kitchen LDL-C is now calculated using the Martin-Hopkins  calculation, which is a validated novel method providing  better accuracy than the Friedewald equation in the  estimation of LDL-C.  Cresenciano Genre et al. Annamaria Helling. 4709;628(36): 2061-2068  (http://education.QuestDiagnostics.com/faq/FAQ164)    HDL  Date Value Ref Range Status  07/21/2021 57 > OR = 50 mg/dL Final  04/29/2015 46 >39 mg/dL Final   Triglycerides  Date Value Ref Range Status  07/21/2021 101 <150 mg/dL Final  Passed - Patient is not pregnant      Passed - Valid encounter within last 12 months    Recent Outpatient Visits          3 months ago Nonischemic cardiomyopathy [facility])   [facility]  [place], Drue Stager, MD   10 months ago Congestive heart failure with left ventricular systolic dysfunction [facility])   [facility] Steele Sizer, MD   1 year ago Senile purpura [facility])   [facility] Steele Sizer, MD   2 years ago GERD without esophagitis   [facility] Steele Sizer, MD   2 years ago Major depression, recurrent, chronic [facility])   [facility] Steele Sizer, MD      Future Appointments            In 1 week Delsa Grana, PA-C [facility], [place]   In 1 month Furth, Cadence H, PA-C [place], [facility]   In 9 months  [facility], [facility]           . chlorthalidone (HYGROTON) 25 MG tablet 90 tablet 3    Sig: Take 1 tablet (25 mg total) by mouth daily.     Cardiovascular: Diuretics - Thiazide Failed - 10/22/2021  3:15 PM      Failed - Cr in normal range and within 180 days    Creat  Date Value Ref Range Status  07/21/2021 1.05 (H) 0.60 - 1.00 mg/dL Final         Failed - Last BP in normal range    BP Readings from Last 1 Encounters:  07/21/21 (!) 152/88         Passed - K in normal range and within 180 days    Potassium  Date Value Ref Range Status  07/21/2021 4.2 3.5 - 5.3 mmol/L Final         Passed - Na in normal range and within 180 days    Sodium  Date Value Ref Range Status  07/21/2021 142 135 - 146 mmol/L Final  03/04/2020 141 134 - 144 mmol/L Final         Passed - Valid encounter within last 6 months    Recent Outpatient Visits          3 months ago Nonischemic cardiomyopathy [facility])   [facility] Steele Sizer, MD   10 months ago Congestive heart failure with left ventricular systolic dysfunction [facility])   [facility] Sizer, MD   1 year ago Senile purpura [facility])   [facility] Sizer, MD   2 years ago GERD without esophagitis   [facility] Sizer, MD   2 years ago Major depression, recurrent, chronic [facility])   [facility] Steele Sizer, MD      Future Appointments            In 1 week Delsa Grana, PA-C [facility], [place]   In 1 month Furth, Cadence H, PA-C [place], [facility]   In 9 months  [facility], [place]           . FLUoxetine (PROZAC) 10 MG capsule 90 capsule 0    Sig: Take 1 capsule (10 mg total) by mouth daily.     Psychiatry:  Antidepressants - SSRI Passed - 10/22/2021  3:15 PM      Passed - Completed PHQ-2 or  PHQ-9 in the last 360 days      Passed - Valid encounter within last 6 months    Recent Outpatient Visits          3 months ago Nonischemic cardiomyopathy [facility])   [facility] [place], Drue Stager, MD   10 months ago Congestive heart failure with left ventricular systolic dysfunction [facility])   [facility] Steele Sizer, MD   1 year ago Senile purpura [facility])   [facility] Steele Sizer, MD   2 years ago GERD without esophagitis   [facility] Steele Sizer, MD   2 years ago Major depression, recurrent, chronic [facility])   [facility] Steele Sizer, MD      Future Appointments            In 1 week Delsa Grana, PA-C [facility], [place]   In 1 month Furth, Cadence H, PA-C [facility], [facility]   In 9 months  [facility], [facility]           . furosemide (LASIX) 40 MG tablet 90 tablet 1    Sig: Take 1 tablet (40 mg total) by mouth as needed. Take at least once a week     Cardiovascular:  Diuretics - Loop Failed - 10/22/2021  3:15 PM      Failed - Cr in normal range and within 180 days    Creat  Date Value Ref Range Status  07/21/2021 1.05 (H) 0.60 - 1.00 mg/dL Final         Failed - Mg Level in normal range and within 180 days     Magnesium  Date Value Ref Range Status  03/04/2020 1.8 1.6 - 2.3 mg/dL Final         Failed - Last BP in normal range    BP Readings from Last 1 Encounters:  07/21/21 (!) 152/88         Passed - K in normal range and within 180 days    Potassium  Date Value Ref Range Status  07/21/2021 4.2 3.5 - 5.3 mmol/L Final         Passed - Ca in normal range and within 180 days    Calcium  Date Value Ref Range Status  07/21/2021 9.8 8.6 - 10.4 mg/dL Final         Passed - Na in normal range and within 180 days    Sodium  Date Value Ref Range Status  07/21/2021 142 135 - 146 mmol/L Final  03/04/2020 141 134 - 144 mmol/L Final         Passed - Cl in normal range and within 180 days    Chloride  Date Value Ref Range Status  07/21/2021 105 98 - 110 mmol/L Final         Passed - Valid encounter within last 6 months    Recent Outpatient Visits          3 months ago Nonischemic cardiomyopathy [facility])   [facility] [place], Drue Stager, MD   10 months ago Congestive heart failure with left ventricular systolic dysfunction [facility])   [facility] Sizer, MD   1 year ago Senile purpura [facility])   [facility] Steele Sizer, MD   2 years ago GERD without esophagitis   [facility] [place], Drue Stager, MD   2 years ago Major depression, recurrent, chronic ([place])   [place]  [facility] Steele Sizer, MD      Future Appointments            In 1 week Delsa Grana, PA-C [facility], [facility]   In 1 month Furth, Cadence H, PA-C [place], [facility]   In 9 months  [facility], [facility]           . levothyroxine (SYNTHROID) 50 MCG tablet 180 tablet 0    Sig: Take 1-2 tablets (50-100 mcg total) by mouth daily. Recheck in 6 weeks     Endocrinology:  Hypothyroid Agents Failed - 10/22/2021  3:15 PM      Failed - TSH in normal range and within 360 days     TSH  Date Value Ref Range Status  07/21/2021 10.68 (H) 0.40 - 4.50 mIU/L Final         Passed - Valid encounter within last 12 months    Recent Outpatient Visits          3 months ago Nonischemic cardiomyopathy [facility])   [facility], Drue Stager, MD   10 months ago Congestive heart failure with left ventricular systolic dysfunction [facility])   [facility] Steele Sizer, MD   1 year ago Senile purpura [facility])   [facility] Sizer, MD   2 years ago GERD without esophagitis   [facility] Sizer, MD   2 years ago Major depression, recurrent, chronic [facility])   [facility] Steele Sizer, MD      Future Appointments            In 1 week Delsa Grana, PA-C [facility], [place]   In 1 month Furth, Cadence H, PA-C [place], [facility]   In 9 months  [facility], [facility]           . lisinopril (ZESTRIL) 20 MG tablet 90 tablet 3    Sig: TAKE 1 TABLET (20 MG TOTAL) BY MOUTH DAILY.     Cardiovascular:  ACE Inhibitors Failed - 10/22/2021  3:15 PM      Failed - Cr in normal range and within 180 days    Creat  Date Value Ref Range Status  07/21/2021 1.05 (H) 0.60 - 1.00 mg/dL Final         Failed - Last BP in normal range    BP Readings from Last 1 Encounters:  07/21/21 (!) 152/88         Passed - K in normal range and within 180 days    Potassium  Date Value Ref Range Status  07/21/2021 4.2 3.5 - 5.3 mmol/L Final         Passed - Patient is not pregnant      Passed - Valid encounter within last 6 months    Recent Outpatient Visits          3 months ago Nonischemic cardiomyopathy [facility])   [facility] [place], Drue Stager, MD   10 months ago Congestive heart failure with left ventricular systolic dysfunction [facility])   [facility] Steele Sizer, MD   1 year ago Senile purpura  [facility])   [facility] Sizer, MD   2 years ago GERD without esophagitis   [facility] Sizer, MD   2 years ago Major depression, recurrent, chronic [facility])   [facility] Steele Sizer, MD  Future Appointments            In 1 week Delsa Grana, PA-C [facility], [place]   In 1 month Furth, Cadence H, PA-C [place], [facility]   In 9 months  [place]

## 2021-10-23 ENCOUNTER — Other Ambulatory Visit: Payer: Self-pay

## 2021-10-23 ENCOUNTER — Other Ambulatory Visit: Payer: Self-pay | Admitting: Family Medicine

## 2021-10-23 DIAGNOSIS — M8000XA Age-related osteoporosis with current pathological fracture, unspecified site, initial encounter for fracture: Secondary | ICD-10-CM

## 2021-10-23 MED ORDER — ALENDRONATE SODIUM 70 MG PO TABS: 70.0000 mg | ORAL_TABLET | ORAL | 1 refills | Status: DC

## 2021-10-30 ENCOUNTER — Ambulatory Visit: Payer: Medicare Other | Admitting: Family Medicine

## 2021-11-03 ENCOUNTER — Encounter: Payer: Medicare Other | Admitting: Internal Medicine

## 2021-11-22 NOTE — Progress Notes (Deleted)
Cardiology Office Note:    Date:  11/22/2021   ID:  Felicia Poole, DOB 16-Oct-1945, MRN [id number]  PCP:  Felicia Sizer, MD  [facility] Cardiologist:  None  [facility] Electrophysiologist:  None   Referring MD: Felicia Sizer, MD   Chief Complaint: 6 month follow-up  History of Present Illness:    Felicia Poole is a 76 y.o. female with a hx of CAD s/p 1V bypass, atrial myxoma s/p resection 2012, NICM with an EF 45-50%, paroxysmal Afib on warfaroi and sotalol, tachybrady syndrome s/p pacemaker, HLD, HTN, DM2, who present for follow-up.   She last underwent ischemic evaluation in August 2014 with nonischemic stress testing performed at [facility].  Echocardiogram performed at that time showed an EF of 45 to 50% with mild diffuse hypokinesis, mild to moderate mitral regurgitation, and moderate tricuspid regurgitation.  In the setting of tachybradycardia syndrome, she underwent permanent pacemaker placement at [facility] in September 2014.  She has been followed closely here in our anticoagulation clinic and follows with Dr. Caryl Poole.   Last seen 10/2020 and had mild volume overload and an extra lasix dose was recommended.   Today,  Past Medical History:  Diagnosis Date   Atrial myxoma    a. 2012 s/p resection at [facility].   Benign neoplasm of heart    Contact dermatitis and other eczema, due to unspecified cause    Contusion of unspecified site    Coronary atherosclerosis of unspecified type of vessel, native or graft    a. 08/2010 s/p CABG x 1 (LIMA->LAD @ time of atrial myxoma resection ([facility]); b. 01/2013 MV: No ischemia/infarct. EF 71%.   Cramp of limb    Depression    Essential hypertension, benign    Heartburn    Hyperlipidemia LDL goal <70    Lumbago    Nonischemic cardiomyopathy ([place])    a. EF: 45% in 2012; b. 01/2013 Echo: EF 45-50%, mild diff HK. Mild to mod MR. Mild BAE. Mod TR.   Osteoporosis, unspecified    Other specified cardiac dysrhythmias(427.89)    PAF  (paroxysmal atrial fibrillation) (HCC)    a. CHA2DS2VASc = 6-->warfarin.  Rhythm management with sotalol.   Postsurgical percutaneous transluminal coronary angioplasty status    Tachy-brady syndrome ([place])    a. 02/2013 s/p MDT [id number] Advisa DR MRI DC PPM.   Type II or unspecified type diabetes mellitus without mention of complication, not stated as uncontrolled    Unspecified hypothyroidism    Unspecified menopausal and postmenopausal disorder     Past Surgical History:  Procedure Laterality Date   ATRIAL MYXOMA EXCISION Left    CARDIAC CATHETERIZATION  2012   [facility]; right and left heart 75% ostial LAD lesion   CORONARY ARTERY BYPASS GRAFT  2011   [facility]   INSERT / REPLACE / REMOVE PACEMAKER  02/2013   Dual chamber MDT advisa pacemaker. [id number]   VAGINAL HYSTERECTOMY      Current Medications: No outpatient medications have been marked as taking for the 11/23/21 encounter (Appointment) with Felicia Poole, Lil Lepage H, PA-C.     Allergies:   Patient has no known allergies.   Social History   Socioeconomic History   Marital status: Widowed    Spouse name: Felicia Poole   Number of children: 5   Years of education: Not on file   Highest education level: 9th grade  Occupational History    Employer: [employer]ole  Tobacco Use   Smoking status: Never   Smokeless tobacco: Never   Tobacco comments:  smoking cessation materials not required  Vaping Use   Vaping Use: Never used  Substance and Sexual Activity   Alcohol use: No   Drug use: No   Sexual activity: Never  Other Topics Concern   Not on file  Social History Narrative   Pt lives with son, daughter Felicia Poole, son in Sports coach, granddaughter   Social Determinants of Health   Financial Resource Strain: Low Risk  (07/28/2021)   Overall Financial Resource Strain (CARDIA)    Difficulty of Paying Living Expenses: Not hard at all  Food Insecurity: No Food Insecurity (07/28/2021)   Hunger Vital Sign    Worried About Running Out of Food in the Last Year:  Never true    Felicia Poole in the Last Year: Never true  Transportation Needs: No Transportation Needs (07/28/2021)   PRAPARE - Hydrologist (Medical): No    Lack of Transportation (Non-Medical): No  Physical Activity: Inactive (07/28/2021)   Exercise Vital Sign    Days of Exercise per Week: 0 days    Minutes of Exercise per Session: 0 min  Stress: No Stress Concern Present (07/28/2021)   [place]    Feeling of Stress : Only a little  Social Connections: Socially Isolated (07/28/2021)   Social Connection and Isolation Panel [NHANES]    Frequency of Communication with Friends and Family: More than three times a week    Frequency of Social Gatherings with Friends and Family: More than three times a week    Attends Religious Services: Never    Marine scientist or Organizations: No    Attends Archivist Meetings: Never    Marital Status: Widowed     Family History: The patient's ***family history includes Cirrhosis in her mother; Heart attack in her father; Heart failure in her mother; Hyperlipidemia in her father; Hypertension in her father; Stroke in her father.  ROS:   Please see the history of present illness.    *** All other systems reviewed and are negative.  EKGs/Labs/Other Studies Reviewed:    The following studies were reviewed today: ***  EKG:  EKG is *** ordered today.  The ekg ordered today demonstrates ***  Recent Labs: 07/21/2021: ALT 21; BUN 14; Creat 1.05; Hemoglobin 14.5; Platelets 152; Potassium 4.2; Sodium 142; TSH 10.68  Recent Lipid Panel    Component Value Date/Time   CHOL 203 (H) 07/21/2021 1207   CHOL 228 (H) 04/29/2015 1210   TRIG 101 07/21/2021 1207   HDL 57 07/21/2021 1207   HDL 46 04/29/2015 1210   CHOLHDL 3.6 07/21/2021 1207   LDLCALC 126 (H) 07/21/2021 1207     Risk Assessment/Calculations:   {Does this patient have ATRIAL  FIBRILLATION?[phone]}   Physical Exam:    VS:  There were no vitals taken for this visit.    Wt Readings from Last 3 Encounters:  07/21/21 127 lb 1.6 oz (57.7 kg)  12/22/20 134 lb (60.8 kg)  10/07/20 136 lb (61.7 kg)     GEN: *** Well nourished, well developed in no acute distress HEENT: Normal NECK: No JVD; No carotid bruits LYMPHATICS: No lymphadenopathy CARDIAC: ***RRR, no murmurs, rubs, gallops RESPIRATORY:  Clear to auscultation without rales, wheezing or rhonchi  ABDOMEN: Soft, non-tender, non-distended MUSCULOSKELETAL:  No edema; No deformity  SKIN: Warm and dry NEUROLOGIC:  Alert and oriented x 3 PSYCHIATRIC:  Normal affect   ASSESSMENT:    No diagnosis found.  PLAN:    In order of problems listed above:  HFpEF  CAD s/p remote CABG  HTN  HLD  Afib  Atrial Myxoma   Sinus node dysfunction s/p PPM  Disposition: Follow up {follow up:15908} with ***   Shared Decision Making/Informed Consent   {Are you ordering a CV Procedure (e.g. stress test, cath, DCCV, TEE, etc)?   Press F2        :[id number]}    Signed, Faiz Weber Arlyss Repress  11/22/2021 9:50 PM    [facility]

## 2021-11-23 ENCOUNTER — Ambulatory Visit: Payer: Medicare Other | Admitting: Medical

## 2021-11-26 ENCOUNTER — Encounter: Payer: Self-pay | Admitting: Nurse Practitioner

## 2021-11-26 ENCOUNTER — Telehealth: Payer: Self-pay | Admitting: Nurse Practitioner

## 2021-11-26 ENCOUNTER — Ambulatory Visit: Payer: Medicare Other | Admitting: Nurse Practitioner

## 2021-11-26 NOTE — Telephone Encounter (Signed)
The patient was scheduled to be seen in the office today with Ignacia Bayley, NP. She has no showed her last few appointment with Dr. Caryl Comes and today with Gerald Stabs, NP. Per Gerald Stabs, the patient must be seen in order to continue to manage her sotalol dose-- last echo showed a low EF, it may not be appropriate for her to on sotalol at this time. She has not had her device checked in some time.  The patient has been coming for her INR checks. I have scheduled her to follow up with Ignacia Bayley, NP on 12/02/21 at 1055, following her already scheduled INR check at 1015 that same day.   I attempted to call the patient's daughter, [name] to advise her of this appointment change. No answer- I left a detailed message on Linda's identified voice mail, that the patient MUST be seen in the office in order to continue to refill her sotalol, but there are also concerns that need to be addressed as well.  I asked that she call me back with any further questions/ concerns prior to the appointment.   Staff message sent to [facility] to notify them of the above and to not let the patient leave the office prior to seeing the provider.

## 2021-11-26 NOTE — Progress Notes (Deleted)
Cardiology Clinic Note   Patient Name: Felicia Poole Date of Encounter: 11/26/2021  Primary Care Provider:  Steele Sizer, MD Primary Cardiologist:  Virl Axe, MD  Patient Profile    76 year old female with a history of CAD status post 1 vessel bypass, atrial myxoma status postresection (2012), nonischemic cardiomyopathy with an EF of 35-40% (2021), paroxysmal atrial fibrillation on warfarin and sotalol, tachybradycardia syndrome status post pacemaker placement, hyperlipidemia, essential hypertension, and type 2 diabetes who presents for follow-up of her coronary artery disease.  Past Medical History    Past Medical History:  Diagnosis Date   Atrial myxoma    a. 2012 s/p resection at [facility].   Benign neoplasm of heart    Chronic HFrEF (heart failure with reduced ejection fraction) ([place])    a. EF: 45% in 2012; b. 01/2013 Echo: EF 45-50%;  c. 09/2019 Echo: EF 35-40%.   Contact dermatitis and other eczema, due to unspecified cause    Contusion of unspecified site    Coronary atherosclerosis of unspecified type of vessel, native or graft    a. 08/2010 s/p CABG x 1 (LIMA->LAD @ time of atrial myxoma resection ([facility]); b. 01/2013 MV: No ischemia/infarct. EF 71%.   Cramp of limb    Depression    Essential hypertension, benign    Heartburn    Hyperlipidemia LDL goal <70    Lumbago    Nonischemic cardiomyopathy ([place])    a. EF: 45% in 2012; b. 01/2013 Echo: EF 45-50%, mild diff HK. Mild to mod MR. Mild BAE. Mod TR; c. 09/2019 Echo: EF 35-40%, glob HK, mod enlarged RV w/ nl fxn, RVSP 18mHg, mild-mod LAE, mild-mod MR, sev TR.   Osteoporosis, unspecified    Other specified cardiac dysrhythmias(427.89)    PAF (paroxysmal atrial fibrillation) (HCC)    a. CHA2DS2VASc = 6-->warfarin.  Rhythm management with sotalol.   Postsurgical percutaneous transluminal coronary angioplasty status    T[place]brady [place]    a. 02/2013 s/p MDT [id number] DR MRI DC PPM.   Type II or unspecified type  diabetes mellitus without mention of complication, not stated as uncontrolled    Unspecified hypothyroidism    Unspecified menopausal and postmenopausal disorder    Past Surgical History:  Procedure Laterality Date   ATRIAL MYXOMA EXCISION Left    CARDIAC CATHETERIZATION  2012   [facility]; right and left heart 75% ostial LAD lesion   CORONARY ARTERY BYPASS GRAFT  2011   [facility]   INSERT / REPLACE / REMOVE PACEMAKER  02/2013   Dual chamber MDT advisa pacemaker. [id number]   VAGINAL HYSTERECTOMY      Allergies  No Known Allergies  History of Present Illness    76year old female with a history of CAD status post one-vessel bypass at the time [facility] nonischemic cardiomyopathy with an EF of 35 to 40%, paroxysmal atrial fibrillation on chronic anticoagulant therapy with warfarin and sotalol, tachybradycardia syndrome status post permanent pacemaker placement, essential hypertension, hyperlipidemia, and type 2 diabetes.  She last underwent ischemic evaluation August 2014 [facility]  Last echocardiogram completed 09/07/2019 revealed left ventricular ejection fraction decreased to 35 to 40%, left ventricle with moderately decreased function, left ventricle demonstrates global hypokinesis, left ventricular diastolic parameters are indeterminate, right ventricular systolic function is low normal, right ventricular size is moderately enlarged, moderately elevated pulmonary artery systolic pressure with estimated right ventricular systolic pressure of 50 mmHg, left atrial size is mild to moderately  dilated, mild to moderate mitral valve regurgitation, tricuspid valve regurgitation is severe, the inferior vena cava is dilated in size with <50% respiratory variability suggesting right atrial pressures of 15 mmHg.  She was followed by Dr. Caryl Comes in 02/2020 with an office visit as well as a device interrogation that was completed.  She then  followed in clinic with Dr. Rockey Situ 10/2020 with concerns of lower extremity edema and cramping in her legs.  Weight was up and had associated shortness of breath, had not been taking diuretic therapy and at the end of visit was advised to take Lasix for 2 days in a row and then back to once a week.  Home Medications    Current Outpatient Medications  Medication Sig Dispense Refill   alendronate (FOSAMAX) 70 MG tablet Take 1 tablet (70 mg total) by mouth once a week. With full glass of water and do not recline 12 tablet 1   amLODipine (NORVASC) 10 MG tablet Take 1 tablet (10 mg total) by mouth daily. 90 tablet 3   atorvastatin (LIPITOR) 80 MG tablet TAKE 1 TABLET BY MOUTH EVERY DAY 30 tablet 6   chlorthalidone (HYGROTON) 25 MG tablet Take 1 tablet (25 mg total) by mouth daily. 90 tablet 3   FLUoxetine (PROZAC) 10 MG capsule Take 1 capsule (10 mg total) by mouth daily. 90 capsule 0   fluticasone (FLONASE) 50 MCG/ACT nasal spray SPRAY 2 SPRAYS IN EACH NOSTRIL AT BEDTIME 48 mL 1   furosemide (LASIX) 40 MG tablet Take 1 tablet (40 mg total) by mouth as needed. Take at least once a week 90 tablet 1   Incontinence Supply Disposable ([employer] BLADDER CONTROL PAD) MISC 1 each by Does not apply route daily. 100 each 1   levothyroxine (SYNTHROID) 50 MCG tablet Take 1-2 tablets (50-100 mcg total) by mouth daily. Recheck in 6 weeks 180 tablet 0   lisinopril (PRINIVIL,ZESTRIL) 20 MG tablet TAKE 1 TABLET (20 MG TOTAL) BY MOUTH DAILY. 90 tablet 3   OS-CAL CALCIUM + D3 500-200 MG-UNIT TABS TAKE 1 TABLET BY MOUTH EVERY DAY (Patient not taking: Reported on 07/28/2021) 90 tablet 0   sotalol (BETAPACE) 80 MG tablet Take 1 tablet (80 mg total) by mouth 2 (two) times daily. MUST BE SEEN IN OFFICE FOR FURTHER REFILLS 60 tablet 0   Vitamin D, Ergocalciferol, (DRISDOL) 1.25 MG (50000 UNIT) CAPS capsule TAKE 1 CAPSULE (50,000 UNITS TOTAL) BY MOUTH EVERY 7 (SEVEN) DAYS. (Patient not taking: Reported on 07/28/2021) 12 capsule 3    warfarin (COUMADIN) 2.5 MG tablet Take 1-1.5 tablets daily as directed by the anti-coag clinic 150 tablet 0   No current facility-administered medications for this visit.     Family History    Family History  Problem Relation Age of Onset   Heart failure Mother    Cirrhosis Mother    Hyperlipidemia Father    Hypertension Father    Stroke Father    Heart attack Father    She indicated that her mother is deceased. She indicated that her father is deceased. She indicated that both of her brothers are alive.  Social History    Social History   Socioeconomic History   Marital status: Widowed    Spouse name: Gwenlyn Perking   Number of children: 5   Years of education: Not on file   Highest education level: 9th grade  Occupational History    Employer: DISABLED  Tobacco Use   Smoking status: Never   Smokeless tobacco: Never  Tobacco comments:    smoking cessation materials not required  Vaping Use   Vaping Use: Never used  Substance and Sexual Activity   Alcohol use: No   Drug use: No   Sexual activity: Never  Other Topics Concern   Not on file  Social History Narrative   Pt lives with son, daughter [name], son in Sports coach, granddaughter   Social Determinants of Health   Financial Resource Strain: Low Risk  (07/28/2021)   Overall Financial Resource Strain (CARDIA)    Difficulty of Paying Living Expenses: Not hard at all  Food Insecurity: No Food Insecurity (07/28/2021)   Hunger Vital Sign    Worried About Running Out of Food in the Last Year: Never true    [place] in the Last Year: Never true  Transportation Needs: No Transportation Needs (07/28/2021)   PRAPARE - Hydrologist (Medical): No    Lack of Transportation (Non-Medical): No  Physical Activity: Inactive (07/28/2021)   Exercise Vital Sign    Days of Exercise per Week: 0 days    Minutes of Exercise per Session: 0 min  Stress: No Stress Concern Present (07/28/2021)   [place]    Feeling of Stress : Only a little  Social Connections: Socially Isolated (07/28/2021)   Social Connection and Isolation Panel [NHANES]    Frequency of Communication with Friends and Family: More than three times a week    Frequency of Social Gatherings with Friends and Family: More than three times a week    Attends Religious Services: Never    Marine scientist or Organizations: No    Attends Archivist Meetings: Never    Marital Status: Widowed  Intimate Partner Violence: Not At Risk (07/28/2021)   Humiliation, Afraid, Rape, and Kick questionnaire    Fear of Current or Ex-Partner: No    Emotionally Abused: No    Physically Abused: No    Sexually Abused: No     Review of Systems    General:  No chills, fever, night sweats or weight changes.  Cardiovascular:  No chest pain, dyspnea on exertion, edema, orthopnea, palpitations, paroxysmal nocturnal dyspnea. Dermatological: No rash, lesions/masses Respiratory: No cough, dyspnea Urologic: No hematuria, dysuria Abdominal:   No nausea, vomiting, diarrhea, bright red blood per rectum, melena, or hematemesis Neurologic:  No visual changes, wkns, changes in mental status. All other systems reviewed and are otherwise negative except as noted above.     Physical Exam    VS:  There were no vitals taken for this visit. , BMI There is no height or weight on file to calculate BMI.     GEN: Well nourished, well developed, in no acute distress. HEENT: normal. Neck: Supple, no JVD, carotid bruits, or masses. Cardiac: RRR, no murmurs, rubs, or gallops. No clubbing, cyanosis, edema.  Radials/DP/PT 2+ and equal bilaterally.  Respiratory:  Respirations regular and unlabored, clear to auscultation bilaterally. GI: Soft, nontender, nondistended, BS + x 4. MS: no deformity or atrophy. Skin: warm and dry, no rash. Neuro:  Strength and sensation are intact. Psych: Normal  affect.  Accessory Clinical Findings    ECG personally reviewed by me today- *** - No acute changes  Lab Results  Component Value Date   WBC 3.8 07/21/2021   HGB 14.5 07/21/2021   HCT 44.0 07/21/2021   MCV 98.4 07/21/2021   PLT 152 07/21/2021   Lab Results  Component  Value Date   CREATININE 1.05 (H) 07/21/2021   BUN 14 07/21/2021   NA 142 07/21/2021   K 4.2 07/21/2021   CL 105 07/21/2021   CO2 25 07/21/2021   Lab Results  Component Value Date   ALT 21 07/21/2021   AST 25 07/21/2021   ALKPHOS 96 10/26/2016   BILITOT 0.7 07/21/2021   Lab Results  Component Value Date   CHOL 203 (H) 07/21/2021   HDL 57 07/21/2021   LDLCALC 126 (H) 07/21/2021   TRIG 101 07/21/2021   CHOLHDL 3.6 07/21/2021    Lab Results  Component Value Date   HGBA1C 5.9 (H) 12/22/2020    Assessment & Plan   1.  ***  Christalyn Goertz, NP 11/26/2021, 9:06 AM

## 2021-11-30 NOTE — Progress Notes (Unsigned)
Office Visit    Patient Name: Felicia Poole Date of Encounter: 12/02/2021  Primary Care Provider:  Deboraha Sprang, MD Primary Cardiologist:  None Primary Electrophysiologist: Dr. Virl Poole  Chief Complaint    Felicia Poole is a 76 y.o. female with PMH of CAD s/p CABG x1, atrial myxoma, s/p resection in 2012, NICM with EF of 35-40%, PAF on warfarin, S/P PPM 02/2013 for tachybradycardia syndrome, HTN, HLD, DM type II who presents today for follow-up of CAD.  Past Medical History    Past Medical History:  Diagnosis Date   Atrial myxoma    a. 2012 s/p resection at [facility].   Benign neoplasm of heart    Chronic HFrEF (heart failure with reduced ejection fraction) ([place])    a. EF: 45% in 2012; b. 01/2013 Echo: EF 45-50%;  c. 09/2019 Echo: EF 35-40%.   Contact dermatitis and other eczema, due to unspecified cause    Contusion of unspecified site    Coronary atherosclerosis of unspecified type of vessel, native or graft    a. 08/2010 s/p CABG x 1 (LIMA->LAD @ time of atrial myxoma resection ([facility]); b. 01/2013 MV: No ischemia/infarct. EF 71%.   Cramp of limb    Depression    Essential hypertension, benign    Heartburn    Hyperlipidemia LDL goal <70    Lumbago    Nonischemic cardiomyopathy ([place])    a. EF: 45% in 2012; b. 01/2013 Echo: EF 45-50%, mild diff HK. Mild to mod MR. Mild BAE. Mod TR; c. 09/2019 Echo: EF 35-40%, glob HK, mod enlarged RV w/ nl fxn, RVSP 33mHg, mild-mod LAE, mild-mod MR, sev TR.   Osteoporosis, unspecified    Other specified cardiac dysrhythmias(427.89)    PAF (paroxysmal atrial fibrillation) (HCC)    a. CHA2DS2VASc = 6-->warfarin.  Rhythm management with sotalol.   Postsurgical percutaneous transluminal coronary angioplasty status    Tachy-brady syndrome ([place]    a. 02/2013 s/p MDT [id number] DR MRI DC PPM.   Type II or unspecified type diabetes mellitus without mention of complication, not stated as uncontrolled    Unspecified hypothyroidism    Unspecified  menopausal and postmenopausal disorder    Past Surgical History:  Procedure Laterality Date   ATRIAL MYXOMA EXCISION Left    CARDIAC CATHETERIZATION  2012   [facility]; right and left heart 75% ostial LAD lesion   CORONARY ARTERY BYPASS GRAFT  2011   [facility]   INSERT / REPLACE / REMOVE PACEMAKER  02/2013   Dual chamber MDT advisa pacemaker. [id number]   VAGINAL HYSTERECTOMY      Allergies  No Known Allergies  History of Present Illness    MEmiliya Chretienis a 76year old female with the above-mentioned past medical history presents today for follow-up of coronary artery disease.  Ms. MSchwanzhad [facility] She underwent a nuclear stress test in March of 2014 by Dr. CClayborn Poole her previous cardiologist which showed no evidence of ischemia with normal ejection fraction. She underwent evaluation with an outpatient telemetry which showed frequent episodes of atrial fibrillation with rapid ventricular response up to 160 beats per minute followed by prolonged 3-6 seconds pauses with bradycardia. She had Medtronic PPM [facility] that is currently being followed by Dr. KCaryl Poole She is on Sotalol for rhythm control.  She is on warfarin for atrial fibrillation and most recent 2D echo was completed 09/2019 with EF  of 35-40% with mildly decreased LV function and global hypokinesis. She was seen by Felicia Poole on 10/2020 for complaint of lower extremity edema and cramping in her legs.  She was reportedly not taking her Lasix and was restarted as needed.  She has previously missed multiple appointments with Coumadin clinic and EP.  She is also not completed pacemaker transmissions and during last appointment in 2021 her battery was estimated to be at 2 years remaining.  Since last being seen in our clinic back in 2021 she reports that she has been feeling well with no new cardiac complaints.  She is accompanied today by her daughter at  this visit and states that she has been compliant with all of her medications.  She is euvolemic on examination with some trace edema in her left extremity.  We discussed today at length the importance of follow-up especially with her multiple comorbidities and medications such as warfarin, sotalol, and PPM device management..  She does endorse some shortness of breath with heavy exertion that resolves quickly with rest.  Patient denies chest pain, palpitations, PND, orthopnea, nausea, vomiting, dizziness, syncope, edema, weight gain, or early satiety.    Home Medications    Current Outpatient Medications  Medication Sig Dispense Refill   alendronate (FOSAMAX) 70 MG tablet Take 1 tablet (70 mg total) by mouth once a week. With full glass of water and do not recline 12 tablet 1   amLODipine (NORVASC) 10 MG tablet Take 1 tablet (10 mg total) by mouth daily. 90 tablet 3   atorvastatin (LIPITOR) 80 MG tablet TAKE 1 TABLET BY MOUTH EVERY DAY 30 tablet 6   chlorthalidone (HYGROTON) 25 MG tablet Take 1 tablet (25 mg total) by mouth daily. 90 tablet 3   FLUoxetine (PROZAC) 10 MG capsule Take 1 capsule (10 mg total) by mouth daily. 90 capsule 0   fluticasone (FLONASE) 50 MCG/ACT nasal spray SPRAY 2 SPRAYS IN EACH NOSTRIL AT BEDTIME 48 mL 1   furosemide (LASIX) 40 MG tablet Take 1 tablet (40 mg total) by mouth as needed. Take at least once a week 90 tablet 1   Incontinence Supply Disposable ([employer] BLADDER CONTROL PAD) MISC 1 each by Does not apply route daily. 100 each 1   levothyroxine (SYNTHROID) 50 MCG tablet Take 1-2 tablets (50-100 mcg total) by mouth daily. Recheck in 6 weeks 180 tablet 0   lisinopril (PRINIVIL,ZESTRIL) 20 MG tablet TAKE 1 TABLET (20 MG TOTAL) BY MOUTH DAILY. 90 tablet 3   OS-CAL CALCIUM + D3 500-200 MG-UNIT TABS TAKE 1 TABLET BY MOUTH EVERY DAY 90 tablet 0   sotalol (BETAPACE) 80 MG tablet Take 1 tablet (80 mg total) by mouth 2 (two) times daily. MUST BE SEEN IN OFFICE FOR FURTHER  REFILLS 60 tablet 0   Vitamin D, Ergocalciferol, (DRISDOL) 1.25 MG (50000 UNIT) CAPS capsule TAKE 1 CAPSULE (50,000 UNITS TOTAL) BY MOUTH EVERY 7 (SEVEN) DAYS. 12 capsule 3   warfarin (COUMADIN) 2.5 MG tablet Take 1-1.5 tablets daily as directed by the anti-coag clinic 150 tablet 0   No current facility-administered medications for this visit.     Review of Systems  Please see the history of present illness.    (+) Arthritis pain in hands (+) Shortness of breath with exertion  All other systems reviewed and are otherwise negative except as noted above.  Physical Exam    Wt Readings from Last 3 Encounters:  12/02/21 127 lb (57.6 kg)  07/21/21 127 lb 1.6 oz (57.7  kg)  12/22/20 134 lb (60.8 kg)   VS: Vitals:   12/02/21 1024  BP: (!) 150/72  Pulse: 73  SpO2: 98%  ,Body mass index is 27.48 kg/m.  Constitutional:      Appearance: Healthy appearance. Not in distress.  Neck:     Vascular: JVD normal.  Pulmonary:     Effort: Pulmonary effort is normal.     Breath sounds: No wheezing. No rales. Diminished in the bases Cardiovascular:     Normal rate.  Atrial paced with prolonged AV conduction and rate of 73, normal S1. Normal S2.      Murmurs: There is no murmur.  Edema:    Trace peripheral edema present in left lower extremity.  Abdominal:     Palpations: Abdomen is soft non tender. There is no hepatomegaly.  Skin:    General: Skin is warm and dry.  Neurological:     General: No focal deficit present.     Mental Status: Alert and oriented to person, place and time.     Cranial Nerves: Cranial nerves are intact.  EKG/LABS/Other Studies Reviewed    ECG personally reviewed by me today -atrial paced rhythm with prolonged AV conduction and nonspecific T wave abnormalities with rate of 73 and no acute changes  Risk Assessment/Calculations:    [id number] Score = 7  {Click here to calculate score.  REFRESH note before signing. :1} This indicates a 11.2% annual risk of  stroke. The patient's score is based upon: CHF History: 1 HTN History: 1 Diabetes History: 1 Stroke History: 0 Vascular Disease History: 1 Age Score: 2 Gender Score: 1   {This patient has a significant risk of stroke if diagnosed with atrial fibrillation.  Please consider VKA or DOAC agent for anticoagulation if the bleeding risk is acceptable.   You can also use the SmartPhrase .[place] for documentation.   :[id number]}  Lab Results  Component Value Date   WBC 3.8 07/21/2021   HGB 14.5 07/21/2021   HCT 44.0 07/21/2021   MCV 98.4 07/21/2021   PLT 152 07/21/2021   Lab Results  Component Value Date   CREATININE 1.05 (H) 07/21/2021   BUN 14 07/21/2021   NA 142 07/21/2021   K 4.2 07/21/2021   CL 105 07/21/2021   CO2 25 07/21/2021   Lab Results  Component Value Date   ALT 21 07/21/2021   AST 25 07/21/2021   ALKPHOS 96 10/26/2016   BILITOT 0.7 07/21/2021   Lab Results  Component Value Date   CHOL 203 (H) 07/21/2021   HDL 57 07/21/2021   LDLCALC 126 (H) 07/21/2021   TRIG 101 07/21/2021   CHOLHDL 3.6 07/21/2021    Lab Results  Component Value Date   HGBA1C 5.9 (H) 12/22/2020    Assessment & Plan    1.  Coronary artery disease: -s/p CABG x1 in 2014 at [facility].  Patient denies any anginal complaints today and states that she has been doing well since her last visit. -Continue Lipitor 80 mg daily, patient currently not on aspirin therapy and lieu of warfarin. -No further work-up warranted at this time  2.  Paroxysmal atrial fibrillation: -Patient is atrial paced today indicating sinus rhythm. -Continue warfarin as directed by Coumadin clinic -Continue sotalol 80 mg daily -BMET and Mg today -2D echo was completed 09/2019 with EF of 35-40% with mildly decreased LV function -Repeat 2D echo to evaluate current EF and LV function CHA2DS2-VASc Score = 7 [CHF History: 1, HTN History: 1, Diabetes History:  1, Stroke History: 0, Vascular Disease History: 1, Age  Score: 2, Gender Score: 1].  Therefore, the patient's annual risk of stroke is 11.2 %.      3.  Nonischemic cardiomyopathy: -Patient is euvolemic on examination today and blood pressure today was 150/72. -We discussed the importance of maintaining low blood pressure to prevent progression of cardiomyopathy -Continue blood pressure and Lasix 40 mg daily  4.  Tachybradycardia syndrome: -s/p Medtronic PPM placed 2014 at [place] nearing ERI and attempt was made to upload data to [employer] but unable to process due to inadequate cell signal.  We have forwarded message to the device clinic to mail patient a new transmitter for home. -Patient instructed to keep upcoming appointment with Dr. Caryl Poole next month to discuss pacemaker management and sotalol.  5.  Essential hypertension: -Blood pressure today was 150/72 slightly above goal of 130/80, patient advised to abstain from excess sodium in her diet and to check blood pressures daily -Continue Norvasc 10 mg daily, chlorthalidone 25 mg daily, and Zestril 20 mg daily  6.  Hypercholesteremia: -Last LDL cholesterol was 126 on 07/2021 above goal of less than 70 -The importance of compliance and diet and managing and preventing progression of coronary artery disease Disposition: Follow-up with Dr. Virl Poole or APP in 4 Weeks    Medication Adjustments/Labs and Tests Ordered: Current medicines are reviewed at length with the patient today.  Concerns regarding medicines are outlined above.   Signed, Mable Fill, Marissa Nestle, NP 12/02/2021, 12:03 PM [place]

## 2021-12-01 ENCOUNTER — Ambulatory Visit: Payer: Self-pay

## 2021-12-01 DIAGNOSIS — I5020 Unspecified systolic (congestive) heart failure: Secondary | ICD-10-CM

## 2021-12-01 DIAGNOSIS — E7800 Pure hypercholesterolemia, unspecified: Secondary | ICD-10-CM

## 2021-12-01 NOTE — Chronic Care Management (AMB) (Signed)
Chronic Care Management   CCM RN Visit Note  12/01/2021 Name: Felicia Poole MRN: [id number] DOB: 1946/03/25  Subjective: Felicia Poole is a 76 y.o. year old female who is a primary care patient of Steele Sizer, MD. The care management team was consulted for assistance with disease management and care coordination needs.    Engaged with patient's daughter/caregiver Felicia Poole by telephone.  Assessment: Review of patient past medical history, allergies, medications, health status, including review of consultants reports, laboratory and other test data, was performed as part of comprehensive evaluation and provision of chronic care management services.   SDOH (Social Determinants of Health) assessments and interventions performed: No  CCM Care Plan  No Known Allergies  Outpatient Encounter Medications as of 12/01/2021  Medication Sig Note   alendronate (FOSAMAX) 70 MG tablet Take 1 tablet (70 mg total) by mouth once a week. With full glass of water and do not recline    amLODipine (NORVASC) 10 MG tablet Take 1 tablet (10 mg total) by mouth daily.    atorvastatin (LIPITOR) 80 MG tablet TAKE 1 TABLET BY MOUTH EVERY DAY    chlorthalidone (HYGROTON) 25 MG tablet Take 1 tablet (25 mg total) by mouth daily.    FLUoxetine (PROZAC) 10 MG capsule Take 1 capsule (10 mg total) by mouth daily.    fluticasone (FLONASE) 50 MCG/ACT nasal spray SPRAY 2 SPRAYS IN EACH NOSTRIL AT BEDTIME    furosemide (LASIX) 40 MG tablet Take 1 tablet (40 mg total) by mouth as needed. Take at least once a week    Incontinence Supply Disposable ([employer] BLADDER CONTROL PAD) MISC 1 each by Does not apply route daily. 07/28/2021: Pt getting incontinence supplies through [place]   levothyroxine (SYNTHROID) 50 MCG tablet Take 1-2 tablets (50-100 mcg total) by mouth daily. Recheck in 6 weeks    lisinopril (PRINIVIL,ZESTRIL) 20 MG tablet TAKE 1 TABLET (20 MG TOTAL) BY MOUTH DAILY.    OS-CAL CALCIUM + D3 500-200 MG-UNIT  TABS TAKE 1 TABLET BY MOUTH EVERY DAY (Patient not taking: Reported on 07/28/2021)    sotalol (BETAPACE) 80 MG tablet Take 1 tablet (80 mg total) by mouth 2 (two) times daily. MUST BE SEEN IN OFFICE FOR FURTHER REFILLS    Vitamin D, Ergocalciferol, (DRISDOL) 1.25 MG (50000 UNIT) CAPS capsule TAKE 1 CAPSULE (50,000 UNITS TOTAL) BY MOUTH EVERY 7 (SEVEN) DAYS. (Patient not taking: Reported on 07/28/2021)    warfarin (COUMADIN) 2.5 MG tablet Take 1-1.5 tablets daily as directed by the anti-coag clinic    No facility-administered encounter medications on file as of 12/01/2021.    Patient Active Problem List   Diagnosis Date Noted   Major depression, recurrent, chronic ([place]) 04/10/2020   Stage 3a chronic kidney disease ([place]) 04/10/2020   Age-related osteoporosis without current pathological fracture 04/10/2020   Thrombocytopenia ([place]) 04/10/2020   Angina pectoris ([place]) 04/11/2017   Senile purpura (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) 02/22/2013   Essential hypertension, benign    Atrial myxoma    Coronary atherosclerosis     Outreach with Ms. Henard's daughter/caregiver [employer]. Discussed needs related to chronic care  management. 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 Indicated her main concern is managing medications. Reports she is currently preparing meds and prefers that the medications are prefilled. She is aware of need for a provider follow up. Contacted the scheduling center to arrange a PCP visit for 12/11/21. Will  forward a notification to the CCM Pharmacist regarding request for adherence/compliance packaging.  Daughter agreed to contact the clinic if additional outreach is required. The care team will gladly assist.  PLAN: [name] daughter/caregiver will contact the clinic if additional outreach is required.  The care team will gladly assist.   [facility] Care Management [phone]

## 2021-12-02 ENCOUNTER — Encounter: Payer: Self-pay | Admitting: Nurse Practitioner

## 2021-12-02 ENCOUNTER — Ambulatory Visit (INDEPENDENT_AMBULATORY_CARE_PROVIDER_SITE_OTHER): Payer: Medicare Other

## 2021-12-02 ENCOUNTER — Ambulatory Visit (INDEPENDENT_AMBULATORY_CARE_PROVIDER_SITE_OTHER): Payer: Medicare Other | Admitting: Nurse Practitioner

## 2021-12-02 VITALS — BP 150/72 | HR 73 | Ht <= 58 in | Wt 127.0 lb

## 2021-12-02 DIAGNOSIS — Z5181 Encounter for therapeutic drug level monitoring: Secondary | ICD-10-CM

## 2021-12-02 DIAGNOSIS — I2510 Atherosclerotic heart disease of native coronary artery without angina pectoris: Secondary | ICD-10-CM | POA: Diagnosis not present

## 2021-12-02 DIAGNOSIS — I495 Sick sinus syndrome: Secondary | ICD-10-CM | POA: Diagnosis not present

## 2021-12-02 DIAGNOSIS — I4891 Unspecified atrial fibrillation: Secondary | ICD-10-CM | POA: Diagnosis not present

## 2021-12-02 DIAGNOSIS — E7800 Pure hypercholesterolemia, unspecified: Secondary | ICD-10-CM | POA: Diagnosis not present

## 2021-12-02 DIAGNOSIS — I428 Other cardiomyopathies: Secondary | ICD-10-CM

## 2021-12-02 DIAGNOSIS — I10 Essential (primary) hypertension: Secondary | ICD-10-CM | POA: Diagnosis not present

## 2021-12-02 DIAGNOSIS — I209 Angina pectoris, unspecified: Secondary | ICD-10-CM

## 2021-12-02 LAB — POCT INR: INR: 1 — AB (ref 2.0–3.0)

## 2021-12-02 NOTE — Patient Instructions (Addendum)
Medication Instructions:  No changes at this time.   *If you need a refill on your cardiac medications before your next appointment, please call your pharmacy*   Lab Work: [place] go to the [place] at [facility] then go to 1st desk on the right to check in (REGISTRATION). No appointment is needed     Lab hours: Monday- Friday (7:30 am- 5:30 pm)  If you have labs (blood work) drawn today and your tests are completely normal, you will receive your results only by: [place] (if you have MyChart) OR A paper copy in the mail If you have any lab test that is abnormal or we need to change your treatment, we will call you to review the results.   Testing/Procedures: Your physician has requested that you have an echocardiogram. Echocardiography is a painless test that uses sound waves to create images of your heart. It provides your doctor with information about the size and shape of your heart and how well your heart's chambers and valves are working. This procedure takes approximately one hour. There are no restrictions for this procedure.    Follow-Up: At [facility], you and your health needs are our priority.  As part of our continuing mission to provide you with exceptional heart care, we have created designated Provider Care Teams.  These Care Teams include your primary Cardiologist (physician) and Advanced Practice Providers (APPs -  Physician Assistants and Nurse Practitioners) who all work together to provide you with the care you need, when you need it.   Your next appointment:   Keep scheduled appointments: 12/16/21 at 11:15 am with [facility] 12/22/21 at 10:40 am with Dr. Caryl Comes       Important Information About Sugar

## 2021-12-02 NOTE — Patient Instructions (Signed)
-   TAKE 3 TABLETS TODAY AND 2 TABLETS THURSDAY ONLY and then continue dosage of warfarin 1.5 tablets every day EXCEPT 2 TABLETS ON MONDAYS, [place]. - recheck in 2 weeks;  NO GREENS FOR NEXT FEW DAYS

## 2021-12-10 NOTE — Progress Notes (Deleted)
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 She takes diuretic prn a couple of times a week only.   Senile purpura: on both arms, stable and reassurance given. Unchanged   MDD: she has a long history of depression, but worse she became a widow about 9 years ago. She was zoloft but she stopped because it caused headaches, we gave her prozac 20 mg but she is still taking zoloft ( old rx that she brought it in) , advised to come in for memory test also. Currently worried about her granddaughter 32 yo that is in the hospital with a brain bleed   Atrial fibrillation: has pacemaker for tachycardia-bradycardia syndrome, goes to coumadin clinic, sees Dr. Caryl Comes, denies bleeding. We will try to send medication to [facility] . We will send her for a regular follow up also. Afraid to resume same dose of bp medications since not sure what she has at home   CHF systolic: she is on lasix, ace inhibitor ( but not on her medication bag)  She denies orthopnea, but no lower extremity edema. Daughter states some wheezing and SOB with activity intermittently  Hypothyroidism: taking levothyroxine 75 mcg daily, last dose was supposed to be down to 50 mcg, we will recheck labs today, denies hair loss or dry skin, no change in bowel movement   GERD: she states doing better, no  recent episodes of choking. No hurt burn or indigestion   HTN: does not seem to be taking any of her medications for bp at this time. We will contact Alex to try [facility]   Patient Active Problem List   Diagnosis Date Noted   Major depression, recurrent, chronic ([place]) 04/10/2020   Stage 3a chronic kidney disease ([place]) 04/10/2020   Age-related osteoporosis without current pathological fracture 04/10/2020   Thrombocytopenia ([place]) 04/10/2020   Angina pectoris ([place]) 04/11/2017   Senile purpura ([place]) 04/29/2015   Chronic anticoagulation 04/29/2015   Obesity 12/10/2014   Hypercholesterolemia 10/15/2013   Myoma 10/15/2013   Encounter for therapeutic drug monitoring 08/08/2013   Pacemaker -Medtronic 05/24/2013   Nonischemic cardiomyopathy ([place])    Atrial fibrillation ([place]) 02/28/2013   Arteriosclerosis of coronary artery 02/22/2013   Adult hypothyroidism 02/22/2013   Congestive heart failure with left ventricular systolic dysfunction ([place]) 02/22/2013   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 [id number]   VAGINAL HYSTERECTOMY      Family History  Problem Relation Age of Onset   Heart failure Mother    Cirrhosis Mother    Hyperlipidemia Father    Hypertension Father    Stroke Father    Heart attack Father     Social History   Tobacco Use   Smoking status: Never   Smokeless tobacco: Never   Tobacco comments:    smoking cessation materials not required  Substance Use Topics   Alcohol use: No     Current Outpatient Medications:    alendronate (FOSAMAX) 70 MG tablet, Take 1 tablet (70 mg total) by mouth  once a week. With full glass of water and do not recline, Disp: 12 tablet, Rfl: 1   amLODipine (NORVASC) 10 MG tablet, Take 1 tablet (10 mg total) by mouth daily., Disp: 90 tablet, Rfl: 3   atorvastatin (LIPITOR) 80 MG tablet, TAKE 1 TABLET BY MOUTH EVERY DAY, Disp: 30 tablet, Rfl: 6   chlorthalidone (HYGROTON) 25 MG tablet, Take 1 tablet (25 mg total) by mouth daily., Disp: 90 tablet, Rfl: 3   FLUoxetine (PROZAC) 10 MG capsule, Take 1 capsule (10 mg total) by mouth daily., Disp: 90 capsule, Rfl: 0   fluticasone (FLONASE) 50 MCG/ACT nasal spray, SPRAY 2 SPRAYS IN EACH NOSTRIL AT BEDTIME, Disp: 48 mL, Rfl: 1   furosemide (LASIX) 40 MG tablet, Take 1 tablet (40 mg total) by mouth as needed. Take at least once a week, Disp: 90 tablet, Rfl: 1   Incontinence Supply Disposable ([employer] BLADDER CONTROL PAD) MISC, 1 each by Does not apply route daily., Disp: 100 each, Rfl: 1   levothyroxine (SYNTHROID) 50 MCG tablet, Take 1-2 tablets (50-100 mcg total) by mouth daily. Recheck in 6 weeks, Disp: 180 tablet, Rfl: 0   lisinopril (PRINIVIL,ZESTRIL) 20 MG tablet, TAKE 1 TABLET (20 MG TOTAL) BY MOUTH DAILY., Disp: 90 tablet, Rfl: 3   OS-CAL CALCIUM + D3 500-200 MG-UNIT TABS, TAKE 1 TABLET BY MOUTH EVERY DAY, Disp: 90 tablet, Rfl: 0   sotalol (BETAPACE) 80 MG tablet, Take 1 tablet (80 mg total) by mouth 2 (two) times daily. MUST BE SEEN IN OFFICE FOR FURTHER REFILLS, Disp: 60 tablet, Rfl: 0   Vitamin D, Ergocalciferol, (DRISDOL) 1.25 MG (50000 UNIT) CAPS capsule, TAKE 1 CAPSULE (50,000 UNITS TOTAL) BY MOUTH EVERY 7 (SEVEN) DAYS., Disp: 12 capsule, Rfl: 3   warfarin (COUMADIN) 2.5 MG tablet, Take 1-1.5 tablets daily as directed by the anti-coag clinic, Disp: 150 tablet, Rfl: 0  No Known Allergies  I personally reviewed active problem list, medication list, allergies, family history, social history, health maintenance with the patient/caregiver today.   ROS  ***  Objective  There were no vitals filed for this  visit.  There is no height or weight on file to calculate BMI.  Physical Exam ***  Recent Results (from the past 2160 hour(s))  POCT INR     Status: None   Collection Time: 09/23/21 12:01 PM  Result Value Ref Range   INR 2.0 2.0 - 3.0  POCT INR     Status: Abnormal   Collection Time: 12/02/21  9:51 AM  Result Value Ref Range   INR 1.0 (A) 2.0 - 3.0    PHQ2/9:    07/28/2021    8:53 AM 07/21/2021   10:59 AM 12/22/2020    9:14 AM 06/17/2020   11:47 AM 04/10/2020   10:05 AM  Depression screen PHQ 2/9  Decreased Interest 0 0 0 1 1  Down, Depressed, Hopeless '1 1 2 '$ 1  1  PHQ - 2 Score '1 1 2 2 2  '$ Altered sleeping 0 0 1 0 1  Tired, decreased energy 0 0 '2 1 3  '$ Change in appetite 0 0 0 0 0  Feeling bad or failure about yourself  0 0 0 0 0  Trouble concentrating 0 0 0 0 0  Moving slowly or fidgety/restless 0 0 0 0 0  Suicidal thoughts 0 0 0 0 0  PHQ-9 Score '1 1 5 3 6  '$ Difficult doing work/chores Not difficult at all Not difficult at all Somewhat difficult Not difficult at all Not difficult at all    phq 9 is {gen pos [id number]}   Fall Risk:    07/28/2021    8:55 AM 07/21/2021   10:58 AM 12/22/2020    9:14 AM 06/17/2020   11:51 AM 04/10/2020   10:05 AM  Fall Risk   Falls in the past year? 0 0 0 0 0  Number falls in past yr: 0 0 0 0 0  Injury with Fall? 0 0 0 0 0  Risk for fall due to : No Fall Risks No Fall Risks  No Fall Risks   Follow up Falls prevention discussed   Falls prevention discussed       Functional Status Survey:      Assessment & Plan  *** There are no diagnoses linked to this encounter.

## 2021-12-11 ENCOUNTER — Ambulatory Visit: Payer: Medicare Other | Admitting: Family Medicine

## 2021-12-16 ENCOUNTER — Ambulatory Visit (INDEPENDENT_AMBULATORY_CARE_PROVIDER_SITE_OTHER): Payer: Medicare Other

## 2021-12-16 DIAGNOSIS — Z5181 Encounter for therapeutic drug level monitoring: Secondary | ICD-10-CM | POA: Diagnosis not present

## 2021-12-16 DIAGNOSIS — I4891 Unspecified atrial fibrillation: Secondary | ICD-10-CM | POA: Diagnosis not present

## 2021-12-16 LAB — POCT INR: INR: 1.1 — AB (ref 2.0–3.0)

## 2021-12-16 NOTE — Patient Instructions (Signed)
Description    TAKE 3 TABLETS TODAY AND TOMORROW then resume dosage of Warfarin 1.5 tablets every day EXCEPT 2 TABLETS ON MONDAYS, [place]. Recheck in 1 week.  NO GREENS FOR NEXT FEW DAYS

## 2021-12-22 ENCOUNTER — Encounter: Payer: Medicare Other | Admitting: Internal Medicine

## 2021-12-22 DIAGNOSIS — I480 Paroxysmal atrial fibrillation: Secondary | ICD-10-CM

## 2021-12-22 DIAGNOSIS — Z950 Presence of cardiac pacemaker: Secondary | ICD-10-CM

## 2021-12-22 DIAGNOSIS — I428 Other cardiomyopathies: Secondary | ICD-10-CM

## 2021-12-22 DIAGNOSIS — Z79899 Other long term (current) drug therapy: Secondary | ICD-10-CM

## 2021-12-22 DIAGNOSIS — I495 Sick sinus syndrome: Secondary | ICD-10-CM

## 2021-12-23 ENCOUNTER — Encounter: Payer: Self-pay | Admitting: Internal Medicine

## 2021-12-30 ENCOUNTER — Ambulatory Visit (INDEPENDENT_AMBULATORY_CARE_PROVIDER_SITE_OTHER): Payer: Medicare Other

## 2021-12-30 DIAGNOSIS — I4891 Unspecified atrial fibrillation: Secondary | ICD-10-CM | POA: Diagnosis not present

## 2021-12-30 DIAGNOSIS — Z5181 Encounter for therapeutic drug level monitoring: Secondary | ICD-10-CM

## 2021-12-30 LAB — POCT INR: INR: 1.6 — AB (ref 2.0–3.0)

## 2021-12-30 NOTE — Patient Instructions (Signed)
TAKE 3 TABLETS TODAY AND then resume dosage of Warfarin 1.5 tablets every day EXCEPT 2 TABLETS ON MONDAYS, [place]. Recheck in 2 weeks.  NO GREENS FOR NEXT FEW DAYS

## 2022-01-08 ENCOUNTER — Other Ambulatory Visit: Payer: Medicare Other

## 2022-01-13 ENCOUNTER — Ambulatory Visit (INDEPENDENT_AMBULATORY_CARE_PROVIDER_SITE_OTHER): Payer: Medicare Other

## 2022-01-13 DIAGNOSIS — I4891 Unspecified atrial fibrillation: Secondary | ICD-10-CM | POA: Diagnosis not present

## 2022-01-13 DIAGNOSIS — Z5181 Encounter for therapeutic drug level monitoring: Secondary | ICD-10-CM | POA: Diagnosis not present

## 2022-01-13 LAB — POCT INR: INR: 1.8 — AB (ref 2.0–3.0)

## 2022-01-13 NOTE — Patient Instructions (Signed)
TAKE 3 TABLETS TODAY AND then INCREASE TO 2 tablets every day EXCEPT 1.5 TABLETS ON MONDAYS, [place]. Recheck in 3 weeks.

## 2022-01-20 ENCOUNTER — Ambulatory Visit: Payer: Self-pay

## 2022-01-20 NOTE — Chronic Care Management (AMB) (Signed)
   01/20/2022  Felicia Poole [birth year] [id number]   Documentation encounter created to complete case transition. The care management team will continue to follow for care coordination as needed.   [place] Management [phone]

## 2022-02-03 ENCOUNTER — Ambulatory Visit: Payer: Medicare Other

## 2022-02-10 ENCOUNTER — Ambulatory Visit: Payer: Medicare Other | Attending: Internal Medicine

## 2022-02-10 DIAGNOSIS — Z5181 Encounter for therapeutic drug level monitoring: Secondary | ICD-10-CM

## 2022-02-10 DIAGNOSIS — I4891 Unspecified atrial fibrillation: Secondary | ICD-10-CM | POA: Diagnosis not present

## 2022-02-10 LAB — POCT INR: INR: 1.2 — AB (ref 2.0–3.0)

## 2022-02-10 NOTE — Patient Instructions (Signed)
TAKE 2 TABLETS TODAY AND 3 THURSDAY  then continue 2 tablets every day EXCEPT 1.5 TABLETS ON MONDAYS, [place]. Recheck in 2 weeks.

## 2022-02-24 ENCOUNTER — Ambulatory Visit: Payer: Medicare Other | Attending: Internal Medicine

## 2022-02-24 DIAGNOSIS — I4891 Unspecified atrial fibrillation: Secondary | ICD-10-CM | POA: Diagnosis not present

## 2022-02-24 DIAGNOSIS — Z5181 Encounter for therapeutic drug level monitoring: Secondary | ICD-10-CM | POA: Diagnosis not present

## 2022-02-24 LAB — POCT INR: INR: 1.6 — AB (ref 2.0–3.0)

## 2022-02-24 NOTE — Patient Instructions (Signed)
Take 3 tablets today only and then INCREASE TO  2 tablets every day EXCEPT 1.5 TABLETS ON MONDAYS & FRIDAYS. Recheck in 2 weeks.

## 2022-03-10 ENCOUNTER — Ambulatory Visit: Payer: Medicare Other | Attending: Internal Medicine

## 2022-03-10 DIAGNOSIS — I4891 Unspecified atrial fibrillation: Secondary | ICD-10-CM | POA: Diagnosis not present

## 2022-03-10 DIAGNOSIS — Z5181 Encounter for therapeutic drug level monitoring: Secondary | ICD-10-CM | POA: Diagnosis not present

## 2022-03-10 LAB — POCT INR: INR: 1.3 — AB (ref 2.0–3.0)

## 2022-03-10 NOTE — Patient Instructions (Signed)
Take 3 tablets today only and then INCREASE TO  2 tablets every day. Recheck in 2 weeks.

## 2022-03-13 ENCOUNTER — Other Ambulatory Visit: Payer: Self-pay | Admitting: Cardiovascular Disease

## 2022-03-13 ENCOUNTER — Other Ambulatory Visit: Payer: Self-pay | Admitting: Family Medicine

## 2022-03-13 DIAGNOSIS — E039 Hypothyroidism, unspecified: Secondary | ICD-10-CM

## 2022-03-15 NOTE — Telephone Encounter (Signed)
Refill request for warfarin:  Last INR was 1.3 on 03/10/22 Next INR due on 03/24/22 LOV was 12/02/21  Elvin So NP  Refill approved.

## 2022-03-15 NOTE — Telephone Encounter (Signed)
She needs an appt for refills

## 2022-03-24 ENCOUNTER — Ambulatory Visit: Payer: Medicare Other

## 2022-03-31 ENCOUNTER — Ambulatory Visit: Payer: Medicare Other | Attending: Internal Medicine

## 2022-03-31 DIAGNOSIS — I4891 Unspecified atrial fibrillation: Secondary | ICD-10-CM | POA: Diagnosis not present

## 2022-03-31 DIAGNOSIS — Z5181 Encounter for therapeutic drug level monitoring: Secondary | ICD-10-CM

## 2022-03-31 LAB — POCT INR: INR: 2.1 (ref 2.0–3.0)

## 2022-03-31 NOTE — Patient Instructions (Signed)
Continue  2 tablets every day. Recheck in 6 weeks.   

## 2022-05-12 ENCOUNTER — Ambulatory Visit: Payer: Medicare Other | Attending: Internal Medicine

## 2022-05-12 DIAGNOSIS — Z5181 Encounter for therapeutic drug level monitoring: Secondary | ICD-10-CM

## 2022-05-12 DIAGNOSIS — I4891 Unspecified atrial fibrillation: Secondary | ICD-10-CM

## 2022-05-12 LAB — POCT INR: INR: 2.5 (ref 2.0–3.0)

## 2022-05-12 NOTE — Patient Instructions (Signed)
Continue  2 tablets every day. Recheck in 6 weeks.

## 2022-06-14 ENCOUNTER — Other Ambulatory Visit: Payer: Self-pay | Admitting: Family Medicine

## 2022-06-14 DIAGNOSIS — E039 Hypothyroidism, unspecified: Secondary | ICD-10-CM

## 2022-06-14 DIAGNOSIS — M8000XA Age-related osteoporosis with current pathological fracture, unspecified site, initial encounter for fracture: Secondary | ICD-10-CM

## 2022-06-14 NOTE — Telephone Encounter (Signed)
Medication Refill - Medication: alendronate (FOSAMAX) 70 MG tablet   Has the patient contacted their pharmacy? Yes.   (Agent: If no, request that the patient contact the pharmacy for the refill. If patient does not wish to contact the pharmacy document the reason why and proceed with request.) (Agent: If yes, when and what did the pharmacy advise?)  Preferred Pharmacy (with phone number or street name):  [facility] [place] - [address] Phone: [phone]  Fax: [phone]     Has the patient been seen for an appointment in the last year OR does the patient have an upcoming appointment? Yes.    Agent: Please be advised that RX refills may take up to 3 business days. We ask that you follow-up with your pharmacy.

## 2022-06-14 NOTE — Telephone Encounter (Signed)
Patient is scheduled to see pcp on 06/21/22.

## 2022-06-15 NOTE — Telephone Encounter (Signed)
Requested medication (s) are due for refill today: yes  Requested medication (s) are on the active medication list: yes Last refill:  10/23/21  Future visit scheduled:yes  Notes to clinic:  DX Code Needed  NEEDS REFILL.      Requested Prescriptions  Pending Prescriptions Disp Refills   alendronate (FOSAMAX) 70 MG tablet 12 tablet 1    Sig: Take 1 tablet (70 mg total) by mouth once a week. With full glass of water and do not recline     Endocrinology:  Bisphosphonates Failed - 06/14/2022  4:42 PM      Failed - Vitamin D in normal range and within 360 days    Vit D, 25-Hydroxy  Date Value Ref Range Status  04/10/2020 71 30 - 100 ng/mL Final    Comment:    Vitamin D Status         25-OH Vitamin D: . Deficiency:                    <20 ng/mL Insufficiency:             20 - 29 ng/mL Optimal:                 > or = 30 ng/mL . For 25-OH Vitamin D testing on patients on  D2-supplementation and patients for whom quantitation  of D2 and D3 fractions is required, the QuestAssureD(TM) 25-OH VIT D, (D2,D3), LC/MS/MS is recommended: order  code [phone] (patients >16yr). See Note 1 . Note 1 . For additional information, please refer to  http://education.QuestDiagnostics.com/faq/FAQ199  (This link is being provided for informational/ educational purposes only.)          Failed - Cr in normal range and within 360 days    Creat  Date Value Ref Range Status  07/21/2021 1.05 (H) 0.60 - 1.00 mg/dL Final         Failed - Mg Level in normal range and within 360 days    Magnesium  Date Value Ref Range Status  03/04/2020 1.8 1.6 - 2.3 mg/dL Final         Failed - Phosphate in normal range and within 360 days    No results found for: "PHOS"       Failed - Bone Mineral Density or Dexa Scan completed in the last 2 years      Passed - Ca in normal range and within 360 days    Calcium  Date Value Ref Range Status  07/21/2021 9.8 8.6 - 10.4 mg/dL Final         Passed - eGFR is 30 or  above and within 360 days    GFR, Est African American  Date Value Ref Range Status  04/10/2020 72 > OR = 60 mL/min/1.732mFinal   GFR, Est Non African American  Date Value Ref Range Status  04/10/2020 63 > OR = 60 mL/min/1.7346minal   eGFR  Date Value Ref Range Status  07/21/2021 55 (L) > OR = 60 mL/min/1.84m35mnal    Comment:    The eGFR is based on the CKD-EPI 2021 equation. To calculate  the new eGFR from a previous Creatinine or Cystatin C result, go to https://www.kidney.org/professionals/ kdoqi/gfr%5Fcalculator          Passed - Valid encounter within last 12 months    Recent Outpatient Visits           10 months ago [facility]ischemic [facility]  [facility]   1 year ago Congestive heart failure with left ventricular systolic dysfunction [facility])   [facility] Steele Sizer, MD   2 years ago Senile purpura [facility])   [facility] Steele Sizer, MD   3 years ago GERD without esophagitis   [facility] Steele Sizer, MD   3 years ago Major depression, recurrent, chronic [facility])   [facility] Steele Sizer, MD       Future Appointments             In 6 days Steele Sizer, MD [facility], [place]   In 1 month  [place]

## 2022-06-21 ENCOUNTER — Ambulatory Visit: Payer: Medicare Other | Admitting: Family Medicine

## 2022-06-23 ENCOUNTER — Other Ambulatory Visit: Payer: Self-pay | Admitting: Family Medicine

## 2022-06-23 ENCOUNTER — Ambulatory Visit: Payer: 59 | Attending: Internal Medicine

## 2022-06-23 DIAGNOSIS — Z5181 Encounter for therapeutic drug level monitoring: Secondary | ICD-10-CM | POA: Diagnosis not present

## 2022-06-23 DIAGNOSIS — M8000XA Age-related osteoporosis with current pathological fracture, unspecified site, initial encounter for fracture: Secondary | ICD-10-CM

## 2022-06-23 DIAGNOSIS — E039 Hypothyroidism, unspecified: Secondary | ICD-10-CM

## 2022-06-23 DIAGNOSIS — I4891 Unspecified atrial fibrillation: Secondary | ICD-10-CM

## 2022-06-23 LAB — POCT INR: INR: 5 — AB (ref 2.0–3.0)

## 2022-06-23 NOTE — Patient Instructions (Signed)
HOLD TODAY, THURSDAY AND FRIDAY THEN Continue  2 tablets every day. Recheck in 2 weeks.

## 2022-06-23 NOTE — Telephone Encounter (Signed)
Medication Refill - Medication: alendronate (FOSAMAX) 70 MG tablet   Has the patient contacted their pharmacy? Yes.     Preferred Pharmacy (with phone number or street name):  [facility]), [place] - [address] Phone: [phone]  Fax: [phone]     Has the patient been seen for an appointment in the last year OR does the patient have an upcoming appointment? Yes.    She also wants to make sure her levothyroxine (SYNTHROID) 50 MCG tablet has been called in as well. Please assist patient further

## 2022-06-24 ENCOUNTER — Other Ambulatory Visit: Payer: Self-pay

## 2022-06-24 DIAGNOSIS — M8000XA Age-related osteoporosis with current pathological fracture, unspecified site, initial encounter for fracture: Secondary | ICD-10-CM

## 2022-06-24 MED ORDER — ALENDRONATE SODIUM 70 MG PO TABS: 70.0000 mg | ORAL_TABLET | ORAL | 1 refills | Status: DC

## 2022-06-24 NOTE — Telephone Encounter (Signed)
Requested medication (s) are due for refill today:   Yes  Requested medication (s) are on the active medication list:   Yes  Future visit scheduled:   Yes 06/29/2022 with Dr. Ancil Boozer   Last ordered: Fosamax 10/23/2021 #12, 1 refill;   Last minute request for Synthroid 50 mcg 10/22/2021 #180, 0 refills  Returned because labs and a bone density are due per protocol.   Has an upcoming appt.   Pharmacy requesting a DX Code.    Requested Prescriptions  Pending Prescriptions Disp Refills   alendronate (FOSAMAX) 70 MG tablet 12 tablet 1    Sig: Take 1 tablet (70 mg total) by mouth once a week. With full glass of water and do not recline     Endocrinology:  Bisphosphonates Failed - 06/23/2022  4:54 PM      Failed - Vitamin D in normal range and within 360 days    Vit D, 25-Hydroxy  Date Value Ref Range Status  04/10/2020 71 30 - 100 ng/mL Final    Comment:    Vitamin D Status         25-OH Vitamin D: . Deficiency:                    <20 ng/mL Insufficiency:             20 - 29 ng/mL Optimal:                 > or = 30 ng/mL . For 25-OH Vitamin D testing on patients on  D2-supplementation and patients for whom quantitation  of D2 and D3 fractions is required, the [facility](TM) 25-OH VIT D, (D2,D3), LC/MS/MS is recommended: order  code [phone] (patients >30yr). See Note 1 . Note 1 . For additional information, please refer to  http://education.[facility].com/faq/FAQ199  (This link is being provided for informational/ educational purposes only.)          Failed - Cr in normal range and within 360 days    Creat  Date Value Ref Range Status  07/21/2021 1.05 (H) 0.60 - 1.00 mg/dL Final         Failed - Mg Level in normal range and within 360 days    Magnesium  Date Value Ref Range Status  03/04/2020 1.8 1.6 - 2.3 mg/dL Final         Failed - Phosphate in normal range and within 360 days    No results found for: "PHOS"       Failed - Bone Mineral Density or Dexa Scan  completed in the last 2 years      Passed - Ca in normal range and within 360 days    Calcium  Date Value Ref Range Status  07/21/2021 9.8 8.6 - 10.4 mg/dL Final         Passed - eGFR is 30 or above and within 360 days    GFR, Est African American  Date Value Ref Range Status  04/10/2020 72 > OR = 60 mL/min/1.719mFinal   GFR, Est Non African American  Date Value Ref Range Status  04/10/2020 63 > OR = 60 mL/min/1.7365minal   eGFR  Date Value Ref Range Status  07/21/2021 55 (L) > OR = 60 mL/min/1.38m88mnal    Comment:    The eGFR is based on the CKD-EPI 2021 equation. To calculate  the new eGFR from a previous Creatinine or Cystatin C result, go to https://www.kidney.org/professionals/ kdoqi/gfr%5Fcalculator  Passed - Valid encounter within last 12 months    Recent Outpatient Visits           11 months ago Nonischemic cardiomyopathy [facility])   [facility], Drue Stager, MD   1 year ago Congestive heart failure with left ventricular systolic dysfunction [facility])   [facility] Steele Sizer, MD   2 years ago Senile purpura [facility])   [facility] Steele Sizer, MD   3 years ago GERD without esophagitis   [facility] Steele Sizer, MD   3 years ago Major depression, recurrent, chronic [facility])   [facility] Steele Sizer, MD       Future Appointments             In 5 days Steele Sizer, MD [facility], [place]   In 1 month  [place]

## 2022-06-25 ENCOUNTER — Other Ambulatory Visit: Payer: Self-pay

## 2022-06-25 DIAGNOSIS — E039 Hypothyroidism, unspecified: Secondary | ICD-10-CM

## 2022-06-25 MED ORDER — LEVOTHYROXINE SODIUM 50 MCG PO TABS: 50.0000 ug | ORAL_TABLET | Freq: Every day | ORAL | 0 refills | Status: DC

## 2022-06-28 NOTE — Progress Notes (Addendum)
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 She has been taking furosemide but out of potassium tablets ( at least not on her bag)   Senile purpura: on both arms, stable. Unchanged   MDD: she has a long history of depression, but worse she became a widow about 10 years ago. She was zoloft but she stopped because it caused headaches, we gave her prozac '10mg'$  and states more stressed lately and would like to go up on dose to 20 mg dose .She is sleeping okay   Atrial fibrillation: has pacemaker for tachycardia-bradycardia syndrome, goes to coumadin clinic, sees Dr. Caryl Comes, denies bleeding but INR today was very high at over 5 and medication was held. Denies palpitation   CHF systolic: she is on lasix, ace inhibitor ( but not on her medication bag)  She denies orthopnea, but no lower extremity edema. Daughter states some wheezing and SOB with activity intermittently that has been stable, reminded her to follow up with cardiologist   Hypothyroidism: last TSH one year ago was elevated, she is currently taking levothyroxine 50 mcg daily and we will recheck level, she states  compliant with medication. She denies hair loss or dry skin, no change in bowel movement   GERD: she states doing better, denies heartburn or indigestion  OA: deformities on both hands on fingers , denies joint pain on lower extremities , discussed tylenol only prn   CKI : we will recheck labs, denies pruritus, good urine output, she is not sure if taking ACE. We will recheck labs   HTN: does not seem to be taking any of her medications for bp at this time. We had discussed upstream to increase compliance but she is still using Product/process development scientist.   Incontinence: chronic, she uses depends twice daily, she has urge incontinence and some times overflow incontinence. It is medically necessary for her to wear depends    Patient Active Problem List   Diagnosis Date Noted   Major depression, recurrent, chronic ([place]) 04/10/2020   Stage 3a chronic kidney disease ([place]) 04/10/2020   Age-related osteoporosis without current pathological fracture 04/10/2020   Thrombocytopenia ([place]) 04/10/2020   Angina pectoris ([place]) 04/11/2017   Senile purpura ([place]) 04/29/2015   Chronic anticoagulation 04/29/2015   Obesity 12/10/2014   Hypercholesterolemia 10/15/2013   Myoma 10/15/2013   Encounter for therapeutic drug monitoring 08/08/2013   Pacemaker -Medtronic 05/24/2013   Nonischemic cardiomyopathy ([place])    Atrial fibrillation ([place]) 02/28/2013   Arteriosclerosis of coronary artery 02/22/2013   Adult hypothyroidism 02/22/2013   Congestive heart failure with left ventricular systolic dysfunction ([place]) 02/22/2013   Tachycardia-bradycardia ([place]) 02/22/2013   Essential hypertension, benign  Atrial myxoma    Coronary atherosclerosis     Past Surgical History:  Procedure Laterality Date   ATRIAL MYXOMA EXCISION Left    CARDIAC CATHETERIZATION  2012   [facility]; right and left heart 75% ostial LAD lesion   CORONARY ARTERY BYPASS GRAFT  2011   [facility]   INSERT / REPLACE / REMOVE PACEMAKER  02/2013   Dual chamber  MDT advisa pacemaker. MVP-[id number]   VAGINAL HYSTERECTOMY      Family History  Problem Relation Age of Onset   Heart failure Mother    Cirrhosis Mother    Hyperlipidemia Father    Hypertension Father    Stroke Father    Heart attack Father     Social History   Tobacco Use   Smoking status: Never   Smokeless tobacco: Never   Tobacco comments:    smoking cessation materials not required  Substance Use Topics   Alcohol use: No     Current Outpatient Medications:    alendronate (FOSAMAX) 70 MG tablet, Take 1 tablet (70 mg total) by mouth once a week. With full glass of water and do not recline, Disp: 12 tablet, Rfl: 1   furosemide (LASIX) 40 MG tablet, Take 1 tablet (40 mg total) by mouth as needed. Take at least once a week, Disp: 90 tablet, Rfl: 1   Incontinence Supply Disposable ([employer] BLADDER CONTROL PAD) MISC, 1 each by Does not apply route daily., Disp: 100 each, Rfl: 1   lisinopril (PRINIVIL,ZESTRIL) 20 MG tablet, TAKE 1 TABLET (20 MG TOTAL) BY MOUTH DAILY., Disp: 90 tablet, Rfl: 3   sotalol (BETAPACE) 80 MG tablet, TAKE 1 TABLET BY MOUTH TWICE DAILY. MUST BE SEEN IN OFFICE FOR FURTHER REFILLS., Disp: 60 tablet, Rfl: 3   warfarin (COUMADIN) 2.5 MG tablet, TAKE TWO TABLET(S) BY MOUTH DAILY AS DIRECTED BY ANTI-COAG CLINIC, Disp: 180 tablet, Rfl: 1   atorvastatin (LIPITOR) 80 MG tablet, Take 1 tablet (80 mg total) by mouth daily., Disp: 90 tablet, Rfl: 1   FLUoxetine (PROZAC) 20 MG capsule, Take 1 capsule (20 mg total) by mouth daily., Disp: 90 capsule, Rfl: 1   fluticasone (FLONASE) 50 MCG/ACT nasal spray, SPRAY 2 SPRAYS IN EACH NOSTRIL AT BEDTIME, Disp: 48 mL, Rfl: 1   levothyroxine (SYNTHROID) 50 MCG tablet, Take 1 tablet (50 mcg total) by mouth daily. To last until follow up, Disp: 30 tablet, Rfl: 0   Vitamin D, Ergocalciferol, (DRISDOL) 1.25 MG (50000 UNIT) CAPS capsule, Take 1 capsule (50,000 Units total) by mouth every 7 (seven) days., Disp: 12 capsule, Rfl: 3  No Known  Allergies  I personally reviewed active problem list, medication list, allergies, family history, social history, health maintenance with the patient/caregiver today.   ROS  Ten systems reviewed and is negative except as mentioned in HPI   Objective  Vitals:   06/29/22 1330  BP: 122/70  Pulse: 84  Resp: 14  Temp: 97.8 F (36.6 C)  TempSrc: Oral  SpO2: 94%  Weight: 120 lb 8 oz (54.7 kg)  Height: '4\' 9"'$  (1.448 m)    Body mass index is 26.08 kg/m.  Physical Exam  Constitutional: Patient appears well-developed and well-nourished. No distress.  HEENT: head atraumatic, normocephalic, pupils equal and reactive to light, neck supple,  Cardiovascular: Normal rate, regular rhythm and normal heart sounds.  2/6  murmur heard. Trace BLE edema. Pulmonary/Chest: Effort normal and breath sounds normal. No respiratory distress. Abdominal: Soft.  There is no tenderness. Psychiatric: Patient has a normal mood  and affect. behavior is normal. Judgment and thought content normal.   Recent Results (from the past 2160 hour(s))  POCT INR     Status: None   Collection Time: 03/31/22  3:09 PM  Result Value Ref Range   INR 2.1 2.0 - 3.0   POC INR    POCT INR     Status: None   Collection Time: 05/12/22  3:21 PM  Result Value Ref Range   INR 2.5 2.0 - 3.0   POC INR    POCT INR     Status: Abnormal   Collection Time: 06/23/22  2:19 PM  Result Value Ref Range   INR 5.0 (A) 2.0 - 3.0   POC INR      PHQ2/9:    06/29/2022    1:32 PM 07/28/2021    8:53 AM 07/21/2021   10:59 AM 12/22/2020    9:14 AM 06/17/2020   11:47 AM  Depression screen PHQ 2/9  Decreased Interest 2 0 0 0 1  Down, Depressed, Hopeless '2 1 1 2 1  '$ PHQ - 2 Score '4 1 1 2 2  '$ Altered sleeping 1 0 0 1 0  Tired, decreased energy 1 0 0 2 1  Change in appetite 1 0 0 0 0  Feeling bad or failure about yourself  1 0 0 0 0  Trouble concentrating 3 0 0 0 0  Moving slowly or fidgety/restless 1 0 0 0 0  Suicidal thoughts 0 0 0 0 0   PHQ-9 Score '12 1 1 5 3  '$ Difficult doing work/chores Somewhat difficult Not difficult at all Not difficult at all Somewhat difficult Not difficult at all    phq 9 is negative   Fall Risk:    06/29/2022    1:31 PM 07/28/2021    8:55 AM 07/21/2021   10:58 AM 12/22/2020    9:14 AM 06/17/2020   11:51 AM  Fall Risk   Falls in the past year? 0 0 0 0 0  Number falls in past yr:  0 0 0 0  Injury with Fall?  0 0 0 0  Risk for fall due to : No Fall Risks No Fall Risks No Fall Risks  No Fall Risks  Follow up Falls prevention discussed;Education provided;Falls evaluation completed Falls prevention discussed   Falls prevention discussed    Assessment & Plan  1. Chronic atrial fibrillation (HCC)  On coumading, managed by cardiologist   2. Senile purpura ([place])  Reassurance given   3. Angina pectoris ([place])  - Lipid panel Controlled with medical management   4. Congestive heart failure with left ventricular systolic dysfunction (HCC)  Taking lasix every other day   5. Stage 3a chronic kidney disease (HCC)  - COMPLETE METABOLIC PANEL WITH GFR - CBC with Differential/Platelet  6. Major depression, recurrent, chronic (HCC)  - FLUoxetine (PROZAC) 20 MG capsule; Take 1 capsule (20 mg total) by mouth daily.  Dispense: 90 capsule; Refill: 1  7. Nonischemic cardiomyopathy ([place])  Keep follow up with cardiologist   8. GERD without esophagitis  Controlled   9. Age-related osteoporosis without current pathological fracture  - VITAMIN D 25 Hydroxy (Vit-D Deficiency, Fractures) - Vitamin D, Ergocalciferol, (DRISDOL) 1.25 MG (50000 UNIT) CAPS capsule; Take 1 capsule (50,000 Units total) by mouth every 7 (seven) days.  Dispense: 12 capsule; Refill: 3  10. Adult hypothyroidism  - TSH - levothyroxine (SYNTHROID) 50 MCG tablet; Take 1 tablet (50 mcg total) by mouth daily. To last until follow  up  Dispense: 30 tablet; Refill: 0  11. Hypercholesterolemia  - atorvastatin (LIPITOR) 80 MG  tablet; Take 1 tablet (80 mg total) by mouth daily.  Dispense: 90 tablet; Refill: 1  12. Essential hypertension, benign  Currently not taking medications, at least not on her bag  13. Seasonal allergies  - fluticasone (FLONASE) 50 MCG/ACT nasal spray; SPRAY 2 SPRAYS IN EACH NOSTRIL AT BEDTIME  Dispense: 48 mL; Refill: 1   14. Stress incontinence of urine  She uses depends two per day

## 2022-06-29 ENCOUNTER — Ambulatory Visit (INDEPENDENT_AMBULATORY_CARE_PROVIDER_SITE_OTHER): Payer: 59 | Admitting: Family Medicine

## 2022-06-29 ENCOUNTER — Encounter: Payer: Self-pay | Admitting: Family Medicine

## 2022-06-29 VITALS — BP 122/70 | HR 84 | Temp 97.8°F | Resp 14 | Ht <= 58 in | Wt 120.5 lb

## 2022-06-29 DIAGNOSIS — F339 Major depressive disorder, recurrent, unspecified: Secondary | ICD-10-CM

## 2022-06-29 DIAGNOSIS — E7800 Pure hypercholesterolemia, unspecified: Secondary | ICD-10-CM

## 2022-06-29 DIAGNOSIS — K219 Gastro-esophageal reflux disease without esophagitis: Secondary | ICD-10-CM | POA: Diagnosis not present

## 2022-06-29 DIAGNOSIS — D692 Other nonthrombocytopenic purpura: Secondary | ICD-10-CM | POA: Diagnosis not present

## 2022-06-29 DIAGNOSIS — I10 Essential (primary) hypertension: Secondary | ICD-10-CM

## 2022-06-29 DIAGNOSIS — N1831 Chronic kidney disease, stage 3a: Secondary | ICD-10-CM | POA: Diagnosis not present

## 2022-06-29 DIAGNOSIS — I428 Other cardiomyopathies: Secondary | ICD-10-CM | POA: Diagnosis not present

## 2022-06-29 DIAGNOSIS — I209 Angina pectoris, unspecified: Secondary | ICD-10-CM | POA: Diagnosis not present

## 2022-06-29 DIAGNOSIS — I5020 Unspecified systolic (congestive) heart failure: Secondary | ICD-10-CM | POA: Diagnosis not present

## 2022-06-29 DIAGNOSIS — I482 Chronic atrial fibrillation, unspecified: Secondary | ICD-10-CM | POA: Diagnosis not present

## 2022-06-29 DIAGNOSIS — E039 Hypothyroidism, unspecified: Secondary | ICD-10-CM

## 2022-06-29 DIAGNOSIS — J302 Other seasonal allergic rhinitis: Secondary | ICD-10-CM

## 2022-06-29 DIAGNOSIS — M810 Age-related osteoporosis without current pathological fracture: Secondary | ICD-10-CM | POA: Diagnosis not present

## 2022-06-29 DIAGNOSIS — N393 Stress incontinence (female) (male): Secondary | ICD-10-CM

## 2022-06-29 MED ORDER — INCONTINENCE BRIEF MEDIUM MISC: 1.0000 | Freq: Two times a day (BID) | 5 refills | Status: AC

## 2022-06-29 MED ORDER — FLUOXETINE HCL 20 MG PO CAPS: 20.0000 mg | ORAL_CAPSULE | Freq: Every day | ORAL | 1 refills | Status: DC

## 2022-06-29 MED ORDER — LEVOTHYROXINE SODIUM 50 MCG PO TABS: 50.0000 ug | ORAL_TABLET | Freq: Every day | ORAL | 0 refills | Status: DC

## 2022-06-29 MED ORDER — FLUTICASONE PROPIONATE 50 MCG/ACT NA SUSP: NASAL | 1 refills | Status: DC

## 2022-06-29 MED ORDER — ATORVASTATIN CALCIUM 80 MG PO TABS: 80.0000 mg | ORAL_TABLET | Freq: Every day | ORAL | 1 refills | Status: DC

## 2022-06-29 MED ORDER — VITAMIN D (ERGOCALCIFEROL) 1.25 MG (50000 UNIT) PO CAPS: 50000.0000 [IU] | ORAL_CAPSULE | ORAL | 3 refills | Status: AC

## 2022-06-30 LAB — COMPLETE METABOLIC PANEL WITH GFR
AG Ratio: 1.6 (calc) (ref 1.0–2.5)
ALT: 15 U/L (ref 6–29)
AST: 21 U/L (ref 10–35)
Albumin: 3.9 g/dL (ref 3.6–5.1)
Alkaline phosphatase (APISO): 73 U/L (ref 37–153)
BUN: 18 mg/dL (ref 7–25)
CO2: 29 mmol/L (ref 20–32)
Calcium: 9.3 mg/dL (ref 8.6–10.4)
Chloride: 106 mmol/L (ref 98–110)
Creat: 0.95 mg/dL (ref 0.60–1.00)
Globulin: 2.5 g/dL (calc) (ref 1.9–3.7)
Glucose, Bld: 66 mg/dL (ref 65–99)
Potassium: 4.1 mmol/L (ref 3.5–5.3)
Sodium: 143 mmol/L (ref 135–146)
Total Bilirubin: 0.7 mg/dL (ref 0.2–1.2)
Total Protein: 6.4 g/dL (ref 6.1–8.1)
eGFR: 62 mL/min/{1.73_m2} (ref >=60)

## 2022-06-30 LAB — CBC WITH DIFFERENTIAL/PLATELET
Absolute Monocytes: 249 cells/uL (ref 200–950)
Basophils Absolute: 28 cells/uL (ref 0–200)
Basophils Relative: 0.8 %
Eosinophils Absolute: 109 cells/uL (ref 15–500)
Eosinophils Relative: 3.1 %
HCT: 37.8 % (ref 35.0–45.0)
Hemoglobin: 12.8 g/dL (ref 11.7–15.5)
Lymphs Abs: 1019 cells/uL (ref 850–3900)
MCH: 32.8 pg (ref 27.0–33.0)
MCHC: 33.9 g/dL (ref 32.0–36.0)
MCV: 96.9 fL (ref 80.0–100.0)
MPV: 11.7 fL (ref 7.5–12.5)
Monocytes Relative: 7.1 %
Neutro Abs: 2097 cells/uL (ref 1500–7800)
Neutrophils Relative %: 59.9 %
Platelets: 152 10*3/uL (ref 140–400)
RBC: 3.9 10*6/uL (ref 3.80–5.10)
RDW: 12.2 % (ref 11.0–15.0)
Total Lymphocyte: 29.1 %
WBC: 3.5 10*3/uL — ABNORMAL LOW (ref 3.8–10.8)

## 2022-06-30 LAB — LIPID PANEL
Cholesterol: 193 mg/dL (ref <200)
HDL: 52 mg/dL (ref >=50)
LDL Cholesterol (Calc): 115 mg/dL (calc) — ABNORMAL HIGH
Non-HDL Cholesterol (Calc): 141 mg/dL (calc) — ABNORMAL HIGH (ref <130)
Total CHOL/HDL Ratio: 3.7 (calc) (ref <5.0)
Triglycerides: 147 mg/dL (ref <150)

## 2022-06-30 LAB — TSH: TSH: 5.43 mIU/L — ABNORMAL HIGH (ref 0.40–4.50)

## 2022-06-30 LAB — VITAMIN D 25 HYDROXY (VIT D DEFICIENCY, FRACTURES): Vit D, 25-Hydroxy: 24 ng/mL — ABNORMAL LOW (ref 30–100)

## 2022-07-05 ENCOUNTER — Encounter: Payer: Self-pay | Admitting: Nurse Practitioner

## 2022-07-05 NOTE — Progress Notes (Signed)
Unable to contact patient to schedule echocardiogram, letter sent, order cancelled. 

## 2022-07-07 ENCOUNTER — Ambulatory Visit: Payer: 59 | Attending: Internal Medicine

## 2022-07-07 DIAGNOSIS — Z5181 Encounter for therapeutic drug level monitoring: Secondary | ICD-10-CM | POA: Diagnosis not present

## 2022-07-07 DIAGNOSIS — I4891 Unspecified atrial fibrillation: Secondary | ICD-10-CM | POA: Diagnosis not present

## 2022-07-07 LAB — POCT INR: INR: 3.7 — AB (ref 2.0–3.0)

## 2022-07-07 NOTE — Patient Instructions (Signed)
HOLD TONIGHT ONLY THEN Continue  2 tablets every day. Recheck in 3 weeks.

## 2022-07-28 ENCOUNTER — Ambulatory Visit: Payer: 59 | Attending: Internal Medicine | Admitting: Pharmacist

## 2022-07-28 DIAGNOSIS — I4891 Unspecified atrial fibrillation: Secondary | ICD-10-CM

## 2022-07-28 DIAGNOSIS — Z5181 Encounter for therapeutic drug level monitoring: Secondary | ICD-10-CM | POA: Diagnosis not present

## 2022-07-28 LAB — POCT INR: INR: 1.6 — AB (ref 2.0–3.0)

## 2022-07-28 NOTE — Patient Instructions (Signed)
Description   TAKE 3 TABLETS TONIGHT ONLY and then continue 2 tablets every day. Recheck in 3 weeks.

## 2022-07-29 ENCOUNTER — Other Ambulatory Visit: Payer: Self-pay

## 2022-07-29 ENCOUNTER — Other Ambulatory Visit: Payer: Self-pay | Admitting: Family Medicine

## 2022-07-29 ENCOUNTER — Ambulatory Visit (INDEPENDENT_AMBULATORY_CARE_PROVIDER_SITE_OTHER): Payer: 59

## 2022-07-29 VITALS — Ht <= 58 in | Wt 120.0 lb

## 2022-07-29 DIAGNOSIS — Z0000 Encounter for general adult medical examination without abnormal findings: Secondary | ICD-10-CM

## 2022-07-29 DIAGNOSIS — I5020 Unspecified systolic (congestive) heart failure: Secondary | ICD-10-CM

## 2022-07-29 DIAGNOSIS — E039 Hypothyroidism, unspecified: Secondary | ICD-10-CM

## 2022-07-29 MED ORDER — LEVOTHYROXINE SODIUM 50 MCG PO TABS: 50.0000 ug | ORAL_TABLET | Freq: Every day | ORAL | 0 refills | Status: AC

## 2022-07-29 NOTE — Progress Notes (Signed)
I connected with  Felicia Poole and her daughter Felicia Poole on 07/29/22 by a audio telephone and verified that I am speaking with the correct person using two identifiers.  Patient Location: Home  Provider Location: Office/Clinic  I discussed the limitations of evaluation and management by telemedicine. The patient expressed understanding and agreed to proceed.  Subjective:   Felicia Poole is a 77 y.o. female who presents for Medicare Annual (Subsequent) preventive examination.  Review of Systems    Cardiac Risk Factors include: advanced age (>56mn, >>60women);dyslipidemia;hypertension;sedentary lifestyle     Objective:    Today's Vitals   07/29/22 0947  Weight: 120 lb (54.4 kg)  Height: 4' 9"$  (1.448 m)   Body mass index is 25.97 kg/m.     07/29/2022   10:01 AM 07/28/2021    8:54 AM 06/17/2020   11:49 AM 01/24/2018   10:33 AM 01/24/2017    1:51 PM 10/26/2016    1:49 PM 10/06/2015    9:16 PM  Advanced Directives  Does Patient Have a Medical Advance Directive? No No No No No No No  Would patient like information on creating a medical advance directive?  Yes (MAU/Ambulatory/Procedural Areas - Information given) Yes (MAU/Ambulatory/Procedural Areas - Information given) Yes (MAU/Ambulatory/Procedural Areas - Information given) Yes (ED - Information included in AVS)  No - patient declined information    Current Medications (verified) Outpatient Encounter Medications as of 07/29/2022  Medication Sig   alendronate (FOSAMAX) 70 MG tablet Take 1 tablet (70 mg total) by mouth once a week. With full glass of water and do not recline   atorvastatin (LIPITOR) 80 MG tablet Take 1 tablet (80 mg total) by mouth daily.   FLUoxetine (PROZAC) 20 MG capsule Take 1 capsule (20 mg total) by mouth daily.   fluticasone (FLONASE) 50 MCG/ACT nasal spray SPRAY 2 SPRAYS IN EACH NOSTRIL AT BEDTIME   furosemide (LASIX) 40 MG tablet Take 1 tablet (40 mg total) by mouth as needed. Take at least once a week    Incontinence Supply Disposable (INCONTINENCE BRIEF MEDIUM) MISC 1 each by Does not apply route 2 (two) times daily.   levothyroxine (SYNTHROID) 50 MCG tablet Take 1 tablet (50 mcg total) by mouth daily. To last until follow up   lisinopril (PRINIVIL,ZESTRIL) 20 MG tablet TAKE 1 TABLET (20 MG TOTAL) BY MOUTH DAILY.   sotalol (BETAPACE) 80 MG tablet TAKE 1 TABLET BY MOUTH TWICE DAILY. MUST BE SEEN IN OFFICE FOR FURTHER REFILLS.   Vitamin D, Ergocalciferol, (DRISDOL) 1.25 MG (50000 UNIT) CAPS capsule Take 1 capsule (50,000 Units total) by mouth every 7 (seven) days.   warfarin (COUMADIN) 2.5 MG tablet TAKE TWO TABLET(S) BY MOUTH DAILY AS DIRECTED BY ANTI-COAG CLINIC   No facility-administered encounter medications on file as of 07/29/2022.    Allergies (verified) Patient has no known allergies.   History: Past Medical History:  Diagnosis Date   Atrial myxoma    [facility]   Benign neoplasm of heart    Chronic HFrEF (heart failure with reduced ejection fraction) ([facility]    a. EF: 45% in 2012; b. 01/2013 Echo: EF 45-50%;  c. 09/2019 Echo: EF 35-40%.   Contact dermatitis and other eczema, due to unspecified cause    Contusion of unspecified site    Coronary atherosclerosis of unspecified type of vessel, native or graft    a. 08/2010 s/p CABG x 1 (LIMA->LAD @ time of atrial myxoma resection ([facility]); b. 01/2013 MV: No ischemia/infarct. EF 71%.  Cramp of limb    Depression    Essential hypertension, benign    Heartburn    Hyperlipidemia LDL goal <70    Lumbago    Nonischemic cardiomyopathy ([place])    a. EF: 45% in 2012; b. 01/2013 Echo: EF 45-50%, mild diff HK. Mild to mod MR. Mild BAE. Mod TR; c. 09/2019 Echo: EF 35-40%, glob HK, mod enlarged RV w/ nl fxn, RVSP 45mHg, mild-mod LAE, mild-mod MR, sev TR.   Osteoporosis, unspecified    Other specified cardiac dysrhythmias(427.89)    PAF (paroxysmal atrial fibrillation) (HCC)    a. CHA2DS2VASc = 6-->warfarin.  Rhythm management with  sotalol.   Postsurgical percutaneous transluminal coronary angioplasty status    Tachy-brady syndrome ([facility]    a. 02/2013 s/p MDT [id number] DR MRI DC PPM.   Type II or unspecified type diabetes mellitus without mention of complication, not stated as uncontrolled    Unspecified hypothyroidism    Unspecified menopausal and postmenopausal disorder    Past Surgical History:  Procedure Laterality Date   ATRIAL MYXOMA EXCISION Left    CARDIAC CATHETERIZATION  2012   [facility]; right and left heart 75% ostial LAD lesion   CORONARY ARTERY BYPASS GRAFT  2011   [facility]   INSERT / REPLACE / REMOVE PACEMAKER  02/2013   Dual chamber MDT advisa pacemaker. [id number]   VAGINAL HYSTERECTOMY     Family History  Problem Relation Age of Onset   Heart failure Mother    Cirrhosis Mother    Hyperlipidemia Father    Hypertension Father    Stroke Father    Heart attack Father    Social History   Socioeconomic History   Marital status: Widowed    Spouse name: AGwenlyn Perking  Number of children: 5   Years of education: Not on file   Highest education level: 9th grade  Occupational History    Employer: DISABLED  Tobacco Use   Smoking status: Never   Smokeless tobacco: Never   Tobacco comments:    smoking cessation materials not required  Vaping Use   Vaping Use: Never used  Substance and Sexual Activity   Alcohol use: No   Drug use: No   Sexual activity: Never  Other Topics Concern   Not on file  Social History Narrative   Pt lives with son, daughter LVaughan Poole son in lSports coach granddaughter   Social Determinants of Health   Financial Resource Strain: Low Risk  (07/29/2022)   Overall Financial Resource Strain (CARDIA)    Difficulty of Paying Living Expenses: Not hard at all  Food Insecurity: No Food Insecurity (07/29/2022)   Hunger Vital Sign    Worried About Running Out of Food in the Last Year: Never true    RLeuppin the Last Year: Never true  Transportation Needs: No Transportation Needs  (07/29/2022)   PRAPARE - THydrologist(Medical): No    Lack of Transportation (Non-Medical): No  Physical Activity: Inactive (07/29/2022)   Exercise Vital Sign    Days of Exercise per Week: 0 days    Minutes of Exercise per Session: 0 min  Stress: No Stress Concern Present (07/29/2022)   [place]   Feeling of Stress : Only a little  Social Connections: Moderately Isolated (07/29/2022)   Social Connection and Isolation Panel [NHANES]    Frequency of Communication with Friends and Family: More than three times a week  Frequency of Social Gatherings with Friends and Family: More than three times a week    Attends Religious Services: More than 4 times per year    Active Member of Clubs or Organizations: No    Attends Archivist Meetings: Never    Marital Status: Widowed    Tobacco Counseling Counseling given: Not Answered Tobacco comments: smoking cessation materials not required   Clinical Intake:  Pre-visit preparation completed: Yes  Pain : No/denies pain     Nutritional Risks: None Diabetes: No  How often do you need to have someone help you when you read instructions, pamphlets, or other written materials from your doctor or pharmacy?: 1 - Never  Diabetic?no  Interpreter Needed?: No  Information entered by :: B.Kiesha Ensey,LPN   Activities of Daily Living    07/29/2022   10:01 AM 06/29/2022    1:32 PM  In your present state of health, do you have any difficulty performing the following activities:  Hearing? 0 0  Vision? 0 0  Difficulty concentrating or making decisions? 1 1  Walking or climbing stairs? 0 1  Dressing or bathing? 0 0  Doing errands, shopping? 0 0  Preparing Food and eating ? N   Using the Toilet? N   In the past six months, have you accidently leaked urine? Y   Do you have problems with loss of bowel control? N   Managing your Medications? Y    Comment daughter does it   Managing your Finances? Y   Housekeeping or managing your Housekeeping? Y     Patient Care Team: Steele Sizer, MD as PCP - General (Family Medicine) Deboraha Sprang, MD as Consulting Physician (Cardiology) Germaine Pomfret, [facility] (Pharmacist) Minna Merritts, MD as Consulting Physician (Cardiology)  Indicate any recent Medical Services you may have received from other than [facility] providers in the past year (date may be approximate).     Assessment:   This is a routine wellness examination for [facility].  Hearing/Vision screen Hearing Screening - Comments:: Adequate hearing Vision Screening - Comments:: Adequate vision-needs eye doctor  Dietary issues and exercise activities discussed: Current Exercise Habits: The patient does not participate in regular exercise at present, Exercise limited by: orthopedic condition(s)   Goals Addressed             This Visit's Progress    Cut out extra servings   On track    Recommend cutting out extra servings and snacking in between meals.        Depression Screen    07/29/2022    9:57 AM 06/29/2022    1:32 PM 07/28/2021    8:53 AM 07/21/2021   10:59 AM 12/22/2020    9:14 AM 06/17/2020   11:47 AM 04/10/2020   10:05 AM  PHQ 2/9 Scores  PHQ - 2 Score 4 4 1 1 2 2 2  $ PHQ- 9 Score 10 12 1 1 5 3 6    $ Fall Risk    07/29/2022    9:51 AM 06/29/2022    1:31 PM 07/28/2021    8:55 AM 07/21/2021   10:58 AM 12/22/2020    9:14 AM  Fall Risk   Falls in the past year? 0 0 0 0 0  Number falls in past yr: 0  0 0 0  Injury with Fall? 0  0 0 0  Risk for fall due to : No Fall Risks No Fall Risks No Fall Risks No Fall Risks   Follow up Education  provided;Falls prevention discussed Falls prevention discussed;Education provided;Falls evaluation completed Falls prevention discussed      FALL RISK PREVENTION PERTAINING TO THE HOME:  Any stairs in or around the home? Yes  If so, are there any without handrails? Yes  Home  free of loose throw rugs in walkways, pet beds, electrical cords, etc? Yes  Adequate lighting in your home to reduce risk of falls? Yes   ASSISTIVE DEVICES UTILIZED TO PREVENT FALLS:  Life alert? No  Use of a cane, walker or w/c? No  Grab bars in the bathroom? No  Shower chair or bench in shower? No  Elevated toilet seat or a handicapped toilet? No     Cognitive Function:        07/29/2022   10:03 AM 06/17/2020   11:54 AM 01/24/2018   10:35 AM  6CIT Screen  What Year? 0 points 4 points 0 points  What month? 0 points 0 points 0 points  What time? 0 points 0 points 0 points  Count back from 20 2 points 2 points 2 points  Months in reverse 4 points 4 points 4 points  Repeat phrase 10 points 8 points 8 points  Total Score 16 points 18 points 14 points    Immunizations Immunization History  Administered Date(s) Administered   Fluad Quad(high Dose 65+) 02/05/2019   Influenza, High Dose Seasonal PF 04/29/2015   Pneumococcal Conjugate-13 04/29/2015   Pneumococcal Polysaccharide-23 02/17/2011   Tdap 04/28/2012    TDAP status: Up to date  Flu Vaccine status: Declined, Education has been provided regarding the importance of this vaccine but patient still declined. Advised may receive this vaccine at local pharmacy or Health Dept. Aware to provide a copy of the vaccination record if obtained from local pharmacy or Health Dept. Verbalized acceptance and understanding.  Pneumococcal vaccine status: Up to date  Covid-19 vaccine status: Completed vaccines  Qualifies for Shingles Vaccine? Yes   Zostavax completed No   Shingrix Completed?: No.    Education has been provided regarding the importance of this vaccine. Patient has been advised to call insurance company to determine out of pocket expense if they have not yet received this vaccine. Advised may also receive vaccine at local pharmacy or Health Dept. Verbalized acceptance and understanding.  Screening Tests Health Maintenance   Topic Date Due   MAMMOGRAM  Never done   INFLUENZA VACCINE  09/05/2022 (Originally 01/05/2022)   Zoster Vaccines- Shingrix (1 of 2) 09/27/2022 (Originally 03/15/1965)   Medicare Annual Wellness (AWV)  07/30/2023   Pneumonia Vaccine 71+ Years old  Completed   DEXA SCAN  Completed   Hepatitis C Screening  Completed   HPV VACCINES  Aged Out   DTaP/Tdap/Td  Discontinued   COLONOSCOPY (Pts 45-85yr Insurance coverage will need to be confirmed)  Discontinued   COVID-19 Vaccine  Discontinued    Health Maintenance  Health Maintenance Due  Topic Date Due   MAMMOGRAM  Never done    Colorectal cancer screening: No longer required.   Mammogram status: No longer required due to age.  Bone Density status: Completed yes. Results reflect: Bone density results: OSTEOPOROSIS. Repeat every 5 years.  Lung Cancer Screening: (Low Dose CT Chest recommended if Age 78-80years, 30 pack-year currently smoking OR have quit w/in 15years.) does not qualify.   Lung Cancer Screening Referral: no  Additional Screening:  Hepatitis C Screening: does not qualify; Completed no  Vision Screening: Recommended annual ophthalmology exams for early detection of glaucoma and other disorders of  the eye. Is the patient up to date with their annual eye exam?  No  Who is the provider or what is the name of the office in which the patient attends annual eye exams? Pt needs eye If pt is not established with a provider, would they like to be referred to a provider to establish care? Yes .   Dental Screening: Recommended annual dental exams for proper oral hygiene  Community Resource Referral / Chronic Care Management: CRR required this visit?  No   CCM required this visit?  No      Plan:     I have personally reviewed and noted the following in the patient's chart:   Medical and social history Use of alcohol, tobacco or illicit drugs  Current medications and supplements including opioid prescriptions. Patient  is not currently taking opioid prescriptions. Functional ability and status Nutritional status Physical activity Advanced directives List of other physicians Hospitalizations, surgeries, and ER visits in previous 12 months Vitals Screenings to include cognitive, depression, and falls Referrals and appointments  In addition, I have reviewed and discussed with patient certain preventive protocols, quality metrics, and best practice recommendations. A written personalized care plan for preventive services as well as general preventive health recommendations were provided to patient.     Roger Shelter, LPN   [id number]   Nurse Notes: visit done with assistance of pt's daughter, Felicia Poole. Pt sts she is sleeping better with the medication they gave her. Pt's daughter manages most of her care (medications, finances,etc). She does relay she is out of Lasix and Levothyroxine (for a few weeks)..not entirely sure exact time off of medication. Pt wants referral to eye MD. Refills tee'd for PCP.

## 2022-07-29 NOTE — Patient Instructions (Signed)
Felicia Poole , Thank you for taking time to come for your Medicare Wellness Visit. I appreciate your ongoing commitment to your health goals. Please review the following plan we discussed and let me know if I can assist you in the future.   These are the goals we discussed:  Goals      Cut out extra servings     Recommend cutting out extra servings and snacking in between meals.      DIET - INCREASE WATER INTAKE     Recommend to drink at least 6-8 8oz glasses of water per day.        This is a list of the screening recommended for you and due dates:  Health Maintenance  Topic Date Due   Mammogram  Never done   Flu Shot  09/05/2022*   Zoster (Shingles) Vaccine (1 of 2) 09/27/2022*   Medicare Annual Wellness Visit  07/30/2023   Pneumonia Vaccine  Completed   DEXA scan (bone density measurement)  Completed   Hepatitis C Screening: USPSTF Recommendation to screen - Ages 72-79 yo.  Completed   HPV Vaccine  Aged Out   DTaP/Tdap/Td vaccine  Discontinued   Colon Cancer Screening  Discontinued   COVID-19 Vaccine  Discontinued  *Topic was postponed. The date shown is not the original due date.    Advanced directives: no  Conditions/risks identified: none  Next appointment: Follow up in one year for your annual wellness visit 08/04/2023@ 9:15am telephone   Preventive Care 65 Years and Older, Female Preventive care refers to lifestyle choices and visits with your health care provider that can promote health and wellness. What does preventive care include? A yearly physical exam. This is also called an annual well check. Dental exams once or twice a year. Routine eye exams. Ask your health care provider how often you should have your eyes checked. Personal lifestyle choices, including: Daily care of your teeth and gums. Regular physical activity. Eating a healthy diet. Avoiding tobacco and drug use. Limiting alcohol use. Practicing safe sex. Taking low-dose aspirin every  day. Taking vitamin and mineral supplements as recommended by your health care provider. What happens during an annual well check? The services and screenings done by your health care provider during your annual well check will depend on your age, overall health, lifestyle risk factors, and family history of disease. Counseling  Your health care provider may ask you questions about your: Alcohol use. Tobacco use. Drug use. Emotional well-being. Home and relationship well-being. Sexual activity. Eating habits. History of falls. Memory and ability to understand (cognition). Work and work Statistician. Reproductive health. Screening  You may have the following tests or measurements: Height, weight, and BMI. Blood pressure. Lipid and cholesterol levels. These may be checked every 5 years, or more frequently if you are over 22 years old. Skin check. Lung cancer screening. You may have this screening every year starting at age 22 if you have a 30-pack-year history of smoking and currently smoke or have quit within the past 15 years. Fecal occult blood test (FOBT) of the stool. You may have this test every year starting at age 20. Flexible sigmoidoscopy or colonoscopy. You may have a sigmoidoscopy every 5 years or a colonoscopy every 10 years starting at age 12. Hepatitis C blood test. Hepatitis B blood test. Sexually transmitted disease (STD) testing. Diabetes screening. This is done by checking your blood sugar (glucose) after you have not eaten for a while (fasting). You may have this done every  1-3 years. Bone density scan. This is done to screen for osteoporosis. You may have this done starting at age 60. Mammogram. This may be done every 1-2 years. Talk to your health care provider about how often you should have regular mammograms. Talk with your health care provider about your test results, treatment options, and if necessary, the need for more tests. Vaccines  Your health care  provider may recommend certain vaccines, such as: Influenza vaccine. This is recommended every year. Tetanus, diphtheria, and acellular pertussis (Tdap, Td) vaccine. You may need a Td booster every 10 years. Zoster vaccine. You may need this after age 79. Pneumococcal 13-valent conjugate (PCV13) vaccine. One dose is recommended after age 77. Pneumococcal polysaccharide (PPSV23) vaccine. One dose is recommended after age 18. Talk to your health care provider about which screenings and vaccines you need and how often you need them. This information is not intended to replace advice given to you by your health care provider. Make sure you discuss any questions you have with your health care provider. Document Released: 06/20/2015 Document Revised: 02/11/2016 Document Reviewed: 03/25/2015 Elsevier Interactive Patient Education  2017 [place] Prevention in the Home Falls can cause injuries. They can happen to people of all ages. There are many things you can do to make your home safe and to help prevent falls. What can I do on the outside of my home? Regularly fix the edges of walkways and driveways and fix any cracks. Remove anything that might make you trip as you walk through a door, such as a raised step or threshold. Trim any bushes or trees on the path to your home. Use bright outdoor lighting. Clear any walking paths of anything that might make someone trip, such as rocks or tools. Regularly check to see if handrails are loose or broken. Make sure that both sides of any steps have handrails. Any raised decks and porches should have guardrails on the edges. Have any leaves, snow, or ice cleared regularly. Use sand or salt on walking paths during winter. Clean up any spills in your garage right away. This includes oil or grease spills. What can I do in the bathroom? Use night lights. Install grab bars by the toilet and in the tub and shower. Do not use towel bars as grab  bars. Use non-skid mats or decals in the tub or shower. If you need to sit down in the shower, use a plastic, non-slip stool. Keep the floor dry. Clean up any water that spills on the floor as soon as it happens. Remove soap buildup in the tub or shower regularly. Attach bath mats securely with double-sided non-slip rug tape. Do not have throw rugs and other things on the floor that can make you trip. What can I do in the bedroom? Use night lights. Make sure that you have a light by your bed that is easy to reach. Do not use any sheets or blankets that are too big for your bed. They should not hang down onto the floor. Have a firm chair that has side arms. You can use this for support while you get dressed. Do not have throw rugs and other things on the floor that can make you trip. What can I do in the kitchen? Clean up any spills right away. Avoid walking on wet floors. Keep items that you use a lot in easy-to-reach places. If you need to reach something above you, use a strong step stool that has a  grab bar. Keep electrical cords out of the way. Do not use floor polish or wax that makes floors slippery. If you must use wax, use non-skid floor wax. Do not have throw rugs and other things on the floor that can make you trip. What can I do with my stairs? Do not leave any items on the stairs. Make sure that there are handrails on both sides of the stairs and use them. Fix handrails that are broken or loose. Make sure that handrails are as long as the stairways. Check any carpeting to make sure that it is firmly attached to the stairs. Fix any carpet that is loose or worn. Avoid having throw rugs at the top or bottom of the stairs. If you do have throw rugs, attach them to the floor with carpet tape. Make sure that you have a light switch at the top of the stairs and the bottom of the stairs. If you do not have them, ask someone to add them for you. What else can I do to help prevent  falls? Wear shoes that: Do not have high heels. Have rubber bottoms. Are comfortable and fit you well. Are closed at the toe. Do not wear sandals. If you use a stepladder: Make sure that it is fully opened. Do not climb a closed stepladder. Make sure that both sides of the stepladder are locked into place. Ask someone to hold it for you, if possible. Clearly mark and make sure that you can see: Any grab bars or handrails. First and last steps. Where the edge of each step is. Use tools that help you move around (mobility aids) if they are needed. These include: Canes. Walkers. Scooters. Crutches. Turn on the lights when you go into a dark area. Replace any light bulbs as soon as they burn out. Set up your furniture so you have a clear path. Avoid moving your furniture around. If any of your floors are uneven, fix them. If there are any pets around you, be aware of where they are. Review your medicines with your doctor. Some medicines can make you feel dizzy. This can increase your chance of falling. Ask your doctor what other things that you can do to help prevent falls. This information is not intended to replace advice given to you by your health care provider. Make sure you discuss any questions you have with your health care provider. Document Released: 03/20/2009 Document Revised: 10/30/2015 Document Reviewed: 06/28/2014 Elsevier Interactive Patient Education  2017 [employer].

## 2022-08-18 ENCOUNTER — Ambulatory Visit: Payer: 59

## 2022-09-01 ENCOUNTER — Ambulatory Visit: Payer: 59 | Attending: Internal Medicine | Admitting: Pharmacist Clinician (PhC)/ Clinical Pharmacy Specialist

## 2022-09-01 DIAGNOSIS — I4891 Unspecified atrial fibrillation: Secondary | ICD-10-CM

## 2022-09-01 DIAGNOSIS — Z5181 Encounter for therapeutic drug level monitoring: Secondary | ICD-10-CM | POA: Diagnosis not present

## 2022-09-01 LAB — POCT INR: INR: 3.3 — AB (ref 2.0–3.0)

## 2022-09-01 NOTE — Patient Instructions (Signed)
TAKE 1 TABLET TONIGHT ONLY and then continue 2 tablets every day. Recheck in 3 weeks.

## 2022-09-22 ENCOUNTER — Ambulatory Visit: Payer: 59 | Attending: Internal Medicine | Admitting: *Deleted

## 2022-09-22 DIAGNOSIS — Z5181 Encounter for therapeutic drug level monitoring: Secondary | ICD-10-CM

## 2022-09-22 DIAGNOSIS — I4891 Unspecified atrial fibrillation: Secondary | ICD-10-CM

## 2022-09-22 LAB — POCT INR: INR: 1.4 — AB (ref 2.0–3.0)

## 2022-09-22 NOTE — Patient Instructions (Signed)
Take 3 tablets tonight and tomorrow night then continue 2 tablets every day. Recheck in 2 weeks.

## 2022-09-28 ENCOUNTER — Ambulatory Visit: Payer: 59 | Admitting: Family Medicine

## 2022-09-28 NOTE — Progress Notes (Deleted)
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 She has been taking furosemide but out of potassium tablets ( at least not on her bag)   Senile purpura: on both arms, stable. Unchanged   MDD: she has a long history of depression, but worse she became a widow about 10 years ago. She was zoloft but she stopped because it caused headaches, we gave her prozac 10mg  and states more stressed lately and would like to go up on dose to 20 mg dose .She is sleeping okay   Atrial fibrillation: has pacemaker for tachycardia-bradycardia syndrome, goes to coumadin clinic, sees Dr. Graciela Husbands, denies bleeding but INR today was very high at over 5 and medication was held. Denies palpitation   CHF systolic: she is on lasix, ace inhibitor ( but not on her medication bag)  She denies orthopnea, but no lower extremity edema. Daughter states some wheezing and SOB with activity intermittently that has been stable, reminded her to follow up with cardiologist   Hypothyroidism: last TSH one year ago was elevated, she is currently taking levothyroxine 50 mcg daily and we will recheck level, she states compliant with medication. She denies hair loss or dry skin, no change in bowel movement   GERD: she states doing better, denies  heartburn or indigestion  OA: deformities on both hands on fingers , denies joint pain on lower extremities , discussed tylenol only prn   CKI : we will recheck labs, denies pruritus, good urine output, she is not sure if taking ACE. We will recheck labs   HTN: does not seem to be taking any of her medications for bp at this time. We had discussed upstream to increase compliance but she is still using Psychologist, forensic.   Incontinence: chronic, she uses depends twice daily, she has urge incontinence and some times overflow incontinence. It is medically necessary for her to wear depends   Patient Active Problem List   Diagnosis Date Noted   Major depression, recurrent, chronic 04/10/2020   Stage 3a chronic kidney disease 04/10/2020   Age-related osteoporosis without current pathological fracture 04/10/2020   Thrombocytopenia 04/10/2020   Angina pectoris 04/11/2017   Senile purpura 04/29/2015   Chronic anticoagulation 04/29/2015   Obesity 12/10/2014   Hypercholesterolemia 10/15/2013   Myoma 10/15/2013   Encounter for therapeutic drug monitoring 08/08/2013   Pacemaker -Medtronic 05/24/2013   Nonischemic cardiomyopathy    Atrial fibrillation 02/28/2013   Arteriosclerosis of coronary artery 02/22/2013   Adult hypothyroidism 02/22/2013   Congestive heart failure with left ventricular systolic dysfunction 02/22/2013   Tachycardia-bradycardia 02/22/2013   Essential hypertension, benign    Atrial myxoma    Coronary atherosclerosis     Past Surgical History:  Procedure Laterality Date   ATRIAL MYXOMA EXCISION Left    CARDIAC CATHETERIZATION  2012  [facility]; right and left heart 75% ostial LAD lesion   CORONARY ARTERY BYPASS GRAFT  2011   [facility]   INSERT / REPLACE / REMOVE PACEMAKER  02/2013   Dual chamber MDT advisa pacemaker. MVP-R70   VAGINAL HYSTERECTOMY      Family History  Problem Relation Age of Onset   Heart failure Mother    Cirrhosis Mother    Hyperlipidemia Father     Hypertension Father    Stroke Father    Heart attack Father     Social History   Tobacco Use   Smoking status: Never   Smokeless tobacco: Never   Tobacco comments:    smoking cessation materials not required  Substance Use Topics   Alcohol use: No     Current Outpatient Medications:    alendronate (FOSAMAX) 70 MG tablet, Take 1 tablet (70 mg total) by mouth once a week. With full glass of water and do not recline, Disp: 12 tablet, Rfl: 1   atorvastatin (LIPITOR) 80 MG tablet, Take 1 tablet (80 mg total) by mouth daily., Disp: 90 tablet, Rfl: 1   FLUoxetine (PROZAC) 20 MG capsule, Take 1 capsule (20 mg total) by mouth daily., Disp: 90 capsule, Rfl: 1   fluticasone (FLONASE) 50 MCG/ACT nasal spray, SPRAY 2 SPRAYS IN EACH NOSTRIL AT BEDTIME, Disp: 48 mL, Rfl: 1   furosemide (LASIX) 40 MG tablet, Take 1 tablet (40 mg total) by mouth as needed. Take at least once a week, Disp: 90 tablet, Rfl: 1   Incontinence Supply Disposable (INCONTINENCE BRIEF MEDIUM) MISC, 1 each by Does not apply route 2 (two) times daily., Disp: 100 each, Rfl: 5   levothyroxine (SYNTHROID) 50 MCG tablet, Take 1 tablet (50 mcg total) by mouth daily. To last until follow up, Disp: 90 tablet, Rfl: 0   lisinopril (PRINIVIL,ZESTRIL) 20 MG tablet, TAKE 1 TABLET (20 MG TOTAL) BY MOUTH DAILY., Disp: 90 tablet, Rfl: 3   sotalol (BETAPACE) 80 MG tablet, TAKE 1 TABLET BY MOUTH TWICE DAILY. MUST BE SEEN IN OFFICE FOR FURTHER REFILLS., Disp: 60 tablet, Rfl: 3   Vitamin D, Ergocalciferol, (DRISDOL) 1.25 MG (50000 UNIT) CAPS capsule, Take 1 capsule (50,000 Units total) by mouth every 7 (seven) days., Disp: 12 capsule, Rfl: 3   warfarin (COUMADIN) 2.5 MG tablet, TAKE TWO TABLET(S) BY MOUTH DAILY AS DIRECTED BY ANTI-COAG CLINIC, Disp: 180 tablet, Rfl: 1  No Known Allergies  I personally reviewed active problem list, medication list, allergies, family history, social history, health maintenance with the patient/caregiver  today.   ROS  ***  Objective  There were no vitals filed for this visit.  There is no height or weight on file to calculate BMI.  Physical Exam ***  Recent Results (from the past 2160 hour(s))  POCT INR     Status: Abnormal   Collection Time: 07/07/22  1:27 PM  Result Value Ref Range   INR 3.7 (A) 2.0 - 3.0   POC INR    POCT INR     Status: Abnormal   Collection Time: 07/28/22  1:11 PM  Result Value Ref Range   INR 1.6 (A) 2.0 - 3.0   POC INR    POCT INR     Status: Abnormal   Collection Time: 09/01/22  2:56 PM  Result Value Ref Range   INR 3.3 (A) 2.0 - 3.0   POC INR    POCT INR     Status: Abnormal   Collection Time: 09/22/22  2:59 PM  Result Value Ref Range   INR 1.4 (A) 2.0 - 3.0   POC INR      PHQ2/9:    07/29/2022    9:57 AM 06/29/2022    1:32 PM 07/28/2021    8:53 AM 07/21/2021   10:59 AM 12/22/2020    9:14 AM  Depression screen PHQ 2/9  Decreased Interest 2 2 0 0 0  Down, Depressed, Hopeless PHQ - 2 Score Altered sleeping 1 1 0 0 1  Tired, decreased energy 1 1 0 0 2  Change in appetite 1 1 0 0 0  Feeling bad or failure about yourself  1 1 0 0 0  Trouble concentrating 1 3 0 0 0  Moving slowly or fidgety/restless 1 1 0 0 0  Suicidal thoughts 0 0 0 0 0  PHQ-9 Score Difficult doing work/chores Somewhat difficult Somewhat difficult Not difficult at all Not difficult at all Somewhat difficult    phq 9 is {gen pos [id number]}   Fall Risk:    07/29/2022    9:51 AM 06/29/2022    1:31 PM 07/28/2021    8:55 AM 07/21/2021   10:58 AM 12/22/2020    9:14 AM  Fall Risk   Falls in the past year? 0 0 0 0 0  Number falls in past yr: 0  0 0 0  Injury with Fall? 0  0 0 0  Risk for fall due to : No Fall Risks No Fall Risks No Fall Risks No Fall Risks   Follow up Education provided;Falls prevention discussed Falls prevention discussed;Education provided;Falls evaluation completed Falls prevention discussed        Functional  Status Survey:      Assessment & Plan  *** There are no diagnoses linked to this encounter.

## 2022-09-29 ENCOUNTER — Ambulatory Visit: Payer: 59 | Admitting: Family Medicine

## 2022-10-04 ENCOUNTER — Other Ambulatory Visit: Payer: Self-pay | Admitting: Family Medicine

## 2022-10-04 ENCOUNTER — Telehealth: Payer: Self-pay | Admitting: *Deleted

## 2022-10-04 DIAGNOSIS — E039 Hypothyroidism, unspecified: Secondary | ICD-10-CM

## 2022-10-04 NOTE — Telephone Encounter (Signed)
  Chief Complaint: Medication Symptoms: NA Frequency: NA Pertinent Negatives: Patient denies NA Disposition: [] ED /[] Urgent Care (no appt availability in office) / [] Appointment(In office/virtual)/ []  [place] [facility]/ [] Home Care/ [] Refused Recommended Disposition /[]  Mobile Bus/ [x]  Follow-up with PCP Additional Notes:  Pt's daughter calling, pt present. States pt has been out of levothyroxine for 3 weeks "Maybe longer like a month or so." Refill had been sent 07/29/22, #90, states never picked it up from pharmacy. Pt had OV and labs drawn 06/29/22, appears practice had been trying to reach regarding TSH, med. Pt cancelled appts 09/28/22 and 09/29/22. Advised may need appt. before refill. Requesting refill of levothyroxine. States pt choking with meals at times, "Not all the time."  Please advise, need appt? Can pt have refill

## 2022-10-05 ENCOUNTER — Telehealth: Payer: Self-pay

## 2022-10-05 ENCOUNTER — Other Ambulatory Visit: Payer: Self-pay | Admitting: Family Medicine

## 2022-10-05 DIAGNOSIS — E039 Hypothyroidism, unspecified: Secondary | ICD-10-CM

## 2022-10-05 NOTE — Telephone Encounter (Signed)
Daughter has scheduled for 5.7.2024. States her mom is completely out of her thyroid medication and wanted to know if your able to give her enough to last until then. Stated that when her mom eats she seems like she gets choked.  Pt was not able to come any sooner due to transportation

## 2022-10-05 NOTE — Telephone Encounter (Signed)
Spoke with Bonita Quin and let her know Pharmacist stated medication was picked up on April 1st for a 90-day supply. She stated she would look at the home for the medication as it must have just been overlooked by patient. She will call us back if she cannot locate it.      Copied from CRM [id number]. Topic: General - Other >> Oct 05, 2022  1:39 PM Everette C wrote: Reason for CRM: The patient has called to request completion of a prior authorization for their levothyroxine (SYNTHROID) 50 MCG tablet [[id number]]  Please contact the patient further if needed

## 2022-10-05 NOTE — Telephone Encounter (Signed)
I contacted daughter and she stated they never picked it up in February and it was on hold at pharmacy. She sounded confused so I called and spoke with Pharmacist and she stated they did have one on hold from February but that the patient had picked it up April 1st for 90 days. I tried to reach out to the daughter again to see if it was misplaced, ect. Left voicemail and awaiting return call.

## 2022-10-05 NOTE — Telephone Encounter (Signed)
Left voice mail for daughter.

## 2022-10-05 NOTE — Telephone Encounter (Signed)
I contacted daughter and she stated they never picked it up levothyroxine in February and it was on hold at pharmacy. She sounded confused so I called and spoke with Pharmacist and she stated they did have one on hold from February but that the patient had picked it up April 1st for 90 days. I tried to reach out to the daughter again to see if it was misplaced, ect. Left voicemail and awaiting return call.

## 2022-10-06 ENCOUNTER — Ambulatory Visit: Payer: 59 | Attending: Internal Medicine

## 2022-10-08 ENCOUNTER — Other Ambulatory Visit: Payer: Self-pay | Admitting: Family Medicine

## 2022-10-08 ENCOUNTER — Ambulatory Visit: Payer: Self-pay

## 2022-10-08 DIAGNOSIS — E039 Hypothyroidism, unspecified: Secondary | ICD-10-CM

## 2022-10-08 DIAGNOSIS — J302 Other seasonal allergic rhinitis: Secondary | ICD-10-CM

## 2022-10-08 NOTE — Telephone Encounter (Signed)
  Chief Complaint: medication assistance Symptoms: NA Frequency: today Pertinent Negatives: NA Disposition: [] ED /[] Urgent Care (no appt availability in office) / [] Appointment(In office/virtual)/ []  [place] [facility]/ [x] Home Care/ [] Refused Recommended Disposition /[] [place] [facility]/ []  Follow-up with PCP Additional Notes: [facility] called back, spoke with Duwayne Heck, Rx Tech. She was stating that Furosemide 40mg  wasn't on pt's med profile so wanting to know if pt still taking. Advised that Cardioogy prescribed and per OV notes on 06/29/22, pt was taking EOD. No further assistance needed.   Summary: med clarification for pharmacy   Danielle Pharmacy Tech from [facility] is asking if pt is currently taking the medication furosemide (LASIX) 40 MG tablet.  Ticket Number - [id number]  Seeking clinical advice.         Reason for Disposition  Pharmacy calling with prescription question and triager answers question  Answer Assessment - Initial Assessment Questions 1. NAME of MEDICINE: "What medicine(s) are you calling about?"     Furosemide 40mg  2. QUESTION: "What is your question?" (e.g., double dose of medicine, side effect)     Wanting to know if pt is still taking medication 3. PRESCRIBER: "Who prescribed the medicine?" Reason: if prescribed by specialist, call should be referred to that group.     Cardiology  Protocols used: Medication Question Call-A-AH

## 2022-10-12 ENCOUNTER — Ambulatory Visit: Payer: 59 | Admitting: Family Medicine

## 2022-10-14 ENCOUNTER — Other Ambulatory Visit: Payer: Self-pay | Admitting: Family Medicine

## 2022-10-30 ENCOUNTER — Other Ambulatory Visit: Payer: Self-pay | Admitting: Family Medicine

## 2022-11-10 ENCOUNTER — Other Ambulatory Visit: Payer: Self-pay | Admitting: Cardiovascular Disease

## 2022-11-10 DIAGNOSIS — I4891 Unspecified atrial fibrillation: Secondary | ICD-10-CM

## 2022-11-11 NOTE — Telephone Encounter (Signed)
Left a message on answering machine to contact our office regarding a refill on sotalol.

## 2022-11-11 NOTE — Telephone Encounter (Signed)
Prescription refill request received for warfarin Lov: 12/02/21 Felicia Poole)  Next INR check: 10/06/22 (overdue)  Warfarin tablet strength: 2.5mg   Anticoagulation visit overdue. Pt has scheduled appt on 11/24/22. Refill sent to prevent any missed doses.

## 2022-11-12 ENCOUNTER — Telehealth: Payer: Self-pay | Admitting: Cardiovascular Disease

## 2022-11-12 NOTE — Telephone Encounter (Signed)
*  STAT* If patient is at the pharmacy, call can be transferred to refill team.   1. Which medications need to be refilled? (please list name of each medication and dose if known) sotalol (BETAPACE) 80 MG tablet   2. Which pharmacy/location (including street and city if local pharmacy) is medication to be sent to?  [facility] - [place], [place] - [address]    3. Do they need a 30 day or 90 day supply? 30

## 2022-11-12 NOTE — Telephone Encounter (Signed)
Please contact pt for future appointment. Pt hasn't been seen by general cardiology since 2022. Pt needing refills.

## 2022-11-16 NOTE — Telephone Encounter (Signed)
30 day supply sent to pharmacy requesting pt contact office (1st attempt).

## 2022-11-16 NOTE — Progress Notes (Deleted)
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 She has been taking furosemide but out of potassium tablets ( at least not on her bag)   Senile purpura: on both arms, stable. Unchanged   MDD: she has a long history of depression, but worse she became a widow about 10 years ago. She was zoloft but she stopped because it caused headaches, we gave her prozac 10mg  and states more stressed lately and would like to go up on dose to 20 mg dose .She is sleeping okay   Atrial fibrillation: has pacemaker for tachycardia-bradycardia syndrome, goes to coumadin clinic, sees Dr. Graciela Husbands, denies bleeding but INR today was very high at over 5 and medication was held. Denies palpitation   CHF systolic: she is on lasix, ace inhibitor ( but not on her medication bag)  She denies orthopnea, but no lower extremity edema. Daughter states some wheezing and SOB with activity intermittently that has been stable, reminded her to follow up with cardiologist   Hypothyroidism: last TSH one year ago was elevated, she is currently taking levothyroxine 50 mcg daily and we will recheck level, she states compliant with medication. She denies hair loss or dry skin, no change in bowel movement   GERD: she states doing better, denies  heartburn or indigestion  OA: deformities on both hands on fingers , denies joint pain on lower extremities , discussed tylenol only prn   CKI : we will recheck labs, denies pruritus, good urine output, she is not sure if taking ACE. We will recheck labs   HTN: does not seem to be taking any of her medications for bp at this time. We had discussed upstream to increase compliance but she is still using Psychologist, forensic.   Incontinence: chronic, she uses depends twice daily, she has urge incontinence and some times overflow incontinence. It is medically necessary for her to wear depends   Patient Active Problem List   Diagnosis Date Noted   Major depression, recurrent, chronic (HCC) 04/10/2020   Stage 3a chronic kidney disease (HCC) 04/10/2020   Age-related osteoporosis without current pathological fracture 04/10/2020   Thrombocytopenia (HCC) 04/10/2020   Angina pectoris (HCC) 04/11/2017   Senile purpura (HCC) 04/29/2015   Chronic anticoagulation 04/29/2015   Obesity 12/10/2014   Hypercholesterolemia 10/15/2013   Myoma 10/15/2013   Encounter for therapeutic drug monitoring 08/08/2013   Pacemaker -Medtronic 05/24/2013   Nonischemic cardiomyopathy (HCC)    Atrial fibrillation (HCC) 02/28/2013   Arteriosclerosis of coronary artery 02/22/2013   Adult hypothyroidism 02/22/2013   Congestive heart failure with left ventricular systolic dysfunction (HCC) 02/22/2013   Tachycardia-bradycardia (HCC) 02/22/2013   Essential hypertension, benign    Atrial myxoma    Coronary atherosclerosis     Past Surgical History:  Procedure Laterality Date   ATRIAL MYXOMA  EXCISION Left    CARDIAC CATHETERIZATION  2012   Unc; right and left heart 75% ostial LAD lesion   CORONARY ARTERY BYPASS GRAFT  2011   [facility]   INSERT / REPLACE / REMOVE PACEMAKER  02/2013   Dual chamber MDT advisa pacemaker. MVP-R70   VAGINAL HYSTERECTOMY      Family History  Problem Relation Age of Onset   Heart failure Mother     Cirrhosis Mother    Hyperlipidemia Father    Hypertension Father    Stroke Father    Heart attack Father     Social History   Tobacco Use   Smoking status: Never   Smokeless tobacco: Never   Tobacco comments:    smoking cessation materials not required  Substance Use Topics   Alcohol use: No     Current Outpatient Medications:    alendronate (FOSAMAX) 70 MG tablet, Take 1 tablet (70 mg total) by mouth once a week. With full glass of water and do not recline, Disp: 12 tablet, Rfl: 1   atorvastatin (LIPITOR) 80 MG tablet, Take 1 tablet (80 mg total) by mouth daily., Disp: 90 tablet, Rfl: 1   FLUoxetine (PROZAC) 20 MG capsule, Take 1 capsule (20 mg total) by mouth daily., Disp: 90 capsule, Rfl: 1   fluticasone (FLONASE) 50 MCG/ACT nasal spray, instill 2 sprays in each nostril at bedtime, Disp: 48 mL, Rfl: 0   furosemide (LASIX) 40 MG tablet, Take 1 tablet by mouth every other day, Disp: 15 tablet, Rfl: 0   Incontinence Supply Disposable (INCONTINENCE BRIEF MEDIUM) MISC, 1 each by Does not apply route 2 (two) times daily., Disp: 100 each, Rfl: 5   levothyroxine (SYNTHROID) 50 MCG tablet, Take 1 tablet (50 mcg total) by mouth daily. To last until follow up, Disp: 90 tablet, Rfl: 0   lisinopril (PRINIVIL,ZESTRIL) 20 MG tablet, TAKE 1 TABLET (20 MG TOTAL) BY MOUTH DAILY., Disp: 90 tablet, Rfl: 3   sotalol (BETAPACE) 80 MG tablet, Take 1 tablet (80 mg total) by mouth 2 (two) times daily. PLEASE CALL OFFICE TO SCHEDULE FOLLOW UP APPOINTMENT PRIOR TO NEXT REFILL, Disp: 60 tablet, Rfl: 0   Vitamin D, Ergocalciferol, (DRISDOL) 1.25 MG (50000 UNIT) CAPS capsule, Take 1 capsule (50,000 Units total) by mouth every 7 (seven) days., Disp: 12 capsule, Rfl: 3   warfarin (COUMADIN) 2.5 MG tablet, Take 2 tablets daily or as directed by anti-coag clinic - MUST COME TO COUMADIN CLINIC APPT FOR FUTURE REFILLS., Disp: 180 tablet, Rfl: 0  No Known Allergies  I personally reviewed active problem list,  medication list, allergies, family history, social history, health maintenance with the patient/caregiver today.   ROS  ***  Objective  There were no vitals filed for this visit.  There is no height or weight on file to calculate BMI.  Physical Exam ***  Recent Results (from the past 2160 hour(s))  POCT INR     Status: Abnormal   Collection Time: 09/01/22  2:56 PM  Result Value Ref Range   INR 3.3 (A) 2.0 - 3.0   POC INR    POCT INR     Status: Abnormal   Collection Time: 09/22/22  2:59 PM  Result Value Ref Range   INR 1.4 (A) 2.0 - 3.0   POC INR      PHQ2/9:    07/29/2022    9:57 AM 06/29/2022    1:32 PM 07/28/2021    8:53 AM 07/21/2021   10:59 AM 12/22/2020  9:14 AM  Depression screen PHQ 2/9  Decreased Interest 2 2 0 0 0  Down, Depressed, Hopeless 2 2 1 1 2   PHQ - 2 Score 4 4 1 1 2   Altered sleeping 1 1 0 0 1  Tired, decreased energy 1 1 0 0 2  Change in appetite 1 1 0 0 0  Feeling bad or failure about yourself  1 1 0 0 0  Trouble concentrating 1 3 0 0 0  Moving slowly or fidgety/restless 1 1 0 0 0  Suicidal thoughts 0 0 0 0 0  PHQ-9 Score 10 12 1 1 5   Difficult doing work/chores Somewhat difficult Somewhat difficult Not difficult at all Not difficult at all Somewhat difficult    phq 9 is {gen pos [id number]}   Fall Risk:    07/29/2022    9:51 AM 06/29/2022    1:31 PM 07/28/2021    8:55 AM 07/21/2021   10:58 AM 12/22/2020    9:14 AM  Fall Risk   Falls in the past year? 0 0 0 0 0  Number falls in past yr: 0  0 0 0  Injury with Fall? 0  0 0 0  Risk for fall due to : No Fall Risks No Fall Risks No Fall Risks No Fall Risks   Follow up Education provided;Falls prevention discussed Falls prevention discussed;Education provided;Falls evaluation completed Falls prevention discussed        Functional Status Survey:      Assessment & Plan  *** There are no diagnoses linked to this encounter.

## 2022-11-16 NOTE — Telephone Encounter (Signed)
Last office visit 11/2021.  30 day supply sent to pharmacy requesting pt call to schedule appt-1st attempt.  Also message sent to scheduling to contact pt.

## 2022-11-17 ENCOUNTER — Telehealth: Payer: Self-pay | Admitting: Cardiovascular Disease

## 2022-11-17 ENCOUNTER — Ambulatory Visit: Payer: 59 | Admitting: Family Medicine

## 2022-11-17 NOTE — Telephone Encounter (Signed)
Left voice mail to schedule overdue follow up appt and for medication refills.

## 2022-11-24 ENCOUNTER — Ambulatory Visit: Payer: 59 | Attending: Internal Medicine

## 2022-11-24 DIAGNOSIS — Z5181 Encounter for therapeutic drug level monitoring: Secondary | ICD-10-CM | POA: Diagnosis not present

## 2022-11-24 DIAGNOSIS — I4891 Unspecified atrial fibrillation: Secondary | ICD-10-CM | POA: Diagnosis not present

## 2022-11-24 LAB — POCT INR: INR: 1.1 — AB (ref 2.0–3.0)

## 2022-11-24 NOTE — Patient Instructions (Signed)
Take 3 tablets tonight THEN INCREASE TO 2 TABLETS DAILY, EXCEPT 3 TABLETS ON WEDNESDAYS. Recheck in 3 weeks.

## 2022-11-24 NOTE — Telephone Encounter (Signed)
Pt is scheduled on 7/15.

## 2022-11-25 NOTE — Progress Notes (Deleted)
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 She has been taking furosemide but out of potassium tablets ( at least not on her bag)   Senile purpura: on both arms, stable. Unchanged   MDD: she has a long history of depression, but worse she became a widow about 10 years ago. She was zoloft but she stopped because it caused headaches, we gave her prozac 10mg  and states more stressed lately and would like to go up on dose to 20 mg dose .She is sleeping okay   Atrial fibrillation: has pacemaker for tachycardia-bradycardia syndrome, goes to coumadin clinic, sees Dr. Graciela Husbands, denies bleeding but INR today was very high at over 5 and medication was held. Denies palpitation   CHF systolic: she is on lasix, ace inhibitor ( but not on her medication bag)  She denies orthopnea, but no lower extremity edema. Daughter states some wheezing and SOB with activity intermittently that has been stable, reminded her to follow up with cardiologist   Hypothyroidism: last TSH one year ago was elevated, she is currently taking levothyroxine 50 mcg daily and we will recheck level, she states compliant with medication. She denies hair loss or dry skin, no change in bowel movement   GERD: she states doing better,  denies heartburn or indigestion  OA: deformities on both hands on fingers , denies joint pain on lower extremities , discussed tylenol only prn   CKI : we will recheck labs, denies pruritus, good urine output, she is not sure if taking ACE. We will recheck labs   HTN: does not seem to be taking any of her medications for bp at this time. We had discussed upstream to increase compliance but she is still using Psychologist, forensic.   Incontinence: chronic, she uses depends twice daily, she has urge incontinence and some times overflow incontinence. It is medically necessary for her to wear depends   Patient Active Problem List   Diagnosis Date Noted   Major depression, recurrent, chronic (HCC) 04/10/2020   Stage 3a chronic kidney disease (HCC) 04/10/2020   Age-related osteoporosis without current pathological fracture 04/10/2020   Thrombocytopenia (HCC) 04/10/2020   Angina pectoris (HCC) 04/11/2017   Senile purpura (HCC) 04/29/2015   Chronic anticoagulation 04/29/2015   Obesity 12/10/2014   Hypercholesterolemia 10/15/2013   Myoma 10/15/2013   Encounter for therapeutic drug monitoring 08/08/2013   Pacemaker -Medtronic 05/24/2013   Nonischemic cardiomyopathy (HCC)    Atrial fibrillation (HCC) 02/28/2013   Arteriosclerosis of coronary artery 02/22/2013   Adult hypothyroidism 02/22/2013   Congestive heart failure with left ventricular systolic dysfunction (HCC) 02/22/2013   Tachycardia-bradycardia (HCC) 02/22/2013   Essential hypertension, benign    Atrial myxoma    Coronary atherosclerosis     Past Surgical History:  Procedure Laterality Date   ATRIAL MYXOMA  EXCISION Left    CARDIAC CATHETERIZATION  2012   Unc; right and left heart 75% ostial LAD lesion   CORONARY ARTERY BYPASS GRAFT  2011   [facility]   INSERT / REPLACE / REMOVE PACEMAKER  02/2013   Dual chamber MDT advisa pacemaker. MVP-R70   VAGINAL HYSTERECTOMY      Family History  Problem Relation Age of Onset   Heart failure  Mother    Cirrhosis Mother    Hyperlipidemia Father    Hypertension Father    Stroke Father    Heart attack Father     Social History   Tobacco Use   Smoking status: Never   Smokeless tobacco: Never   Tobacco comments:    smoking cessation materials not required  Substance Use Topics   Alcohol use: No     Current Outpatient Medications:    alendronate (FOSAMAX) 70 MG tablet, Take 1 tablet (70 mg total) by mouth once a week. With full glass of water and do not recline, Disp: 12 tablet, Rfl: 1   atorvastatin (LIPITOR) 80 MG tablet, Take 1 tablet (80 mg total) by mouth daily., Disp: 90 tablet, Rfl: 1   FLUoxetine (PROZAC) 20 MG capsule, Take 1 capsule (20 mg total) by mouth daily., Disp: 90 capsule, Rfl: 1   fluticasone (FLONASE) 50 MCG/ACT nasal spray, instill 2 sprays in each nostril at bedtime, Disp: 48 mL, Rfl: 0   furosemide (LASIX) 40 MG tablet, Take 1 tablet by mouth every other day, Disp: 15 tablet, Rfl: 0   Incontinence Supply Disposable (INCONTINENCE BRIEF MEDIUM) MISC, 1 each by Does not apply route 2 (two) times daily., Disp: 100 each, Rfl: 5   levothyroxine (SYNTHROID) 50 MCG tablet, Take 1 tablet (50 mcg total) by mouth daily. To last until follow up, Disp: 90 tablet, Rfl: 0   lisinopril (PRINIVIL,ZESTRIL) 20 MG tablet, TAKE 1 TABLET (20 MG TOTAL) BY MOUTH DAILY., Disp: 90 tablet, Rfl: 3   sotalol (BETAPACE) 80 MG tablet, Take 1 tablet (80 mg total) by mouth 2 (two) times daily. PLEASE CALL OFFICE TO SCHEDULE FOLLOW UP APPOINTMENT PRIOR TO NEXT REFILL, Disp: 60 tablet, Rfl: 0   Vitamin D, Ergocalciferol, (DRISDOL) 1.25 MG (50000 UNIT) CAPS capsule, Take 1 capsule (50,000 Units total) by mouth every 7 (seven) days., Disp: 12 capsule, Rfl: 3   warfarin (COUMADIN) 2.5 MG tablet, Take 2 tablets daily or as directed by anti-coag clinic - MUST COME TO COUMADIN CLINIC APPT FOR FUTURE REFILLS., Disp: 180 tablet, Rfl: 0  No Known Allergies  I personally reviewed active problem  list, medication list, allergies, family history, social history, health maintenance with the patient/caregiver today.   ROS  ***  Objective  There were no vitals filed for this visit.  There is no height or weight on file to calculate BMI.  Physical Exam ***  Recent Results (from the past 2160 hour(s))  POCT INR     Status: Abnormal   Collection Time: 09/01/22  2:56 PM  Result Value Ref Range   INR 3.3 (A) 2.0 - 3.0   POC INR    POCT INR     Status: Abnormal   Collection Time: 09/22/22  2:59 PM  Result Value Ref Range   INR 1.4 (A) 2.0 - 3.0   POC INR    POCT INR     Status: Abnormal   Collection Time: 11/24/22  2:57 PM  Result Value Ref Range   INR 1.1 (A) 2.0 - 3.0  POC INR      PHQ2/9:    07/29/2022    9:57 AM 06/29/2022    1:32 PM 07/28/2021    8:53 AM 07/21/2021   10:59 AM 12/22/2020    9:14 AM  Depression screen PHQ 2/9  Decreased Interest 2 2 0 0 0  Down, Depressed, Hopeless 2 2 1 1 2   PHQ - 2 Score 4 4 1 1 2   Altered sleeping 1 1 0 0 1  Tired, decreased energy 1 1 0 0 2  Change in appetite 1 1 0 0 0  Feeling bad or failure about yourself  1 1 0 0 0  Trouble concentrating 1 3 0 0 0  Moving slowly or fidgety/restless 1 1 0 0 0  Suicidal thoughts 0 0 0 0 0  PHQ-9 Score 10 12 1 1 5   Difficult doing work/chores Somewhat difficult Somewhat difficult Not difficult at all Not difficult at all Somewhat difficult    phq 9 is {gen pos [id number]}   Fall Risk:    07/29/2022    9:51 AM 06/29/2022    1:31 PM 07/28/2021    8:55 AM 07/21/2021   10:58 AM 12/22/2020    9:14 AM  Fall Risk   Falls in the past year? 0 0 0 0 0  Number falls in past yr: 0  0 0 0  Injury with Fall? 0  0 0 0  Risk for fall due to : No Fall Risks No Fall Risks No Fall Risks No Fall Risks   Follow up Education provided;Falls prevention discussed Falls prevention discussed;Education provided;Falls evaluation completed Falls prevention discussed        Functional Status Survey:       Assessment & Plan  *** There are no diagnoses linked to this encounter.

## 2022-11-27 ENCOUNTER — Other Ambulatory Visit: Payer: Self-pay | Admitting: Family Medicine

## 2022-11-27 DIAGNOSIS — E7800 Pure hypercholesterolemia, unspecified: Secondary | ICD-10-CM

## 2022-11-27 DIAGNOSIS — F339 Major depressive disorder, recurrent, unspecified: Secondary | ICD-10-CM

## 2022-11-27 DIAGNOSIS — M8000XA Age-related osteoporosis with current pathological fracture, unspecified site, initial encounter for fracture: Secondary | ICD-10-CM

## 2022-11-27 DIAGNOSIS — J302 Other seasonal allergic rhinitis: Secondary | ICD-10-CM

## 2022-11-29 ENCOUNTER — Ambulatory Visit: Payer: 59 | Admitting: Family Medicine

## 2022-11-29 NOTE — Telephone Encounter (Signed)
Lvm for pt to schedule an appt  

## 2022-12-06 ENCOUNTER — Other Ambulatory Visit: Payer: Self-pay | Admitting: Family Medicine

## 2022-12-06 ENCOUNTER — Other Ambulatory Visit: Payer: Self-pay | Admitting: Cardiovascular Disease

## 2022-12-15 ENCOUNTER — Ambulatory Visit: Payer: 59 | Attending: Internal Medicine

## 2022-12-15 ENCOUNTER — Ambulatory Visit: Payer: 59

## 2022-12-15 DIAGNOSIS — Z5181 Encounter for therapeutic drug level monitoring: Secondary | ICD-10-CM

## 2022-12-15 DIAGNOSIS — I4891 Unspecified atrial fibrillation: Secondary | ICD-10-CM | POA: Diagnosis not present

## 2022-12-15 LAB — POCT INR: INR: 1.2 — AB (ref 2.0–3.0)

## 2022-12-15 NOTE — Patient Instructions (Signed)
Take 4 tablets tonight and 3 tablets tomorrow THEN INCREASE TO 2 TABLETS DAILY, EXCEPT 3 TABLETS ON MONDAYS  and WEDNESDAYS. Recheck in 3 weeks.

## 2022-12-20 ENCOUNTER — Ambulatory Visit: Payer: 59 | Admitting: Cardiovascular Disease

## 2022-12-25 ENCOUNTER — Other Ambulatory Visit: Payer: Self-pay | Admitting: Family Medicine

## 2022-12-25 ENCOUNTER — Other Ambulatory Visit: Payer: Self-pay | Admitting: Cardiovascular Disease

## 2022-12-25 DIAGNOSIS — I4891 Unspecified atrial fibrillation: Secondary | ICD-10-CM

## 2022-12-25 DIAGNOSIS — J302 Other seasonal allergic rhinitis: Secondary | ICD-10-CM

## 2022-12-27 ENCOUNTER — Other Ambulatory Visit: Payer: Self-pay | Admitting: Family Medicine

## 2022-12-27 DIAGNOSIS — M8000XA Age-related osteoporosis with current pathological fracture, unspecified site, initial encounter for fracture: Secondary | ICD-10-CM

## 2022-12-27 DIAGNOSIS — F339 Major depressive disorder, recurrent, unspecified: Secondary | ICD-10-CM

## 2022-12-27 DIAGNOSIS — E7800 Pure hypercholesterolemia, unspecified: Secondary | ICD-10-CM

## 2022-12-27 NOTE — Telephone Encounter (Signed)
Pease review

## 2023-01-03 ENCOUNTER — Other Ambulatory Visit: Payer: Self-pay | Admitting: Cardiovascular Disease

## 2023-01-03 ENCOUNTER — Other Ambulatory Visit: Payer: Self-pay | Admitting: Internal Medicine

## 2023-01-04 NOTE — Telephone Encounter (Signed)
Lvm for pt to schedule an appt  

## 2023-01-04 NOTE — Telephone Encounter (Signed)
Requested medication (s) are due for refill today: last refill January  Requested medication (s) are on the active medication list: yes   Last refill:  12/07/22 #15 0 refills   Future visit scheduled: no   Notes to clinic:  protocol failed. Last labs 06/29/22. Do you want to refill Rx?     Requested Prescriptions  Pending Prescriptions Disp Refills   furosemide (LASIX) 40 MG tablet [Pharmacy Med Name: Furosemide 40mg  Tablet] 15 tablet 11    Sig: Take 1 tablet by mouth every other day     Cardiovascular:  Diuretics - Loop Failed - 01/03/2023  2:09 AM      Failed - K in normal range and within 180 days    Potassium  Date Value Ref Range Status  06/29/2022 4.1 3.5 - 5.3 mmol/L Final         Failed - Ca in normal range and within 180 days    Calcium  Date Value Ref Range Status  06/29/2022 9.3 8.6 - 10.4 mg/dL Final         Failed - Na in normal range and within 180 days    Sodium  Date Value Ref Range Status  06/29/2022 143 135 - 146 mmol/L Final  03/04/2020 141 134 - 144 mmol/L Final         Failed - Cr in normal range and within 180 days    Creat  Date Value Ref Range Status  06/29/2022 0.95 0.60 - 1.00 mg/dL Final         Failed - Cl in normal range and within 180 days    Chloride  Date Value Ref Range Status  06/29/2022 106 98 - 110 mmol/L Final         Failed - Mg Level in normal range and within 180 days    Magnesium  Date Value Ref Range Status  03/04/2020 1.8 1.6 - 2.3 mg/dL Final         Passed - Last BP in normal range    BP Readings from Last 1 Encounters:  06/29/22 122/70         Passed - Valid encounter within last 6 months    Recent Outpatient Visits           6 months ago Chronic atrial fibrillation [facility])   [place] [facility] Alba Cory, MD   1 year ago Nonischemic cardiomyopathy [facility])   [place] [facility] Alba Cory, MD   2 years ago Congestive heart failure with left ventricular systolic  dysfunction [facility])   [place] [facility] Alba Cory, MD   2 years ago Senile purpura [facility])   [place] [facility] Alba Cory, MD   3 years ago GERD without esophagitis   [facility] Alba Cory, MD       Future Appointments             In 1 month Gollan, Tollie Pizza, MD [facility]

## 2023-01-05 ENCOUNTER — Ambulatory Visit: Payer: 59 | Attending: Internal Medicine

## 2023-01-05 DIAGNOSIS — Z5181 Encounter for therapeutic drug level monitoring: Secondary | ICD-10-CM | POA: Diagnosis not present

## 2023-01-05 DIAGNOSIS — I4891 Unspecified atrial fibrillation: Secondary | ICD-10-CM | POA: Diagnosis not present

## 2023-01-05 LAB — POCT INR: INR: 5 — AB (ref 2.0–3.0)

## 2023-01-05 NOTE — Patient Instructions (Signed)
HOLD TONIGHT AND THURSDAY THEN CONTINUE TO 2 TABLETS DAILY, EXCEPT 3 TABLETS ON MONDAYS  and WEDNESDAYS. Recheck in 2 weeks.

## 2023-01-19 ENCOUNTER — Ambulatory Visit: Payer: 59 | Attending: Internal Medicine

## 2023-01-19 DIAGNOSIS — Z5181 Encounter for therapeutic drug level monitoring: Secondary | ICD-10-CM | POA: Diagnosis not present

## 2023-01-19 DIAGNOSIS — I4891 Unspecified atrial fibrillation: Secondary | ICD-10-CM

## 2023-01-19 LAB — POCT INR: INR: 3.4 — AB (ref 2.0–3.0)

## 2023-01-19 NOTE — Patient Instructions (Signed)
CONTINUE TO 2 TABLETS DAILY, EXCEPT 3 TABLETS ON MONDAYS and WEDNESDAYS. Recheck in 4 weeks.

## 2023-01-22 ENCOUNTER — Other Ambulatory Visit: Payer: Self-pay | Admitting: Cardiovascular Disease

## 2023-01-31 ENCOUNTER — Other Ambulatory Visit: Payer: Self-pay | Admitting: Family Medicine

## 2023-02-01 ENCOUNTER — Other Ambulatory Visit: Payer: Self-pay

## 2023-02-01 NOTE — Telephone Encounter (Signed)
Left message to call us and schedule an appt for this med refill. Per the dr the patient has not been seen in 6 months

## 2023-02-11 NOTE — Progress Notes (Deleted)
Date:  02/11/2023   ID:  [name], DOB [birth year], MRN [id number]  Patient Location:  [address] [place] [address]   Provider location:   [facility], [place] office  PCP:  Alba Cory, MD  Cardiologist:  [facility]  No chief complaint on file.   History of Present Illness:    Felicia Poole is a 77 y.o. female  past medical history of pacemaker Medtronic   atrial fibrillation , post termination pauses.  event recorder: pauses ;   CAD prior atrial myxoma status post resection with one-vessel CABG at [facility] in 2012.  nonischemic cardiomyopathy with ejection fraction 45%  Medication noncompliance Who presents for follow-up of her history of coronary disease prior bypass pacer  Last seen by myself May 2022 Seen by one of our providers June 2023 No recent pacer checks available   Little bit of SOB, has to stop to recover  Not taking lasix, on last clinic visit, was doing once a week, now none Drinks a lot per family who presents with her today Weight up 5 pounds since 04/2020  Problems with depression, wants PMD to cvhange medication  Takes chlorthalidone daily  No chest pain Very sedentary, no regular exercise program  EKG personally reviewed by myself on todays visit Shows NSR paced rhythm rate 64 bpm  Prior CV studies:   The following studies were reviewed today:  Echocardiogram August 2014 ejection fraction 45 to 50%  Other past medical history reviewed nuclear test March 2014 by Dr. Juliann Pares that apparently showed no ischemia and normal left ventricular function.    DATE TEST      8/14 Echo    EF 45-50 %               Husband passed away last summer.  They have been married 50+ years.   Past Medical History:  Diagnosis Date   Atrial myxoma    a. 2012 s/p resection at [facility].   Benign neoplasm of heart    Chronic HFrEF (heart failure with reduced ejection fraction) (HCC)    a. EF: 45% in 2012; b. 01/2013 Echo:  EF 45-50%;  c. 09/2019 Echo: EF 35-40%.   Contact dermatitis and other eczema, due to unspecified cause    Contusion of unspecified site    Coronary atherosclerosis of unspecified type of vessel, native or graft    a. 08/2010 s/p CABG x 1 (LIMA->LAD @ time of atrial myxoma resection ([facility]); b. 01/2013 MV: No ischemia/infarct. EF 71%.   Cramp of limb    Depression    Essential hypertension, benign    Heartburn    Hyperlipidemia LDL goal <70    Lumbago    Nonischemic cardiomyopathy (HCC)    a. EF: 45% in 2012; b. 01/2013 Echo: EF 45-50%, mild diff HK. Mild to mod MR. Mild BAE. Mod TR; c. 09/2019 Echo: EF 35-40%, glob HK, mod enlarged RV w/ nl fxn, RVSP , mild-mod LAE, mild-mod MR, sev TR.   Osteoporosis, unspecified    Other specified cardiac dysrhythmias(427.89)    PAF (paroxysmal atrial fibrillation) (HCC)    a. CHA2DS2VASc = 6-->warfarin.  Rhythm management with sotalol.   Postsurgical percutaneous transluminal coronary angioplasty status    Tachy-brady syndrome (HCC)    a. 02/2013 s/p MDT [id number] Advisa DR MRI DC PPM.   Type II or unspecified type diabetes mellitus without mention of complication, not stated as uncontrolled    Unspecified hypothyroidism    Unspecified menopausal and postmenopausal  disorder    Past Surgical History:  Procedure Laterality Date   ATRIAL MYXOMA EXCISION Left    CARDIAC CATHETERIZATION  2012   Unc; right and left heart 75% ostial LAD lesion   CORONARY ARTERY BYPASS GRAFT  2011   [facility]   INSERT / REPLACE / REMOVE PACEMAKER  02/2013   Dual chamber MDT advisa pacemaker. MVP-R70   VAGINAL HYSTERECTOMY       No outpatient medications have been marked as taking for the 02/14/23 encounter (Appointment) with Antonieta Iba, MD.     Allergies:   Patient has no known allergies.   Social History   Tobacco Use   Smoking status: Never   Smokeless tobacco: Never   Tobacco comments:    smoking cessation materials not required  Vaping Use   Vaping status:  Never Used  Substance Use Topics   Alcohol use: No   Drug use: No     Family Hx: The patient's family history includes Cirrhosis in her mother; Heart attack in her father; Heart failure in her mother; Hyperlipidemia in her father; Hypertension in her father; Stroke in her father.  ROS:   Please see the history of present illness.    Review of Systems  Constitutional: Negative.   Respiratory: Negative.    Cardiovascular: Negative.   Gastrointestinal: Negative.   Musculoskeletal: Negative.   Neurological: Negative.   Psychiatric/Behavioral: Negative.    All other systems reviewed and are negative.    Labs/Other Tests and Data Reviewed:    Recent Labs: 06/29/2022: ALT 15; BUN 18; Creat 0.95; Hemoglobin 12.8; Platelets 152; Potassium 4.1; Sodium 143; TSH 5.43   Recent Lipid Panel Lab Results  Component Value Date/Time   CHOL 193 06/29/2022 02:17 PM   CHOL 228 (H) 04/29/2015 12:10 PM   TRIG 147 06/29/2022 02:17 PM   HDL 52 06/29/2022 02:17 PM   HDL 46 04/29/2015 12:10 PM   CHOLHDL 3.7 06/29/2022 02:17 PM   LDLCALC 115 (H) 06/29/2022 02:17 PM    Wt Readings from Last 3 Encounters:  07/29/22 120 lb (54.4 kg)  06/29/22 120 lb 8 oz (54.7 kg)  12/02/21 127 lb (57.6 kg)     Exam:    Vital Signs: Vital signs may also be detailed in the HPI There were no vitals taken for this visit.  Constitutional:  oriented to person, place, and time. No distress.  HENT:  Head: Grossly normal Eyes:  no discharge. No scleral icterus.  Neck: No JVD, no carotid bruits  Cardiovascular: Regular rate and rhythm, no murmurs appreciated Pulmonary/Chest: Clear to auscultation bilaterally, no wheezes or rails Abdominal: Soft.  no distension.  no tenderness.  Musculoskeletal: Normal range of motion Neurological:  normal muscle tone. Coordination normal. No atrophy Skin: Skin warm and dry Psychiatric: normal affect, pleasant   ASSESSMENT & PLAN:    Sinus node dysfunction (HCC) Has pacemaker,  followed by EP stable  Atrial fibrillation, unspecified type (HCC) Maintaining NSR  Cardiac pacemaker in situ Followed by Dr. Graciela Husbands  Atrial myxoma Prior surgery 2012  Essential hypertension, benign Blood pressure is well controlled on today's visit. No changes made to the medications.  Coronary artery disease of native artery of native heart with stable angina pectoris (HCC) Currently with no symptoms of angina. No further workup at this time. Continue current medication regimen. Stressed importance of staying on her cholesterol medication  Essential hypertension Elevted, fluid overload today She will continue to take Lasix once a week  Hx of CABG Myxoma resection,  bypass graft  Diastolic CHF Lasix today, tomorrow then once a week Discussed looking for warning signs such as abdominal swelling, leg swelling, worsening shortness of breath, has mild to moderate symptoms on today's visit   Total encounter time more than 25 minutes  Greater than 50% was spent in counseling and coordination of care with the patient   Signed, Julien Nordmann, MD  02/11/2023 2:33 PM    [facility] [address], [place], [place] [address]

## 2023-02-14 ENCOUNTER — Ambulatory Visit: Payer: 59 | Admitting: Cardiovascular Disease

## 2023-02-14 DIAGNOSIS — I2510 Atherosclerotic heart disease of native coronary artery without angina pectoris: Secondary | ICD-10-CM

## 2023-02-14 DIAGNOSIS — E7800 Pure hypercholesterolemia, unspecified: Secondary | ICD-10-CM

## 2023-02-14 DIAGNOSIS — I495 Sick sinus syndrome: Secondary | ICD-10-CM

## 2023-02-14 DIAGNOSIS — I10 Essential (primary) hypertension: Secondary | ICD-10-CM

## 2023-02-14 DIAGNOSIS — I428 Other cardiomyopathies: Secondary | ICD-10-CM

## 2023-02-14 DIAGNOSIS — I4891 Unspecified atrial fibrillation: Secondary | ICD-10-CM

## 2023-02-16 ENCOUNTER — Ambulatory Visit: Payer: 59 | Attending: Internal Medicine

## 2023-02-16 DIAGNOSIS — I4891 Unspecified atrial fibrillation: Secondary | ICD-10-CM

## 2023-02-16 DIAGNOSIS — Z5181 Encounter for therapeutic drug level monitoring: Secondary | ICD-10-CM

## 2023-02-16 LAB — POCT INR: INR: 2 (ref 2.0–3.0)

## 2023-02-16 NOTE — Patient Instructions (Signed)
CONTINUE TO 2 TABLETS DAILY, EXCEPT 3 TABLETS ON MONDAYS and WEDNESDAYS. Recheck in 4 weeks.

## 2023-03-16 ENCOUNTER — Ambulatory Visit: Payer: 59 | Attending: Internal Medicine

## 2023-03-16 DIAGNOSIS — Z5181 Encounter for therapeutic drug level monitoring: Secondary | ICD-10-CM

## 2023-03-16 DIAGNOSIS — I4891 Unspecified atrial fibrillation: Secondary | ICD-10-CM

## 2023-03-16 LAB — POCT INR: INR: 3 (ref 2.0–3.0)

## 2023-03-16 NOTE — Patient Instructions (Signed)
CONTINUE TO 2 TABLETS DAILY, EXCEPT 3 TABLETS ON MONDAYS and WEDNESDAYS. Recheck in 6 weeks.

## 2023-03-28 ENCOUNTER — Other Ambulatory Visit: Payer: Self-pay | Admitting: Cardiovascular Disease

## 2023-04-03 NOTE — Progress Notes (Deleted)
Date:  04/03/2023   ID:  [name], DOB Feb 28, 1946, MRN [id number]  Patient Location:  [address] [place] [address]   Provider location:   [facility], [place] office  PCP:  Alba Cory, MD  Cardiologist:  [facility]  No chief complaint on file.   History of Present Illness:    Felicia Poole is a 77 y.o. female  past medical history of pacemaker [employer]   atrial fibrillation , post termination pauses.  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  nonischemic cardiomyopathy with ejection fraction 45%  Medication noncompliance Who presents for follow-up of her history of coronary disease prior bypass pacer  Last seen by myself May 2022   Seen by Dr. Graciela Husbands September 2021  Little bit of SOB, has to stop to recover  Not taking lasix, on last clinic visit, was doing once a week, now none Drinks a lot per family who presents with her today Weight up 5 pounds since 04/2020  Problems with depression, wants PMD to cvhange medication  Takes chlorthalidone daily  No chest pain Very sedentary, no regular exercise program  EKG personally reviewed by myself on todays visit Shows NSR paced rhythm rate 64 bpm  Prior CV studies:   The following studies were reviewed today:  Echocardiogram August 2014 ejection fraction 45 to 50%  Other past medical history reviewed nuclear test March 2014 by Dr. Juliann Pares that apparently showed no ischemia and normal left ventricular function.    DATE TEST      8/14 Echo    EF 45-50 %               Husband passed away last summer.  They have been married 50+ years.   Past Medical History:  Diagnosis Date   Atrial myxoma    a. 2012 s/p resection at [facility].   Benign neoplasm of heart    Chronic HFrEF (heart failure with reduced ejection fraction) (HCC)    a. EF: 45% in 2012; b. 01/2013 Echo: EF 45-50%;  c. 09/2019 Echo: EF  35-40%.   Contact dermatitis and other eczema, due to unspecified cause    Contusion of unspecified site    Coronary atherosclerosis of unspecified type of vessel, native or graft    a. 08/2010 s/p CABG x 1 (LIMA->LAD @ time of atrial myxoma resection ([facility]); b. 01/2013 MV: No ischemia/infarct. EF 71%.   Cramp of limb    Depression    Essential hypertension, benign    Heartburn    Hyperlipidemia LDL goal <70    Lumbago    Nonischemic cardiomyopathy (HCC)    a. EF: 45% in 2012; b. 01/2013 Echo: EF 45-50%, mild diff HK. Mild to mod MR. Mild BAE. Mod TR; c. 09/2019 Echo: EF 35-40%, glob HK, mod enlarged RV w/ nl fxn, RVSP , mild-mod LAE, mild-mod MR, sev TR.   Osteoporosis, unspecified    Other specified cardiac dysrhythmias(427.89)    PAF (paroxysmal atrial fibrillation) (HCC)    a. CHA2DS2VASc = 6-->warfarin.  Rhythm management with sotalol.   Postsurgical percutaneous transluminal coronary angioplasty status    Tachy-brady syndrome (HCC)    a. 02/2013 s/p MDT [id number] Advisa DR MRI DC PPM.   Type II or unspecified type diabetes mellitus without mention of complication, not stated as uncontrolled    Unspecified hypothyroidism    Unspecified menopausal and postmenopausal disorder    Past Surgical  History:  Procedure Laterality Date   ATRIAL MYXOMA EXCISION Left    CARDIAC CATHETERIZATION  2012   [facility]; right and left heart 75% ostial LAD lesion   CORONARY ARTERY BYPASS GRAFT  2011   [facility]   INSERT / REPLACE / REMOVE PACEMAKER  02/2013   Dual chamber MDT advisa pacemaker. [id number]   VAGINAL HYSTERECTOMY       No outpatient medications have been marked as taking for the 04/04/23 encounter (Appointment) with Antonieta Iba, MD.     Allergies:   Patient has no known allergies.   Social History   Tobacco Use   Smoking status: Never   Smokeless tobacco: Never   Tobacco comments:    smoking cessation materials not required  Vaping Use   Vaping status: Never Used  Substance Use  Topics   Alcohol use: No   Drug use: No     Family Hx: The patient's family history includes Cirrhosis in her mother; Heart attack in her father; Heart failure in her mother; Hyperlipidemia in her father; Hypertension in her father; Stroke in her father.  ROS:   Please see the history of present illness.    Review of Systems  Constitutional: Negative.   Respiratory: Negative.    Cardiovascular: Negative.   Gastrointestinal: Negative.   Musculoskeletal: Negative.   Neurological: Negative.   Psychiatric/Behavioral: Negative.    All other systems reviewed and are negative.    Labs/Other Tests and Data Reviewed:    Recent Labs: 06/29/2022: ALT 15; BUN 18; Creat 0.95; Hemoglobin 12.8; Platelets 152; Potassium 4.1; Sodium 143; TSH 5.43   Recent Lipid Panel Lab Results  Component Value Date/Time   CHOL 193 06/29/2022 02:17 PM   CHOL 228 (H) 04/29/2015 12:10 PM   TRIG 147 06/29/2022 02:17 PM   HDL 52 06/29/2022 02:17 PM   HDL 46 04/29/2015 12:10 PM   CHOLHDL 3.7 06/29/2022 02:17 PM   LDLCALC 115 (H) 06/29/2022 02:17 PM    Wt Readings from Last 3 Encounters:  07/29/22 120 lb (54.4 kg)  06/29/22 120 lb 8 oz (54.7 kg)  12/02/21 127 lb (57.6 kg)     Exam:    Vital Signs: Vital signs may also be detailed in the HPI There were no vitals taken for this visit.  Constitutional:  oriented to person, place, and time. No distress.  HENT:  Head: Grossly normal Eyes:  no discharge. No scleral icterus.  Neck: No JVD, no carotid bruits  Cardiovascular: Regular rate and rhythm, no murmurs appreciated Pulmonary/Chest: Clear to auscultation bilaterally, no wheezes or rails Abdominal: Soft.  no distension.  no tenderness.  Musculoskeletal: Normal range of motion Neurological:  normal muscle tone. Coordination normal. No atrophy Skin: Skin warm and dry Psychiatric: normal affect, pleasant   ASSESSMENT & PLAN:    Sinus node dysfunction (HCC) Has pacemaker, followed by  EP stable  Atrial fibrillation, unspecified type (HCC) Maintaining NSR  Cardiac pacemaker in situ Followed by Dr. Graciela Husbands  Atrial myxoma Prior surgery 2012  Essential hypertension, benign Blood pressure is well controlled on today's visit. No changes made to the medications.  Coronary artery disease of native artery of native heart with stable angina pectoris (HCC) Currently with no symptoms of angina. No further workup at this time. Continue current medication regimen. Stressed importance of staying on her cholesterol medication  Essential hypertension Elevted, fluid overload today She will continue to take Lasix once a week  Hx of CABG Myxoma resection, bypass graft  Diastolic CHF Lasix  today, tomorrow then once a week Discussed looking for warning signs such as abdominal swelling, leg swelling, worsening shortness of breath, has mild to moderate symptoms on today's visit   Total encounter time more than 25 minutes  Greater than 50% was spent in counseling and coordination of care with the patient   Signed, Julien Nordmann, MD  04/03/2023 2:13 PM    [facility] [address], [place], [place] [address]

## 2023-04-04 ENCOUNTER — Ambulatory Visit: Payer: 59 | Attending: Cardiovascular Disease | Admitting: Cardiovascular Disease

## 2023-04-04 DIAGNOSIS — I495 Sick sinus syndrome: Secondary | ICD-10-CM

## 2023-04-04 DIAGNOSIS — I428 Other cardiomyopathies: Secondary | ICD-10-CM

## 2023-04-04 DIAGNOSIS — I10 Essential (primary) hypertension: Secondary | ICD-10-CM

## 2023-04-04 DIAGNOSIS — I2510 Atherosclerotic heart disease of native coronary artery without angina pectoris: Secondary | ICD-10-CM

## 2023-04-04 DIAGNOSIS — E7800 Pure hypercholesterolemia, unspecified: Secondary | ICD-10-CM

## 2023-04-04 DIAGNOSIS — I4891 Unspecified atrial fibrillation: Secondary | ICD-10-CM

## 2023-04-05 ENCOUNTER — Encounter: Payer: Self-pay | Admitting: Cardiovascular Disease

## 2023-04-16 ENCOUNTER — Other Ambulatory Visit: Payer: Self-pay

## 2023-04-16 ENCOUNTER — Emergency Department: Payer: 59

## 2023-04-16 ENCOUNTER — Emergency Department
Admission: EM | Admit: 2023-04-16 | Discharge: 2023-04-16 | Disposition: A | Discharge status: Home / Self Care | Payer: 59 | Attending: Emergency Medicine | Admitting: Emergency Medicine

## 2023-04-16 DIAGNOSIS — I6529 Occlusion and stenosis of unspecified carotid artery: Secondary | ICD-10-CM | POA: Diagnosis not present

## 2023-04-16 DIAGNOSIS — M1909 Primary osteoarthritis, other specified site: Secondary | ICD-10-CM | POA: Diagnosis not present

## 2023-04-16 DIAGNOSIS — I6782 Cerebral ischemia: Secondary | ICD-10-CM | POA: Diagnosis not present

## 2023-04-16 DIAGNOSIS — S00212A Abrasion of left eyelid and periocular area, initial encounter: Secondary | ICD-10-CM | POA: Diagnosis not present

## 2023-04-16 DIAGNOSIS — I10 Essential (primary) hypertension: Secondary | ICD-10-CM | POA: Diagnosis not present

## 2023-04-16 DIAGNOSIS — I4891 Unspecified atrial fibrillation: Secondary | ICD-10-CM | POA: Insufficient documentation

## 2023-04-16 DIAGNOSIS — R600 Localized edema: Secondary | ICD-10-CM

## 2023-04-16 DIAGNOSIS — S0993XA Unspecified injury of face, initial encounter: Secondary | ICD-10-CM

## 2023-04-16 DIAGNOSIS — S0592XA Unspecified injury of left eye and orbit, initial encounter: Secondary | ICD-10-CM | POA: Diagnosis not present

## 2023-04-16 DIAGNOSIS — W228XXA Striking against or struck by other objects, initial encounter: Secondary | ICD-10-CM | POA: Insufficient documentation

## 2023-04-16 DIAGNOSIS — I509 Heart failure, unspecified: Secondary | ICD-10-CM | POA: Diagnosis not present

## 2023-04-16 DIAGNOSIS — R8290 Unspecified abnormal findings in urine: Secondary | ICD-10-CM | POA: Diagnosis not present

## 2023-04-16 DIAGNOSIS — H02849 Edema of unspecified eye, unspecified eyelid: Secondary | ICD-10-CM | POA: Diagnosis not present

## 2023-04-16 DIAGNOSIS — I2510 Atherosclerotic heart disease of native coronary artery without angina pectoris: Secondary | ICD-10-CM | POA: Diagnosis not present

## 2023-04-16 DIAGNOSIS — S0003XA Contusion of scalp, initial encounter: Secondary | ICD-10-CM | POA: Insufficient documentation

## 2023-04-16 DIAGNOSIS — S0990XA Unspecified injury of head, initial encounter: Secondary | ICD-10-CM | POA: Diagnosis not present

## 2023-04-16 DIAGNOSIS — M8580 Other specified disorders of bone density and structure, unspecified site: Secondary | ICD-10-CM | POA: Insufficient documentation

## 2023-04-16 DIAGNOSIS — Z950 Presence of cardiac pacemaker: Secondary | ICD-10-CM | POA: Diagnosis not present

## 2023-04-16 DIAGNOSIS — N189 Chronic kidney disease, unspecified: Secondary | ICD-10-CM | POA: Diagnosis not present

## 2023-04-16 DIAGNOSIS — R220 Localized swelling, mass and lump, head: Secondary | ICD-10-CM | POA: Diagnosis not present

## 2023-04-16 DIAGNOSIS — R609 Edema, unspecified: Secondary | ICD-10-CM | POA: Diagnosis not present

## 2023-04-16 LAB — URINALYSIS, ROUTINE W REFLEX MICROSCOPIC
Bacteria, UA: NONE SEEN
Bilirubin Urine: NEGATIVE
Glucose, UA: NEGATIVE mg/dL
Hgb urine dipstick: NEGATIVE
Ketones, ur: NEGATIVE mg/dL
Nitrite: NEGATIVE
Protein, ur: NEGATIVE mg/dL
Specific Gravity, Urine: 1.02 (ref 1.005–1.030)
pH: 6 (ref 5.0–8.0)
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
## 2023-04-17 LAB — URINE CULTURE: Culture: NO GROWTH

## 2023-04-25 ENCOUNTER — Other Ambulatory Visit: Payer: Self-pay | Admitting: Cardiovascular Disease

## 2023-04-25 NOTE — Telephone Encounter (Signed)
30 day supply rx sent on 03/28/23.  Will wait to fill after 04/26/23 visit to ensure refill appropriate.  Patient has multiple NS and cancellations.   Last office visit 10/07/20 with plan to f/u in 6 months  next office visit:  04/26/23

## 2023-04-26 ENCOUNTER — Encounter: Payer: Self-pay | Admitting: Medical

## 2023-04-26 ENCOUNTER — Ambulatory Visit: Payer: 59 | Attending: Medical | Admitting: Medical

## 2023-04-26 VITALS — BP 162/111 | HR 65 | Ht <= 58 in | Wt 116.0 lb

## 2023-04-26 DIAGNOSIS — I428 Other cardiomyopathies: Secondary | ICD-10-CM

## 2023-04-26 DIAGNOSIS — I495 Sick sinus syndrome: Secondary | ICD-10-CM

## 2023-04-26 DIAGNOSIS — Z79899 Other long term (current) drug therapy: Secondary | ICD-10-CM

## 2023-04-26 DIAGNOSIS — I10 Essential (primary) hypertension: Secondary | ICD-10-CM | POA: Diagnosis not present

## 2023-04-26 DIAGNOSIS — I2510 Atherosclerotic heart disease of native coronary artery without angina pectoris: Secondary | ICD-10-CM | POA: Diagnosis not present

## 2023-04-26 DIAGNOSIS — I5022 Chronic systolic (congestive) heart failure: Secondary | ICD-10-CM | POA: Diagnosis not present

## 2023-04-26 DIAGNOSIS — E782 Mixed hyperlipidemia: Secondary | ICD-10-CM

## 2023-04-26 DIAGNOSIS — I071 Rheumatic tricuspid insufficiency: Secondary | ICD-10-CM

## 2023-04-26 MED ORDER — ENTRESTO 24-26 MG PO TABS: 1.0000 | ORAL_TABLET | Freq: Two times a day (BID) | ORAL | 3 refills | Status: AC

## 2023-04-26 NOTE — Progress Notes (Unsigned)
Cardiology Office Note:    Date:  04/28/2023   ID:  Felicia Poole, DOB 10/03/1945, MRN [id number]  PCP:  Alba Cory, MD  [facility] Cardiologist:  Julien Nordmann, MD  [facility] Electrophysiologist:  None   Referring MD: Alba Cory, MD   Chief Complaint: overdue follow-up  History of Present Illness:    Felicia Poole is a 77 y.o. 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   The patient had CABG x 1 with LIMA to LAD in 2012 [facility] with resection of atrial myxoma.  She underwent nuclear stress test in March 2014 per Dr. Juliann Pares that showed no evidence of ischemia with a normal EF.  Outpatient heart monitor showed frequent episodes of A-fib with RVR with prolonged 3 to 6-second pauses with bradycardia.  She underwent Medtronic PPM placement in September 2014 at [facility].  This is followed by Dr. Graciela Husbands.  She has been on sotalol for rhythm control.  She is on warfarin for A-fib.  Echo in April 2021 showed EF of 35 to 40%, mildly decreased LV function, global hypokinesis, low normal RV SF, moderately enlarged RV, moderately elevated pulmonary artery systolic pressure, estimated RV systolic pressure 50 mmHg, mild to moderately dilated left atrium, mild to moderate MR, severe TR.  The patient was last seen 11/2021 and was stable from a cardiac perspective.   Today, the patient is overall doing well. She denies chest pain, SOB. She has trace lower leg edema. She eats low salt and wears compression socks. She denies orthopnea or pnd. No lightheadedness or dizziness. BP today is high. She does not check BP at home. She takes lasix 40mg  once weekly. Appears to be V pacing on EKG.    Past Medical History:  Diagnosis Date   Atrial myxoma    a. 2012 s/p resection at [facility].   Benign neoplasm of heart    Chronic HFrEF (heart  failure with reduced ejection fraction) (HCC)    a. EF: 45% in 2012; b. 01/2013 Echo: EF 45-50%;  c. 09/2019 Echo: EF 35-40%.   Contact dermatitis and other eczema, due to unspecified cause    Contusion of unspecified site    Coronary atherosclerosis of unspecified type of vessel, native or graft    a. 08/2010 s/p CABG x 1 (LIMA->LAD @ time of atrial myxoma resection ([facility]); b. 01/2013 MV: No ischemia/infarct. EF 71%.   Cramp of limb    Depression    Essential hypertension, benign    Heartburn    Hyperlipidemia LDL goal <70    Lumbago    Nonischemic cardiomyopathy (HCC)    a. EF: 45% in 2012; b. 01/2013 Echo: EF 45-50%, mild diff HK. Mild to mod MR. Mild BAE. Mod TR; c. 09/2019 Echo: EF 35-40%, glob HK, mod enlarged RV w/ nl fxn, RVSP , mild-mod LAE, mild-mod MR, sev TR.   Osteoporosis, unspecified    Other specified cardiac dysrhythmias(427.89)    PAF (paroxysmal atrial fibrillation) (HCC)    a. CHA2DS2VASc = 6-->warfarin.  Rhythm management with sotalol.   Postsurgical percutaneous transluminal coronary angioplasty status    Tachy-brady syndrome (HCC)    a. 02/2013 s/p MDT [id number] Advisa DR MRI DC PPM.   Type II or unspecified type diabetes mellitus without mention of complication, not stated as uncontrolled    Unspecified hypothyroidism    Unspecified  menopausal and postmenopausal disorder     Past Surgical History:  Procedure Laterality Date   ATRIAL MYXOMA EXCISION Left    CARDIAC CATHETERIZATION  2012   [facility]; right and left heart 75% ostial LAD lesion   CORONARY ARTERY BYPASS GRAFT  2011   [facility]   INSERT / REPLACE / REMOVE PACEMAKER  02/2013   Dual chamber MDT advisa pacemaker. MVP-R70   VAGINAL HYSTERECTOMY      Current Medications: Current Meds  Medication Sig   alendronate (FOSAMAX) 70 MG tablet Take 1 tablet (70 mg total) by mouth once a week. With full glass of water and do not recline   atorvastatin (LIPITOR) 80 MG tablet Take 1 tablet (80 mg total) by mouth daily.    FLUoxetine (PROZAC) 20 MG capsule Take 1 capsule (20 mg total) by mouth daily.   fluticasone (FLONASE) 50 MCG/ACT nasal spray Use 2 sprays into each nostril at bedtime   furosemide (LASIX) 40 MG tablet Take 1 tablet by mouth every other day   Incontinence Supply Disposable (INCONTINENCE BRIEF MEDIUM) MISC 1 each by Does not apply route 2 (two) times daily.   levothyroxine (SYNTHROID) 50 MCG tablet Take 1 tablet (50 mcg total) by mouth daily. To last until follow up   sacubitril-valsartan (ENTRESTO) 24-26 MG Take 1 tablet by mouth 2 (two) times daily.   Vitamin D, Ergocalciferol, (DRISDOL) 1.25 MG (50000 UNIT) CAPS capsule Take 1 capsule (50,000 Units total) by mouth every 7 (seven) days.   warfarin (COUMADIN) 2.5 MG tablet Take 2 tablets by mouth every day or as directed by anti-coag clinic. Must go to clinic appointment   [DISCONTINUED] lisinopril (PRINIVIL,ZESTRIL) 20 MG tablet TAKE 1 TABLET (20 MG TOTAL) BY MOUTH DAILY.   [DISCONTINUED] sotalol (BETAPACE) 80 MG tablet Take 1 tablet by mouth twice daily. Keep appt     Allergies:   Patient has no known allergies.   Social History   Socioeconomic History   Marital status: Widowed    Spouse name: Mathis Poole   Number of children: 5   Years of education: Not on file   Highest education level: 9th grade  Occupational History    Employer: DISABLED  Tobacco Use   Smoking status: Never   Smokeless tobacco: Never   Tobacco comments:    smoking cessation materials not required  Vaping Use   Vaping status: Never Used  Substance and Sexual Activity   Alcohol use: No   Drug use: No   Sexual activity: Never  Other Topics Concern   Not on file  Social History Narrative   Pt lives with son, daughter Felicia Poole, son in Social worker, granddaughter   Social Determinants of Health   Financial Resource Strain: Low Risk  (07/29/2022)   Overall Financial Resource Strain (CARDIA)    Difficulty of Paying Living Expenses: Not hard at all  Food Insecurity: No Food  Insecurity (07/29/2022)   Hunger Vital Sign    Worried About Running Out of Food in the Last Year: Never true    Ran Out of Food in the Last Year: Never true  Transportation Needs: No Transportation Needs (07/29/2022)   PRAPARE - Administrator, Civil Service (Medical): No    Lack of Transportation (Non-Medical): No  Physical Activity: Inactive (07/29/2022)   Exercise Vital Sign    Days of Exercise per Week: 0 days    Minutes of Exercise per Session: 0 min  Stress: No Stress Concern Present (07/29/2022)   [employer] of Occupational Health -  Occupational Stress Questionnaire    Feeling of Stress : Only a little  Social Connections: Moderately Isolated (07/29/2022)   Social Connection and Isolation Panel [NHANES]    Frequency of Communication with Friends and Family: More than three times a week    Frequency of Social Gatherings with Friends and Family: More than three times a week    Attends Religious Services: More than 4 times per year    Active Member of [employer] or Organizations: No    Attends Banker Meetings: Never    Marital Status: Widowed     Family History: The patient's family history includes Cirrhosis in her mother; Heart attack in her father; Heart failure in her mother; Hyperlipidemia in her father; Hypertension in her father; Stroke in her father.  ROS:   Please see the history of present illness.     All other systems reviewed and are negative.  EKGs/Labs/Other Studies Reviewed:    The following studies were reviewed today:  Inclinic device check 02/2020 Pacemaker check in clinic. Normal device function. Thresholds, sensing, impedances consistent with previous measurements. Device programmed to maximize longevity. 18 high ventricular rates noted. Device programmed at appropriate safety margins. Histogram   distribution appropriate for patient activity level. Device programmed to optimize intrinsic conduction. Estimated longevity 2.5years.  Patient continues to decline remote follow-up, ROV with Dr. Graciela Husbands in 6 months. Patient education completed.Judy Pimple,   BSN, RN   Echo [phone]  1. Left ventricular ejection fraction, by estimation, is 35 to 40%. The  left ventricle has moderately decreased function. The left ventricle  demonstrates global hypokinesis. Left ventricular diastolic parameters are  indeterminate.   2. Right ventricular systolic function is low normal. The right  ventricular size is moderately enlarged. There is moderately elevated  pulmonary artery systolic pressure. The estimated right ventricular  systolic pressure is 50.0 mmHg.   3. Left atrial size was mild to moderately dilated.   4. Mild to moderate mitral valve regurgitation.   5. Tricuspid valve regurgitation is severe.   6. The inferior vena cava is dilated in size with <50% respiratory  variability, suggesting right atrial pressure of 15 mmHg.    EKG:  EKG is ordered today.  The ekg ordered today demonstrates v paced rhythm 65bpm  Recent Labs: 04/27/2023: ALT 18; BUN 13; Creatinine, Ser 1.01; Hemoglobin 13.8; Magnesium 1.9; Platelets 163; Potassium 4.4; Sodium 141; TSH 4.180  Recent Lipid Panel    Component Value Date/Time   CHOL 125 04/27/2023 1535   TRIG 56 04/27/2023 1535   HDL 57 04/27/2023 1535   CHOLHDL 2.2 04/27/2023 1535   CHOLHDL 3.7 06/29/2022 1417   LDLCALC 56 04/27/2023 1535   LDLCALC 115 (H) 06/29/2022 1417   LDLDIRECT 61 04/27/2023 1535      Physical Exam:    VS:  BP (!) 162/111 (BP Location: Left Arm, Patient Position: Sitting, Cuff Size: Normal)   Pulse 65   Ht 4\' 9"  (1.448 m)   Wt 116 lb (52.6 kg)   SpO2 97%   BMI 25.10 kg/m     Wt Readings from Last 3 Encounters:  04/26/23 116 lb (52.6 kg)  07/29/22 120 lb (54.4 kg)  06/29/22 120 lb 8 oz (54.7 kg)     GEN:  Well nourished, well developed in no acute distress HEENT: Normal NECK: No JVD; No carotid bruits LYMPHATICS: No lymphadenopathy CARDIAC: RRR, no  murmurs, rubs, gallops RESPIRATORY:  Clear to auscultation without rales, wheezing or rhonchi  ABDOMEN: Soft, non-tender, non-distended  MUSCULOSKELETAL:  trace lower leg edema; No deformity  SKIN: Warm and dry NEUROLOGIC:  Alert and oriented x 3 PSYCHIATRIC:  Normal affect   ASSESSMENT:    1. Coronary artery disease involving native coronary artery of native heart without angina pectoris   2. Medication management   3. Nonischemic cardiomyopathy (HCC)   4. Chronic systolic heart failure (HCC)   5. Tricuspid valve insufficiency, unspecified etiology   6. Tachycardia-bradycardia (HCC)   7. Essential hypertension, benign   8. Hyperlipidemia, mixed    PLAN:    In order of problems listed above:  CAD s/p CABG x1 in 2015 at [facility] She denies anginal symptoms. Continue Lipitor. No ASA given coumadin\.  Paroxysmal afib EKG shows V paced rhythm. I will send her back to EP. She is on Sotalol 80mg  BID. I will update labs CBC, TSH and Mag. Continue coumadin for stroke ppx. Most recent INR 3.0, has been subtherapeutic in the past.   Tachybrady syndrome s/p PPM Last device check was in 2021 which was normal and it stated she declined remote follow-up. I will refer back to EP.   NICM Severe TR Echo in 2021 showed LVEF 35-40%, global HK, low normal RVSF, moderately enlarged RV, moderately elevated pulmonary artery systolic pressure, mild to mod MR, severe TR. She is on lisinopril 20mg  dialy. Recently elevated BP. Stop lisinopril and start Etnresto 24-26mg  BID on 11/21. I will re-check an echocardiogram. We will continue GDMT at follow-up. She takes lasix 40mg  once weekly/PRN. She has trace lower leg edema on exam.   HTN BP has recently been elevated. Stop lisinopril and start Entresto as above. We will continue to follow.  HLD LDL 126 in 2023. I will re-check lipids today. Continue Lipitor 80mg  dialy.   Disposition: Follow up 1 month with MD/APP    Signed, Jordin Dambrosio David Stall, PA-C   04/28/2023 10:49 AM    [facility]

## 2023-04-26 NOTE — Patient Instructions (Signed)
Medication Instructions:  Your physician recommends the following medication changes.  STOP TAKING: Lisinopril   START TAKING: Entresto 24-26 mg by mouth twice a day (Please don't start until Thursday 04/28/23)  *If you need a refill on your cardiac medications before your next appointment, please call your pharmacy*   Lab Work: Your provider would like for you to return tomorrow to have the following labs drawn: (CMP, direct LDL, lipid, CBC, TSH, Mg).   Please go to [facility] [address] ([facility] [address], [place] [address] You do not need an appointment.  They are open from 7:30 am-4 pm.  Lunch from 1:00 pm- 2:00 pm You will need to be fasting.  Testing/Procedures: Your physician has requested that you have an echocardiogram. Echocardiography is a painless test that uses sound waves to create images of your heart. It provides your doctor with information about the size and shape of your heart and how well your heart's chambers and valves are working.   You may receive an ultrasound enhancing agent through an IV if needed to better visualize your heart during the echo. This procedure takes approximately one hour.  There are no restrictions for this procedure.  This will take place at [address] [facility]) [address], [place] [address]  Please note: We ask at that you not bring children with you during ultrasound (echo/ vascular) testing. Due to room size and safety concerns, children are not allowed in the ultrasound rooms during exams. Our front office staff cannot provide observation of children in our lobby area while testing is being conducted. An adult accompanying a patient to their appointment will only be allowed in the ultrasound room at the discretion of the ultrasound technician under special circumstances. We apologize for any inconvenience.    Follow-Up: At [facility], you and your health needs are our priority.   As part of our continuing mission to provide you with exceptional heart care, we have created designated Provider Care Teams.  These Care Teams include your primary Cardiologist (physician) and Advanced Practice Providers (APPs -  Physician Assistants and Nurse Practitioners) who all work together to provide you with the care you need, when you need it.  We recommend signing up for the patient portal called "MyChart".  Sign up information is provided on this After Visit Summary.  MyChart is used to connect with patients for Virtual Visits (Telemedicine).  Patients are able to view lab/test results, encounter notes, upcoming appointments, etc.  Non-urgent messages can be sent to your provider as well.   To learn more about what you can do with MyChart, go to [email or web address].    Your next appointment:   3 week(s)  Provider:   Terrilee Croak, PA-C   Your physician recommends that you schedule a follow-up appointment with Sherie Don, NP for EP

## 2023-04-27 ENCOUNTER — Ambulatory Visit: Payer: 59 | Attending: Internal Medicine

## 2023-04-27 DIAGNOSIS — I4891 Unspecified atrial fibrillation: Secondary | ICD-10-CM | POA: Diagnosis not present

## 2023-04-27 DIAGNOSIS — Z5181 Encounter for therapeutic drug level monitoring: Secondary | ICD-10-CM

## 2023-04-27 DIAGNOSIS — Z79899 Other long term (current) drug therapy: Secondary | ICD-10-CM | POA: Diagnosis not present

## 2023-04-27 LAB — POCT INR: INR: 2.2 (ref 2.0–3.0)

## 2023-04-27 NOTE — Patient Instructions (Signed)
CONTINUE TO 2 TABLETS DAILY, EXCEPT 3 TABLETS ON MONDAYS and WEDNESDAYS. Recheck in 6 weeks.

## 2023-04-28 LAB — COMPREHENSIVE METABOLIC PANEL
ALT: 18 [IU]/L (ref 0–32)
AST: 25 [IU]/L (ref 0–40)
Albumin: 4 g/dL (ref 3.8–4.8)
Alkaline Phosphatase: 126 [IU]/L — ABNORMAL HIGH (ref 44–121)
BUN/Creatinine Ratio: 13 (ref 12–28)
BUN: 13 mg/dL (ref 8–27)
Bilirubin Total: 0.9 mg/dL (ref 0.0–1.2)
CO2: 27 mmol/L (ref 20–29)
Calcium: 9.5 mg/dL (ref 8.7–10.3)
Chloride: 104 mmol/L (ref 96–106)
Creatinine, Ser: 1.01 mg/dL — ABNORMAL HIGH (ref 0.57–1.00)
Globulin, Total: 2.5 g/dL (ref 1.5–4.5)
Glucose: 90 mg/dL (ref 70–99)
Potassium: 4.4 mmol/L (ref 3.5–5.2)
Sodium: 141 mmol/L (ref 134–144)
Total Protein: 6.5 g/dL (ref 6.0–8.5)
eGFR: 57 mL/min/{1.73_m2} — ABNORMAL LOW (ref >59)

## 2023-04-28 LAB — CBC
Hematocrit: 41.5 % (ref 34.0–46.6)
Hemoglobin: 13.8 g/dL (ref 11.1–15.9)
MCH: 32.2 pg (ref 26.6–33.0)
MCHC: 33.3 g/dL (ref 31.5–35.7)
MCV: 97 fL (ref 79–97)
Platelets: 163 10*3/uL (ref 150–450)
RBC: 4.29 x10E6/uL (ref 3.77–5.28)
RDW: 12.2 % (ref 11.7–15.4)
WBC: 4.3 10*3/uL (ref 3.4–10.8)

## 2023-04-28 LAB — LIPID PANEL
Chol/HDL Ratio: 2.2 ratio (ref 0.0–4.4)
Cholesterol, Total: 125 mg/dL (ref 100–199)
HDL: 57 mg/dL (ref >39)
LDL Chol Calc (NIH): 56 mg/dL (ref 0–99)
Triglycerides: 56 mg/dL (ref 0–149)
VLDL Cholesterol Cal: 12 mg/dL (ref 5–40)

## 2023-04-28 LAB — LDL CHOLESTEROL, DIRECT: LDL Direct: 61 mg/dL (ref 0–99)

## 2023-04-28 LAB — MAGNESIUM: Magnesium: 1.9 mg/dL (ref 1.6–2.3)

## 2023-04-28 LAB — TSH: TSH: 4.18 u[IU]/mL (ref 0.450–4.500)

## 2023-05-19 ENCOUNTER — Ambulatory Visit: Payer: 59

## 2023-06-06 ENCOUNTER — Ambulatory Visit: Payer: 59 | Admitting: Medical

## 2023-06-06 NOTE — Progress Notes (Deleted)
Cardiology Office Note:    Date:  06/06/2023   ID:  Felicia Poole, DOB [birth year], MRN [id number]  PCP:  Alba Cory, MD  [facility] Cardiologist:  Julien Nordmann, MD  [facility] Electrophysiologist:  None   Referring MD: Alba Cory, MD   Chief Complaint: ***  History of Present Illness:    Felicia Poole is a 77 y.o. 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    The patient had CABG x 1 with LIMA to LAD in 2012 [facility] with resection of atrial myxoma.  She underwent nuclear stress test in March 2014 per Dr. Juliann Pares that showed no evidence of ischemia with a normal EF.  Outpatient heart monitor showed frequent episodes of A-fib with RVR with prolonged 3 to 6-second pauses with bradycardia.  She underwent Medtronic PPM placement in September 2014 at [facility].  This is followed by Dr. Graciela Husbands.  She has been on sotalol for rhythm control.  She is on warfarin for A-fib.  Echo in April 2021 showed EF of 35 to 40%, mildly decreased LV function, global hypokinesis, low normal RV SF, moderately enlarged RV, moderately elevated pulmonary artery systolic pressure, estimated RV systolic pressure 50 mmHg, mild to moderately dilated left atrium, mild to moderate MR, severe TR.  The patient was last seen 04/26/23 and was OK. She was V pacing and referred back to EP. She was started on Entresto.   Past Medical History:  Diagnosis Date   Atrial myxoma    a. 2012 s/p resection at [facility].   Benign neoplasm of heart    Chronic HFrEF (heart failure with reduced ejection fraction) (HCC)    a. EF: 45% in 2012; b. 01/2013 Echo: EF 45-50%;  c. 09/2019 Echo: EF 35-40%.   Contact dermatitis and other eczema, due to unspecified cause    Contusion of unspecified site    Coronary atherosclerosis of unspecified type of vessel, native or graft     a. 08/2010 s/p CABG x 1 (LIMA->LAD @ time of atrial myxoma resection ([facility]); b. 01/2013 MV: No ischemia/infarct. EF 71%.   Cramp of limb    Depression    Essential hypertension, benign    Heartburn    Hyperlipidemia LDL goal <70    Lumbago    Nonischemic cardiomyopathy (HCC)    a. EF: 45% in 2012; b. 01/2013 Echo: EF 45-50%, mild diff HK. Mild to mod MR. Mild BAE. Mod TR; c. 09/2019 Echo: EF 35-40%, glob HK, mod enlarged RV w/ nl fxn, RVSP , mild-mod LAE, mild-mod MR, sev TR.   Osteoporosis, unspecified    Other specified cardiac dysrhythmias(427.89)    PAF (paroxysmal atrial fibrillation) (HCC)    a. CHA2DS2VASc = 6-->warfarin.  Rhythm management with sotalol.   Postsurgical percutaneous transluminal coronary angioplasty status    Tachy-brady syndrome (HCC)    a. 02/2013 s/p MDT [id number] Advisa DR MRI DC PPM.   Type II or unspecified type diabetes mellitus without mention of complication, not stated as uncontrolled    Unspecified hypothyroidism    Unspecified menopausal and postmenopausal disorder     Past Surgical History:  Procedure Laterality Date   ATRIAL MYXOMA EXCISION Left    CARDIAC CATHETERIZATION  2012   [facility]; right and left heart 75% ostial LAD lesion   CORONARY ARTERY BYPASS GRAFT  2011   [facility]  INSERT / REPLACE / REMOVE PACEMAKER  02/2013   Dual chamber MDT advisa pacemaker. MVP-R70   VAGINAL HYSTERECTOMY      Current Medications: No outpatient medications have been marked as taking for the 06/06/23 encounter (Appointment) with Fransico Michael, Seena Face H, PA-C.     Allergies:   Patient has no known allergies.   Social History   Socioeconomic History   Marital status: Widowed    Spouse name: Mathis Fare   Number of children: 5   Years of education: Not on file   Highest education level: 9th grade  Occupational History    Employer: DISABLED  Tobacco Use   Smoking status: Never   Smokeless tobacco: Never   Tobacco comments:    smoking cessation materials not required   Vaping Use   Vaping status: Never Used  Substance and Sexual Activity   Alcohol use: No   Drug use: No   Sexual activity: Never  Other Topics Concern   Not on file  Social History Narrative   Pt lives with son, daughter Bonita Quin, son in Social worker, granddaughter   Social Drivers of Health   Financial Resource Strain: Low Risk  (07/29/2022)   Overall Financial Resource Strain (CARDIA)    Difficulty of Paying Living Expenses: Not hard at all  Food Insecurity: No Food Insecurity (07/29/2022)   Hunger Vital Sign    Worried About Running Out of Food in the Last Year: Never true    Ran Out of Food in the Last Year: Never true  Transportation Needs: No Transportation Needs (07/29/2022)   PRAPARE - Administrator, Civil Service (Medical): No    Lack of Transportation (Non-Medical): No  Physical Activity: Inactive (07/29/2022)   Exercise Vital Sign    Days of Exercise per Week: 0 days    Minutes of Exercise per Session: 0 min  Stress: No Stress Concern Present (07/29/2022)   [employer] of Occupational Health - Occupational Stress Questionnaire    Feeling of Stress : Only a little  Social Connections: Moderately Isolated (07/29/2022)   Social Connection and Isolation Panel [NHANES]    Frequency of Communication with Friends and Family: More than three times a week    Frequency of Social Gatherings with Friends and Family: More than three times a week    Attends Religious Services: More than 4 times per year    Active Member of [employer] or Organizations: No    Attends Banker Meetings: Never    Marital Status: Widowed     Family History: The patient's ***family history includes Cirrhosis in her mother; Heart attack in her father; Heart failure in her mother; Hyperlipidemia in her father; Hypertension in her father; Stroke in her father.  ROS:   Please see the history of present illness.    *** All other systems reviewed and are negative.  EKGs/Labs/Other Studies  Reviewed:    The following studies were reviewed today: ***  EKG:  EKG is *** ordered today.  The ekg ordered today demonstrates ***  Recent Labs: 04/27/2023: ALT 18; BUN 13; Creatinine, Ser 1.01; Hemoglobin 13.8; Magnesium 1.9; Platelets 163; Potassium 4.4; Sodium 141; TSH 4.180  Recent Lipid Panel    Component Value Date/Time   CHOL 125 04/27/2023 1535   TRIG 56 04/27/2023 1535   HDL 57 04/27/2023 1535   CHOLHDL 2.2 04/27/2023 1535   CHOLHDL 3.7 06/29/2022 1417   LDLCALC 56 04/27/2023 1535   LDLCALC 115 (H) 06/29/2022 1417   LDLDIRECT 61 04/27/2023 1535  Risk Assessment/Calculations:   {Does this patient have ATRIAL FIBRILLATION?[phone]}   Physical Exam:    VS:  There were no vitals taken for this visit.    Wt Readings from Last 3 Encounters:  04/26/23 116 lb (52.6 kg)  07/29/22 120 lb (54.4 kg)  06/29/22 120 lb 8 oz (54.7 kg)     GEN: *** Well nourished, well developed in no acute distress HEENT: Normal NECK: No JVD; No carotid bruits LYMPHATICS: No lymphadenopathy CARDIAC: ***RRR, no murmurs, rubs, gallops RESPIRATORY:  Clear to auscultation without rales, wheezing or rhonchi  ABDOMEN: Soft, non-tender, non-distended MUSCULOSKELETAL:  No edema; No deformity  SKIN: Warm and dry NEUROLOGIC:  Alert and oriented x 3 PSYCHIATRIC:  Normal affect   ASSESSMENT:    No diagnosis found. PLAN:    In order of problems listed above:  ***  Disposition: Follow up {follow up:15908} with ***   Shared Decision Making/Informed Consent   {Are you ordering a CV Procedure (e.g. stress test, cath, DCCV, TEE, etc)?   Press F2        :[id number]}    Signed, Elfie Costanza David Stall, PA-C  06/06/2023 10:22 AM    [facility]

## 2023-06-07 ENCOUNTER — Ambulatory Visit: Payer: 59 | Attending: Internal Medicine

## 2023-06-07 DIAGNOSIS — Z5181 Encounter for therapeutic drug level monitoring: Secondary | ICD-10-CM | POA: Diagnosis not present

## 2023-06-07 DIAGNOSIS — I4891 Unspecified atrial fibrillation: Secondary | ICD-10-CM | POA: Diagnosis not present

## 2023-06-07 LAB — POCT INR: INR: 1.3 — AB (ref 2.0–3.0)

## 2023-06-07 NOTE — Patient Instructions (Signed)
 TAKE 3 TABLETS TODAY ONLY THEN CONTINUE TO 2 TABLETS DAILY, EXCEPT 3 TABLETS ON MONDAYS and WEDNESDAYS. Recheck in 4 weeks.

## 2023-06-10 ENCOUNTER — Ambulatory Visit: Payer: 59

## 2023-06-11 ENCOUNTER — Other Ambulatory Visit: Payer: Self-pay | Admitting: Cardiovascular Disease

## 2023-06-13 NOTE — Telephone Encounter (Signed)
 last office visit: 04/26/23 with plan to f/u in 1 month. next office visit: 07/04/23

## 2023-06-14 ENCOUNTER — Ambulatory Visit: Payer: 59 | Admitting: Cardiology

## 2023-06-14 NOTE — Progress Notes (Signed)
 This encounter was created in error - please disregard.

## 2023-06-27 ENCOUNTER — Ambulatory Visit: Payer: 59 | Attending: Medical

## 2023-07-04 ENCOUNTER — Ambulatory Visit: Payer: 59 | Attending: Medical | Admitting: Medical

## 2023-07-04 NOTE — Progress Notes (Deleted)
 Cardiology Office Note:    Date:  07/04/2023   ID:  Felicia Poole, DOB January 12, 1946, MRN [id number]  PCP:  Alba Cory, MD  [facility] Cardiologist:  Julien Nordmann, MD  [facility] Electrophysiologist:  Sherryl Manges, MD   Referring MD: Alba Cory, MD   Chief Complaint: ***  History of Present Illness:    Felicia Poole is a 78 y.o. 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    The patient had CABG x 1 with LIMA to LAD in 2012 [facility] with resection of atrial myxoma.  She underwent nuclear stress test in March 2014 per Dr. Juliann Pares that showed no evidence of ischemia with a normal EF.  Outpatient heart monitor showed frequent episodes of A-fib with RVR with prolonged 3 to 6-second pauses with bradycardia.  She underwent Medtronic PPM placement in September 2014 at [facility].  This is followed by Dr. Graciela Husbands.  She has been on sotalol for rhythm control.  She is on warfarin for A-fib.  Echo in April 2021 showed EF of 35 to 40%, mildly decreased LV function, global hypokinesis, low normal RV SF, moderately enlarged RV, moderately elevated pulmonary artery systolic pressure, estimated RV systolic pressure 50 mmHg, mild to moderately dilated left atrium, mild to moderate MR, severe TR.  The patient was last seen 04/2024 and was overall doing well from a cardiac perspective. She had trace lower leg edema on exam and repeat echo was ordered.   Today Echo has not been done.  Past Medical History:  Diagnosis Date   Atrial myxoma    a. 2012 s/p resection at [facility]   Benign neoplasm of heart    Chronic HFrEF (heart failure with reduced ejection fraction) (HCC)    a. EF: 45% in 2012; b. 01/2013 Echo: EF 45-50%;  c. 09/2019 Echo: EF 35-40%.   Contact dermatitis and other eczema, due to unspecified cause    Contusion of unspecified site     Coronary atherosclerosis of unspecified type of vessel, native or graft    a. 08/2010 s/p CABG x 1 (LIMA->LAD @ time of atrial myxoma resection ([facility]); b. 01/2013 MV: No ischemia/infarct. EF 71%.   Cramp of limb    Depression    Essential hypertension, benign    Heartburn    Hyperlipidemia LDL goal <70    Lumbago    Nonischemic cardiomyopathy (HCC)    a. EF: 45% in 2012; b. 01/2013 Echo: EF 45-50%, mild diff HK. Mild to mod MR. Mild BAE. Mod TR; c. 09/2019 Echo: EF 35-40%, glob HK, mod enlarged RV w/ nl fxn, RVSP , mild-mod LAE, mild-mod MR, sev TR.   Osteoporosis, unspecified    Other specified cardiac dysrhythmias(427.89)    PAF (paroxysmal atrial fibrillation) (HCC)    a. CHA2DS2VASc = 6-->warfarin.  Rhythm management with sotalol.   Postsurgical percutaneous transluminal coronary angioplasty status    Tachy-brady syndrome (HCC)    a. 02/2013 s/p MDT [id number] Advisa DR MRI DC PPM.   Type II or unspecified type diabetes mellitus without mention of complication, not stated as uncontrolled    Unspecified hypothyroidism    Unspecified menopausal and postmenopausal disorder     Past Surgical History:  Procedure Laterality Date   ATRIAL MYXOMA EXCISION Left    CARDIAC CATHETERIZATION  2012   [facility]; right and left heart 75% ostial LAD  lesion   CORONARY ARTERY BYPASS GRAFT  2011   [facility]   INSERT / REPLACE / REMOVE PACEMAKER  02/2013   Dual chamber MDT advisa pacemaker. [id number]   VAGINAL HYSTERECTOMY      Current Medications: No outpatient medications have been marked as taking for the 07/04/23 encounter (Appointment) with Fransico Michael, Satsuki Zillmer H, PA-C.     Allergies:   Patient has no known allergies.   Social History   Socioeconomic History   Marital status: Widowed    Spouse name: Mathis Fare   Number of children: 5   Years of education: Not on file   Highest education level: 9th grade  Occupational History    Employer: DISABLED  Tobacco Use   Smoking status: Never   Smokeless  tobacco: Never   Tobacco comments:    smoking cessation materials not required  Vaping Use   Vaping status: Never Used  Substance and Sexual Activity   Alcohol use: No   Drug use: No   Sexual activity: Never  Other Topics Concern   Not on file  Social History Narrative   Pt lives with son, daughter Bonita Quin, son in Social worker, granddaughter   Social Drivers of Health   Financial Resource Strain: Low Risk  (07/29/2022)   Overall Financial Resource Strain (CARDIA)    Difficulty of Paying Living Expenses: Not hard at all  Food Insecurity: No Food Insecurity (07/29/2022)   Hunger Vital Sign    Worried About Running Out of Food in the Last Year: Never true    Ran Out of Food in the Last Year: Never true  Transportation Needs: No Transportation Needs (07/29/2022)   PRAPARE - Administrator, Civil Service (Medical): No    Lack of Transportation (Non-Medical): No  Physical Activity: Inactive (07/29/2022)   Exercise Vital Sign    Days of Exercise per Week: 0 days    Minutes of Exercise per Session: 0 min  Stress: No Stress Concern Present (07/29/2022)   [employer] of Occupational Health - Occupational Stress Questionnaire    Feeling of Stress : Only a little  Social Connections: Moderately Isolated (07/29/2022)   Social Connection and Isolation Panel [NHANES]    Frequency of Communication with Friends and Family: More than three times a week    Frequency of Social Gatherings with Friends and Family: More than three times a week    Attends Religious Services: More than 4 times per year    Active Member of [employer] or Organizations: No    Attends Banker Meetings: Never    Marital Status: Widowed     Family History: The patient's ***family history includes Cirrhosis in her mother; Heart attack in her father; Heart failure in her mother; Hyperlipidemia in her father; Hypertension in her father; Stroke in her father.  ROS:   Please see the history of present illness.     *** All other systems reviewed and are negative.  EKGs/Labs/Other Studies Reviewed:    The following studies were reviewed today: ***  EKG:  EKG is *** ordered today.  The ekg ordered today demonstrates ***  Recent Labs: 04/27/2023: ALT 18; BUN 13; Creatinine, Ser 1.01; Hemoglobin 13.8; Magnesium 1.9; Platelets 163; Potassium 4.4; Sodium 141; TSH 4.180  Recent Lipid Panel    Component Value Date/Time   CHOL 125 04/27/2023 1535   TRIG 56 04/27/2023 1535   HDL 57 04/27/2023 1535   CHOLHDL 2.2 04/27/2023 1535   CHOLHDL 3.7 06/29/2022 1417   LDLCALC 56 04/27/2023  1535   LDLCALC 115 (H) 06/29/2022 1417   LDLDIRECT 61 04/27/2023 1535     Risk Assessment/Calculations:   {Does this patient have ATRIAL FIBRILLATION?:[phone]}   Physical Exam:    VS:  There were no vitals taken for this visit.    Wt Readings from Last 3 Encounters:  04/26/23 116 lb (52.6 kg)  07/29/22 120 lb (54.4 kg)  06/29/22 120 lb 8 oz (54.7 kg)     GEN: *** Well nourished, well developed in no acute distress HEENT: Normal NECK: No JVD; No carotid bruits LYMPHATICS: No lymphadenopathy CARDIAC: ***RRR, no murmurs, rubs, gallops RESPIRATORY:  Clear to auscultation without rales, wheezing or rhonchi  ABDOMEN: Soft, non-tender, non-distended MUSCULOSKELETAL:  No edema; No deformity  SKIN: Warm and dry NEUROLOGIC:  Alert and oriented x 3 PSYCHIATRIC:  Normal affect   ASSESSMENT:    No diagnosis found. PLAN:    In order of problems listed above:  ***  Disposition: Follow up {follow up:15908} with ***   Shared Decision Making/Informed Consent   {Are you ordering a CV Procedure (e.g. stress test, cath, DCCV, TEE, etc)?   Press F2        :[id number]}    Signed, Damonta Cossey Ardelle Lesches  07/04/2023 12:34 PM    [facility]

## 2023-07-06 ENCOUNTER — Ambulatory Visit: Payer: 59 | Attending: Internal Medicine

## 2023-07-06 DIAGNOSIS — Z5181 Encounter for therapeutic drug level monitoring: Secondary | ICD-10-CM | POA: Diagnosis not present

## 2023-07-06 DIAGNOSIS — I4891 Unspecified atrial fibrillation: Secondary | ICD-10-CM

## 2023-07-06 LAB — POCT INR: INR: 1.2 — AB (ref 2.0–3.0)

## 2023-07-06 NOTE — Patient Instructions (Signed)
TAKE 4 TABLETS TODAY ONLY THEN INCREASE TO 2 TABLETS DAILY, EXCEPT 3 TABLETS ON MONDAYS, WEDNESDAYS and FRIDAYS. Recheck in 3 weeks.

## 2023-07-07 ENCOUNTER — Ambulatory Visit: Payer: 59 | Admitting: Cardiology

## 2023-07-13 ENCOUNTER — Ambulatory Visit: Payer: 59 | Attending: Medical

## 2023-07-26 ENCOUNTER — Encounter: Payer: Self-pay | Admitting: Medical

## 2023-07-26 ENCOUNTER — Ambulatory Visit: Payer: 59 | Attending: Medical | Admitting: Medical

## 2023-07-26 VITALS — BP 115/72 | HR 100 | Ht 59.0 in | Wt 119.6 lb

## 2023-07-26 DIAGNOSIS — I428 Other cardiomyopathies: Secondary | ICD-10-CM

## 2023-07-26 DIAGNOSIS — I4891 Unspecified atrial fibrillation: Secondary | ICD-10-CM

## 2023-07-26 DIAGNOSIS — I2510 Atherosclerotic heart disease of native coronary artery without angina pectoris: Secondary | ICD-10-CM

## 2023-07-26 DIAGNOSIS — I495 Sick sinus syndrome: Secondary | ICD-10-CM | POA: Diagnosis not present

## 2023-07-26 DIAGNOSIS — Z79899 Other long term (current) drug therapy: Secondary | ICD-10-CM | POA: Diagnosis not present

## 2023-07-26 DIAGNOSIS — I071 Rheumatic tricuspid insufficiency: Secondary | ICD-10-CM | POA: Diagnosis not present

## 2023-07-26 DIAGNOSIS — E782 Mixed hyperlipidemia: Secondary | ICD-10-CM | POA: Diagnosis not present

## 2023-07-26 MED ORDER — SPIRONOLACTONE 25 MG PO TABS: 12.5000 mg | ORAL_TABLET | Freq: Every day | ORAL | 3 refills | Status: AC

## 2023-07-26 MED ORDER — FUROSEMIDE 20 MG PO TABS: 20.0000 mg | ORAL_TABLET | ORAL | 3 refills | Status: AC

## 2023-07-26 NOTE — Patient Instructions (Signed)
 Medication Instructions:  Your physician recommends the following medication changes.  START TAKING: Spironolactone 12.5 mg by mouth daily  DECREASE: Lasix to 20 mg by mouth every other day  *If you need a refill on your cardiac medications before your next appointment, please call your pharmacy*   Lab Work: Your provider would like for you to return in 2 weeks to have the following labs drawn: (BMP).   Please go to [facility] [address] ([facility] [address], [place] [address] You do not need an appointment.  They are open from 8 am- 4:30 pm.  Lunch from 1:00 pm- 2:00 pm You will not need to be fasting.   Testing/Procedures: No test ordered today (Please schedule ECHO previously ordered)   Follow-Up: At [facility], you and your health needs are our priority.  As part of our continuing mission to provide you with exceptional heart care, we have created designated Provider Care Teams.  These Care Teams include your primary Cardiologist (physician) and Advanced Practice Providers (APPs -  Physician Assistants and Nurse Practitioners) who all work together to provide you with the care you need, when you need it.  We recommend signing up for the patient portal called "MyChart".  Sign up information is provided on this After Visit Summary.  MyChart is used to connect with patients for Virtual Visits (Telemedicine).  Patients are able to view lab/test results, encounter notes, upcoming appointments, etc.  Non-urgent messages can be sent to your provider as well.   To learn more about what you can do with MyChart, go to [email or web address].    Your next appointment:   3 month(s)  Provider:   You may see Julien Nordmann, MD or one of the following Advanced Practice Providers on your designated Care Team:   Nicolasa Ducking, NP Eula Listen, PA-C Cadence Fransico Michael, PA-C Charlsie Quest, NP Carlos Levering, NP    Your physician recommends that you  schedule a follow-up appointment with Dr. Graciela Husbands

## 2023-07-26 NOTE — Progress Notes (Unsigned)
 Cardiology Office Note:  .   Date:  07/27/2023  ID:  [name], DOB [birth year], MRN [id number] PCP: Alba Cory, MD  [facility] Providers Cardiologist:  Julien Nordmann, MD Electrophysiologist:  Sherryl Manges, MD     History of Present Illness: Marland Kitchen   Felicia Poole is a 78 y.o. 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    The patient underwent CABG x 1 with LIMA to LAD in 2012 [facility] with resection of atrial myxoma.  She underwent nuclear stress test in March 2014 per Dr. Juliann Pares that showed no evidence of ischemia with a normal EF.  Outpatient heart monitor showed frequent episodes of A-fib with RVR with prolonged 3 to 6-second pauses with bradycardia.  She underwent Medtronic PPM placement in September 2014 at [facility].  This is followed by Dr. Graciela Husbands.  She has been on sotalol for rhythm control.  She is on warfarin for A-fib.  Echo in April 2021 showed EF of 35 to 40%, mildly decreased LV function, global hypokinesis, low normal RV SF, moderately enlarged RV, moderately elevated pulmonary artery systolic pressure, estimated RV systolic pressure 50 mmHg, mild to moderately dilated left atrium, mild to moderate MR, severe TR.  The patient was last seen 04/2023 and was overall doing well. She was referred to EP for Sotalol maintenance, but appears she never went. Echo was ordered, but was not performed. She was started on Entresto.   Today, the patient reports she is needing refill of lasix. She has not had lasix for a couple months. No chest pain or SOB. She is also needing a refill of Entresto. She has not had the echo yet and has not seen EP. She has mild lower leg edema on exam.    Studies Reviewed: Marland Kitchen   EKG Interpretation Date/Time:  Tuesday July 26 2023 15:53:43 EST Ventricular Rate:  100 PR Interval:  256 QRS  Duration:  76 QT Interval:  348 QTC Calculation: 448 R Axis:   44  Text Interpretation: Sinus rhythm with 1st degree A-V block with occasional ventricular-paced complexes T wave abnormality, consider inferior ischemia T wave abnormality, consider anterolateral ischemia When compared with ECG of 26-Apr-2023 15:22, PR interval has increased Vent. rate has increased BY  35 BPM Confirmed by Fransico Michael, Cambrey Lupi ([id number]) on 07/26/2023 3:57:02 PM      Inclinic device check 02/2020 Pacemaker check in clinic. Normal device function. Thresholds, sensing, impedances consistent with previous measurements. Device programmed to maximize longevity. 18 high ventricular rates noted. Device programmed at appropriate safety margins. Histogram   distribution appropriate for patient activity level. Device programmed to optimize intrinsic conduction. Estimated longevity 2.5years. Patient continues to decline remote follow-up, ROV with Dr. Graciela Husbands in 6 months. Patient education completed.Judy Pimple,   BSN, RN    Echo [phone]  1. Left ventricular ejection fraction, by estimation, is 35 to 40%. The  left ventricle has moderately decreased function. The left ventricle  demonstrates global hypokinesis. Left ventricular diastolic parameters are  indeterminate.   2. Right ventricular systolic function is low normal. The right  ventricular size is moderately enlarged. There is moderately elevated  pulmonary artery systolic pressure. The estimated right ventricular  systolic pressure is 50.0 mmHg.   3. Left atrial size was mild to moderately dilated.   4. Mild to moderate mitral valve regurgitation.   5.  Tricuspid valve regurgitation is severe.   6. The inferior vena cava is dilated in size with <50% respiratory  variability, suggesting right atrial pressure of 15 mmHg.       Physical Exam:   VS:  BP 115/72 (BP Location: Left Arm, Patient Position: Sitting, Cuff Size: Normal)   Pulse 100   Ht 4\' 11"  (1.499 m)   Wt 119 lb 9.6  oz (54.3 kg)   SpO2 97%   BMI 24.16 kg/m    Wt Readings from Last 3 Encounters:  07/26/23 119 lb 9.6 oz (54.3 kg)  04/26/23 116 lb (52.6 kg)  07/29/22 120 lb (54.4 kg)    GEN: Well nourished, well developed in no acute distress NECK: No JVD; No carotid bruits CARDIAC: RRR, no murmurs, rubs, gallops RESPIRATORY:  Clear to auscultation without rales, wheezing or rhonchi  ABDOMEN: Soft, non-tender, non-distended EXTREMITIES:  mild lower leg edema; No deformity   ASSESSMENT AND PLAN: .    CAD s/p CABG x 1 She denies anginal symptoms. Continue Lipitor. No ASA given coumadin.  Paroxysmal Afib.  EKG shows NSR and V paced rythym. She is in Afib on exam. I will refer to EP again. She is on Sotolol 80mg  BID. Most recent INR 1.2. She follows with coumadin clinic.   Tachybrady syndrome s/p PPM Last device check was in 2021, she has declined remote follow-up in the apst. Refer to EP as above.   NICM Severe TR Echo in 2021 showed LVEF 35-40%, low normal RVSF, moderately dilated LA, mild to mod MR, severe TR. She has mild lower leg edema on exam, but has not been on lasix for a couple months. I will start spiro 12.5mg  daily and refill lasix 20mg  every other day. Echo was ordered, but not performed- we will check on this. Continue Entresto 24-26mg  BID, lasix 20mg  every other day and spironolactone 12.5mg  daily.   HLD LDL 56. Continue Lipitor 80mg  daily.     Dispo: Follow-up in 1 month  Signed, Idali Lafever David Stall, PA-C

## 2023-07-27 ENCOUNTER — Ambulatory Visit: Payer: 59

## 2023-08-10 ENCOUNTER — Ambulatory Visit: Payer: 59

## 2023-08-17 ENCOUNTER — Ambulatory Visit: Attending: Internal Medicine

## 2023-08-17 DIAGNOSIS — Z5181 Encounter for therapeutic drug level monitoring: Secondary | ICD-10-CM

## 2023-08-17 DIAGNOSIS — I4891 Unspecified atrial fibrillation: Secondary | ICD-10-CM | POA: Diagnosis not present

## 2023-08-17 LAB — POCT INR: INR: 1.2 — AB (ref 2.0–3.0)

## 2023-08-17 MED ORDER — WARFARIN SODIUM 2.5 MG PO TABS: ORAL_TABLET | ORAL | 5 refills | Status: DC

## 2023-08-17 NOTE — Patient Instructions (Signed)
 TAKE 4 TABLETS TODAY ONLY Continue 2 TABLETS DAILY, EXCEPT 3 TABLETS ON MONDAYS, WEDNESDAYS and FRIDAYS. Recheck in 2 weeks.

## 2023-08-31 ENCOUNTER — Ambulatory Visit
Admission: RE | Admit: 2023-08-31 | Discharge: 2023-08-31 | Disposition: A | Discharge status: Home / Self Care | Source: Ambulatory Visit | Attending: Cardiology | Admitting: Cardiology

## 2023-08-31 ENCOUNTER — Ambulatory Visit: Payer: 59 | Attending: Cardiology | Admitting: Cardiology

## 2023-08-31 ENCOUNTER — Encounter: Payer: Self-pay | Admitting: Cardiology

## 2023-08-31 ENCOUNTER — Telehealth: Payer: Self-pay

## 2023-08-31 ENCOUNTER — Ambulatory Visit (INDEPENDENT_AMBULATORY_CARE_PROVIDER_SITE_OTHER)

## 2023-08-31 VITALS — BP 162/90 | HR 81 | Ht <= 58 in | Wt 116.6 lb

## 2023-08-31 DIAGNOSIS — Z5181 Encounter for therapeutic drug level monitoring: Secondary | ICD-10-CM | POA: Diagnosis not present

## 2023-08-31 DIAGNOSIS — Z79899 Other long term (current) drug therapy: Secondary | ICD-10-CM | POA: Diagnosis present

## 2023-08-31 DIAGNOSIS — I4891 Unspecified atrial fibrillation: Secondary | ICD-10-CM | POA: Insufficient documentation

## 2023-08-31 DIAGNOSIS — Z45018 Encounter for adjustment and management of other part of cardiac pacemaker: Secondary | ICD-10-CM

## 2023-08-31 LAB — POCT INR: INR: 1.7 — AB (ref 2.0–3.0)

## 2023-08-31 NOTE — Telephone Encounter (Signed)
 Pt scheduled for generator change on 09/26/23. Note placed on upcoming coumadin clinic appt on 09/14/23.

## 2023-08-31 NOTE — Telephone Encounter (Signed)
-----   Message from Nurse Kinnie Feil C sent at 08/31/2023  4:11 PM EDT ----- Patient is scheduled for a pacemaker generator change on 4/21 with Dr. Lalla Brothers.   Patient is on coumadin.   Routine follow up  Thanks! Felicia Poole

## 2023-08-31 NOTE — Patient Instructions (Addendum)
 Medication Instructions:  Your physician has recommended you make the following change in your medication:  1) STOP taking sotalol  *If you need a refill on your cardiac medications before your next appointment, please call your pharmacy*  Testing/Procedures: Chest X-Ray - please go to the [facility] to have this done today  A chest x-ray takes a picture of the organs and structures inside the chest, including the heart, lungs, and blood vessels. This test can show several things, including, whether the heart is enlarges; whether fluid is building up in the lungs; and whether pacemaker / defibrillator leads are still in place.  Echocardiogram - April 7th at [facility] Your physician has requested that you have an echocardiogram. Echocardiography is a painless test that uses sound waves to create images of your heart. It provides your doctor with information about the size and shape of your heart and how well your heart's chambers and valves are working. This procedure takes approximately one hour. There are no restrictions for this procedure. Please do NOT wear cologne, perfume, aftershave, or lotions (deodorant is allowed). Please arrive 15 minutes prior to your appointment time.  Pacemaker Generator Change  You are scheduled for PPM Generator Change on Monday, April 21 with Dr. Steffanie Dunn.Please arrive at the Main Entrance A at [facility]: [address] [place], [place] [address] at 1:00 PM     Follow-Up: At [facility], you and your health needs are our priority.  As part of our continuing mission to provide you with exceptional heart care, we have created designated Provider Care Teams.  These Care Teams include your primary Cardiologist (physician) and Advanced Practice Providers (APPs -  Physician Assistants and Nurse Practitioners) who all work together to provide you with the care you need, when you need it.  Your next appointment:   4/9 at 4:20pm  Provider:    Steffanie Dunn, MD

## 2023-08-31 NOTE — Patient Instructions (Signed)
 Description   TAKE 4 TABLETS TODAY ONLY Continue 2 TABLETS DAILY, EXCEPT 3 TABLETS ON MONDAYS, WEDNESDAYS and FRIDAYS. Pending pacemaker generator change out on 09/26/23. Recheck in 2 weeks.

## 2023-08-31 NOTE — Progress Notes (Addendum)
 Electrophysiology Office Note:    Date:  08/31/2023   ID:  Felicia Poole, DOB 15-Dec-1945, MRN [id number]  [facility] Cardiologist:  Julien Nordmann, MD  [facility] Electrophysiologist:  Lanier Prude, MD   Referring MD: Alba Cory, MD   Chief Complaint: Tachycardia-bradycardia syndrome and permanent pacemaker  History of Present Illness:    Felicia Poole is a 78 year old woman who I am seeing today for an evaluation of tachybradycardia syndrome and permanent pacemaker in situ at the request of Cadence.  The patient last saw Cadence July 26, 2023.  The patient has a history of coronary disease with a prior CABG, atrial myxoma resection in 2012 and chronic systolic heart failure with an EF of 35 to 40%.  She also has atrial fibrillation on Coumadin.  Her pacemaker was implanted in September 2014.  She was previously followed at [facility] clinic.  She was previously on sotalol for rhythm control.  She was seen by Dr. Graciela Husbands in the past.  In the last clinic note with cadence, she reports the last remote interrogation was in 2021.  She is with her daughter today in clinic.  She has not been seen in our clinic for her pacemaker care in 4 years.  She reports taking her Coumadin although her INRs have been around 1 on the last few checks.  The patient is a poor historian.    Their past medical, social and family history was reviewed.   ROS:   Please see the history of present illness.    All other systems reviewed and are negative.  EKGs/Labs/Other Studies Reviewed:    The following studies were reviewed today:  March 06, 2020 in clinic device interrogation showed longevity of 2.5 years  April 26, 2023 EKG shows atrial fibrillation and ventricular pacing  July 26, 2023 EKG shows intermittent ventricular pacing, atrial fibrillation  August 31, 2023 in clinic device interrogation personally reviewed Device reached end of service August 27, 2022 Atrial  pacing changed abruptly several months ago Presenting EGM shows simultaneous atrial and ventricular EGM's.  This is at significant difference from last device interrogation from 2021. I did not run any ventricular or atrial lead diagnostics today given battery status. Currently programmed VVI 65 because of EOS She has many episodes of nonsustained VT.       Physical Exam:    VS:  BP (!) 162/90   Pulse 81   Ht 4\' 8"  (1.422 m)   Wt 116 lb 9.6 oz (52.9 kg)   SpO2 91%   BMI 26.14 kg/m     Wt Readings from Last 3 Encounters:  08/31/23 116 lb 9.6 oz (52.9 kg)  07/26/23 119 lb 9.6 oz (54.3 kg)  04/26/23 116 lb (52.6 kg)     GEN: no distress.  Elderly. CARD: RRR, No MRG.  Pacemaker on left chest.  Some superficial collaterals visible.  Incision is well-healed. RESP: No IWOB. CTAB.        ASSESSMENT AND PLAN:    1. Atrial fibrillation, unspecified type (HCC)   2. Encounter for long-term (current) use of high-risk medication     #Persistent atrial fibrillation #High risk med monitoring-sotalol She was previously prescribed sotalol but she continues to experience atrial fibrillation.  I am also concerned about her medication regimen compliance.  She also has a reduced ejection fraction.  For these reasons, I do not think sotalol is a reasonable choice for her moving forward.  I am to have her stop this medication.  If she  has trouble with rhythm/rate control in the future, could consider alternative antiarrhythmic such as amiodarone.   Continue warfarin for stroke prophylaxis although again I am concerned that she is not taking this medication given INRs on the last few checks.  #Permanent pacemaker in situ Battery is at end of service since August 27, 2022.  I do not have any documentation of lead position.  EGM's on today's check shows simultaneous atrial and ventricular electrograms.  I will get a two-view chest x-ray today.   #Chronic systolic heart failure #NSVT NYHA  class II.  Has seen Cadence a couple of times in clinic.  Follow-up seems to be a challenge and is complicating the management of her heart failure. Need to update echocardiogram to reassess LV function Continue spironolactone, Entresto, Lasix The patient is also having runs of nonsustained VT.  Is unclear to me whether or not this is related to a lead displacement or true arrhythmia.  She does have coronary history making it possible that this is related to myocardial scar.  She is a poor candidate for upgrade to ICD given her overall health status.  Would recommend medical therapy for this.  Follow-up with me in about 2 weeks after her chest x-ray and echo.  Tentatively planning for generator replacement in 3 to 4 weeks.   Signed, Rossie Muskrat. Lalla Brothers, MD, [facility], [facility] 08/31/2023 3:21 PM    Electrophysiology [facility]

## 2023-09-03 ENCOUNTER — Other Ambulatory Visit: Payer: Self-pay | Admitting: Cardiovascular Disease

## 2023-09-12 ENCOUNTER — Ambulatory Visit: Attending: Medical

## 2023-09-12 DIAGNOSIS — I428 Other cardiomyopathies: Secondary | ICD-10-CM

## 2023-09-12 LAB — ECHOCARDIOGRAM COMPLETE
AR max vel: 1.45 cm2
AV Area VTI: 1.4 cm2
AV Area mean vel: 1.36 cm2
AV Mean grad: 2 mmHg
AV Peak grad: 4 mmHg
Ao pk vel: 1 m/s
Area-P 1/2: 4.49 cm2
S' Lateral: 2.7 cm

## 2023-09-13 LAB — CUP PACEART INCLINIC DEVICE CHECK
Date Time Interrogation Session: 20250326074550
Implantable Lead Connection Status: 753985
Implantable Lead Connection Status: 753985
Implantable Lead Implant Date: 20140919
Implantable Lead Implant Date: 20140919
Implantable Lead Location: 753859
Implantable Lead Location: 753860
Implantable Pulse Generator Implant Date: 20140919

## 2023-09-13 NOTE — Progress Notes (Unsigned)
  Electrophysiology Office Follow up Visit Note:    Date:  09/14/2023   ID:  Felicia Poole, DOB Oct 20, 1945, MRN [id number]  PCP:  Felicia Cory, MD  [facility] Cardiologist:  Julien Nordmann, MD  [facility] Electrophysiologist:  Lanier Prude, MD    Interval History:     Felicia Poole is a 78 y.o. female who presents for a follow up visit.   I met her 08/31/2023 to establish care for her pacemaker. At that appointment, Sotalol was discontinued. An echo and CXR were ordered.   Her pacemaker was at EOS since August 27, 2022.   She is with her daughter again in clinic today.  Her INR today is 1.7.           Past medical, surgical, social and family history were reviewed.  ROS:   Please see the history of present illness.    All other systems reviewed and are negative.  EKGs/Labs/Other Studies Reviewed:    The following studies were reviewed today:  Prior device interrogation personally reviewed VVI 18 with intact VA conduction  08/31/2023 CXR    12/02/2021 ECG shows clear atrial pacing with intact AV conduction   07/26/2023 ECG shows probable AF with intermittent V pacing   04/26/2023 ECG shows V pacing with probable VA conduction    EKG Interpretation Date/Time:  Wednesday September 14 2023 15:50:52 EDT Ventricular Rate:  65 PR Interval:    QRS Duration:  148 QT Interval:  464 QTC Calculation: 482 R Axis:   -88  Text Interpretation: Ventricular-paced rhythm Confirmed by Steffanie Dunn [phone]) on 09/14/2023 3:55:30 PM    Physical Exam:    VS:  BP 138/60   Pulse 65   Ht 4\' 8"  (1.422 m)   Wt 120 lb 6.4 oz (54.6 kg)   SpO2 97%   BMI 26.99 kg/m     Wt Readings from Last 3 Encounters:  09/14/23 120 lb 6.4 oz (54.6 kg)  08/31/23 116 lb 9.6 oz (52.9 kg)  07/26/23 119 lb 9.6 oz (54.3 kg)     GEN: no distress CARD: RRR, No MRG. CIED pocket well healed. RESP: No IWOB. CTAB.      ASSESSMENT:    1. Tachycardia-bradycardia [facility])     PLAN:    In order of problems listed above:  #CIED in situ #PPM at EOS She has a DDD PPM. Leads appear to be in appropriate position.  She will require generator replacement with possible CRT upgrade.  Risks, benefits, and alternatives to pulse generator replacement and possible CRT upgrade were discussed in detail today.  The patient understands that risks include but are not limited to bleeding, infection, pneumothorax, perforation, tamponade, vascular damage, renal failure, MI, stroke, death, inappropriate shocks, damage to his existing leads, and lead dislodgement and wishes to proceed.  We will therefore schedule the procedure at the next available time.  Stop Warfarin 5 days prior to the procedure.  While her EF is low, I do not think she is an appropriate candidate for ICD upgrade. We discussed that during today's visit. Her intrinsic QRS is narrow.  If her AV conduction is not intact, will need to upgrade to CRT given reduced EF.      Signed, Steffanie Dunn, MD, [facility], [facility] 09/14/2023 4:12 PM    Electrophysiology [facility]

## 2023-09-13 NOTE — H&P (View-Only) (Signed)
  Electrophysiology Office Follow up Visit Note:    Date:  09/14/2023   ID:  Ernesto Heady, DOB 02-28-1946, MRN [id number]  PCP:  Arleen Lacer, MD  [facility] Cardiologist:  Belva Boyden, MD  [facility] Electrophysiologist:  Boyce Byes, MD    Interval History:     Felicia Poole is a 78 y.o. female who presents for a follow up visit.   I met her 08/31/2023 to establish care for her pacemaker. At that appointment, Sotalol  was discontinued. An echo and CXR were ordered.   Her pacemaker was at EOS since August 27, 2022.   She is with her daughter again in clinic today.  Her INR today is 1.7.           Past medical, surgical, social and family history were reviewed.  ROS:   Please see the history of present illness.    All other systems reviewed and are negative.  EKGs/Labs/Other Studies Reviewed:    The following studies were reviewed today:  Prior device interrogation personally reviewed VVI 66 with intact VA conduction  08/31/2023 CXR    12/02/2021 ECG shows clear atrial pacing with intact AV conduction   07/26/2023 ECG shows probable AF with intermittent V pacing   04/26/2023 ECG shows V pacing with probable VA conduction    EKG Interpretation Date/Time:  Wednesday September 14 2023 15:50:52 EDT Ventricular Rate:  65 PR Interval:    QRS Duration:  148 QT Interval:  464 QTC Calculation: 482 R Axis:   -88  Text Interpretation: Ventricular-paced rhythm Confirmed by Harvie Liner [phone]) on 09/14/2023 3:55:30 PM    Physical Exam:    VS:  BP 138/60   Pulse 65   Ht 4\' 8"  (1.422 m)   Wt 120 lb 6.4 oz (54.6 kg)   SpO2 97%   BMI 26.99 kg/m     Wt Readings from Last 3 Encounters:  09/14/23 120 lb 6.4 oz (54.6 kg)  08/31/23 116 lb 9.6 oz (52.9 kg)  07/26/23 119 lb 9.6 oz (54.3 kg)     GEN: no distress CARD: RRR, No MRG. CIED pocket well healed. RESP: No IWOB. CTAB.      ASSESSMENT:    1. 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 Leads appear to be in appropriate position.  She will require generator replacement with possible CRT upgrade.  Risks, benefits, and alternatives to pulse generator replacement and possible CRT upgrade were discussed in detail today.  The patient understands that risks include but are not limited to bleeding, infection, pneumothorax, perforation, tamponade, vascular damage, renal failure, MI, stroke, death, inappropriate shocks, damage to his existing leads, and lead dislodgement and wishes to proceed.  We will therefore schedule the procedure at the next available time.  Stop Warfarin 5 days prior to the procedure.  While her EF is low, I do not think she is an appropriate candidate for ICD upgrade. We discussed that during today's visit. Her intrinsic QRS is narrow.  If her AV conduction is not intact, will need to upgrade to CRT given reduced EF.      Signed, Harvie Liner, MD, [facility], [facility] 09/14/2023 4:12 PM    Electrophysiology [facility]

## 2023-09-14 ENCOUNTER — Ambulatory Visit (INDEPENDENT_AMBULATORY_CARE_PROVIDER_SITE_OTHER)

## 2023-09-14 ENCOUNTER — Other Ambulatory Visit: Payer: Self-pay | Admitting: Cardiology

## 2023-09-14 ENCOUNTER — Other Ambulatory Visit: Payer: Self-pay

## 2023-09-14 ENCOUNTER — Encounter: Payer: Self-pay | Admitting: Cardiology

## 2023-09-14 ENCOUNTER — Ambulatory Visit: Attending: Cardiology | Admitting: Cardiology

## 2023-09-14 VITALS — BP 138/60 | HR 65 | Ht <= 58 in | Wt 120.4 lb

## 2023-09-14 DIAGNOSIS — I495 Sick sinus syndrome: Secondary | ICD-10-CM

## 2023-09-14 DIAGNOSIS — I4891 Unspecified atrial fibrillation: Secondary | ICD-10-CM | POA: Diagnosis not present

## 2023-09-14 DIAGNOSIS — Z5181 Encounter for therapeutic drug level monitoring: Secondary | ICD-10-CM

## 2023-09-14 LAB — POCT INR: INR: 1.7 — AB (ref 2.0–3.0)

## 2023-09-14 NOTE — Patient Instructions (Signed)
 TAKE 4 TABLETS TODAY ONLY then Increase to 3 TABLETS DAILY, EXCEPT 2 TABLETS ON MONDAYS, WEDNESDAYS and FRIDAYS. Pending pacemaker generator change out on 09/26/23. Recheck in 1 week.

## 2023-09-14 NOTE — Patient Instructions (Addendum)
 Medication Instructions:  Your physician recommends that you continue on your current medications as directed. Please refer to the Current Medication list given to you today.  *If you need a refill on your cardiac medications before your next appointment, please call your pharmacy*  Lab Work: Your provider would like for you to return prior to your procedure to have the following labs drawn: BMET and CBC.   Please go to [facility] [address] [address] [facility], [place] [address] You do not need an appointment.  They are open from 8 am- 4:30 pm.  Lunch from 1:00 pm- 2:00 pm You do NOT need to be fasting.   Testing/Procedures: Pacemaker Generator Change on Friday, May 9th - please see instruction letter given to you today  Follow-Up: At [facility], you and your health needs are our priority.  As part of our continuing mission to provide you with exceptional heart care, our providers are all part of one team.  This team includes your primary Cardiologist (physician) and Advanced Practice Providers or APPs (Physician Assistants and Nurse Practitioners) who all work together to provide you with the care you need, when you need it.  Your next appointment:   We will call you to schedule your follow up appointment

## 2023-09-16 ENCOUNTER — Other Ambulatory Visit: Payer: Self-pay

## 2023-09-16 DIAGNOSIS — Z79899 Other long term (current) drug therapy: Secondary | ICD-10-CM

## 2023-09-16 MED ORDER — DAPAGLIFLOZIN PROPANEDIOL 10 MG PO TABS: 10.0000 mg | ORAL_TABLET | Freq: Every day | ORAL | 3 refills | Status: AC

## 2023-09-19 ENCOUNTER — Telehealth: Payer: Self-pay

## 2023-09-19 NOTE — Telephone Encounter (Signed)
 Called to remind her to have her labs done prior to her procedure on 5/9 with Dr. Marven Slimmer. She has an appt at the [facility] office on Wednesday 4/16 and will have them done that day.

## 2023-09-21 ENCOUNTER — Ambulatory Visit

## 2023-09-22 ENCOUNTER — Telehealth (HOSPITAL_COMMUNITY): Payer: Self-pay

## 2023-09-22 NOTE — Telephone Encounter (Signed)
 Spoke with patient's daughter Felicia Poole to complete pre-procedure call/DPR on file.     New medical conditions?  No Recent hospitalizations or surgeries? No Started any new medications? No Patient made aware to contact office to inform of any new medications started. Any changes in activities of daily living? No  Pre-procedure testing scheduled: lab work ordered  Updated medications to hold: Farxiga for 3 days, last dose on 10/10/23, Warfarin for 5 days, Furosemide AM of procedure.  Confirmed patient is scheduled for  PPM generator change on Friday, May 9 with Dr. Harvie Liner. Instructed patient to arrive at the Main Entrance A at [facility]: [address] [place], [place] [address] and check in at Admitting at 12:00 PM.  Advised of plan to go home the same day and will only stay overnight if medically necessary. You MUST have a responsible adult to drive you home and MUST be with you the first 24 hours after you arrive home or your procedure could be cancelled.  Felicia Poole verbalized understanding to information provided and is agreeable to proceed with procedure.

## 2023-09-23 ENCOUNTER — Other Ambulatory Visit
Admission: RE | Admit: 2023-09-23 | Discharge: 2023-09-23 | Disposition: A | Discharge status: Home / Self Care | Attending: Cardiology | Admitting: Cardiology

## 2023-09-23 DIAGNOSIS — Z79899 Other long term (current) drug therapy: Secondary | ICD-10-CM | POA: Insufficient documentation

## 2023-09-23 DIAGNOSIS — I495 Sick sinus syndrome: Secondary | ICD-10-CM | POA: Diagnosis present

## 2023-09-23 LAB — BASIC METABOLIC PANEL WITH GFR
Anion gap: 8 (ref 5–15)
BUN: 15 mg/dL (ref 8–23)
CO2: 22 mmol/L (ref 22–32)
Calcium: 8.8 mg/dL — ABNORMAL LOW (ref 8.9–10.3)
Chloride: 108 mmol/L (ref 98–111)
Creatinine, Ser: 0.84 mg/dL (ref 0.44–1.00)
GFR, Estimated: >60 mL/min (ref >60)
Glucose, Bld: 86 mg/dL (ref 70–99)
Potassium: 4.2 mmol/L (ref 3.5–5.1)
Sodium: 138 mmol/L (ref 135–145)

## 2023-09-23 LAB — CBC WITH DIFFERENTIAL/PLATELET
Abs Immature Granulocytes: 0.01 10*3/uL (ref 0.00–0.07)
Basophils Absolute: 0 10*3/uL (ref 0.0–0.1)
Basophils Relative: 1 %
Eosinophils Absolute: 0.1 10*3/uL (ref 0.0–0.5)
Eosinophils Relative: 3 %
HCT: 44.1 % (ref 36.0–46.0)
Hemoglobin: 14.4 g/dL (ref 12.0–15.0)
Immature Granulocytes: 0 %
Lymphocytes Relative: 33 %
Lymphs Abs: 1.1 10*3/uL (ref 0.7–4.0)
MCH: 32 pg (ref 26.0–34.0)
MCHC: 32.7 g/dL (ref 30.0–36.0)
MCV: 98 fL (ref 80.0–100.0)
Monocytes Absolute: 0.2 10*3/uL (ref 0.1–1.0)
Monocytes Relative: 5 %
Neutro Abs: 2 10*3/uL (ref 1.7–7.7)
Neutrophils Relative %: 58 %
Platelets: 144 10*3/uL — ABNORMAL LOW (ref 150–400)
RBC: 4.5 MIL/uL (ref 3.87–5.11)
RDW: 12.6 % (ref 11.5–15.5)
WBC: 3.4 10*3/uL — ABNORMAL LOW (ref 4.0–10.5)
nRBC: 0 % (ref 0.0–0.2)

## 2023-09-28 ENCOUNTER — Ambulatory Visit: Attending: Internal Medicine

## 2023-09-28 DIAGNOSIS — I4891 Unspecified atrial fibrillation: Secondary | ICD-10-CM

## 2023-09-28 DIAGNOSIS — Z5181 Encounter for therapeutic drug level monitoring: Secondary | ICD-10-CM

## 2023-09-28 LAB — POCT INR: INR: 1.1 — AB (ref 2.0–3.0)

## 2023-09-28 NOTE — Patient Instructions (Signed)
 Take 4 tablets tonight then continue 3 TABLETS DAILY, EXCEPT 2 TABLETS ON MONDAYS, WEDNESDAYS and FRIDAYS. Generator change 5/9 Hold 5/4-8  Take 2.5 tablets on 5/9 and 3.5 tablets on 5/10. Recheck in 4 weeks.  Post generator change 5/9

## 2023-10-07 ENCOUNTER — Telehealth (HOSPITAL_COMMUNITY): Payer: Self-pay

## 2023-10-07 NOTE — Telephone Encounter (Signed)
 Attempted to reach patient/daughter Stana Ear, to discuss upcoming procedure, no answer. Left VM to return call.

## 2023-10-11 ENCOUNTER — Encounter: Payer: Self-pay | Admitting: Medical

## 2023-10-11 ENCOUNTER — Ambulatory Visit: Attending: Medical | Admitting: Medical

## 2023-10-11 VITALS — BP 156/116 | HR 106 | Ht <= 58 in | Wt 116.6 lb

## 2023-10-11 DIAGNOSIS — I495 Sick sinus syndrome: Secondary | ICD-10-CM | POA: Diagnosis not present

## 2023-10-11 DIAGNOSIS — I4891 Unspecified atrial fibrillation: Secondary | ICD-10-CM | POA: Diagnosis not present

## 2023-10-11 DIAGNOSIS — I10 Essential (primary) hypertension: Secondary | ICD-10-CM

## 2023-10-11 DIAGNOSIS — I428 Other cardiomyopathies: Secondary | ICD-10-CM

## 2023-10-11 DIAGNOSIS — E782 Mixed hyperlipidemia: Secondary | ICD-10-CM

## 2023-10-11 DIAGNOSIS — I2510 Atherosclerotic heart disease of native coronary artery without angina pectoris: Secondary | ICD-10-CM

## 2023-10-11 MED ORDER — METOPROLOL SUCCINATE ER 25 MG PO TB24
25.0000 mg | ORAL_TABLET | Freq: Every day | ORAL | 3 refills | Status: AC
Start: 2023-10-11 — End: ?

## 2023-10-11 NOTE — Patient Instructions (Addendum)
 Medication Instructions:  Your physician has recommended you make the following change in your medication:   START Metoprolol  Succinate (Toprol -XL) 25 mg once daily    *If you need a refill on your cardiac medications before your next appointment, please call your pharmacy*  Lab Work: None ordered today. If you have labs (blood work) drawn today and your tests are completely normal, you will receive your results only by: MyChart Message (if you have MyChart) OR A paper copy in the mail If you have any lab test that is abnormal or we need to change your treatment, we will call you to review the results.  Testing/Procedures: None ordered today.  Follow-Up: At [facility], you and your health needs are our priority.  As part of our continuing mission to provide you with exceptional heart care, our providers are all part of one team.  This team includes your primary Cardiologist (physician) and Advanced Practice Providers or APPs (Physician Assistants and Nurse Practitioners) who all work together to provide you with the care you need, when you need it.  Your next appointment:   2 month(s)  The format for your next appointment:   In Person  Provider:   Cadence Gennaro Khat, PA-C{  We recommend signing up for the patient portal called "MyChart".  Sign up information is provided on this After Visit Summary.  MyChart is used to connect with patients for Virtual Visits (Telemedicine).  Patients are able to view lab/test results, encounter notes, upcoming appointments, etc.  Non-urgent messages can be sent to your provider as well.   To learn more about what you can do with MyChart, go to [email or web address].   Other Instructions How to Prepare for Your Cardiac PET/CT Stress Test:  1. Please do not take these medications before your test:  ~Medications that may interfere with the cardiac pharmacological stress agent (ex. nitrates - including erectile dysfunction medications,  isosorbide mononitrate, tamulosin or beta-blockers) the day of the exam. (Erectile dysfunction medication should be held for at least 72 hrs prior to test) ~Theophylline containing medications for 12 hours. ~Dipyridamole 48 hours prior to the test. ~Your remaining medications may be taken with water.  2. Nothing to eat or drink, except water, 3 hours prior to arrival time.   ~ NO caffeine/decaffeinated products, or chocolate 12 hours prior to arrival.  3. NO perfume, cologne or lotion on chest or abdomen area.         - FEMALES - Please avoid wearing dresses to this appointment.  4. Total time is 1 to 2 hours; you may want to bring reading material for the waiting time.  Please report to Radiology at the [facility] Main Entrance 30 minutes early for your test. [address] [place], [place] [address]  OR  Please report to Radiology at [facility] Main Entrance, medical mall, 30 mins prior to your test. [address] [place], [place] [phone]  Diabetic Preparation: - Hold oral medications. - You may take NPH and Lantus insulin. - Do not take Humalog or Humulin R (Regular Insulin) the day of your test. - Check blood sugars prior to leaving the house. - If able to eat breakfast prior to 3 hour fasting, you may take all medications, including your insulin, - Do not worry if you miss your breakfast dose of insulin - start at your next meal. - Patients who wear a continuous glucose monitor MUST remove the device prior to scanning.  IF YOU THINK YOU  MAY BE PREGNANT, OR ARE NURSING PLEASE INFORM THE TECHNOLOGIST.  In preparation for your appointment, medication and supplies will be purchased.  Appointment availability is limited, so if you need to cancel or reschedule, please call the Radiology Department at [phone] Maryan Smalling) OR [phone] [facility])  24 hours in advance to avoid a cancellation fee of $100.00  What to Expect After you  Arrive:  Once you arrive and check in for your appointment, you will be taken to a preparation room within the Radiology Department.  A technologist or Nurse will obtain your medical history, verify that you are correctly prepped for the exam, and explain the procedure.  Afterwards,  an IV will be started in your arm and electrodes will be placed on your skin for EKG monitoring during the stress portion of the exam. Then you will be escorted to the PET/CT scanner.  There, staff will get you positioned on the scanner and obtain a blood pressure and EKG.  During the exam, you will continue to be connected to the EKG and blood pressure machines.  A small, safe amount of a radioactive tracer will be injected in your IV to obtain a series of pictures of your heart along with an injection of a stress agent.    After your Exam:  It is recommended that you eat a meal and drink a caffeinated beverage to counter act any effects of the stress agent.  Drink plenty of fluids for the remainder of the day and urinate frequently for the first couple of hours after the exam.  Your doctor will inform you of your test results within 7-10 business days.  For more information and frequently asked questions, please visit our website : [email or web address]  For questions about your test or how to prepare for your test, please call: Cardiac Imaging Nurse Navigators Office: [phone]

## 2023-10-11 NOTE — Progress Notes (Unsigned)
 Cardiology Office Note:  .   Date:  10/13/2023  ID:  Felicia Poole, DOB 08/10/1945, MRN [id number] PCP: Arleen Lacer, MD  [facility] Providers Cardiologist:  Belva Boyden, MD Electrophysiologist:  Boyce Byes, MD {   History of Present Illness: Felicia Poole   Felicia Poole is a 78 y.o. 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    The patient underwent CABG x 1 with LIMA to LAD in 2012 [facility] with resection of atrial myxoma.  She underwent nuclear stress test in March 2014 per Dr. Beau Bound that showed no evidence of ischemia with a normal EF.  Outpatient heart monitor showed frequent episodes of A-fib with RVR with prolonged 3 to 6-second pauses with bradycardia.  She underwent Medtronic PPM placement in September 2014 at [facility].  This is followed by Dr. Rodolfo Clan.  She has been on sotalol  for rhythm control.  She is on warfarin for A-fib.  Echo in April 2021 showed EF of 35 to 40%, mildly decreased LV function, global hypokinesis, low normal RV SF, moderately enlarged RV, moderately elevated pulmonary artery systolic pressure, estimated RV systolic pressure 50 mmHg, mild to moderately dilated left atrium, mild to moderate MR, severe TR.   The patient was last seen 04/2023 and was overall doing well. She was referred to EP for Sotalol  maintenance, but appears she never went. Echo was ordered, but was not performed. She was started on Entresto .    The patient was last seen 07/26/23  and she was referred to EP for PPM follow-up. She was started on spiro. Repeat echo showed reduced LVEF 30-35%, G2DD, dilated atria, mild Mr severe TR. She saw EP, who is planning generator change out with possible CRT upgrade.   Today, echo was reviewed. She denies chest pain or SOB. She took a fluid pill last night for lower leg edema. BP is high but  she just walked in here. On exam, she is tachyarrhythmia.   Studies Reviewed: .       Eco 09/2023 1. Left ventricular ejection fraction, by estimation, is 30 to 35%. The  left ventricle has moderate to severely decreased function. The left  ventricle demonstrates global hypokinesis. Left ventricular diastolic  parameters are consistent with Grade II  diastolic dysfunction (pseudonormalization).   2. Right ventricular systolic function is normal. The right ventricular  size is mildly enlarged. There is normal pulmonary artery systolic  pressure.   3. Left atrial size was severely dilated.   4. Right atrial size was mild to moderately dilated.   5. The mitral valve is normal in structure. Mild mitral valve  regurgitation.   6. The tricuspid valve is abnormal. Tricuspid valve regurgitation is  severe.   7. The aortic valve is tricuspid. Aortic valve regurgitation is not  visualized.   8. The inferior vena cava is dilated in size with <50% respiratory  variability, suggesting right atrial pressure of 15 mmHg.   Inclinic device check 02/2020 Pacemaker check in clinic. Normal device function. Thresholds, sensing, impedances consistent with previous measurements. Device programmed to maximize longevity. 18 high ventricular rates noted. Device programmed at appropriate safety margins. Histogram   distribution appropriate for patient activity level. Device programmed to optimize intrinsic conduction. Estimated longevity 2.5years. Patient continues to decline remote follow-up, ROV with Dr. Rodolfo Clan in 6 months. Patient education completed.Felicia Poole,  BSN, RN    Echo 2021  1. Left ventricular ejection fraction, by estimation, is 35 to 40%. The  left ventricle has moderately decreased function. The left ventricle  demonstrates global hypokinesis. Left ventricular diastolic parameters are  indeterminate.   2. Right ventricular systolic function is low normal. The right  ventricular size is  moderately enlarged. There is moderately elevated  pulmonary artery systolic pressure. The estimated right ventricular  systolic pressure is 50.0 mmHg.   3. Left atrial size was mild to moderately dilated.   4. Mild to moderate mitral valve regurgitation.   5. Tricuspid valve regurgitation is severe.   6. The inferior vena cava is dilated in size with <50% respiratory  variability, suggesting right atrial pressure of 15 mmHg.        Physical Exam:   VS:  BP (!) 156/116   Pulse (!) 106   Ht 4\' 8"  (1.422 m)   Wt 116 lb 9.6 oz (52.9 kg)   SpO2 99%   BMI 26.14 kg/m    Wt Readings from Last 3 Encounters:  10/11/23 116 lb 9.6 oz (52.9 kg)  09/14/23 120 lb 6.4 oz (54.6 kg)  08/31/23 116 lb 9.6 oz (52.9 kg)    GEN: Well nourished, well developed in no acute distress NECK: No JVD; No carotid bruits CARDIAC: tachy, RR, + murmur, rubs, gallops RESPIRATORY:  Clear to auscultation without rales, wheezing or rhonchi  ABDOMEN: Soft, non-tender, non-distended EXTREMITIES:  No edema; No deformity   ASSESSMENT AND PLAN: .    NICM Echo in 2021 showed LVEF 35-40%, severe TR. Repeat echo showed LVEF 30-35%, severe TR. She is euvolemic on exam. She denies anginal symptoms. Unclear reason for reduced EF, she does have h/o CAD. I will order a Cardiac PET stress test. Continue Entresto , Spiro and Farxiga . I will start Toprol  25mg  daily. Plan for generator change out with CRT upgrade with EP later this week. Can also consider re-check echo at follow-up. Plan discussed with Dr. Gollan.   Paroxysmal Afib Tachy-brady syndrome s/p PPM Recent device check showed vpaced A-sensed. 42 episodes of VT longest 14 seconds, 36 fast A&V episodes, longest 23 minutes, 10 AT/AF. Plan for generator change out 79/9/25 with possible CRT upgrade. On exam, she is in Careers adviser. Continue coumadin  for stroke ppx. I will start Toprol  25mg  daily.   CAD s/p CABG x1 She denies anginal symptoms. Continue Lipitor. No ASA given  coumadin .   HTN BP is high on re-check as well. I will add Toprol  as above. Continue Entresto , spiro, and Farxiga . May need to increase Entresto  if BP remains high at follow-up.  HLD LDL 56. Continue Lipitor 80mg  daily.        Dispo: Follow-up in 2 months  Signed, Tyeshia Cornforth Rebekah Canada, PA-C

## 2023-10-13 NOTE — Pre-Procedure Instructions (Signed)
 Attempted to call patient regarding procedure instructions for tomorrow.  Left voicemail on daughter's cell # on the following items: Arrival time 1230 Nothing to eat or drink after midnight No meds AM of procedure Responsible person to drive you home and stay with you for 24 hrs Wash with special soap night before and morning of procedure If on anti-coagulant drug instructions Coumadin - last dose 5/3

## 2023-10-14 ENCOUNTER — Encounter (HOSPITAL_COMMUNITY)
Admission: RE | Disposition: A | Discharge status: Home / Self Care | Payer: Self-pay | Source: Home / Self Care | Attending: Cardiology

## 2023-10-14 ENCOUNTER — Other Ambulatory Visit: Payer: Self-pay

## 2023-10-14 ENCOUNTER — Ambulatory Visit (HOSPITAL_COMMUNITY)
Admission: RE | Admit: 2023-10-14 | Discharge: 2023-10-14 | Disposition: A | Discharge status: Home / Self Care | Attending: Cardiology | Admitting: Cardiology

## 2023-10-14 ENCOUNTER — Encounter: Payer: Self-pay | Admitting: Emergency Medicine

## 2023-10-14 DIAGNOSIS — Z7901 Long term (current) use of anticoagulants: Secondary | ICD-10-CM | POA: Diagnosis not present

## 2023-10-14 DIAGNOSIS — I495 Sick sinus syndrome: Secondary | ICD-10-CM | POA: Insufficient documentation

## 2023-10-14 DIAGNOSIS — Z45010 Encounter for checking and testing of cardiac pacemaker pulse generator [battery]: Secondary | ICD-10-CM | POA: Insufficient documentation

## 2023-10-14 DIAGNOSIS — Z45018 Encounter for adjustment and management of other part of cardiac pacemaker: Secondary | ICD-10-CM

## 2023-10-14 DIAGNOSIS — I4891 Unspecified atrial fibrillation: Secondary | ICD-10-CM

## 2023-10-14 HISTORY — PX: LEAD INSERTION: EP1212

## 2023-10-14 HISTORY — PX: PPM GENERATOR CHANGEOUT: EP1233

## 2023-10-14 LAB — PROTIME-INR
INR: 1.2 (ref 0.8–1.2)
Prothrombin Time: 15.3 s — ABNORMAL HIGH (ref 11.4–15.2)

## 2023-10-14 SURGERY — PPM GENERATOR CHANGEOUT
Anesthesia: LOCAL

## 2023-10-14 MED ORDER — CHLORHEXIDINE GLUCONATE 4 % EX SOLN: 4.0000 | Freq: Once | CUTANEOUS | Status: DC

## 2023-10-14 MED ORDER — GENTAMICIN SULFATE 40 MG/ML IJ SOLN
80.0000 mg | INTRAMUSCULAR | Status: AC
  Administered 2023-10-14: 80 mg

## 2023-10-14 MED ORDER — LIDOCAINE HCL (PF) 1 % IJ SOLN
INTRAMUSCULAR | Status: DC | PRN
  Administered 2023-10-14: 50 mL

## 2023-10-14 MED ORDER — FENTANYL CITRATE (PF) 100 MCG/2ML IJ SOLN
INTRAMUSCULAR | Status: AC
  Filled 2023-10-14: qty 2

## 2023-10-14 MED ORDER — CEFAZOLIN SODIUM-DEXTROSE 2-4 GM/100ML-% IV SOLN
INTRAVENOUS | Status: AC
  Filled 2023-10-14: qty 100

## 2023-10-14 MED ORDER — LIDOCAINE HCL 1 % IJ SOLN
INTRAMUSCULAR | Status: AC
  Filled 2023-10-14: qty 60

## 2023-10-14 MED ORDER — WARFARIN SODIUM 2.5 MG PO TABS: ORAL_TABLET | ORAL | 5 refills | Status: DC

## 2023-10-14 MED ORDER — SODIUM CHLORIDE 0.9 % IV SOLN: INTRAVENOUS | Status: DC

## 2023-10-14 MED ORDER — POVIDONE-IODINE 10 % EX SWAB
2.0000 | Freq: Once | CUTANEOUS | Status: AC
  Administered 2023-10-14: 2 via TOPICAL

## 2023-10-14 MED ORDER — SODIUM CHLORIDE 0.9 % IV SOLN
INTRAVENOUS | Status: AC
  Filled 2023-10-14: qty 2

## 2023-10-14 MED ORDER — CEFAZOLIN SODIUM-DEXTROSE 2-4 GM/100ML-% IV SOLN
2.0000 g | INTRAVENOUS | Status: AC
Start: 2023-10-14 — End: 2023-10-14
  Administered 2023-10-14: 2 g via INTRAVENOUS

## 2023-10-14 MED ORDER — MIDAZOLAM HCL 5 MG/5ML IJ SOLN
INTRAMUSCULAR | Status: AC
  Filled 2023-10-14: qty 5

## 2023-10-14 SURGICAL SUPPLY — 7 items
CABLE SURGICAL S-101-97-12 (CABLE) ×1 IMPLANT
IPG PACE AZUR XT DR MRI W1DR01 (Pacemaker) IMPLANT
PAD DEFIB RADIO PHYSIO CONN (PAD) ×1 IMPLANT
POUCH AIGIS-R ANTIBACT PPM (Mesh General) ×1 IMPLANT
POUCH AIGIS-R ANTIBACT PPM MED (Mesh General) IMPLANT
SHEATH PROBE COVER 6X72 (BAG) IMPLANT
TRAY PACEMAKER INSERTION (PACKS) ×1 IMPLANT

## 2023-10-14 NOTE — Discharge Instructions (Addendum)

## 2023-10-14 NOTE — Interval H&P Note (Signed)
 History and Physical Interval Note:  10/14/2023 3:53 PM  Felicia Poole  has presented today for surgery, with the diagnosis of ERI.  The various methods of treatment have been discussed with the patient and family. After consideration of risks, benefits and other options for treatment, the patient has consented to  Procedure(s): PPM GENERATOR CHANGEOUT (N/A) LEAD INSERTION (N/A) as a surgical intervention.  The patient's history has been reviewed, patient examined, no change in status, stable for surgery.  I have reviewed the patient's chart and labs.  Questions were answered to the patient's satisfaction.     [name] T Sanaya Gwilliam

## 2023-10-14 NOTE — Progress Notes (Signed)
 Patient and daughter was given discharge instructions. Both verbalized understanding.

## 2023-10-15 ENCOUNTER — Encounter (HOSPITAL_COMMUNITY): Payer: Self-pay | Admitting: Cardiology

## 2023-10-19 ENCOUNTER — Encounter: Payer: Self-pay | Admitting: Emergency Medicine

## 2023-10-25 NOTE — Telephone Encounter (Signed)
 err

## 2023-10-26 ENCOUNTER — Ambulatory Visit: Attending: Internal Medicine

## 2023-10-26 DIAGNOSIS — I482 Chronic atrial fibrillation, unspecified: Secondary | ICD-10-CM

## 2023-10-26 DIAGNOSIS — Z5181 Encounter for therapeutic drug level monitoring: Secondary | ICD-10-CM | POA: Diagnosis not present

## 2023-10-26 LAB — POCT INR: INR: 1.2 — AB (ref 2.0–3.0)

## 2023-10-26 NOTE — Patient Instructions (Signed)
 Description   Take 3 tablets  for the next 3 days, then resume same dosage of Warfarin 3 TABLETS DAILY, EXCEPT 2 TABLETS ON MONDAYS, WEDNESDAYS and FRIDAYS.  Recheck in 1-2 weeks.

## 2023-10-27 ENCOUNTER — Ambulatory Visit

## 2023-10-28 ENCOUNTER — Other Ambulatory Visit: Payer: Self-pay | Admitting: Family Medicine

## 2023-10-28 ENCOUNTER — Other Ambulatory Visit: Payer: Self-pay | Admitting: Cardiovascular Disease

## 2023-10-28 DIAGNOSIS — I4891 Unspecified atrial fibrillation: Secondary | ICD-10-CM

## 2023-10-28 DIAGNOSIS — J302 Other seasonal allergic rhinitis: Secondary | ICD-10-CM

## 2023-10-28 NOTE — Telephone Encounter (Signed)
 Warfarin 2.5mg  refill Afib Last INR 10/26/23 Last OV 10/11/23

## 2023-11-08 ENCOUNTER — Encounter: Payer: Self-pay | Admitting: Cardiology

## 2023-11-08 ENCOUNTER — Ambulatory Visit: Attending: Cardiology | Admitting: Cardiology

## 2023-11-08 ENCOUNTER — Ambulatory Visit: Payer: 59 | Admitting: Medical

## 2023-11-08 ENCOUNTER — Encounter (HOSPITAL_COMMUNITY): Payer: Self-pay

## 2023-11-08 VITALS — Ht <= 58 in | Wt 121.6 lb

## 2023-11-08 DIAGNOSIS — I495 Sick sinus syndrome: Secondary | ICD-10-CM | POA: Diagnosis not present

## 2023-11-08 DIAGNOSIS — Z45018 Encounter for adjustment and management of other part of cardiac pacemaker: Secondary | ICD-10-CM

## 2023-11-08 LAB — CUP PACEART INCLINIC DEVICE CHECK
Date Time Interrogation Session: 20250603141447
Implantable Lead Connection Status: 753985
Implantable Lead Connection Status: 753985
Implantable Lead Implant Date: 20140919
Implantable Lead Implant Date: 20140919
Implantable Lead Location: 753859
Implantable Lead Location: 753860
Implantable Pulse Generator Implant Date: 20250509

## 2023-11-08 NOTE — Patient Instructions (Signed)
OK to shower Let warm soapy water run down incision Do not scrub incision Pat dry with towel  Keep incision open to air - no ointments, creams, salves, or bandages.  Call device clinic if incision opens, has drainage, or begins to hurt more.

## 2023-11-08 NOTE — Progress Notes (Signed)
 Wound check appointment.  Steri-strips removed. Wound without redness or edema. Incision edges approximated, wound well healed. Normal device function. Thresholds, sensing, and impedances consistent with implant measurements. Device programmed at auto capture programmed on for extra safety margin until 3 month visit. Histogram distribution appropriate for patient and level of activity. No mode switches or high ventricular rates noted. ROV in 3 months with implanting physician.   She has resumed warfarin.

## 2023-11-09 ENCOUNTER — Telehealth (HOSPITAL_COMMUNITY): Payer: Self-pay | Admitting: *Deleted

## 2023-11-09 ENCOUNTER — Ambulatory Visit: Attending: Cardiovascular Disease

## 2023-11-09 DIAGNOSIS — Z5181 Encounter for therapeutic drug level monitoring: Secondary | ICD-10-CM

## 2023-11-09 DIAGNOSIS — I482 Chronic atrial fibrillation, unspecified: Secondary | ICD-10-CM | POA: Diagnosis not present

## 2023-11-09 LAB — POCT INR: INR: 1.1 — AB (ref 2.0–3.0)

## 2023-11-09 NOTE — Telephone Encounter (Signed)
 Attempted to call patient regarding upcoming cardiac PET appointment. Number on file not valid.  Chase Copping RN Navigator Cardiac Imaging [facility] [phone] Office [phone] Cell

## 2023-11-09 NOTE — Patient Instructions (Signed)
 Increase to 3 tablets daily, except 4 tablets on Monday and Friday.  Recheck in 2 weeks. [phone]

## 2023-11-10 ENCOUNTER — Ambulatory Visit: Admission: RE | Admit: 2023-11-10 | Source: Ambulatory Visit

## 2023-11-23 ENCOUNTER — Ambulatory Visit: Attending: Cardiovascular Disease

## 2023-11-23 DIAGNOSIS — I4891 Unspecified atrial fibrillation: Secondary | ICD-10-CM

## 2023-11-23 DIAGNOSIS — Z5181 Encounter for therapeutic drug level monitoring: Secondary | ICD-10-CM | POA: Diagnosis not present

## 2023-11-23 DIAGNOSIS — I482 Chronic atrial fibrillation, unspecified: Secondary | ICD-10-CM | POA: Diagnosis not present

## 2023-11-23 LAB — POCT INR: INR: 2.5 (ref 2.0–3.0)

## 2023-11-23 MED ORDER — WARFARIN SODIUM 5 MG PO TABS: ORAL_TABLET | ORAL | 1 refills | Status: DC

## 2023-11-23 NOTE — Patient Instructions (Signed)
 Start taking 1 tablet Daily, except 2 tablets every Monday, Wednesday and Friday.  Recheck in 2 weeks. [phone]

## 2023-11-25 ENCOUNTER — Other Ambulatory Visit: Payer: Self-pay | Admitting: Cardiovascular Disease

## 2023-12-06 ENCOUNTER — Encounter (HOSPITAL_COMMUNITY): Payer: Self-pay | Admitting: Cardiology

## 2023-12-06 ENCOUNTER — Other Ambulatory Visit: Payer: Self-pay

## 2023-12-06 ENCOUNTER — Telehealth: Payer: Self-pay | Admitting: Cardiovascular Disease

## 2023-12-06 DIAGNOSIS — I4891 Unspecified atrial fibrillation: Secondary | ICD-10-CM

## 2023-12-06 MED ORDER — WARFARIN SODIUM 5 MG PO TABS: ORAL_TABLET | ORAL | 1 refills | Status: DC

## 2023-12-06 NOTE — Telephone Encounter (Signed)
*  STAT* If patient is at the pharmacy, call can be transferred to refill team.   1. Which medications need to be refilled? (please list name of each medication and dose if known)   warfarin (COUMADIN ) 5 MG tablet   2. Would you like to learn more about the convenience, safety, & potential cost savings by using the [facility]?   3. Are you open to using the [facility] (Type [facility]. ).  4. Which pharmacy/location (including street and city if local pharmacy) is medication to be sent to?  [facility] - [place], [place] - [address]   5. Do they need a 30 day or 90 day supply?   90 day  Kristie noted patient still has some medication left.

## 2023-12-07 ENCOUNTER — Ambulatory Visit: Attending: Cardiovascular Disease

## 2023-12-07 ENCOUNTER — Other Ambulatory Visit: Payer: Self-pay | Admitting: Family Medicine

## 2023-12-07 DIAGNOSIS — J302 Other seasonal allergic rhinitis: Secondary | ICD-10-CM

## 2023-12-07 DIAGNOSIS — I482 Chronic atrial fibrillation, unspecified: Secondary | ICD-10-CM

## 2023-12-07 DIAGNOSIS — Z5181 Encounter for therapeutic drug level monitoring: Secondary | ICD-10-CM | POA: Diagnosis not present

## 2023-12-07 LAB — POCT INR: INR: 1.9 — AB (ref 2.0–3.0)

## 2023-12-07 NOTE — Patient Instructions (Signed)
 Take 2.5 tablets tonight only then continue taking 1 tablet Daily, except 2 tablets every Monday, Wednesday and Friday.  Recheck in 3 weeks. [phone]

## 2023-12-07 NOTE — Telephone Encounter (Signed)
 Copied from CRM [id number]. Topic: Clinical - Medication Refill >> Dec 07, 2023 12:33 PM Rachelle R wrote: Medication: fluticasone  (FLONASE ) 50 MCG/ACT nasal spray  Has the patient contacted their pharmacy? Yes, call dr   This is the patient's preferred pharmacy:  [facility] - [place], [place] - [address] [place] [address] Phone: [phone] Fax: [phone]  Is this the correct pharmacy for this prescription? Yes If no, delete pharmacy and type the correct one.   Has the prescription been filled recently? Yes  Is the patient out of the medication? No  Has the patient been seen for an appointment in the last year OR does the patient have an upcoming appointment? No  Can we respond through MyChart? Yes  Agent: Please be advised that Rx refills may take up to 3 business days. We ask that you follow-up with your pharmacy.

## 2023-12-09 NOTE — Telephone Encounter (Signed)
 Requested medications are due for refill today.  yes  Requested medications are on the active medications list.  yes  Last refill. 12/27/2022 16g 11 rf  Future visit scheduled.   no  Notes to clinic.  Pt last seen 06/29/2022 - pt is more than 3 months overdue for an OV.    Requested Prescriptions  Pending Prescriptions Disp Refills   fluticasone  (FLONASE ) 50 MCG/ACT nasal spray 16 g 11    Sig: Use 2 sprays into each nostril at bedtime     Ear, Nose, and Throat: Nasal Preparations - Corticosteroids Failed - 12/09/2023  4:39 PM      Failed - Valid encounter within last 12 months    Recent Outpatient Visits   None     Future Appointments             In 1 week Furth, Cadence H, PA-C [facility] [employer]

## 2023-12-12 MED ORDER — FLUTICASONE PROPIONATE 50 MCG/ACT NA SUSP: NASAL | 11 refills | Status: AC

## 2023-12-19 ENCOUNTER — Ambulatory Visit: Attending: Medical | Admitting: Medical

## 2023-12-19 NOTE — Progress Notes (Deleted)
  Cardiology Office Note   Date:  12/19/2023  ID:  Felicia Poole, DOB [birth year], MRN [id number] PCP: Glenard Mire, MD  [facility] Providers Cardiologist:  Evalene Lunger, MD Electrophysiologist:  OLE ONEIDA HOLTS, MD { Click to update primary MD,subspecialty MD or APP then REFRESH:1}    History of Present Illness Felicia Poole is a 78 y.o. 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    The patient underwent CABG x 1 with LIMA to LAD in 2012 [facility] with resection of atrial myxoma.  She underwent nuclear stress test in March 2014 per Dr. Florencio that showed no evidence of ischemia with a normal EF.  Outpatient heart monitor showed frequent episodes of A-fib with RVR with prolonged 3 to 6-second pauses with bradycardia.  She underwent Medtronic PPM placement in September 2014 at [facility].  This is followed by Dr. Fernande.  She has been on sotalol  for rhythm control.  She is on warfarin for A-fib.  Echo in April 2021 showed EF of 35 to 40%, mildly decreased LV function, global hypokinesis, low normal RV SF, moderately enlarged RV, moderately elevated pulmonary artery systolic pressure, estimated RV systolic pressure 50 mmHg, mild to moderately dilated left atrium, mild to moderate MR, severe TR.   The patient was last seen 04/2023 and was overall doing well. She was referred to EP for Sotalol  maintenance, but appears she never went. Echo was ordered, but was not performed. She was started on Entresto .    The patient was last seen 07/26/23  and she was referred to EP for PPM follow-up. She was started on spiro. Repeat echo showed reduced LVEF 30-35%, G2DD, dilated atria, mild Mr severe TR. She saw EP, who is planning generator change out with possible CRT upgrade.   The patient was seen 10/11/23 and was overall Ok. She underwent  generator change out 10/14/23.   Today,   ROS: ***  Studies Reviewed      *** Risk Assessment/Calculations {Does this patient have ATRIAL FIBRILLATION?[phone]} No BP recorded.  {Refresh Note OR Click here to enter BP  :1}***       Physical Exam VS:  There were no vitals taken for this visit.       Wt Readings from Last 3 Encounters:  11/08/23 121 lb 9.6 oz (55.2 kg)  10/14/23 117 lb (53.1 kg)  10/11/23 116 lb 9.6 oz (52.9 kg)    GEN: Well nourished, well developed in no acute distress NECK: No JVD; No carotid bruits CARDIAC: ***RRR, no murmurs, rubs, gallops RESPIRATORY:  Clear to auscultation without rales, wheezing or rhonchi  ABDOMEN: Soft, non-tender, non-distended EXTREMITIES:  No edema; No deformity   ASSESSMENT AND PLAN ***    {Are you ordering a CV Procedure (e.g. stress test, cath, DCCV, TEE, etc)?   Press F2        :[id number]}  Dispo: ***  Signed, Charlot Gouin VEAR Fishman, PA-C

## 2023-12-28 ENCOUNTER — Ambulatory Visit: Attending: Cardiovascular Disease

## 2023-12-28 DIAGNOSIS — Z5181 Encounter for therapeutic drug level monitoring: Secondary | ICD-10-CM | POA: Diagnosis not present

## 2023-12-28 DIAGNOSIS — I482 Chronic atrial fibrillation, unspecified: Secondary | ICD-10-CM

## 2023-12-28 LAB — POCT INR: INR: 1 — AB (ref 2.0–3.0)

## 2023-12-28 NOTE — Patient Instructions (Signed)
 Take 3 tablets tonight and 2 tablets Thursday only then continue taking 1 tablet Daily, except 2 tablets every Monday, Wednesday and Friday.  Recheck in 2 weeks. [phone]

## 2024-01-11 ENCOUNTER — Ambulatory Visit: Attending: Cardiovascular Disease

## 2024-01-11 ENCOUNTER — Other Ambulatory Visit: Payer: Self-pay | Admitting: Family Medicine

## 2024-01-11 DIAGNOSIS — E7800 Pure hypercholesterolemia, unspecified: Secondary | ICD-10-CM

## 2024-01-11 DIAGNOSIS — F339 Major depressive disorder, recurrent, unspecified: Secondary | ICD-10-CM

## 2024-01-11 DIAGNOSIS — Z5181 Encounter for therapeutic drug level monitoring: Secondary | ICD-10-CM

## 2024-01-11 DIAGNOSIS — I482 Chronic atrial fibrillation, unspecified: Secondary | ICD-10-CM

## 2024-01-11 DIAGNOSIS — M8000XA Age-related osteoporosis with current pathological fracture, unspecified site, initial encounter for fracture: Secondary | ICD-10-CM

## 2024-01-11 LAB — POCT INR: INR: 2.1 (ref 2.0–3.0)

## 2024-01-11 NOTE — Patient Instructions (Signed)
 Increase to 2 tablets Daily, except 1 tablet every Sunday, Tuesday and Thursday. Recheck in 4 weeks. [phone]

## 2024-01-11 NOTE — Telephone Encounter (Signed)
 Copied from CRM [id number]. Topic: Clinical - Medication Refill >> Jan 11, 2024 11:33 AM Wess RAMAN wrote: Medication: alendronate  (FOSAMAX ) 70 MG tablet  atorvastatin  (LIPITOR) 80 MG tablet  FLUoxetine  (PROZAC ) 20 MG capsule  furosemide  (LASIX ) 20 MG tablet  Has the patient contacted their pharmacy? Yes (Agent: If no, request that the patient contact the pharmacy for the refill. If patient does not wish to contact the pharmacy document the reason why and proceed with request.) (Agent: If yes, when and what did the pharmacy advise?)  This is the patient's preferred pharmacy:   [facility] - [place], [place] - [address] [place] [address] Phone: [phone] Fax: [phone]  Is this the correct pharmacy for this prescription? Yes If no, delete pharmacy and type the correct one.   Has the prescription been filled recently? Yes  Is the patient out of the medication? Yes  Has the patient been seen for an appointment in the last year OR does the patient have an upcoming appointment? Yes  Can we respond through MyChart? Yes  Agent: Please be advised that Rx refills may take up to 3 business days. We ask that you follow-up with your pharmacy.

## 2024-01-18 ENCOUNTER — Other Ambulatory Visit: Payer: Self-pay | Admitting: Cardiovascular Disease

## 2024-01-18 DIAGNOSIS — I4891 Unspecified atrial fibrillation: Secondary | ICD-10-CM

## 2024-01-20 ENCOUNTER — Ambulatory Visit (INDEPENDENT_AMBULATORY_CARE_PROVIDER_SITE_OTHER)

## 2024-01-20 DIAGNOSIS — I4891 Unspecified atrial fibrillation: Secondary | ICD-10-CM | POA: Diagnosis not present

## 2024-01-23 ENCOUNTER — Encounter: Admitting: Cardiology

## 2024-01-24 LAB — CUP PACEART REMOTE DEVICE CHECK
Battery Remaining Longevity: 157 mo
Battery Voltage: 3.21 V
Brady Statistic AP VP Percent: 1.8 %
Brady Statistic AP VS Percent: 82.35 %
Brady Statistic AS VP Percent: 0.62 %
Brady Statistic AS VS Percent: 15.22 %
Brady Statistic RA Percent Paced: 83.82 %
Brady Statistic RV Percent Paced: 2.42 %
Date Time Interrogation Session: 20250815024324
Implantable Lead Connection Status: 753985
Implantable Lead Connection Status: 753985
Implantable Lead Implant Date: 20140919
Implantable Lead Implant Date: 20140919
Implantable Lead Location: 753859
Implantable Lead Location: 753860
Implantable Pulse Generator Implant Date: 20250509
Lead Channel Impedance Value: 323 Ohm
Lead Channel Impedance Value: 361 Ohm
Lead Channel Impedance Value: 380 Ohm
Lead Channel Impedance Value: 456 Ohm
Lead Channel Pacing Threshold Amplitude: 0.625 V
Lead Channel Pacing Threshold Amplitude: 0.875 V
Lead Channel Pacing Threshold Pulse Width: 0.4 ms
Lead Channel Pacing Threshold Pulse Width: 0.4 ms
Lead Channel Sensing Intrinsic Amplitude: 1.25 mV
Lead Channel Sensing Intrinsic Amplitude: 1.25 mV
Lead Channel Sensing Intrinsic Amplitude: 8.875 mV
Lead Channel Sensing Intrinsic Amplitude: 8.875 mV
Lead Channel Setting Pacing Amplitude: 1.5 V
Lead Channel Setting Pacing Amplitude: 2 V
Lead Channel Setting Pacing Pulse Width: 0.4 ms
Lead Channel Setting Sensing Sensitivity: 1.2 mV
Zone Setting Status: 755011

## 2024-01-25 ENCOUNTER — Ambulatory Visit: Admitting: Cardiology

## 2024-01-27 ENCOUNTER — Ambulatory Visit: Payer: Self-pay | Admitting: Cardiology

## 2024-02-07 NOTE — Progress Notes (Unsigned)
  Electrophysiology Office Follow up Visit Note:    Date:  02/08/2024   ID:  Felicia Poole, DOB [birth year], MRN [id number]  PCP:  Glenard Mire, MD  [facility] Cardiologist:  Evalene Lunger, MD  [facility] Electrophysiologist:  OLE ONEIDA HOLTS, MD    Interval History:     Felicia Poole is a 78 y.o. female who presents for a follow up visit.   She had a generator replacement on Oct 14, 2023.  She is doing well today.  She is with her family who I previously met.  Her incision is healed well.      Past medical, surgical, social and family history were reviewed.  ROS:   Please see the history of present illness.    All other systems reviewed and are negative.  EKGs/Labs/Other Studies Reviewed:    The following studies were reviewed today:   February 08, 2024 in-clinic device interrogation personally reviewed Battery and lead parameters stable.       Physical Exam:    VS:  BP (!) 148/100 (BP Location: Left Arm, Patient Position: Sitting, Cuff Size: Normal)   Pulse (!) 58   Ht 4' 8 (1.422 m)   Wt 118 lb 8 oz (53.8 kg)   SpO2 98%   BMI 26.57 kg/m     Wt Readings from Last 3 Encounters:  02/08/24 118 lb 8 oz (53.8 kg)  11/08/23 121 lb 9.6 oz (55.2 kg)  10/14/23 117 lb (53.1 kg)     GEN: no distress.  Elderly CARD: RRR, No MRG.  Pacemaker pocket well-healed RESP: No IWOB. CTAB.      ASSESSMENT:    1. Tachycardia-bradycardia (HCC)   2. Chronic atrial fibrillation (HCC)   3. Pacemaker -Medtronic    PLAN:    In order of problems listed above:  #Symptomatic bradycardia #Permanent pacemaker in situ Doing well after permanent pacemaker generator replacement in May.  #Atrial fibrillation On Coumadin  for stroke prophylaxis  Follow-up 1 year with APP  Signed, OLE HOLTS, MD, [facility], [facility] 02/08/2024 2:57 PM    Electrophysiology [facility]

## 2024-02-08 ENCOUNTER — Encounter: Payer: Self-pay | Admitting: Cardiology

## 2024-02-08 ENCOUNTER — Ambulatory Visit: Admitting: Cardiology

## 2024-02-08 ENCOUNTER — Ambulatory Visit: Attending: Cardiovascular Disease

## 2024-02-08 VITALS — BP 148/100 | HR 58 | Ht <= 58 in | Wt 118.5 lb

## 2024-02-08 DIAGNOSIS — I482 Chronic atrial fibrillation, unspecified: Secondary | ICD-10-CM | POA: Diagnosis not present

## 2024-02-08 DIAGNOSIS — I495 Sick sinus syndrome: Secondary | ICD-10-CM

## 2024-02-08 DIAGNOSIS — Z45018 Encounter for adjustment and management of other part of cardiac pacemaker: Secondary | ICD-10-CM

## 2024-02-08 DIAGNOSIS — Z5181 Encounter for therapeutic drug level monitoring: Secondary | ICD-10-CM | POA: Diagnosis not present

## 2024-02-08 LAB — POCT INR: INR: 1.2 — AB (ref 2.0–3.0)

## 2024-02-08 LAB — CUP PACEART INCLINIC DEVICE CHECK
Date Time Interrogation Session: 20250903154654
Implantable Lead Connection Status: 753985
Implantable Lead Connection Status: 753985
Implantable Lead Implant Date: 20140919
Implantable Lead Implant Date: 20140919
Implantable Lead Location: 753859
Implantable Lead Location: 753860
Implantable Pulse Generator Implant Date: 20250509
Lead Channel Impedance Value: 399 Ohm
Lead Channel Impedance Value: 475 Ohm
Lead Channel Pacing Threshold Amplitude: 0.75 V
Lead Channel Pacing Threshold Amplitude: 1 V
Lead Channel Pacing Threshold Pulse Width: 0.4 ms
Lead Channel Pacing Threshold Pulse Width: 0.4 ms
Lead Channel Sensing Intrinsic Amplitude: 8 mV

## 2024-02-08 NOTE — Patient Instructions (Signed)
 Medication Instructions:  Your physician recommends that you continue on your current medications as directed. Please refer to the Current Medication list given to you today.  *If you need a refill on your cardiac medications before your next appointment, please call your pharmacy*  Follow-Up: At [facility], you and your health needs are our priority.  As part of our continuing mission to provide you with exceptional heart care, our providers are all part of one team.  This team includes your primary Cardiologist (physician) and Advanced Practice Providers or APPs (Physician Assistants and Nurse Practitioners) who all work together to provide you with the care you need, when you need it.  Your next appointment:   1 year  Provider:   You may see Lanier Prude, MD or one of the following Advanced Practice Providers on your designated Care Team:   Francis Dowse, [place] [address]" [place], [place] Sherie Don, NP Canary Brim, NP

## 2024-02-08 NOTE — Patient Instructions (Signed)
 Take 3 tablets today only then continue 2 tablets Daily, except 1 tablet every Sunday, Tuesday and Thursday. Recheck in 2 weeks. [phone]

## 2024-02-11 ENCOUNTER — Ambulatory Visit: Payer: Self-pay | Admitting: Cardiology

## 2024-02-22 ENCOUNTER — Ambulatory Visit: Attending: Cardiovascular Disease

## 2024-02-22 DIAGNOSIS — I482 Chronic atrial fibrillation, unspecified: Secondary | ICD-10-CM

## 2024-02-22 DIAGNOSIS — Z5181 Encounter for therapeutic drug level monitoring: Secondary | ICD-10-CM | POA: Diagnosis not present

## 2024-02-22 LAB — POCT INR: INR: 1.2 — AB (ref 2.0–3.0)

## 2024-02-22 NOTE — Patient Instructions (Signed)
 Take 3 tablets today only then Increase to 2 tablets Daily, except 1 tablet every Tuesday and Thursday. Recheck in 2 weeks. [phone]

## 2024-02-29 NOTE — Progress Notes (Signed)
 Remote PPM Transmission

## 2024-03-07 ENCOUNTER — Ambulatory Visit: Attending: Cardiovascular Disease

## 2024-03-07 DIAGNOSIS — Z5181 Encounter for therapeutic drug level monitoring: Secondary | ICD-10-CM | POA: Diagnosis not present

## 2024-03-07 DIAGNOSIS — I482 Chronic atrial fibrillation, unspecified: Secondary | ICD-10-CM | POA: Diagnosis not present

## 2024-03-07 LAB — POCT INR: INR: 5.8 — AB (ref 2.0–3.0)

## 2024-03-07 NOTE — Patient Instructions (Signed)
 Hold tonight, Thursday and Friday then Decrease to 1 tablet Daily, except 2 tablets every Monday, Wednesday and Friday. Recheck in 2 weeks. [phone]

## 2024-03-21 ENCOUNTER — Ambulatory Visit: Attending: Cardiovascular Disease

## 2024-03-21 DIAGNOSIS — I482 Chronic atrial fibrillation, unspecified: Secondary | ICD-10-CM

## 2024-03-21 DIAGNOSIS — Z5181 Encounter for therapeutic drug level monitoring: Secondary | ICD-10-CM | POA: Diagnosis not present

## 2024-03-21 LAB — POCT INR: INR: 5.3 — AB (ref 2.0–3.0)

## 2024-03-21 NOTE — Patient Instructions (Signed)
 Hold tonight, Thursday, Friday and Saturday then Decrease to 1 tablet Daily. Eat greens tonight. Recheck in 2 weeks. [phone]

## 2024-04-04 ENCOUNTER — Ambulatory Visit: Attending: Cardiovascular Disease

## 2024-04-04 DIAGNOSIS — I482 Chronic atrial fibrillation, unspecified: Secondary | ICD-10-CM

## 2024-04-04 DIAGNOSIS — Z5181 Encounter for therapeutic drug level monitoring: Secondary | ICD-10-CM | POA: Diagnosis not present

## 2024-04-04 LAB — POCT INR: INR: 5.2 — AB (ref 2.0–3.0)

## 2024-04-04 NOTE — Patient Instructions (Signed)
 Hold tonight, Thursday, Friday  then Decrease to 1 tablet Daily, except 0.5 tablet every Monday, Wednesday and Friday.  Eat greens tonight. Recheck in 3 weeks. [phone]

## 2024-04-20 ENCOUNTER — Encounter

## 2024-04-25 ENCOUNTER — Ambulatory Visit: Attending: Cardiovascular Disease

## 2024-04-25 DIAGNOSIS — Z5181 Encounter for therapeutic drug level monitoring: Secondary | ICD-10-CM | POA: Diagnosis not present

## 2024-04-25 DIAGNOSIS — I482 Chronic atrial fibrillation, unspecified: Secondary | ICD-10-CM | POA: Diagnosis not present

## 2024-04-25 LAB — POCT INR: INR: 1.1 — AB (ref 2.0–3.0)

## 2024-04-25 NOTE — Patient Instructions (Signed)
 Take 1.5 tablets today only then continue 1 tablet Daily, except 0.5 tablet every Monday, Wednesday and Friday.   Recheck in 3 weeks. [phone]

## 2024-05-02 ENCOUNTER — Ambulatory Visit: Admitting: Family Medicine

## 2024-05-02 ENCOUNTER — Encounter: Payer: Self-pay | Admitting: Family Medicine

## 2024-05-02 VITALS — BP 142/80 | HR 79 | Resp 16 | Ht <= 58 in | Wt 116.1 lb

## 2024-05-02 DIAGNOSIS — I428 Other cardiomyopathies: Secondary | ICD-10-CM

## 2024-05-02 DIAGNOSIS — Z23 Encounter for immunization: Secondary | ICD-10-CM

## 2024-05-02 DIAGNOSIS — M810 Age-related osteoporosis without current pathological fracture: Secondary | ICD-10-CM

## 2024-05-02 DIAGNOSIS — Z95811 Presence of heart assist device: Secondary | ICD-10-CM

## 2024-05-02 DIAGNOSIS — E7800 Pure hypercholesterolemia, unspecified: Secondary | ICD-10-CM

## 2024-05-02 DIAGNOSIS — I495 Sick sinus syndrome: Secondary | ICD-10-CM

## 2024-05-02 DIAGNOSIS — N1831 Chronic kidney disease, stage 3a: Secondary | ICD-10-CM

## 2024-05-02 DIAGNOSIS — I129 Hypertensive chronic kidney disease with stage 1 through stage 4 chronic kidney disease, or unspecified chronic kidney disease: Secondary | ICD-10-CM

## 2024-05-02 DIAGNOSIS — N3946 Mixed incontinence: Secondary | ICD-10-CM | POA: Insufficient documentation

## 2024-05-02 DIAGNOSIS — I482 Chronic atrial fibrillation, unspecified: Secondary | ICD-10-CM

## 2024-05-02 DIAGNOSIS — E039 Hypothyroidism, unspecified: Secondary | ICD-10-CM

## 2024-05-02 DIAGNOSIS — F339 Major depressive disorder, recurrent, unspecified: Secondary | ICD-10-CM

## 2024-05-02 DIAGNOSIS — I5020 Unspecified systolic (congestive) heart failure: Secondary | ICD-10-CM

## 2024-05-02 LAB — CBC WITH DIFFERENTIAL/PLATELET
Absolute Lymphocytes: 1412 {cells}/uL (ref 850–3900)
Absolute Monocytes: 260 {cells}/uL (ref 200–950)
Basophils Absolute: 22 {cells}/uL (ref 0–200)
Basophils Relative: 0.5 %
Eosinophils Absolute: 70 {cells}/uL (ref 15–500)
Eosinophils Relative: 1.6 %
HCT: 39.7 % (ref 35.9–46.0)
Hemoglobin: 13 g/dL (ref 11.7–15.5)
MCH: 32.7 pg (ref 27.0–33.0)
MCHC: 32.7 g/dL (ref 31.6–35.4)
MCV: 100 fL (ref 81.4–101.7)
MPV: 11.9 fL (ref 7.5–12.5)
Monocytes Relative: 5.9 %
Neutro Abs: 2636 {cells}/uL (ref 1500–7800)
Neutrophils Relative %: 59.9 %
Platelets: 164 Thousand/uL (ref 140–400)
RBC: 3.97 Million/uL (ref 3.80–5.10)
RDW: 11.9 % (ref 11.0–15.0)
Total Lymphocyte: 32.1 %
WBC: 4.4 Thousand/uL (ref 3.8–10.8)

## 2024-05-02 LAB — COMPREHENSIVE METABOLIC PANEL WITH GFR
AG Ratio: 1.8 (calc) (ref 1.0–2.5)
ALT: 15 U/L (ref 6–29)
AST: 23 U/L (ref 10–35)
Albumin: 4.2 g/dL (ref 3.6–5.1)
Alkaline phosphatase (APISO): 108 U/L (ref 37–153)
BUN/Creatinine Ratio: 17 (calc) (ref 6–22)
BUN: 19 mg/dL (ref 7–25)
CO2: 30 mmol/L (ref 20–32)
Calcium: 9.3 mg/dL (ref 8.6–10.4)
Chloride: 106 mmol/L (ref 98–110)
Creat: 1.11 mg/dL — ABNORMAL HIGH (ref 0.60–1.00)
Globulin: 2.4 g/dL (ref 1.9–3.7)
Glucose, Bld: 87 mg/dL (ref 65–99)
Potassium: 4 mmol/L (ref 3.5–5.3)
Sodium: 143 mmol/L (ref 135–146)
Total Bilirubin: 0.4 mg/dL (ref 0.2–1.2)
Total Protein: 6.6 g/dL (ref 6.1–8.1)
eGFR: 51 mL/min/1.73m2 — ABNORMAL LOW (ref >=60)

## 2024-05-02 LAB — VITAMIN D 25 HYDROXY (VIT D DEFICIENCY, FRACTURES): Vit D, 25-Hydroxy: 34 ng/mL (ref 30–100)

## 2024-05-02 LAB — TSH: TSH: 4.48 m[IU]/L (ref 0.40–4.50)

## 2024-05-02 LAB — LIPID PANEL
Cholesterol: 185 mg/dL (ref <200)
HDL: 58 mg/dL (ref >=50)
LDL Cholesterol (Calc): 100 mg/dL — ABNORMAL HIGH
Non-HDL Cholesterol (Calc): 127 mg/dL (ref <130)
Total CHOL/HDL Ratio: 3.2 (calc) (ref <5.0)
Triglycerides: 171 mg/dL — ABNORMAL HIGH (ref <150)

## 2024-05-02 MED ORDER — ALENDRONATE SODIUM 70 MG PO TABS: 70.0000 mg | ORAL_TABLET | ORAL | 3 refills | Status: AC

## 2024-05-02 MED ORDER — FLUOXETINE HCL 20 MG PO CAPS: 20.0000 mg | ORAL_CAPSULE | Freq: Every day | ORAL | 3 refills | Status: AC

## 2024-05-02 MED ORDER — ATORVASTATIN CALCIUM 80 MG PO TABS: 80.0000 mg | ORAL_TABLET | Freq: Every day | ORAL | 3 refills | Status: AC

## 2024-05-02 NOTE — Addendum Note (Signed)
 Addended by: RENTERIA-GARCIA, Dakari Cregger on: 05/02/2024 04:23 PM   Modules accepted: Orders

## 2024-05-02 NOTE — Progress Notes (Signed)
 Name: Felicia Poole   MRN: [id number]    DOB: [birth year]   Date:05/02/2024       Progress Note  Subjective  Chief Complaint  Chief Complaint  Patient presents with   Medical Management of Chronic Issues    Pt has not been compliant with medications   Discussed the use of AI scribe software for clinical note transcription with the patient, who gave verbal consent to proceed.  History of Present Illness Felicia Poole is a 78 year old female with congestive heart failure, atrial fibrillation, and a pacemaker who presents for a one-year follow-up visit.  She underwent a pacemaker battery replacement on Oct 14, 2023, due to battery depletion, involving a PPM generator change out, and it is functioning well. She has bradycardia, tachycardia syndrome and sees cardiologist   She has a history of congestive heart failure with left ventricular systolic dysfunction. She is currently taking furosemide  20 mg, Entresto  24/26 mg twice daily, spironolactone  half a pill daily, and Farxiga . She experiences occasional shortness of breath with exertion and uses three pillows at night due to orthopnea. No swelling, chest pain, or palpitations.  She has chronic atrial fibrillation and is on Coumadin  for anticoagulation. Her INR is monitored regularly by her cardiologist. She also has a history of ischemic cardiomyopathy and takes atorvastatin  for high cholesterol. No current chest pain.  She has chronic kidney disease and hypertension. Her blood pressure is usually well-controlled. No urinary symptoms such as itching or burning.  She has a history of osteoporosis and takes medication for bone health. No recent fractures.  She experiences urinary incontinence, particularly at night, and needs depends - one to two per day .  She has a history of depression and takes fluoxetine , which helps with her mood. She feels down sometimes, especially when thinking about family issues, but denies feeling hopeless.  No  symptoms of hypothyroidism such as hair loss, dry skin, or swallowing difficulties. Her thyroid  function was last checked in November 2024 and was normal. She has not been taking levothyroxine  for months due to loss to follow up  No history of diabetes, although she was noted to have prediabetes in 2022. Her blood sugar levels have been normal since then.    Patient Active Problem List   Diagnosis Date Noted   Presence of heart assist device (HCC) 05/02/2024   Benign hypertension with stage 3a chronic kidney disease (HCC) 05/02/2024   Major depression, recurrent, chronic 04/10/2020   Stage 3a chronic kidney disease (HCC) 04/10/2020   Age-related osteoporosis without current pathological fracture 04/10/2020   Thrombocytopenia 04/10/2020   Angina pectoris 04/11/2017   Senile purpura 04/29/2015   Chronic anticoagulation 04/29/2015   Obesity 12/10/2014   Hypercholesterolemia 10/15/2013   Myoma 10/15/2013   Encounter for therapeutic drug monitoring 08/08/2013   Pacemaker -Medtronic 05/24/2013   Nonischemic cardiomyopathy (HCC)    Atrial fibrillation (HCC) 02/28/2013   Arteriosclerosis of coronary artery 02/22/2013   Adult hypothyroidism 02/22/2013   Congestive heart failure with left ventricular systolic dysfunction (HCC) 02/22/2013   Tachycardia-bradycardia (HCC) 02/22/2013   Essential hypertension, benign    Atrial myxoma    Coronary atherosclerosis     Past Surgical History:  Procedure Laterality Date   ATRIAL MYXOMA EXCISION Left    CARDIAC CATHETERIZATION  2012   [facility]; right and left heart 75% ostial LAD lesion   CORONARY ARTERY BYPASS GRAFT  2011   [facility]   INSERT / REPLACE / REMOVE PACEMAKER  02/2013   Dual chamber MDT  advisa pacemaker. [id number]   LEAD INSERTION N/A 10/14/2023   Procedure: LEAD INSERTION;  Surgeon: Cindie Ole DASEN, MD;  Location: [facility] INVASIVE CV LAB;  Service: Cardiovascular;  Laterality: N/A;   PPM GENERATOR CHANGEOUT N/A 10/14/2023   Procedure: PPM GENERATOR  CHANGEOUT;  Surgeon: Cindie Ole DASEN, MD;  Location: [facility] INVASIVE CV LAB;  Service: Cardiovascular;  Laterality: N/A;   VAGINAL HYSTERECTOMY      Family History  Problem Relation Age of Onset   Heart failure Mother    Cirrhosis Mother    Hyperlipidemia Father    Hypertension Father    Stroke Father    Heart attack Father     Social History   Tobacco Use   Smoking status: Never   Smokeless tobacco: Never   Tobacco comments:    smoking cessation materials not required  Substance Use Topics   Alcohol use: No     Current Outpatient Medications:    alendronate  (FOSAMAX ) 70 MG tablet, Take 1 tablet (70 mg total) by mouth once a week. With full glass of water and do not recline, Disp: 12 tablet, Rfl: 1   atorvastatin  (LIPITOR) 80 MG tablet, Take 1 tablet (80 mg total) by mouth daily., Disp: 90 tablet, Rfl: 1   dapagliflozin  propanediol (FARXIGA ) 10 MG TABS tablet, Take 1 tablet (10 mg total) by mouth daily before breakfast., Disp: 90 tablet, Rfl: 3   FLUoxetine  (PROZAC ) 20 MG capsule, Take 1 capsule (20 mg total) by mouth daily., Disp: 90 capsule, Rfl: 1   fluticasone  (FLONASE ) 50 MCG/ACT nasal spray, Use 2 sprays into each nostril at bedtime, Disp: 16 g, Rfl: 11   furosemide  (LASIX ) 20 MG tablet, Take 1 tablet (20 mg total) by mouth every other day., Disp: 45 tablet, Rfl: 3   Incontinence Supply Disposable (INCONTINENCE BRIEF MEDIUM) MISC, 1 each by Does not apply route 2 (two) times daily., Disp: 100 each, Rfl: 5   levothyroxine  (SYNTHROID ) 50 MCG tablet, Take 1 tablet (50 mcg total) by mouth daily. To last until follow up, Disp: 90 tablet, Rfl: 0   metoprolol  succinate (TOPROL  XL) 25 MG 24 hr tablet, Take 1 tablet (25 mg total) by mouth daily., Disp: 30 tablet, Rfl: 3   sacubitril-valsartan (ENTRESTO ) 24-26 MG, Take 1 tablet by mouth 2 (two) times daily., Disp: 180 tablet, Rfl: 3   spironolactone  (ALDACTONE ) 25 MG tablet, Take 0.5 tablets (12.5 mg total) by mouth daily., Disp: 45  tablet, Rfl: 3   Vitamin D , Ergocalciferol , (DRISDOL ) 1.25 MG (50000 UNIT) CAPS capsule, Take 1 capsule (50,000 Units total) by mouth every 7 (seven) days., Disp: 12 capsule, Rfl: 3   warfarin (COUMADIN ) 5 MG tablet, Take 1 to 2 tablets by mouth daily or as directed by Coumadin  Clinic, Disp: 60 tablet, Rfl: 11  No Known Allergies  I personally reviewed active problem list, medication list, allergies, family history with the patient/caregiver today.   ROS  Ten systems reviewed and is negative except as mentioned in HPI    Objective Physical Exam VITALS: BP- 138/60 CONSTITUTIONAL: Patient appears well-developed and well-nourished.  No distress. HEENT: Head atraumatic, normocephalic, neck supple. CARDIOVASCULAR: Normal rate, regular rhythm and normal heart sounds.  No murmur heard. No BLE edema. PULMONARY: Effort normal and breath sounds normal. No respiratory distress. ABDOMINAL: There is no tenderness or distention. MUSCULOSKELETAL: Normal gait. Without gross motor or sensory deficit. PSYCHIATRIC: Patient has a normal mood and affect. behavior is normal. Judgment and thought content normal.  Vitals:   05/02/24 1510  BP: (!) 154/92  Pulse: 79  Resp: 16  SpO2: 95%  Weight: 116 lb 1.6 oz (52.7 kg)  Height: 4' 8 (1.422 m)    Body mass index is 26.03 kg/m.  Recent Results (from the past 2160 hours)  POCT INR     Status: Abnormal   Collection Time: 02/08/24  2:39 PM  Result Value Ref Range   INR 1.2 (A) 2.0 - 3.0   POC INR    CUP PACEART INCLINIC DEVICE CHECK     Status: None   Collection Time: 02/08/24  3:46 PM  Result Value Ref Range   Pulse Generator Manufacturer MERM    Date Time Interrogation Session [id number]    Pulse Gen Model [id number] Azure XT DR MRI    Pulse Gen Serial Number [id number]    Clinic Name [facility]    Implantable Pulse Generator Type Implantable Pulse Generator    Implantable Pulse Generator Implant Date [id number]    Implantable Lead  Manufacturer MERM    Implantable Lead Model CapSureFix MRI SureScan    Implantable Lead Serial Number [id number]    Implantable Lead Implant Date [id number]    Implantable Lead Location [id number]    Implantable Lead Connection Status [id number]    Implantable Lead Manufacturer MERM    Implantable Lead Model CapSureFix MRI SureScan    Implantable Lead Serial Number [id number]    Implantable Lead Implant Date [id number]    Implantable Lead Location [id number]    Implantable Lead Connection Status [id number]    Lead Channel Impedance Value 399 ohm   Lead Channel Pacing Threshold Amplitude 0.75 V   Lead Channel Pacing Threshold Pulse Width 0.4 ms   Lead Channel Impedance Value 475 ohm   Lead Channel Sensing Intrinsic Amplitude 8.0 mV   Lead Channel Pacing Threshold Amplitude 1.0 V   Lead Channel Pacing Threshold Pulse Width 0.4 ms   Eval Rhythm AP- 30/ VS- 71   POCT INR     Status: Abnormal   Collection Time: 02/22/24  2:43 PM  Result Value Ref Range   INR 1.2 (A) 2.0 - 3.0   POC INR    POCT INR     Status: Abnormal   Collection Time: 03/07/24  3:17 PM  Result Value Ref Range   INR 5.8 (A) 2.0 - 3.0   POC INR    POCT INR     Status: Abnormal   Collection Time: 03/21/24  3:15 PM  Result Value Ref Range   INR 5.3 (A) 2.0 - 3.0   POC INR    POCT INR     Status: Abnormal   Collection Time: 04/04/24  2:56 PM  Result Value Ref Range   INR 5.2 (A) 2.0 - 3.0   POC INR    POCT INR     Status: Abnormal   Collection Time: 04/25/24  3:01 PM  Result Value Ref Range   INR 1.1 (A) 2.0 - 3.0   POC INR      Diabetic Foot Exam:     PHQ2/9:    05/02/2024    3:05 PM 07/29/2022    9:57 AM 06/29/2022    1:32 PM 07/28/2021    8:53 AM 07/21/2021   10:59 AM  Depression screen PHQ 2/9  Decreased Interest 0 2 2 0 0  Down, Depressed, Hopeless 0 2 2 1 1   PHQ - 2 Score 0 4 4 1 1   Altered sleeping 0 1 1 0 0  Tired, decreased energy  0 1 1 0 0  Change in appetite 0 1 1 0 0  Feeling bad or  failure about yourself  0 1 1 0 0  Trouble concentrating 0 1 3 0 0  Moving slowly or fidgety/restless 0 1 1 0 0  Suicidal thoughts 0 0 0 0 0  PHQ-9 Score 0 10  12  1  1    Difficult doing work/chores Not difficult at all Somewhat difficult Somewhat difficult Not difficult at all Not difficult at all     Data saved with a previous flowsheet row definition    phq 9 is negative  Fall Risk:    05/02/2024    3:05 PM 07/29/2022    9:51 AM 06/29/2022    1:31 PM 07/28/2021    8:55 AM 07/21/2021   10:58 AM  Fall Risk   Falls in the past year? 0 0 0 0 0  Number falls in past yr: 0 0  0 0  Injury with Fall? 0 0  0 0  Risk for fall due to : No Fall Risks No Fall Risks No Fall Risks No Fall Risks No Fall Risks  Follow up Falls evaluation completed Education provided;Falls prevention discussed Falls prevention discussed;Education provided;Falls evaluation completed  Falls prevention discussed       Data saved with a previous flowsheet row definition      Assessment & Plan  Discussed importance of flu and pneumonia vaccines due to age and heart condition. Emphasized flu risks in older adults with heart issues. She agreed to flu shot. - Administered flu vaccine. - Recommended pneumonia vaccine. - Advised tetanus vaccine at pharmacy.  Congestive heart failure with left ventricular systolic dysfunction, chronic atrial fibrillation, sick sinus syndrome, and non-ischemic cardiomyopathy status post pacemaker Chronic condition managed by cardiologist. Recent pacemaker battery replacement. No chest pain or palpitations. Occasional shortness of breath and orthopnea. Blood pressure elevated today, usually well-controlled. - Continue current heart failure medications. - Recheck blood pressure in two weeks. - Follow up with cardiologist.  Hypertensive chronic kidney disease, stage 3a Chronic kidney disease stage 3a with hypertension. Blood pressure elevated today. Farxiga  and Entresto  beneficial for  kidney function. - Monitor blood pressure. - Continue current medications.  Pure hypercholesterolemia Managed with atorvastatin . Last cholesterol check in November 2024. - Continue atorvastatin . - Ordered cholesterol panel.  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  Thrombocytopenia Noted in April with low platelet count. - Ordered CBC to recheck platelet count.  Depression Managed with fluoxetine . Reports occasional crying spells related to personal stressors. - Continue fluoxetine .  Hypothyroidism Last thyroid  check in November 2024. - Ordered thyroid  function tests.  Mixed urinary incontinence Chronic urinary incontinence with urgency and occasional nocturnal episodes. -sending Depends rx to pharmacy

## 2024-05-06 ENCOUNTER — Ambulatory Visit: Payer: Self-pay | Admitting: Family Medicine

## 2024-05-16 ENCOUNTER — Ambulatory Visit: Attending: Cardiovascular Disease

## 2024-05-16 DIAGNOSIS — Z5181 Encounter for therapeutic drug level monitoring: Secondary | ICD-10-CM | POA: Diagnosis not present

## 2024-05-16 DIAGNOSIS — I482 Chronic atrial fibrillation, unspecified: Secondary | ICD-10-CM

## 2024-05-16 LAB — POCT INR: INR: 1.1 — AB (ref 2.0–3.0)

## 2024-05-16 NOTE — Patient Instructions (Signed)
 Take 2 tablets tonight only then Increase to 1 tablet Daily. Recheck in 3 weeks. [phone]

## 2024-06-06 ENCOUNTER — Ambulatory Visit

## 2024-06-06 DIAGNOSIS — I482 Chronic atrial fibrillation, unspecified: Secondary | ICD-10-CM

## 2024-06-06 DIAGNOSIS — Z5181 Encounter for therapeutic drug level monitoring: Secondary | ICD-10-CM

## 2024-06-06 LAB — POCT INR: INR: 1 — AB (ref 2.0–3.0)

## 2024-06-06 NOTE — Patient Instructions (Signed)
 Take 2 tablets tonight only then Increase to 1 tablet Daily, except 1.5 tablets every Monday and Friday. Recheck in 2 weeks. [phone]

## 2024-06-20 ENCOUNTER — Ambulatory Visit

## 2024-06-20 DIAGNOSIS — Z5181 Encounter for therapeutic drug level monitoring: Secondary | ICD-10-CM

## 2024-06-20 DIAGNOSIS — I482 Chronic atrial fibrillation, unspecified: Secondary | ICD-10-CM

## 2024-06-20 LAB — POCT INR: INR: 1.1 — AB (ref 2.0–3.0)

## 2024-06-20 NOTE — Patient Instructions (Signed)
 Increase to 1 tablet daily, except 2 tablets every Monday, Wednesday and Friday. Recheck in 3 weeks. [phone]

## 2024-07-11 ENCOUNTER — Ambulatory Visit

## 2024-07-11 DIAGNOSIS — I482 Chronic atrial fibrillation, unspecified: Secondary | ICD-10-CM

## 2024-07-11 DIAGNOSIS — Z5181 Encounter for therapeutic drug level monitoring: Secondary | ICD-10-CM

## 2024-07-11 LAB — POCT INR: INR: 1.5 — AB (ref 2.0–3.0)

## 2024-07-11 NOTE — Patient Instructions (Signed)
 Increase to 2 tablets daily, except 1 tablet every Monday, Wednesday and Friday. Recheck in 3 weeks. [phone]

## 2024-07-20 ENCOUNTER — Encounter

## 2024-08-01 ENCOUNTER — Ambulatory Visit

## 2024-10-19 ENCOUNTER — Encounter

## 2024-10-30 ENCOUNTER — Ambulatory Visit: Admitting: Family Medicine

## 2025-01-18 ENCOUNTER — Encounter

## 2025-04-19 ENCOUNTER — Encounter

## 2025-07-19 ENCOUNTER — Encounter

## 2025-10-18 ENCOUNTER — Encounter
